# Patient Record
Sex: Male | Born: 1950
Health system: Southern US, Community
[De-identification: ages and names within clinical notes are randomized; demographics above are authoritative.]

## PROBLEM LIST (undated history)

## (undated) ENCOUNTER — Emergency Department (HOSPITAL_COMMUNITY): Admission: EM | Payer: Medicare Other | Source: Home / Self Care

## (undated) DIAGNOSIS — K219 Gastro-esophageal reflux disease without esophagitis: Secondary | ICD-10-CM

## (undated) DIAGNOSIS — G4730 Sleep apnea, unspecified: Secondary | ICD-10-CM

## (undated) DIAGNOSIS — I2510 Atherosclerotic heart disease of native coronary artery without angina pectoris: Secondary | ICD-10-CM

## (undated) DIAGNOSIS — I5032 Chronic diastolic (congestive) heart failure: Secondary | ICD-10-CM

## (undated) DIAGNOSIS — I639 Cerebral infarction, unspecified: Secondary | ICD-10-CM

## (undated) DIAGNOSIS — I252 Old myocardial infarction: Secondary | ICD-10-CM

## (undated) DIAGNOSIS — Z8739 Personal history of other diseases of the musculoskeletal system and connective tissue: Secondary | ICD-10-CM

## (undated) DIAGNOSIS — J449 Chronic obstructive pulmonary disease, unspecified: Secondary | ICD-10-CM

## (undated) DIAGNOSIS — M1990 Unspecified osteoarthritis, unspecified site: Secondary | ICD-10-CM

## (undated) DIAGNOSIS — I5042 Chronic combined systolic (congestive) and diastolic (congestive) heart failure: Secondary | ICD-10-CM

## (undated) DIAGNOSIS — E785 Hyperlipidemia, unspecified: Secondary | ICD-10-CM

## (undated) DIAGNOSIS — E119 Type 2 diabetes mellitus without complications: Secondary | ICD-10-CM

## (undated) DIAGNOSIS — J189 Pneumonia, unspecified organism: Secondary | ICD-10-CM

## (undated) DIAGNOSIS — I10 Essential (primary) hypertension: Secondary | ICD-10-CM

## (undated) DIAGNOSIS — Z87442 Personal history of urinary calculi: Secondary | ICD-10-CM

## (undated) DIAGNOSIS — I471 Supraventricular tachycardia, unspecified: Secondary | ICD-10-CM

## (undated) HISTORY — PX: JOINT REPLACEMENT: SHX530

## (undated) HISTORY — DX: Chronic combined systolic (congestive) and diastolic (congestive) heart failure: I50.42

## (undated) HISTORY — PX: TONSILLECTOMY: SUR1361

## (undated) HISTORY — PX: REVISION TOTAL KNEE ARTHROPLASTY: SHX767

## (undated) HISTORY — PX: CATARACT EXTRACTION, BILATERAL: SHX1313

## (undated) HISTORY — PX: ABDOMINAL SURGERY: SHX537

---

## 1995-05-22 DIAGNOSIS — I252 Old myocardial infarction: Secondary | ICD-10-CM

## 1995-05-22 HISTORY — DX: Old myocardial infarction: I25.2

## 1999-12-22 ENCOUNTER — Encounter: Payer: Self-pay | Admitting: Orthopedic Surgery

## 1999-12-22 ENCOUNTER — Ambulatory Visit (HOSPITAL_COMMUNITY): Admission: RE | Admit: 1999-12-22 | Discharge: 1999-12-22 | Payer: Self-pay | Admitting: Orthopedic Surgery

## 2000-03-22 ENCOUNTER — Ambulatory Visit (HOSPITAL_COMMUNITY): Admission: RE | Admit: 2000-03-22 | Discharge: 2000-03-22 | Payer: Self-pay | Admitting: Cardiology

## 2001-12-19 ENCOUNTER — Emergency Department (HOSPITAL_COMMUNITY): Admission: EM | Admit: 2001-12-19 | Discharge: 2001-12-19 | Payer: Self-pay | Admitting: Emergency Medicine

## 2001-12-19 ENCOUNTER — Encounter: Payer: Self-pay | Admitting: Emergency Medicine

## 2002-05-21 HISTORY — PX: CORONARY STENT PLACEMENT: SHX1402

## 2003-02-10 ENCOUNTER — Encounter: Payer: Self-pay | Admitting: Family Medicine

## 2003-02-10 ENCOUNTER — Encounter: Admission: RE | Admit: 2003-02-10 | Discharge: 2003-02-10 | Payer: Self-pay | Admitting: Family Medicine

## 2003-05-12 ENCOUNTER — Encounter: Admission: RE | Admit: 2003-05-12 | Discharge: 2003-05-12 | Payer: Self-pay | Admitting: Orthopedic Surgery

## 2004-09-17 ENCOUNTER — Encounter: Admission: RE | Admit: 2004-09-17 | Discharge: 2004-09-17 | Payer: Self-pay | Admitting: Cardiology

## 2004-10-03 ENCOUNTER — Ambulatory Visit (HOSPITAL_COMMUNITY): Admission: RE | Admit: 2004-10-03 | Discharge: 2004-10-04 | Payer: Self-pay | Admitting: Cardiology

## 2005-04-17 ENCOUNTER — Encounter (HOSPITAL_COMMUNITY): Admission: RE | Admit: 2005-04-17 | Discharge: 2005-07-16 | Payer: Self-pay | Admitting: Cardiology

## 2005-08-02 ENCOUNTER — Inpatient Hospital Stay (HOSPITAL_COMMUNITY): Admission: RE | Admit: 2005-08-02 | Discharge: 2005-08-07 | Payer: Self-pay | Admitting: Orthopedic Surgery

## 2007-09-25 ENCOUNTER — Encounter: Admission: RE | Admit: 2007-09-25 | Discharge: 2007-09-25 | Payer: Self-pay | Admitting: Orthopedic Surgery

## 2008-01-29 ENCOUNTER — Encounter
Admission: RE | Admit: 2008-01-29 | Discharge: 2008-01-30 | Payer: Self-pay | Admitting: Physical Medicine & Rehabilitation

## 2008-01-30 ENCOUNTER — Ambulatory Visit: Payer: Self-pay | Admitting: Physical Medicine & Rehabilitation

## 2008-03-29 ENCOUNTER — Encounter
Admission: RE | Admit: 2008-03-29 | Discharge: 2008-06-15 | Payer: Self-pay | Admitting: Physical Medicine & Rehabilitation

## 2008-03-29 ENCOUNTER — Ambulatory Visit: Payer: Self-pay | Admitting: Physical Medicine & Rehabilitation

## 2008-05-26 ENCOUNTER — Encounter
Admission: RE | Admit: 2008-05-26 | Discharge: 2008-07-19 | Payer: Self-pay | Admitting: Physical Medicine & Rehabilitation

## 2008-05-27 ENCOUNTER — Ambulatory Visit: Payer: Self-pay | Admitting: Physical Medicine & Rehabilitation

## 2008-06-22 ENCOUNTER — Ambulatory Visit: Payer: Self-pay | Admitting: Physical Medicine & Rehabilitation

## 2008-07-19 ENCOUNTER — Encounter
Admission: RE | Admit: 2008-07-19 | Discharge: 2008-10-17 | Payer: Self-pay | Admitting: Physical Medicine & Rehabilitation

## 2008-07-20 ENCOUNTER — Ambulatory Visit: Payer: Self-pay | Admitting: Physical Medicine & Rehabilitation

## 2008-08-19 ENCOUNTER — Ambulatory Visit: Payer: Self-pay | Admitting: Physical Medicine & Rehabilitation

## 2009-11-11 ENCOUNTER — Ambulatory Visit (HOSPITAL_COMMUNITY): Admission: RE | Admit: 2009-11-11 | Discharge: 2009-11-11 | Payer: Self-pay | Admitting: Orthopedic Surgery

## 2010-10-03 NOTE — Assessment & Plan Note (Signed)
James Maxwell returns to the clinic today for followup evaluation.  He  reports that he is getting good relief from his oxycodone, used 3-4  tablets per day.  He generally uses 4 per day.  He reports his blood  pressure medicines have been unchanged as has been all of his other  medicines.  He does need a refill on his oxycodone over the next few  days.  The patient reports that his pain is still interfering with  sleeping, standing, walking, lifting, and all employment, and traveling  along with sitting.   MEDICATIONS:  1. Enteric-coated aspirin 325 mg daily.  2. Plavix 75 mg daily.  3. Pantoprazole 40 mg daily.  4. Atenolol 100 mg daily.  5. Caduet 10/80 one tablet daily.  6. Lisinopril 10/32 one tablet daily.  7. Potassium chloride 20 mEq daily.  8. Maxzide 75/25 one tablet daily.  9. Oxycodone 5 mg 1 tablet q.i.d. p.r.n. (approximately 4 per day).   REVIEW OF SYSTEMS:  Positive for limb swelling.   PHYSICAL EXAMINATION:  GENERAL:  Well-appearing large adult male in mild-  to-no acute discomfort.  VITAL SIGNS:  Blood pressure 143/75 with a pulse of 61, respiratory rate  18, and O2 saturation 95% on room air.  EXTREMITIES:  He has 4+-5+/5 strength throughout the bilateral upper and  lower extremities.  He ambulates with a single-point cane.   IMPRESSION:  1. Chronic low back pain with evidence of degenerative disk disease of      the lumbar spine per MRI study in May 2009.  2. Cervical degenerative disk disease per MRI study in April 2006.  3. Persistent right knee pain, status post right total knee      replacement.  4. Osteoarthritis of the left knee with potential future surgery.   In the office today, we did refill the patient's oxycodone out of  June 03, 2008.  We will plan on seeing him in followup either with  myself or with the nurses in 1 month's time.           ______________________________  Ellwood Dense, M.D.     DC/MedQ  D:  05/27/2008 10:41:16  T:   05/28/2008 00:23:56  Job #:  [id number]

## 2010-10-03 NOTE — Assessment & Plan Note (Signed)
A 60 year old male with lumbar degenerative disk disease.  He has been  evaluated by Neurosurgery, not felt to be a good surgical candidate.  He  has undergone epidural injections.  He has a history of far right disk  protrusion L2-3 contacting extending right L2 nerve root, similar  situation L3-4 contacting right L3 nerve root.  No displacement,  however.  His main pain is axial spine less so really in the legs.  He  has a history of chronic knee pain bilaterally has undergone a right  total knee replacement.  He states that he has been told by Dr. More  that he may need a left side done as well.   His other past history includes gout which is currently not active.  He  has coronary artery disease, status post stenting 2005 of the right  coronary artery and he has cervical spondylosis C6-7.   He has been monitored at this clinic.  No signs of aberrant drug  behavior.  He has signed a controlled substance agreement.  Last urine  drug screen was performed in September 2009 which was interpreted as  appropriate.   PHYSICAL EXAMINATION:  GENERAL:  No acute distress.  Mood and affect  appropriate.  BACK:  Has some tenderness in the L4-5, L5-S1 paraspinal area.  His  lower extremity strength is normal.  He has reduction and knee flexion  bilaterally, right greater than left.  His hip range of motion is only  mildly reduced and the internal rotation of the hip and the ankle range  of motion is full.  There is no evidence of toe drag or knee instability  with ambulation.   IMPRESSION:  Lumbar axial pain.  His degenerative disks are mainly in  the L2-3, L3-4 areas, but his pain is further down in the L4-5, L5-S1  area.  Given his age, I do suspect he may have some facet overlay and we  will check some medial branch blocks after he comes off the Plavix for 5  days and substitute baby aspirin 81 mg per day.   In the meantime, we will continue his oxycodone 5 mg q.i.d.  Should, we  not make  any positive impact on his pain using the medial branch blocks  may need to consider other agents.  I did explain to him that narcotic  analgesics can really reduce pain by only about 30% and on a chronic  basis and they tend to lose efficacy over time, even if other agents are  trialed.   I will see him back for the injection.  Check and urine drug screen  today, repeat.      Erick Colace, M.D.  Electronically Signed     AEK/MedQ  D:  08/19/2008 12:57:17  T:  08/20/2008 01:40:57  Job #:  [id number]   cc:   Cristi Loron, M.D.  Fax: [phone]

## 2010-10-03 NOTE — Group Therapy Note (Signed)
REFERRAL:  Cristi Loron, MD  Bryan Lemma. Manus Gunning, MD  Lenard Galloway Mortenson, MD.   PURPOSE OF EVALUATION:  Evaluate and treat chronic diffuse pain.   HISTORY OF PRESENT ILLNESS:  Mr. Kreitzer is a 60 year old adult male  referred to this office by Dr. Lovell Sheehan for evaluation and treatment of  chronic low back and bilateral knee pain.   The patient reports that his low back pain started approximately in May  of 2005, when he was working for the [employer] and picked up a  large rock out of a [employer].  He was working for the [employer].  He had acute onset of low back pain at that time.  He reports that the pain is sharp in his back and radiates down the back  of his legs into his great toes bilaterally.   In terms of his knee pain, he reports that he has had a right total knee  replacement in March 2007 with Dr. Chaney Malling.  Since that time, he has  had persistent right knee pain.  Dr. Chaney Malling has explained that the  surgery seems to have gone well, but he has pain and that may be related  to his back.  He has gone on to develop left knee pain but is interested  in no surgery for that problem at the present time.  He was told that he  has significant osteoarthritis in the left knee at the present time.  He  reports that he cannot bend his knees very well and has significant  difficulty with walking and has to use a cane.  X-rays had shown  significant left knee osteoarthritis.   The patient reports that he subsequently was referred to Dr. Lovell Sheehan, a  local neurosurgeon.  An MRI scan was done on Sep 25, 2007, of his lumbar  spine which showed right disk protrusion at L2-L3 which contacts the  exiting right L2 nerve root without displacement.  There were also a  right disk protrusion at L3-L4 which contacts the exiting right L3 nerve  root without displacement.   The patient saw Dr. Lovell Sheehan on December 09, 2007.  At that time, Dr. Lovell Sheehan  reviewed the MRI  scan.  No surgery was recommended with followup on an  as-needed basis.  The patient was referred to physical therapy but  reports that he tried it without benefit and actually had increased  pain.  He was taking hydrocodone and Lyrica at that time prescribed by  Dr. Alvester Morin.  Dr. Alvester Morin subsequently had stopped the Lyrica medications  and started him on tramadol/Tylenol at 37.5/325 one tablet daily.  He  has also been previously treated with hydrocodone by Dr. Chaney Malling but  that was discontinued approximately the spring time of 2009.  He had not  gained much relief from the hydrocodone but had tolerated the  medication.   In the past, when he has had his right total knee replacement, he was  taking Percocet or OxyContin from Dr. Chaney Malling and that did give some  relief and benefitted for the 2 weeks or so that he was using that  medication.  He has really used no other pain medicines other than those  listed above.   The patient reports that he did have injections in his back x3,  specifically epidural steroid injections with Dr. Alvester Morin, but those did  not help and the last one occurred approximately July 2009.   PAST MEDICAL HISTORY:  1. History  of gout affecting his left wrist and right great toe.  2. History of myocardial infarction with angioplasty in 1997 and      stenting in 2005.  3. History of mild cervical spondylosis at C6-C7 per MRI study in      April 2006.  4. History of right total knee replacement in March 2007 by Dr.      Chaney Malling with known osteoarthritis of the left knee.  5. Dyslipidemia.  6. Hypertension.  7. Obesity.   ALLERGIES:  No known drug allergies.   FAMILY HISTORY:  His mother died from stroke and heart disease and his  father died from a motor vehicle accident in his 53s.   SOCIAL HISTORY:  The patient is married with 3 children, ages 64, 46,  and a son aged 38.  He is retired from [employer] in 2008 and reports that he  is on disability at the present  time.  He does not use alcohol or tobacco.   MEDICATIONS:  1. Enteric-coated aspirin 325 mg daily.  2. Plavix 75 mg daily.  3. Pantoprazole 40 mg daily.  4. Atenolol 100 mg daily.  5. Caduet 10/80 one tablet daily.  6. Lisinopril 10/30 two tablets daily.  7. Potassium chloride 20 mEq daily.  8. Tramadol 37.5/325 one tablet daily.  9. Nitroglycerin p.r.n.  10.Triamterene/hydrochlorothiazide 75/25 one tablet daily.   REVIEW OF SYSTEMS:  Positive for stiffness of his hands and fingers.   PHYSICAL EXAMINATION:  This is an ill-appearing larger adult male,  height 5 feet 9 inches, and weight 260 pounds.  Blood pressure is 146/71  with pulse of 52, respiratory rate 18, and O2 saturation 98% on room  air.  He ambulates with a single point cane with significant difficulty  including pain in his back and knees.  Upper extremities range of motion  was within functional limits with complaints of pain with shoulder  abduction and flexion.  Lumbar range of motion was decreased in all  planes.  Strength was 4+/5 throughout the bilateral upper extremities  and bilateral lower extremities.  Bulk and tone were normal.  Straight  leg raise was positive to 20 degrees with slightly decreased hip range  of motion bilaterally.  The patient had a well-healed scar on his right  knee.  He did have decreased sensation of the right lateral aspect of  calf.   DIAGNOSES:  1. Chronic low back pain with evidence of degenerative disk disease of      the lumbar spine per MRI study in May 2009.  2. Cervical degenerative disk disease per MRI study in April 2006.  3. Persistent right knee pain, status post right total knee      replacement.  4. Osteoarthritis of the left knee with potential surgery in the      future.   In the office today, we did start the patient on oxycodone 5 mg to be  used 1 tablet q.i.d. p.r.n. total 120 were prescribed.  He understands  that he needs to  get all pain medicines from this office and comply with  all of our regulations regarding narcotic medications.  We did obtain a  urine drug screen in the office today and he denies any street drug use.  He will be calling in for a refill on a monthly basis.  He knows he  needs to give [place] several days notice before refill is required.  He will  need to pick up  the prescription on a monthly basis, if he is not being  seeing  in the office at that time.  We will plan on seeing him in followup in  this office at approximately 2 months' time with refills in 1 month's  time as needed.           ______________________________  Ellwood Dense, M.D.     DC/MedQ  D:  01/30/2008 16:25:12  T:  01/31/2008 06:51:16  Job #:  [id number]

## 2010-10-03 NOTE — Assessment & Plan Note (Signed)
James Maxwell returns to the clinic today for followup evaluation.  I first  and last saw this patient on January 30, 2008 on referral from Dr.  Lovell Sheehan.  The patient is a 59 year old male with history of chronic low  back and bilateral knee pain.  During last clinic visit, we had decide  to try him on oxycodone 5 mg 1 tablet q.i.d. p.r.n.  He apparently was  somewhat confused about the instructions despite showing me the bottle  today.  It is clearly written 4 times a day as needed.  In any event,  the last refill was January 30, 2008, and we are now at March 29, 2008.  He did not call for a refill in October and has tried to stretch  out the 120 tablets for 8 weeks instead of only 4 weeks.  He has a few  remaining, but has been using the oxycodone only 0-4 tablets per day and  only barely 4 tablets.  He has had some increased pain, but certainly he  has not been using the pain medicine as prescribed and is actually  underusing the medicine.   I did go over explicitly the fact that he can use up to 4 tablets per  day and he seems to have a better understanding.  I also explained to  him that we will be filling it today and then the next refill will be  April 26, 2008, i.e., 1 month minus 2 days from today.  He seems to  have a better understanding in the office today.   REVIEW OF SYSTEMS:  Positive for limb swelling.   MEDICATIONS:  1. Enteric-coated aspirin 325 mg daily.  2. Plavix 75 mg daily.  3. Pantoprazole 40 mg daily.  4. Atenolol 100 mg daily.  5. Caduet 10/80 one tablet daily.  6. Lisinopril 10/32 tablets daily.  7. Potassium chloride 20 mEq daily.  8. Triamterene/hydrochlorothiazide 75/25 one tablet daily.  9. Oxycodone 5 mg 1 tablet daily p.r.n. (0-4 per day).   PHYSICAL EXAMINATION:  GENERAL:  Well-appearing, large, adult male in  moderate acute discomfort.  VITAL SIGNS:  Blood pressure 143/79 with pulse 58, respiratory rate 18,  and O2 saturation 98% on room  air.  He has 4+ to 5/5 strength throughout  the bilateral upper and lower extremities.  He ambulates with a single-  point cane.   IMPRESSION:  1. Chronic low back pain with evidence of degenerative disk disease of      the lumbar spine per MRI study, May 2009.  2. Cervical degenerative disk disease per MRI study, April 2006.  3. Persistent right knee pain, status post right total knee      replacement.  4. Osteoarthritis of the left knee with potential future surgery.   In the office today, I again reviewed the pain medication prescription  for him.  I have emphasized that he can use up to 4 tablets per day and  that we will refill this prescription April 26, 2008, approximately 1  month from today.  He seems to have a good understanding and will be  using the medicine appropriately from here on and out.  We will plan on  seeing the patient in followup in approximately 2 months' time with  refills early December 2009 as necessary.          ______________________________  Ellwood Dense, M.D.    DC/MedQ  D:  03/29/2008 15:09:57  T:  03/30/2008 04:31:30  Job #:  [id number] 

## 2010-10-06 NOTE — Discharge Summary (Signed)
NAME:  James Maxwell, James Maxwell                ACCOUNT NO.:  [id number]   MEDICAL RECORD NO.:  [id number]          PATIENT TYPE:  INP   LOCATION:  5033                         FACILITY:  [facility]   PHYSICIAN:  Lenard Galloway. Mortenson, M.D.DATE OF BIRTH:  July 25, 1950   DATE OF ADMISSION:  08/02/2005  DATE OF DISCHARGE:  08/07/2005                                 DISCHARGE SUMMARY   ADMISSION DIAGNOSIS:  Osteoarthritis of the right knee.   DISCHARGE DIAGNOSES:  1.  Osteoarthritis of the right knee.  2.  History of myocardial infarction.  3.  History of hypertension.  4.  Hypo-osmolality.  5.  Hypokalemia.   PROCEDURE:  Right total knee arthroplasty.   HISTORY OF PRESENT ILLNESS:  This is a 60 year old black male with chronic  bilateral knee pain, right greater than left. He had an arthroscopy with a  partial medial meniscectomy and was noted to have osteoarthritis of the  right knee. He has been having difficulty with stairs and activities of  daily living and has essentially failed conservative treatment.  He is now  indicated for right total knee arthroplasty.   HOSPITAL COURSE:  This 60 year old black male admitted August 02, 2005 after  appropriate laboratory studies were obtained, as well as Ancef 1 gram IV on  call to the operating room. He was taken to the operating room where he  underwent a right total knee arthroplasty. He tolerated the procedure well.  He was placed on a full dose Dilaudid PCA pump.  He was started on Arixtra  2.5 mg SQ at 8 p.m. daily. The first shot was given at 9 p.m. on the day of  surgery.  Foot pumps were placed. CPM zero to 90 degrees for 6-8 hours per  day.  Consults with PT, OT and care management were made. Ambulation with  weight bearing as tolerated. A Foley was placed intra-operatively.   He was weaned to oral pain medications on August 03, 2005 and his PCA was  discontinued. OxyContin 620 mg p.o. q.12 hours was begun.  Potassium  chloride 40 mEq p.o. b.i.d.  was given on August 03, 2005.  Daily dressing  change to the knee. He was given potassium 40 mEq on August 06, 2005 and this  did not raise his potassium to an appropriate level. Once his hypokalemia  was corrected he was discharged on August 07, 2005 to return to the office  with Dr. Chaney Malling on the Friday following discharge.   LABORATORY DATA:  Admission hemoglobin 16.1, hematocrit 47%, white count  8600, platelets 305,000. Discharge hemoglobin 10.0, hematocrit 28.9%, white  count 11,100, platelets 289,000. Postoperative sodium was 140, potassium  3.6, chloride 100, CO2 23, glucose 108, BUN 13, creatinine 1.2, calcium 9.5,  total protein 7.6, albumin 4.0. AST 34, ALT 25, ALP 77, total bilirubin 1.1.  Potassium was 2.9 on August 06, 2005 at 1905 Hr.  Discharge sodium was 138,  potassium 3.4, chloride 95, CO2 34, glucose 130, BUN 14, creatinine 1.3,  calcium 8.5. Magnesium was 2.5.  Urinalysis was benign for a voided urine.  Blood type was A positive,  antibody screen negative.   DISCHARGE INSTRUCTIONS:  No restriction of diet. No driving or lifting x6  weeks. Walk with crutches as weight bearing is tolerated. Keep incision  clean and dry. Cover daily with gel, gauze and an ACE wrap. Follow up  instruction sheet.   DISCHARGE MEDICATIONS:  1.  Prescription for Percocet 5/325 1-2 tablets q.4 hours p.r.n. for pain.  2.  OxyContin CR 20 mg 1 tablet q.12 hours p.r.n. for pain.  3.  Arixtra 2.5 mg injected at 8 p.m. daily as instructed.  4.  Once finished with Arixtra, begin Plavix the next day in the evening,      and start his aspirin also.   CPM zero to 90 degrees x6-8 hours per day. He may increase 5-10 degrees  daily. Follow back up with his family doctor to check his potassium this  week following discharge. Follow up with Dr. Chaney Malling on Friday following  discharge.   Discharge condition improved.      James Maxwell, P.A.-C.    ______________________________  Lenard Galloway  Chaney Malling, M.D.    BDP/MEDQ  D:  10/05/2005  T:  10/05/2005  Job:  [id number]

## 2010-10-06 NOTE — Cardiovascular Report (Signed)
NAME:  James Maxwell, CURRENT NO.:  [id number]   MEDICAL RECORD NO.:  [id number]          PATIENT TYPE:  OIB   LOCATION:  6527                         FACILITY:  [facility]   PHYSICIAN:  Francisca December, M.D.  DATE OF BIRTH:  1950/10/11   DATE OF PROCEDURE:  10/03/2004  DATE OF DISCHARGE:                              CARDIAC CATHETERIZATION   PROCEDURES PERFORMED:  1.  Left heart catheterization.  2.  Coronary angiography.  3.  Left ventriculogram.  4.  PCI/stent implantation proximal right coronary.  5.  Percutaneous closure right femoral artery.   INDICATIONS:  James Maxwell is a 60 year old man with known ASCVD who has  previously undergone catheter-based revascularization of the PLB branch off  the RCA in 1998. He underwent cardiac catheterization in 2001 which was felt  to demonstrate nonobstructive disease. He has represented with atypical  angina. A myocardial perfusion study has shown a reversible inferoposterior  wall defect. He is brought to catheterization laboratory at this time to  identify the extent of disease and provide further therapeutic options.   PROCEDURE NOTE:  The patient was brought to the cardiac catheterization  laboratory in the fasting state. The right groin was prepped and draped in  usual sterile fashion. Local anesthesia was obtained with infiltration of 1%  lidocaine. A 5-French catheter sheath was inserted percutaneously into the  right femoral artery utilizing an approach or a guiding J-wire. Left heart  catheterization and coronary angiography proceed in the standard fashion  using 5-French #4 left Judkins, a 5-French AL-1 for the right coronary, and  a 110 cm pigtail catheter for the left ventriculogram. The patient is known  to have an anterior superior origin of the right coronary. At completion of  coronary angiograms, the cines were reviewed and decision was made to  proceed with the percutaneous treatment of a moderate proximal  right  coronary artery stenosis. The 6-French catheter sheath was exchanged over a  long guiding J-wire for 7-French catheter sheath. The patient received 6000  units of heparin intravenously. As well, he received a double bolus of  Integrilin and constant infusion. The resultant ACT was 331 seconds. A 6-  [place] #1 AL guiding catheter was advanced to the ascending aorta where the  right coronary os was engaged. A 0.014 inches Luge intracoronary guide was  passed across the lesion in the right coronary without difficulty. The  lesion was primarily stented using a 4.0 x 16 mm Scimed Liberte  intracoronary stent. This was carefully placed across the lesion and  inflated to peak pressure of 16 atmospheres for approximately one minute.  The stent balloon was deflated, removed and a 4.0 x 8 mm Scimed Quantum  intracoronary balloon was advanced in place. This was inflated twice a bit  more distal and then slightly proximal to peak pressure of 16 atmospheres  for not greater than 40 seconds. Following confirmation of adequate patency  in orthogonal views both with without the guidewire in place, the guiding  catheter was removed. A right femoral arteriogram the 45 degrees RAO  angulation via the catheter sheath  by hand injection was performed and  documented adequate anatomy for placement of percutaneous closure device  AngioSeal. This was successfully deployed with good hemostasis and intact  distal pulse. The patient is transported to recovery area in stable  condition.   HEMODYNAMIC:  Systemic arterial pressure was 133/79 with a mean of 102 mmHg.  There was no systolic gradient across the aortic valve. Left ventricular end-  diastolic pressure was 13 mmHg pre ventriculogram and unchanged post  ventriculogram.   ANGIOGRAPHY:  The left ventriculogram demonstrated normal chamber size and  normal global systolic function without regional wall motion abnormality. A  visual estimate of the  ejection fraction is 60%. There is no mitral  regurgitation. There is no coronary calcification. The aortic valve is  trileaflet opens normally during systole.   There is a right dominant coronary system present. The main left coronary  was normal.   Left anterior descending artery and its branches showed no significant  obstruction. This was a type 1 vessel barely reaching the apex. There was a  30% stenosis in the midportion.   The left circumflex coronary artery and its branches showed the presence of  a 40% narrowing in the proximal portion of the large obtuse marginal branch.  The first and second marginal branches are quite small.   The right coronary in its branches are highly diseased; as mentioned, there  is an anterior and superior origin to the right coronary which was easily  cannulated with a #1 Amplatz left catheter. There was a tubular proximal  stenosis that in the LAO projection appeared to be 50% and in the RAO  projection appeared to be 70% stenotic. The distal vessel shows luminal  irregularities but no obstruction and bifurcates into large posterior  descending artery which perfuses the apex and large posterolateral segment  which gives rise to three small left ventricular branches and then a  moderate size fourth left ventricular branch. There are no obstructions in  the posterior descending artery and there is a 20-30% stenosis in the  posterolateral segment prior to the origin of the fourth ventricular branch  which was the site of previous angioplasty.   Following balloon dilatation and stent implantation in the proximal right  coronary, there was no residual stenosis.   FINAL IMPRESSION:  1.  Atherosclerotic coronary vascular disease, single vessel.  2.  Status post successful percutaneous coronary intervention/stent      implantation bare-metal proximal right coronary. 3.  Typical angina was reproduced with device insertion and balloon      inflation.   4.  Intact ventricular size and global systolic function, ejection fraction      60%.      JHE/MEDQ  D:  10/03/2004  T:  10/03/2004  Job:  [id number]   cc:   Bryan Lemma. Manus Gunning, M.D.  [address] [place]  [place] [address]  Fax: [phone]

## 2010-10-06 NOTE — Op Note (Signed)
NAME:  James Maxwell, James Maxwell                ACCOUNT NO.:  [id number]   MEDICAL RECORD NO.:  [id number]          PATIENT TYPE:  INP   LOCATION:  5033                         FACILITY:  [facility]   PHYSICIAN:  Lenard Galloway. Mortenson, M.D.DATE OF BIRTH:  04-Apr-1951   DATE OF PROCEDURE:  08/02/2005  DATE OF DISCHARGE:                                 OPERATIVE REPORT   PREOPERATIVE DIAGNOSIS:  Severe osteoarthritis, right knee.   POSTOPERATIVE DIAGNOSIS:  Severe osteoarthritis, right knee.   OPERATION:  Total knee replacement on the right using an MBT keel tibial  tray cemented, size 5, with a 12 mm poly insert, and a large right femoral  component cemented, and a large cemented patella.   SURGEON:  Lenard Galloway. Chaney Malling, M.D.   ASSISTANT:  Legrand Pitts. Duffy, P.A.-C.   ANESTHESIA:  General.   PROCEDURE:  The patient was placed on the operating table in supine  position.  A pneumatic tourniquet was placed about the right upper thigh.  The right lower extremity was prepped with DuraPrep and draped out in the  usual manner.  The leg was wrapped out with an Esmarch and the tourniquet  was elevated.  An incision was made starting above the patella and carried  down to the pubic tubercle.  The skin edges were retracted, bleeders were  coagulated. A long medial parapatellar incision was made and the patella was  everted laterally.  Osteophytes off both sides of the femoral condyles were  debrided and over the medial margin of medial tibial plateau.  The patella  was everted.  Both medial and lateral meniscus were excised.  Part of the  fat pad was removed.  Significant osteoarthritic change was seen about the  knee.  The computer array was placed in the femur and the tibia off two  Shands pins.  Registration was then done, first of the femoral head center,  then the tibial mechanical axis, then we defined the proximal tibial plateau  and then defined the distal, femoral condyles, epicondyles, following all  the computer promptings in the standard manner.  At this point, tibia  resection settings were then entered into the computer using the computer  promptings, soft tissue balancing was then done in flexion and extension,  using the soft tissue balancer.  The medial capsule was stripped off, there  was a great deal of varus deformity to the leg.  A ridge of osteophytes were  taken off the medial side of the tibial plateau.  The entire soft tissue  envelope was stripped off the proximal tibia as a single large unit.  This  lined up the tibia with reference to the femur to about 0 degrees.  Soft  tissue balancing was then done in flexion and extension.  At this point,  final implant planning was then done, registered, and femoral resection was  done following all the computer promptings using the appropriate cutting  jigs.  The proximal tibia had been cut previously in the standard manner and  then the tibia subluxed anteriorly.  A size 5 trial fit very nice and this  was  fixed with fixation pins.  The trial was applied and a drill hole was  placed in the proximal tibia.  The wing shaped broach was then used.  The  computer suggested a 12.5 mm implant and a trial implant was inserted.  The  final femoral trial was selected after the distal end of the femur had been  cut in the appropriate manner as noted above.  The femoral trial was put  over this and the knee reduced.  The knee was put through a full range of  motion.  There was good extension, good flexion, good balancing of all the  ligaments and it was felt the soft tissues were balanced very nicely, both  in flexion and extension.  All the trial components were then removed.  Just  prior to removal of all the components, the posterior aspect of the patella  was amputated and a trial patella was inserted.  There was excellent  tracking of the patella.  At this point, all trial components were removed.  Pulse lavage was used and all bony and  soft tissue debris was removed.  Time  was spent making sure there was no extra debris in the wound.  Glue was  mixed and glue was placed on the posterior aspect of all the final  components.  Glue was placed over the proximal end of the tibia and the  tibial component inserted, excess glue was removed.  The final poly was  placed on the tibial tray.  Glue was placed over the distal end of the femur  and over the posterior aspect of the femoral component and the femoral  component was then driven over the distal end of the femur.  Excess glue was  removed.  This fit very nicely.  The knee was then articulated.  With the  knee in extension, all excess glue was removed.  Glue was placed on the  posterior aspect of the patella and this was placed over the cut surface of  the patella and held in place with a clamp.  Excess glue was removed.  Once  the glue had cured, the knee was flexed and excess glue was removed from  around the components with a small osteotome.  Great care was taken to  remove all glue debris.  The tourniquet was dropped and all bleeders were  coagulated.  Hemostasis was achieved.  The knee was irrigated again with  pulse lavage.  A Hemovac drain was placed.  The long median parapatellar  incision was then closed with interrupted Ethibond sutures.  There was  excellent range of motion of the knee.  AP drawer was negative.  Alignment  was very nice.  Vicryl was used to close the subcutaneous tissue and  stainless steel staples were used to close the skin.  Sterile dressings were  applied and the patient returned to the recovery room in excellent  condition.  Technically, this went extremely well.  Drains one Hemovac.  Complications were none.           ______________________________  Lenard Galloway. Chaney Malling, M.D.     RAM/MEDQ  D:  08/02/2005  T:  08/03/2005  Job:  [id number]

## 2011-05-28 DIAGNOSIS — D126 Benign neoplasm of colon, unspecified: Secondary | ICD-10-CM | POA: Diagnosis not present

## 2011-05-28 DIAGNOSIS — Z86010 Personal history of colonic polyps: Secondary | ICD-10-CM | POA: Diagnosis not present

## 2011-05-28 DIAGNOSIS — Z09 Encounter for follow-up examination after completed treatment for conditions other than malignant neoplasm: Secondary | ICD-10-CM | POA: Diagnosis not present

## 2011-05-28 DIAGNOSIS — K5730 Diverticulosis of large intestine without perforation or abscess without bleeding: Secondary | ICD-10-CM | POA: Diagnosis not present

## 2011-07-04 ENCOUNTER — Other Ambulatory Visit (HOSPITAL_COMMUNITY): Payer: Self-pay | Admitting: Orthopedic Surgery

## 2011-07-04 DIAGNOSIS — M25561 Pain in right knee: Secondary | ICD-10-CM

## 2011-07-04 DIAGNOSIS — Z96659 Presence of unspecified artificial knee joint: Secondary | ICD-10-CM

## 2011-07-12 ENCOUNTER — Ambulatory Visit (HOSPITAL_COMMUNITY)
Admission: RE | Admit: 2011-07-12 | Discharge: 2011-07-12 | Disposition: A | Discharge status: Home / Self Care | Payer: Managed Care, Other (non HMO) | Source: Ambulatory Visit | Attending: Orthopedic Surgery | Admitting: Orthopedic Surgery

## 2011-07-12 ENCOUNTER — Encounter (HOSPITAL_COMMUNITY)
Admission: RE | Admit: 2011-07-12 | Discharge: 2011-07-12 | Disposition: A | Discharge status: Home / Self Care | Payer: Managed Care, Other (non HMO) | Source: Ambulatory Visit | Attending: Orthopedic Surgery | Admitting: Orthopedic Surgery

## 2011-07-12 DIAGNOSIS — M25569 Pain in unspecified knee: Secondary | ICD-10-CM | POA: Insufficient documentation

## 2011-07-12 DIAGNOSIS — M25561 Pain in right knee: Secondary | ICD-10-CM

## 2011-07-12 DIAGNOSIS — Z96659 Presence of unspecified artificial knee joint: Secondary | ICD-10-CM

## 2011-07-12 MED ORDER — TECHNETIUM TC 99M MEDRONATE IV KIT
25.0000 | PACK | Freq: Once | INTRAVENOUS | Status: AC | PRN
  Administered 2011-07-12: 25 via INTRAVENOUS

## 2011-07-27 DIAGNOSIS — M1710 Unilateral primary osteoarthritis, unspecified knee: Secondary | ICD-10-CM | POA: Diagnosis not present

## 2011-08-07 DIAGNOSIS — N529 Male erectile dysfunction, unspecified: Secondary | ICD-10-CM | POA: Diagnosis not present

## 2011-09-18 ENCOUNTER — Other Ambulatory Visit: Payer: Self-pay | Admitting: Orthopedic Surgery

## 2011-09-18 DIAGNOSIS — M545 Low back pain, unspecified: Secondary | ICD-10-CM

## 2011-09-21 ENCOUNTER — Ambulatory Visit
Admission: RE | Admit: 2011-09-21 | Discharge: 2011-09-21 | Disposition: A | Discharge status: Home / Self Care | Payer: Managed Care, Other (non HMO) | Source: Ambulatory Visit | Attending: Orthopedic Surgery | Admitting: Orthopedic Surgery

## 2011-09-21 DIAGNOSIS — M169 Osteoarthritis of hip, unspecified: Secondary | ICD-10-CM | POA: Diagnosis not present

## 2011-09-21 DIAGNOSIS — M545 Low back pain, unspecified: Secondary | ICD-10-CM

## 2011-09-21 DIAGNOSIS — M47817 Spondylosis without myelopathy or radiculopathy, lumbosacral region: Secondary | ICD-10-CM | POA: Diagnosis not present

## 2011-09-21 DIAGNOSIS — M76899 Other specified enthesopathies of unspecified lower limb, excluding foot: Secondary | ICD-10-CM | POA: Diagnosis not present

## 2011-09-21 DIAGNOSIS — M25559 Pain in unspecified hip: Secondary | ICD-10-CM | POA: Diagnosis not present

## 2011-09-21 DIAGNOSIS — M25859 Other specified joint disorders, unspecified hip: Secondary | ICD-10-CM | POA: Diagnosis not present

## 2011-09-21 DIAGNOSIS — M5126 Other intervertebral disc displacement, lumbar region: Secondary | ICD-10-CM | POA: Diagnosis not present

## 2011-10-18 DIAGNOSIS — T84099A Other mechanical complication of unspecified internal joint prosthesis, initial encounter: Secondary | ICD-10-CM | POA: Insufficient documentation

## 2011-10-19 DIAGNOSIS — M25569 Pain in unspecified knee: Secondary | ICD-10-CM | POA: Diagnosis not present

## 2011-10-30 DIAGNOSIS — M546 Pain in thoracic spine: Secondary | ICD-10-CM | POA: Diagnosis not present

## 2011-12-11 DIAGNOSIS — L259 Unspecified contact dermatitis, unspecified cause: Secondary | ICD-10-CM | POA: Diagnosis not present

## 2011-12-18 DIAGNOSIS — E669 Obesity, unspecified: Secondary | ICD-10-CM | POA: Diagnosis not present

## 2011-12-18 DIAGNOSIS — I10 Essential (primary) hypertension: Secondary | ICD-10-CM | POA: Diagnosis not present

## 2011-12-18 DIAGNOSIS — I2510 Atherosclerotic heart disease of native coronary artery without angina pectoris: Secondary | ICD-10-CM | POA: Diagnosis not present

## 2012-03-31 DIAGNOSIS — M25569 Pain in unspecified knee: Secondary | ICD-10-CM | POA: Diagnosis not present

## 2012-03-31 DIAGNOSIS — I2510 Atherosclerotic heart disease of native coronary artery without angina pectoris: Secondary | ICD-10-CM | POA: Diagnosis not present

## 2012-03-31 DIAGNOSIS — Z23 Encounter for immunization: Secondary | ICD-10-CM | POA: Diagnosis not present

## 2012-03-31 DIAGNOSIS — K219 Gastro-esophageal reflux disease without esophagitis: Secondary | ICD-10-CM | POA: Diagnosis not present

## 2012-03-31 DIAGNOSIS — N529 Male erectile dysfunction, unspecified: Secondary | ICD-10-CM | POA: Diagnosis not present

## 2012-03-31 DIAGNOSIS — I10 Essential (primary) hypertension: Secondary | ICD-10-CM | POA: Diagnosis not present

## 2012-03-31 DIAGNOSIS — E119 Type 2 diabetes mellitus without complications: Secondary | ICD-10-CM | POA: Diagnosis not present

## 2012-04-03 DIAGNOSIS — Z96659 Presence of unspecified artificial knee joint: Secondary | ICD-10-CM | POA: Diagnosis not present

## 2012-04-03 DIAGNOSIS — M25569 Pain in unspecified knee: Secondary | ICD-10-CM | POA: Diagnosis not present

## 2012-04-10 DIAGNOSIS — M25569 Pain in unspecified knee: Secondary | ICD-10-CM | POA: Diagnosis not present

## 2012-05-26 DIAGNOSIS — M1710 Unilateral primary osteoarthritis, unspecified knee: Secondary | ICD-10-CM | POA: Diagnosis not present

## 2012-05-26 DIAGNOSIS — I2510 Atherosclerotic heart disease of native coronary artery without angina pectoris: Secondary | ICD-10-CM | POA: Diagnosis not present

## 2012-05-26 DIAGNOSIS — K219 Gastro-esophageal reflux disease without esophagitis: Secondary | ICD-10-CM | POA: Diagnosis not present

## 2012-05-26 DIAGNOSIS — E119 Type 2 diabetes mellitus without complications: Secondary | ICD-10-CM | POA: Diagnosis not present

## 2012-05-26 DIAGNOSIS — Z01818 Encounter for other preprocedural examination: Secondary | ICD-10-CM | POA: Diagnosis not present

## 2012-05-26 DIAGNOSIS — I10 Essential (primary) hypertension: Secondary | ICD-10-CM | POA: Diagnosis not present

## 2012-05-26 DIAGNOSIS — E669 Obesity, unspecified: Secondary | ICD-10-CM | POA: Diagnosis not present

## 2012-06-04 DIAGNOSIS — E119 Type 2 diabetes mellitus without complications: Secondary | ICD-10-CM | POA: Diagnosis present

## 2012-06-04 DIAGNOSIS — M25569 Pain in unspecified knee: Secondary | ICD-10-CM | POA: Diagnosis present

## 2012-06-04 DIAGNOSIS — K219 Gastro-esophageal reflux disease without esophagitis: Secondary | ICD-10-CM | POA: Diagnosis present

## 2012-06-04 DIAGNOSIS — E785 Hyperlipidemia, unspecified: Secondary | ICD-10-CM | POA: Diagnosis present

## 2012-06-04 DIAGNOSIS — M1710 Unilateral primary osteoarthritis, unspecified knee: Secondary | ICD-10-CM | POA: Diagnosis not present

## 2012-06-04 DIAGNOSIS — T84039A Mechanical loosening of unspecified internal prosthetic joint, initial encounter: Secondary | ICD-10-CM | POA: Diagnosis not present

## 2012-06-04 DIAGNOSIS — Z6838 Body mass index (BMI) 38.0-38.9, adult: Secondary | ICD-10-CM | POA: Diagnosis not present

## 2012-06-04 DIAGNOSIS — Z96659 Presence of unspecified artificial knee joint: Secondary | ICD-10-CM | POA: Diagnosis not present

## 2012-06-04 DIAGNOSIS — Z9861 Coronary angioplasty status: Secondary | ICD-10-CM | POA: Diagnosis not present

## 2012-06-04 DIAGNOSIS — T8489XA Other specified complication of internal orthopedic prosthetic devices, implants and grafts, initial encounter: Secondary | ICD-10-CM | POA: Diagnosis present

## 2012-06-04 DIAGNOSIS — I10 Essential (primary) hypertension: Secondary | ICD-10-CM | POA: Diagnosis present

## 2012-06-04 DIAGNOSIS — E669 Obesity, unspecified: Secondary | ICD-10-CM | POA: Diagnosis present

## 2012-06-04 DIAGNOSIS — I2510 Atherosclerotic heart disease of native coronary artery without angina pectoris: Secondary | ICD-10-CM | POA: Diagnosis present

## 2012-06-04 DIAGNOSIS — Z472 Encounter for removal of internal fixation device: Secondary | ICD-10-CM | POA: Diagnosis not present

## 2012-06-04 DIAGNOSIS — T84099A Other mechanical complication of unspecified internal joint prosthesis, initial encounter: Secondary | ICD-10-CM | POA: Diagnosis not present

## 2012-06-23 DIAGNOSIS — T84039A Mechanical loosening of unspecified internal prosthetic joint, initial encounter: Secondary | ICD-10-CM | POA: Insufficient documentation

## 2012-07-21 DIAGNOSIS — M25569 Pain in unspecified knee: Secondary | ICD-10-CM | POA: Diagnosis not present

## 2012-07-21 DIAGNOSIS — Z5189 Encounter for other specified aftercare: Secondary | ICD-10-CM | POA: Diagnosis not present

## 2012-08-24 ENCOUNTER — Emergency Department (HOSPITAL_COMMUNITY): Payer: Managed Care, Other (non HMO)

## 2012-08-24 ENCOUNTER — Encounter (HOSPITAL_COMMUNITY): Payer: Self-pay | Admitting: Emergency Medicine

## 2012-08-24 ENCOUNTER — Emergency Department (HOSPITAL_COMMUNITY)
Admission: EM | Admit: 2012-08-24 | Discharge: 2012-08-24 | Disposition: A | Discharge status: Home / Self Care | Payer: Managed Care, Other (non HMO) | Attending: Emergency Medicine | Admitting: Emergency Medicine

## 2012-08-24 DIAGNOSIS — M48061 Spinal stenosis, lumbar region without neurogenic claudication: Secondary | ICD-10-CM | POA: Diagnosis not present

## 2012-08-24 DIAGNOSIS — M549 Dorsalgia, unspecified: Secondary | ICD-10-CM | POA: Insufficient documentation

## 2012-08-24 DIAGNOSIS — I509 Heart failure, unspecified: Secondary | ICD-10-CM | POA: Insufficient documentation

## 2012-08-24 DIAGNOSIS — N3941 Urge incontinence: Secondary | ICD-10-CM | POA: Diagnosis not present

## 2012-08-24 DIAGNOSIS — Z7902 Long term (current) use of antithrombotics/antiplatelets: Secondary | ICD-10-CM | POA: Diagnosis not present

## 2012-08-24 DIAGNOSIS — Z7982 Long term (current) use of aspirin: Secondary | ICD-10-CM | POA: Diagnosis not present

## 2012-08-24 DIAGNOSIS — I10 Essential (primary) hypertension: Secondary | ICD-10-CM | POA: Insufficient documentation

## 2012-08-24 DIAGNOSIS — M4800 Spinal stenosis, site unspecified: Secondary | ICD-10-CM | POA: Diagnosis not present

## 2012-08-24 DIAGNOSIS — M25559 Pain in unspecified hip: Secondary | ICD-10-CM | POA: Insufficient documentation

## 2012-08-24 DIAGNOSIS — M129 Arthropathy, unspecified: Secondary | ICD-10-CM | POA: Insufficient documentation

## 2012-08-24 DIAGNOSIS — Z9861 Coronary angioplasty status: Secondary | ICD-10-CM | POA: Diagnosis not present

## 2012-08-24 DIAGNOSIS — Z79899 Other long term (current) drug therapy: Secondary | ICD-10-CM | POA: Insufficient documentation

## 2012-08-24 DIAGNOSIS — E119 Type 2 diabetes mellitus without complications: Secondary | ICD-10-CM | POA: Diagnosis not present

## 2012-08-24 DIAGNOSIS — I252 Old myocardial infarction: Secondary | ICD-10-CM | POA: Insufficient documentation

## 2012-08-24 HISTORY — DX: Type 2 diabetes mellitus without complications: E11.9

## 2012-08-24 HISTORY — DX: Old myocardial infarction: I25.2

## 2012-08-24 HISTORY — DX: Unspecified osteoarthritis, unspecified site: M19.90

## 2012-08-24 HISTORY — DX: Essential (primary) hypertension: I10

## 2012-08-24 MED ORDER — HYDROMORPHONE HCL PF 1 MG/ML IJ SOLN
1.0000 mg | Freq: Once | INTRAMUSCULAR | Status: AC
  Administered 2012-08-24: 1 mg via INTRAVENOUS
  Filled 2012-08-24: qty 1

## 2012-08-24 MED ORDER — OXYCODONE-ACETAMINOPHEN 7.5-325 MG PO TABS: 1.0000 | ORAL_TABLET | ORAL | Status: DC | PRN

## 2012-08-24 MED ORDER — HYDROMORPHONE HCL PF 1 MG/ML IJ SOLN: 1.0000 mg | Freq: Once | INTRAMUSCULAR | Status: DC

## 2012-08-24 MED ORDER — HYDROMORPHONE HCL PF 2 MG/ML IJ SOLN
INTRAMUSCULAR | Status: AC
  Administered 2012-08-24: 1 mg
  Filled 2012-08-24: qty 1

## 2012-08-24 MED ORDER — ONDANSETRON HCL 4 MG/2ML IJ SOLN
4.0000 mg | Freq: Once | INTRAMUSCULAR | Status: AC
  Administered 2012-08-24: 4 mg via INTRAVENOUS
  Filled 2012-08-24: qty 2

## 2012-08-24 NOTE — ED Provider Notes (Signed)
History     CSN: [id number]  Arrival date & time 08/24/12  5621   First MD Initiated Contact with Patient 08/24/12 1900      Chief Complaint  Patient presents with  . Hip Pain    bilat hip pain radiating down both legs to knees  . Leg Pain  . Urinary Urgency    Feels like he needs to urinate frequently     HPI 3 day hx of increasing low back, hip and bilat. leg pain.  Patient denies urinary incontinence or retention.  Patient denies numbness or muscle weakness.  Review of patient's previous MRI scan less than a year ago showed spinal stenosis and broad-based herniated disc.  Patient has not seen him back physician for several years.  Past Medical History  Diagnosis Date  . Hypertension   . CHF (congestive heart failure)   . Diabetes mellitus without complication   . Arthritis   . MI, old     Past Surgical History  Procedure Laterality Date  . Coronary stent placement    . Revision total knee arthroplasty    . Tonsillectomy    . Abdominal surgery      Family History  Problem Relation Age of Onset  . Diabetes Mother   . Hypertension Mother     History  Substance Use Topics  . Smoking status: Never Smoker   . Smokeless tobacco: Not on file  . Alcohol Use: No      Review of Systems  All other systems reviewed and are negative.    Allergies  Review of patient's allergies indicates no known allergies.  Home Medications   Current Outpatient Rx  Name  Route  Sig  Dispense  Refill  . amLODipine-atorvastatin (CADUET) 10-80 MG per tablet   Oral   Take 1 tablet by mouth daily.         [name] aspirin EC 325 MG tablet   Oral   Take 325 mg by mouth daily.         [name] atenolol (TENORMIN) 100 MG tablet   Oral   Take 100 mg by mouth daily.         . clopidogrel (PLAVIX) 75 MG tablet   Oral   Take 75 mg by mouth daily.         [name] glimepiride (AMARYL) 2 MG tablet   Oral   Take 2 mg by mouth daily before breakfast.         . lisinopril  (PRINIVIL,ZESTRIL) 10 MG tablet   Oral   Take 10 mg by mouth daily.         . metFORMIN (GLUMETZA) 500 MG (MOD) 24 hr tablet   Oral   Take 1,000 mg by mouth 2 (two) times daily with a meal.         . pantoprazole (PROTONIX) 40 MG tablet   Oral   Take 40 mg by mouth daily.         . potassium chloride SA (K-DUR,KLOR-CON) 20 MEQ tablet   Oral   Take 20 mEq by mouth daily.         [name] oxyCODONE-acetaminophen (PERCOCET) 7.5-325 MG per tablet   Oral   Take 1 tablet by mouth every 4 (four) hours as needed for pain.   30 tablet   0     BP 131/62  Pulse 72  Temp(Src) 97.3 F (36.3 C) (Oral)  Resp 18  SpO2 95%  Physical Exam  Nursing note and vitals reviewed.  Constitutional: He is oriented to person, place, and time. He appears well-developed and well-nourished. No distress.  HENT:  Head: Normocephalic and atraumatic.  Eyes: Pupils are equal, round, and reactive to light.  Neck: Normal range of motion.  Cardiovascular: Normal rate and intact distal pulses.   Pulmonary/Chest: No respiratory distress.  Abdominal: Normal appearance. He exhibits no distension.  Musculoskeletal:       Lumbar back: He exhibits decreased range of motion, tenderness and pain. He exhibits no swelling and no deformity.       Back:  Neurological: He is alert and oriented to person, place, and time. No cranial nerve deficit.  Skin: Skin is warm and dry. No rash noted.  Psychiatric: He has a normal mood and affect. His behavior is normal.    ED Course  Procedures (including critical care time) Meds ordered this encounter  Medications  . HYDROmorphone (DILAUDID) injection 1 mg    Sig:   . ondansetron (ZOFRAN) injection 4 mg    Sig:   . oxyCODONE-acetaminophen (PERCOCET) 7.5-325 MG per tablet    Sig: Take 1 tablet by mouth every 4 (four) hours as needed for pain.    Dispense:  30 tablet    Refill:  0     Labs Reviewed - No data to display No results found.   1. Spinal stenosis        MDM  I expect his back problems or result of his spinal stenosis along with his inability to walk normally because of his abnormal knee replacements.       Nelia Shi, MD 08/27/12 [phone]

## 2012-08-24 NOTE — ED Notes (Signed)
3 day hx of increasing low back, hip and bilat. leg pain. Reports frequency and urgency on urination last 24 hrs

## 2012-09-09 DIAGNOSIS — M545 Low back pain: Secondary | ICD-10-CM | POA: Diagnosis not present

## 2012-09-09 DIAGNOSIS — IMO0002 Reserved for concepts with insufficient information to code with codable children: Secondary | ICD-10-CM | POA: Diagnosis not present

## 2012-09-16 DIAGNOSIS — IMO0002 Reserved for concepts with insufficient information to code with codable children: Secondary | ICD-10-CM | POA: Diagnosis not present

## 2012-09-16 DIAGNOSIS — M545 Low back pain: Secondary | ICD-10-CM | POA: Diagnosis not present

## 2012-09-18 DIAGNOSIS — M545 Low back pain: Secondary | ICD-10-CM | POA: Diagnosis not present

## 2012-09-18 DIAGNOSIS — IMO0002 Reserved for concepts with insufficient information to code with codable children: Secondary | ICD-10-CM | POA: Diagnosis not present

## 2012-09-19 DIAGNOSIS — IMO0002 Reserved for concepts with insufficient information to code with codable children: Secondary | ICD-10-CM | POA: Diagnosis not present

## 2012-09-19 DIAGNOSIS — M545 Low back pain: Secondary | ICD-10-CM | POA: Diagnosis not present

## 2012-09-23 DIAGNOSIS — IMO0002 Reserved for concepts with insufficient information to code with codable children: Secondary | ICD-10-CM | POA: Diagnosis not present

## 2012-09-23 DIAGNOSIS — M545 Low back pain: Secondary | ICD-10-CM | POA: Diagnosis not present

## 2012-09-25 DIAGNOSIS — M545 Low back pain: Secondary | ICD-10-CM | POA: Diagnosis not present

## 2012-09-25 DIAGNOSIS — IMO0002 Reserved for concepts with insufficient information to code with codable children: Secondary | ICD-10-CM | POA: Diagnosis not present

## 2012-09-29 DIAGNOSIS — IMO0002 Reserved for concepts with insufficient information to code with codable children: Secondary | ICD-10-CM | POA: Diagnosis not present

## 2012-09-29 DIAGNOSIS — M545 Low back pain: Secondary | ICD-10-CM | POA: Diagnosis not present

## 2012-09-30 DIAGNOSIS — M545 Low back pain: Secondary | ICD-10-CM | POA: Diagnosis not present

## 2012-09-30 DIAGNOSIS — IMO0002 Reserved for concepts with insufficient information to code with codable children: Secondary | ICD-10-CM | POA: Diagnosis not present

## 2012-10-02 DIAGNOSIS — R42 Dizziness and giddiness: Secondary | ICD-10-CM | POA: Diagnosis not present

## 2012-10-02 DIAGNOSIS — M533 Sacrococcygeal disorders, not elsewhere classified: Secondary | ICD-10-CM | POA: Diagnosis not present

## 2012-10-02 DIAGNOSIS — G541 Lumbosacral plexus disorders: Secondary | ICD-10-CM | POA: Diagnosis not present

## 2012-10-02 DIAGNOSIS — M542 Cervicalgia: Secondary | ICD-10-CM | POA: Diagnosis not present

## 2012-10-02 DIAGNOSIS — H81399 Other peripheral vertigo, unspecified ear: Secondary | ICD-10-CM | POA: Diagnosis not present

## 2012-10-02 DIAGNOSIS — M545 Low back pain: Secondary | ICD-10-CM | POA: Diagnosis not present

## 2012-10-02 DIAGNOSIS — R209 Unspecified disturbances of skin sensation: Secondary | ICD-10-CM | POA: Diagnosis not present

## 2012-10-02 DIAGNOSIS — H8190 Unspecified disorder of vestibular function, unspecified ear: Secondary | ICD-10-CM | POA: Diagnosis not present

## 2012-10-02 DIAGNOSIS — M629 Disorder of muscle, unspecified: Secondary | ICD-10-CM | POA: Diagnosis not present

## 2012-10-07 DIAGNOSIS — M545 Low back pain: Secondary | ICD-10-CM | POA: Diagnosis not present

## 2012-10-07 DIAGNOSIS — IMO0002 Reserved for concepts with insufficient information to code with codable children: Secondary | ICD-10-CM | POA: Diagnosis not present

## 2012-10-09 DIAGNOSIS — IMO0002 Reserved for concepts with insufficient information to code with codable children: Secondary | ICD-10-CM | POA: Diagnosis not present

## 2012-10-09 DIAGNOSIS — M545 Low back pain: Secondary | ICD-10-CM | POA: Diagnosis not present

## 2012-10-10 DIAGNOSIS — M545 Low back pain: Secondary | ICD-10-CM | POA: Diagnosis not present

## 2012-10-10 DIAGNOSIS — IMO0002 Reserved for concepts with insufficient information to code with codable children: Secondary | ICD-10-CM | POA: Diagnosis not present

## 2012-10-17 DIAGNOSIS — IMO0002 Reserved for concepts with insufficient information to code with codable children: Secondary | ICD-10-CM | POA: Diagnosis not present

## 2012-10-17 DIAGNOSIS — M545 Low back pain: Secondary | ICD-10-CM | POA: Diagnosis not present

## 2012-10-20 DIAGNOSIS — Z79899 Other long term (current) drug therapy: Secondary | ICD-10-CM | POA: Diagnosis not present

## 2012-10-20 DIAGNOSIS — H8190 Unspecified disorder of vestibular function, unspecified ear: Secondary | ICD-10-CM | POA: Diagnosis not present

## 2012-10-20 DIAGNOSIS — G541 Lumbosacral plexus disorders: Secondary | ICD-10-CM | POA: Diagnosis not present

## 2012-10-20 DIAGNOSIS — M545 Low back pain: Secondary | ICD-10-CM | POA: Diagnosis not present

## 2012-10-20 DIAGNOSIS — H81399 Other peripheral vertigo, unspecified ear: Secondary | ICD-10-CM | POA: Diagnosis not present

## 2012-10-21 DIAGNOSIS — M545 Low back pain: Secondary | ICD-10-CM | POA: Diagnosis not present

## 2012-10-21 DIAGNOSIS — IMO0002 Reserved for concepts with insufficient information to code with codable children: Secondary | ICD-10-CM | POA: Diagnosis not present

## 2012-10-22 DIAGNOSIS — M545 Low back pain: Secondary | ICD-10-CM | POA: Diagnosis not present

## 2012-10-22 DIAGNOSIS — IMO0002 Reserved for concepts with insufficient information to code with codable children: Secondary | ICD-10-CM | POA: Diagnosis not present

## 2012-10-23 DIAGNOSIS — M545 Low back pain: Secondary | ICD-10-CM | POA: Diagnosis not present

## 2012-10-23 DIAGNOSIS — IMO0002 Reserved for concepts with insufficient information to code with codable children: Secondary | ICD-10-CM | POA: Diagnosis not present

## 2013-01-29 DIAGNOSIS — Z23 Encounter for immunization: Secondary | ICD-10-CM | POA: Diagnosis not present

## 2013-02-03 ENCOUNTER — Other Ambulatory Visit: Payer: Self-pay | Admitting: Family Medicine

## 2013-02-03 ENCOUNTER — Ambulatory Visit: Payer: Managed Care, Other (non HMO) | Admitting: Family Medicine

## 2013-02-03 VITALS — BP 110/70 | HR 70 | Temp 98.5°F | Resp 18 | Ht 70.0 in | Wt 252.0 lb

## 2013-02-03 DIAGNOSIS — N476 Balanoposthitis: Secondary | ICD-10-CM

## 2013-02-03 DIAGNOSIS — E119 Type 2 diabetes mellitus without complications: Secondary | ICD-10-CM

## 2013-02-03 DIAGNOSIS — R739 Hyperglycemia, unspecified: Secondary | ICD-10-CM

## 2013-02-03 DIAGNOSIS — R7309 Other abnormal glucose: Secondary | ICD-10-CM

## 2013-02-03 DIAGNOSIS — N481 Balanitis: Secondary | ICD-10-CM

## 2013-02-03 LAB — POCT URINALYSIS DIPSTICK
Blood, UA: NEGATIVE
Leukocytes, UA: NEGATIVE
Protein, UA: NEGATIVE
Spec Grav, UA: <=1.005
Urobilinogen, UA: 0.2
pH, UA: 5

## 2013-02-03 LAB — POCT CBC
Granulocyte percent: 64 %G (ref 37–80)
HCT, POC: 47.5 % (ref 43.5–53.7)
Lymph, poc: 2.7 (ref 0.6–3.4)
MCHC: 32.6 g/dL (ref 31.8–35.4)
MCV: 89.2 fL (ref 80–97)
MPV: 10.6 fL (ref 0–99.8)
POC Granulocyte: 5.4 (ref 2–6.9)
POC LYMPH PERCENT: 31.2 %L (ref 10–50)
RDW, POC: 14.2 %
WBC: 8.5 10*3/uL (ref 4.6–10.2)

## 2013-02-03 LAB — COMPREHENSIVE METABOLIC PANEL
ALT: 12 U/L (ref 0–53)
AST: 20 U/L (ref 0–37)
BUN: 17 mg/dL (ref 6–23)
CO2: 26 mEq/L (ref 19–32)
Creat: 1.31 mg/dL (ref 0.50–1.35)
Potassium: 5.1 mEq/L (ref 3.5–5.3)
Sodium: 131 mEq/L — ABNORMAL LOW (ref 135–145)

## 2013-02-03 LAB — POCT UA - MICROSCOPIC ONLY: Crystals, Ur, HPF, POC: NEGATIVE

## 2013-02-03 LAB — GLUCOSE, POCT (MANUAL RESULT ENTRY)
POC Glucose: >=444 mg/dl (ref 70–99)
POC Glucose: 439 mg/dl — AB (ref 70–99)

## 2013-02-03 MED ORDER — MICONAZOLE NITRATE 2 % EX CREA: TOPICAL_CREAM | Freq: Two times a day (BID) | CUTANEOUS | Status: DC

## 2013-02-03 MED ORDER — METFORMIN HCL 1000 MG PO TABS: 1000.0000 mg | ORAL_TABLET | Freq: Two times a day (BID) | ORAL | Status: DC

## 2013-02-03 NOTE — Patient Instructions (Addendum)
Follow up in the urgent care on Thursday and see Dr. Neva Seat between 8 and 2pm.  Record you blood sugar frequently and bring the results with you.  If your blood sugar is below 150 before bed you should eat something tonight.    Apply the cream as directed for two weeks.Hyperglycemia Hyperglycemia occurs when the glucose (sugar) in your blood is too high. Hyperglycemia can happen for many reasons, but it most often happens to people who do not know they have diabetes or are not managing their diabetes properly.  CAUSES  Whether you have diabetes or not, there are other causes of hyperglycemia. Hyperglycemia can occur when you have diabetes, but it can also occur in other situations that you might not be as aware of, such as: Diabetes  If you have diabetes and are having problems controlling your blood glucose, hyperglycemia could occur because of some of the following reasons:  Not following your meal plan.  Not taking your diabetes medications or not taking it properly.  Exercising less or doing less activity than you normally do.  Being sick. Pre-diabetes  This cannot be ignored. Before people develop Type 2 diabetes, they almost always have "pre-diabetes." This is when your blood glucose levels are higher than normal, but not yet high enough to be diagnosed as diabetes. Research has shown that some long-term damage to the body, especially the heart and circulatory system, may already be occurring during pre-diabetes. If you take action to manage your blood glucose when you have pre-diabetes, you may delay or prevent Type 2 diabetes from developing. Stress  If you have diabetes, you may be "diet" controlled or on oral medications or insulin to control your diabetes. However, you may find that your blood glucose is higher than usual in the hospital whether you have diabetes or not. This is often referred to as "stress hyperglycemia." Stress can elevate your blood glucose. This happens because  of hormones put out by the body during times of stress. If stress has been the cause of your high blood glucose, it can be followed regularly by your caregiver. That way he/she can make sure your hyperglycemia does not continue to get worse or progress to diabetes. Steroids  Steroids are medications that act on the infection fighting system (immune system) to block inflammation or infection. One side effect can be a rise in blood glucose. Most people can produce enough extra insulin to allow for this rise, but for those who cannot, steroids make blood glucose levels go even higher. It is not unusual for steroid treatments to "uncover" diabetes that is developing. It is not always possible to determine if the hyperglycemia will go away after the steroids are stopped. A special blood test called an A1c is sometimes done to determine if your blood glucose was elevated before the steroids were started. SYMPTOMS  Thirsty.  Frequent urination.  Dry mouth.  Blurred vision.  Tired or fatigue.  Weakness.  Sleepy.  Tingling in feet or leg. DIAGNOSIS  Diagnosis is made by monitoring blood glucose in one or all of the following ways:  A1c test. This is a chemical found in your blood.  Fingerstick blood glucose monitoring.  Laboratory results. TREATMENT  First, knowing the cause of the hyperglycemia is important before the hyperglycemia can be treated. Treatment may include, but is not be limited to:  Education.  Change or adjustment in medications.  Change or adjustment in meal plan.  Treatment for an illness, infection, etc.  More frequent  blood glucose monitoring.  Change in exercise plan.  Decreasing or stopping steroids.  Lifestyle changes. HOME CARE INSTRUCTIONS   Test your blood glucose as directed.  Exercise regularly. Your caregiver will give you instructions about exercise. Pre-diabetes or diabetes which comes on with stress is helped by exercising.  Eat wholesome,  balanced meals. Eat often and at regular, fixed times. Your caregiver or nutritionist will give you a meal plan to guide your sugar intake.  Being at an ideal weight is important. If needed, losing as little as 10 to 15 pounds may help improve blood glucose levels. SEEK MEDICAL CARE IF:   You have questions about medicine, activity, or diet.  You continue to have symptoms (problems such as increased thirst, urination, or weight gain). SEEK IMMEDIATE MEDICAL CARE IF:   You are vomiting or have diarrhea.  Your breath smells fruity.  You are breathing faster or slower.  You are very sleepy or incoherent.  You have numbness, tingling, or pain in your feet or hands.  You have chest pain.  Your symptoms get worse even though you have been following your caregiver's orders.  If you have any other questions or concerns. Document Released: 10/31/2000 Document Revised: 07/30/2011 Document Reviewed: 09/03/2011 [facility] Patient Information 2014 [place], [place]. Balanitis Balanitis is an common infection of the head (glans) of the penis. CAUSES  Balanitis has multiple causes. Frequently balanitis is the result of poor personal hygiene. Especially if no circumcision has been done. Without adequate washing, many different kinds of germs (viruses, bacteria, and yeast) collect between the foreskin and the glans. This can cause an infection. Lack of air and irritation from a normal secretion called smegma contribute to the cause in uncircumcised males. Other causes include chemical irritation by certain soaps (especially soaps with perfumes). When no circumcision has been done, a frequent cause of poor hygiene is that the tip of the foreskin is tight (phimosis) and cannot be pulled back for adequate washing. Illnesses in other areas of the body can also cause balanitis. This includes illnesses that cause water retention and swelling, such as:  Heart failure.  Cirrhosis of the liver.  Kidney  problems. Other contributing causes include:  Obesity.  Certain allergies to drugs such as tetracycline and sulfa.  Diabetes. SYMPTOMS  Symptoms may include:  Discharge coming from under the foreskin.  Tenderness.  Itching and inability to get an erection (because of the pain).  Redness and a rash is frequently seen.  If the problem remains for a while, sores can be seen on the glans and on the foreskin. If the condition is not treated other complications such as a scar of the opening to the urethra (tube that carries the urine out from the bladder) can occur and block the bladder. This narrowing is called meatal stenosis. Other problems can occur such as:  Infection of the lymph nodes in the crease of the groin.  Ballooning of the foreskin when voiding (when the foreskin opening has scarred down and been made smaller).  Blockage of the bladder.  Frequent urinary infections occur in children with balanitis. HOME CARE INSTRUCTIONS   Pull back foreskin to urinate and when washing.  Pull back foreskin when putting medication on the affected area to prevent the foreskin from swelling and being trapped behind the head.  Keep foreskin and glans clean and dry.  Sitz baths may be helpful.  Take your medication as directed .  Pain medication, if needed.  Circumcision (may be recommended). SEEK IMMEDIATE  MEDICAL CARE IF:   The affected area becomes trapped behind the head.  You start a fever.  The swelling increases. Document Released: 09/23/2008 Document Revised: 07/30/2011 Document Reviewed: 09/23/2008 [facility] Patient Information 2014 [place], [place].

## 2013-02-03 NOTE — Progress Notes (Signed)
Subjective:    Patient ID: James Maxwell, male    DOB: 12-08-1950, 62 y.o.   MRN: [id number]  HPI 62 yo male with complaint of penile swelling and discomfort, onset 2 weeks ago.  Progressively worsening.  History of this once before when he was about 20.  No fever or chills.  No dysuria.  No radiation of pain.  Unsure of inciting event.  Patient is a NIDDM.  One month ago ran out of metformin and has only sporadically taken family members medication since that time.  He has appointment scheduled with new PCP on 02/13/13.    Past Medical History  Diagnosis Date  . Hypertension   . CHF (congestive heart failure)   . Diabetes mellitus without complication   . Arthritis   . MI, old    Past Surgical History  Procedure Laterality Date  . Coronary stent placement    . Revision total knee arthroplasty    . Tonsillectomy    . Abdominal surgery        Review of Systems  Constitutional: Negative for fever and chills.  Respiratory: Negative for cough and shortness of breath.   Cardiovascular: Negative for chest pain and leg swelling.  Gastrointestinal: Negative for nausea, vomiting, abdominal pain, diarrhea and constipation.  Genitourinary: Positive for penile pain. Negative for dysuria, frequency, hematuria, flank pain, discharge and difficulty urinating.  Musculoskeletal: Negative for back pain.  Neurological: Negative for syncope, weakness and headaches.       Objective:   Physical Exam Blood pressure 110/70, pulse 70, temperature 98.5 F (36.9 C), temperature source Oral, resp. rate 18, height 5\' 10"  (1.778 m), weight 252 lb (114.306 kg), SpO2 97.00%. Body mass index is 36.16 kg/(m^2). Well-developed, well nourished male who is awake, alert and oriented, in NAD. HEENT: [place]/AT, PERRL, EOMI.  Sclera and conjunctiva are clear.  EAC are patent, TMs are normal in appearance. Nasal mucosa is pink and moist. OP is clear. Neck: supple, non-tender, no lymphadenopathy, thyromegaly. Heart: RRR,  no murmur Lungs: normal effort, CTA Abdomen: normo-active bowel sounds, supple, non-tender, no mass or organomegaly, laparotomy scar. GU:  balanopishitis Extremities: no cyanosis, clubbing or edema. Skin: warm and dry without rash. Psychologic: good mood and appropriate affect, normal speech and behavior.  Results for orders placed in visit on 02/03/13  POCT URINALYSIS DIPSTICK      Result Value Range   Color, UA yellow     Clarity, UA clear     Glucose, UA >=1000     Bilirubin, UA neg     Ketones, UA trace     Spec Grav, UA <=1.005     Blood, UA neg     pH, UA 5.0     Protein, UA neg     Urobilinogen, UA 0.2     Nitrite, UA neg     Leukocytes, UA Negative    POCT UA - MICROSCOPIC ONLY      Result Value Range   WBC, Ur, HPF, POC 0-1     RBC, urine, microscopic neg     Bacteria, U Microscopic neg     Mucus, UA neg     Epithelial cells, urine per micros neg     Crystals, Ur, HPF, POC neg     Casts, Ur, LPF, POC neg     Yeast, UA neg    GLUCOSE, POCT (MANUAL RESULT ENTRY)      Result Value Range   POC Glucose >=444  70 - 99 mg/dl  POCT  CBC      Result Value Range   WBC 8.5  4.6 - 10.2 K/uL   Lymph, poc 2.7  0.6 - 3.4   POC LYMPH PERCENT 31.2  10 - 50 %L   MID (cbc) 0.4  0 - 0.9   POC MID % 4.8  0 - 12 %M   POC Granulocyte 5.4  2 - 6.9   Granulocyte percent 64.0  37 - 80 %G   RBC 5.33  4.69 - 6.13 M/uL   Hemoglobin 15.5  14.1 - 18.1 g/dL   HCT, POC 40.9  81.1 - 53.7 %   MCV 89.2  80 - 97 fL   MCH, POC 29.1  27 - 31.2 pg   MCHC 32.6  31.8 - 35.4 g/dL   RDW, POC 91.4     Platelet Count, POC 268  142 - 424 K/uL   MPV 10.6  0 - 99.8 fL   HgB A1C 11.9    Follow up blood sugar after 2LN 439 Assessment & Plan:  1. Diabetic hyperglycemia - IVF given in urgent care. Patient given 2 LNS and then given 4 units of SQ Novolog.  Instructed to follow up in 2 days with Dr. Neva Seat for a recheck and to bring record of glucometer readings.  Patient also to follow up with new PCP  on 9/26.  He was given a new metformin script today in the office as he has been taking family members.  2. Balanopithitis - miconizol cream.

## 2013-02-04 NOTE — Progress Notes (Signed)
Patient discussed with Dr. Lorri Frederick. Agree with assessment and plan of care per his note, including close follow up.  Received lab phone call on night of visit. Including normal CO2. Plan on recheck with me as discussed. Results for orders placed in visit on 02/03/13  COMPREHENSIVE METABOLIC PANEL      Result Value Range   Sodium 131 (*) 135 - 145 mEq/L   Potassium 5.1  3.5 - 5.3 mEq/L   Chloride 94 (*) 96 - 112 mEq/L   CO2 26  19 - 32 mEq/L   Glucose, Bld 515 (*) 70 - 99 mg/dL   BUN 17  6 - 23 mg/dL   Creat 4.09  8.11 - 9.14 mg/dL   Total Bilirubin 1.0  0.3 - 1.2 mg/dL   Alkaline Phosphatase 99  39 - 117 U/L   AST 20  0 - 37 U/L   ALT 12  0 - 53 U/L   Total Protein 7.6  6.0 - 8.3 g/dL   Albumin 4.3  3.5 - 5.2 g/dL   Calcium 9.9  8.4 - 78.2 mg/dL  POCT URINALYSIS DIPSTICK      Result Value Range   Color, UA yellow     Clarity, UA clear     Glucose, UA >=1000     Bilirubin, UA neg     Ketones, UA trace     Spec Grav, UA <=1.005     Blood, UA neg     pH, UA 5.0     Protein, UA neg     Urobilinogen, UA 0.2     Nitrite, UA neg     Leukocytes, UA Negative    POCT UA - MICROSCOPIC ONLY      Result Value Range   WBC, Ur, HPF, POC 0-1     RBC, urine, microscopic neg     Bacteria, U Microscopic neg     Mucus, UA neg     Epithelial cells, urine per micros neg     Crystals, Ur, HPF, POC neg     Casts, Ur, LPF, POC neg     Yeast, UA neg    GLUCOSE, POCT (MANUAL RESULT ENTRY)      Result Value Range   POC Glucose >=444  70 - 99 mg/dl  POCT CBC      Result Value Range   WBC 8.5  4.6 - 10.2 K/uL   Lymph, poc 2.7  0.6 - 3.4   POC LYMPH PERCENT 31.2  10 - 50 %L   MID (cbc) 0.4  0 - 0.9   POC MID % 4.8  0 - 12 %M   POC Granulocyte 5.4  2 - 6.9   Granulocyte percent 64.0  37 - 80 %G   RBC 5.33  4.69 - 6.13 M/uL   Hemoglobin 15.5  14.1 - 18.1 g/dL   HCT, POC 95.6  21.3 - 53.7 %   MCV 89.2  80 - 97 fL   MCH, POC 29.1  27 - 31.2 pg   MCHC 32.6  31.8 - 35.4 g/dL   RDW, POC 08.6      Platelet Count, POC 268  142 - 424 K/uL   MPV 10.6  0 - 99.8 fL  POCT GLYCOSYLATED HEMOGLOBIN (HGB A1C)      Result Value Range   Hemoglobin A1C 11.9    GLUCOSE, POCT (MANUAL RESULT ENTRY)      Result Value Range   POC Glucose 439 (*) 70 - 99 mg/dl

## 2013-04-30 ENCOUNTER — Ambulatory Visit: Payer: Managed Care, Other (non HMO) | Admitting: Interventional Cardiology

## 2013-04-30 ENCOUNTER — Encounter: Payer: Self-pay | Admitting: Interventional Cardiology

## 2013-04-30 DIAGNOSIS — M1990 Unspecified osteoarthritis, unspecified site: Secondary | ICD-10-CM | POA: Insufficient documentation

## 2013-04-30 DIAGNOSIS — I509 Heart failure, unspecified: Secondary | ICD-10-CM | POA: Insufficient documentation

## 2013-04-30 DIAGNOSIS — I252 Old myocardial infarction: Secondary | ICD-10-CM | POA: Insufficient documentation

## 2013-04-30 DIAGNOSIS — E119 Type 2 diabetes mellitus without complications: Secondary | ICD-10-CM | POA: Insufficient documentation

## 2013-04-30 DIAGNOSIS — I10 Essential (primary) hypertension: Secondary | ICD-10-CM | POA: Insufficient documentation

## 2013-05-01 ENCOUNTER — Encounter: Payer: Self-pay | Admitting: Interventional Cardiology

## 2013-05-01 ENCOUNTER — Ambulatory Visit (INDEPENDENT_AMBULATORY_CARE_PROVIDER_SITE_OTHER): Payer: Medicare Other | Admitting: Interventional Cardiology

## 2013-05-01 VITALS — BP 130/82 | HR 63 | Ht 69.0 in | Wt 256.0 lb

## 2013-05-01 DIAGNOSIS — Z01810 Encounter for preprocedural cardiovascular examination: Secondary | ICD-10-CM | POA: Diagnosis not present

## 2013-05-01 DIAGNOSIS — I2510 Atherosclerotic heart disease of native coronary artery without angina pectoris: Secondary | ICD-10-CM | POA: Diagnosis not present

## 2013-05-01 DIAGNOSIS — I252 Old myocardial infarction: Secondary | ICD-10-CM

## 2013-05-01 DIAGNOSIS — I10 Essential (primary) hypertension: Secondary | ICD-10-CM

## 2013-05-01 MED ORDER — ASPIRIN EC 81 MG PO TBEC: 81.0000 mg | DELAYED_RELEASE_TABLET | Freq: Every day | ORAL | Status: DC

## 2013-05-01 MED ORDER — NITROGLYCERIN 0.4 MG SL SUBL: 0.4000 mg | SUBLINGUAL_TABLET | SUBLINGUAL | Status: DC | PRN

## 2013-05-01 NOTE — Progress Notes (Signed)
33Patient ID: James Maxwell, male   DOB: 23-Jan-1951, 61 y.o.   MRN: [id number]    [address] [place], [place]  [address] Phone: [phone] Fax:  [phone]  Date:  05/01/2013   ID:  SHERMAINE BRIGHAM, DOB August 16, 1950, MRN [id number]  PCP:  Luster Landsberg, NP      History of Present Illness: James Maxwell is a 62 y.o. male who has CAD.  He had chest pain prior to RCA stent.  None since. He no longer walks a mile daily due to knee problems. He is looking at getting a knee replacement this month. He had lost 20 lbs. He has taken off a little more weight.  He had his right knee replaced earlier in 2014.  No cardiac complications at that time.  The knee is not as painfree as he would have liked.  CAD/ASCVD:  c/o Leg edema right knee swelling.  Denies : Chest pain.  Diaphoresis.  Dizziness.  Dyspnea on exertion.  Fatigue.  Nitroglycerin.  Orthopnea.  Palpitations.  Paroxysmal nocturnal dyspnea.     Wt Readings from Last 3 Encounters:  05/01/13 256 lb (116.121 kg)  02/03/13 252 lb (114.306 kg)     Past Medical History  Diagnosis Date  . Hypertension   . CHF (congestive heart failure)   . Diabetes mellitus without complication   . Arthritis   . MI, old     Current Outpatient Prescriptions  Medication Sig Dispense Refill  . amLODipine-atorvastatin (CADUET) 10-80 MG per tablet Take 1 tablet by mouth daily.      [name] aspirin EC 325 MG tablet Take 325 mg by mouth daily.      [name] atenolol (TENORMIN) 100 MG tablet Take 100 mg by mouth daily.      . Canagliflozin (INVOKANA) 100 MG TABS Take 100 mg by mouth daily.      . clopidogrel (PLAVIX) 75 MG tablet Take 75 mg by mouth daily.      [name] lisinopril (PRINIVIL,ZESTRIL) 10 MG tablet Take 10 mg by mouth as directed. 3 tablet daily= 30 mg daily      . metFORMIN (GLUCOPHAGE) 1000 MG tablet Take 1 tablet (1,000 mg total) by mouth 2 (two) times daily with a meal.  180 tablet  3  . miconazole (MICATIN) 2 % cream Apply topically 2  (two) times daily.  28.35 g  0  . pantoprazole (PROTONIX) 40 MG tablet Take 40 mg by mouth daily.      . potassium chloride SA (K-DUR,KLOR-CON) 20 MEQ tablet Take 20 mEq by mouth daily.      . traMADol (ULTRAM) 50 MG tablet Take 50 mg by mouth every 6 (six) hours as needed.      . triamterene-hydrochlorothiazide (MAXZIDE) 75-50 MG per tablet Take 1 tablet by mouth daily.       No current facility-administered medications for this visit.    Allergies:   No Known Allergies  Social History:  The patient  reports that he has never smoked. He does not have any smokeless tobacco history on file. He reports that he does not drink alcohol.   Family History:  The patient's family history includes Cancer in his brother and brother; Diabetes in his mother; Heart attack in his brother, mother, and son; Hypertension in his mother, sister, and sister.   ROS:  Please see the history of present illness.  No nausea, vomiting.  No fevers, chills.  No focal weakness.  No dysuria. Knee pain bilaterally.  All other systems reviewed and negative.   PHYSICAL EXAM: VS:  BP 130/82  Pulse 63  Ht 5\' 9"  (1.753 m)  Wt 256 lb (116.121 kg)  BMI 37.79 kg/m2 Well nourished, well developed, in no acute distress HEENT: normal Neck: no JVD, no carotid bruits Cardiac:  normal S1, S2; RRR;  Lungs:  clear to auscultation bilaterally, no wheezing, rhonchi or rales Abd: soft, nontender, no hepatomegaly Ext: no edema Skin: warm and dry Neuro:   no focal abnormalities noted  EKG:  Normal     ASSESSMENT AND PLAN:  Pre-operative cardiovascular examination  No ischemia by 06/2010 cardiolite. He walks up stairs without cardiac sx. No further cardiac testing needed before surgery. ECG from May 19, 2012 reviewed. Normal sinus rhythm with no significant ST segment changes.  Normal ECG today.  Okay to hold Plavix and aspirin 5 days prior to surgery.  2. CAD in native artery  Continue Amlodipine-Atorvastatin Tablet, 10-80  MG, 1 tablet, Orally, once a day for blood pressure and cholesterol Continue Atenolol Tablet, 100 MG, 1 tablet, once a day for blood pressure No angina. Had large stent placed in the RCA in 2006. No sx like he had prior to his stent. LDL 36 at last check.  Decrease ASA to 81 mg daily.  3. Obesity  He has lost 10 lbs and kept off the 20 pounds he lost before his last visit. COntinue regular exercise and diet control to continue with weight loss. Hopefully, this will improve after the knee replacement.  4. Hypertension, essential  Continue Lisinopril Tablet, 10 MG, 3 tablet, daily for blood pressure WOuld like to see readings outside of the doctor's office. If elevated at the time of surgery, could increase lisinopril.    Signed, Fredric Mare, MD, [facility] 05/01/2013 2:20 PM

## 2013-05-01 NOTE — Patient Instructions (Signed)
Your physician wants you to follow-up in: 1 year with Dr. Eldridge Dace. You will receive a reminder letter in the mail two months in advance. If you don't receive a letter, please call our office to schedule the follow-up appointment.  Your physician has recommended you make the following change in your medication:   1. Stop Aspirin 325 mg.  2. Start Asprin 81 mg daily.  3. Rx for Nitro sent in for you to use as needed.

## 2013-05-25 ENCOUNTER — Telehealth: Payer: Self-pay | Admitting: Interventional Cardiology

## 2013-05-25 NOTE — Telephone Encounter (Signed)
Received request from Nurse fax box, documents faxed for surgical clearance. To: [facility] Fax number: [phone] Attention: 05/25/13/km

## 2013-05-28 DIAGNOSIS — M5030 Other cervical disc degeneration, unspecified cervical region: Secondary | ICD-10-CM | POA: Diagnosis not present

## 2013-05-28 DIAGNOSIS — S139XXA Sprain of joints and ligaments of unspecified parts of neck, initial encounter: Secondary | ICD-10-CM | POA: Diagnosis not present

## 2013-05-28 DIAGNOSIS — M5430 Sciatica, unspecified side: Secondary | ICD-10-CM | POA: Diagnosis not present

## 2013-05-28 DIAGNOSIS — M533 Sacrococcygeal disorders, not elsewhere classified: Secondary | ICD-10-CM | POA: Diagnosis not present

## 2013-05-28 DIAGNOSIS — M545 Low back pain, unspecified: Secondary | ICD-10-CM | POA: Diagnosis not present

## 2013-05-28 DIAGNOSIS — S335XXA Sprain of ligaments of lumbar spine, initial encounter: Secondary | ICD-10-CM | POA: Diagnosis not present

## 2013-05-28 DIAGNOSIS — M5137 Other intervertebral disc degeneration, lumbosacral region: Secondary | ICD-10-CM | POA: Diagnosis not present

## 2013-06-04 DIAGNOSIS — M533 Sacrococcygeal disorders, not elsewhere classified: Secondary | ICD-10-CM | POA: Diagnosis not present

## 2013-06-04 DIAGNOSIS — M5430 Sciatica, unspecified side: Secondary | ICD-10-CM | POA: Diagnosis not present

## 2013-06-04 DIAGNOSIS — S139XXA Sprain of joints and ligaments of unspecified parts of neck, initial encounter: Secondary | ICD-10-CM | POA: Diagnosis not present

## 2013-06-04 DIAGNOSIS — M5137 Other intervertebral disc degeneration, lumbosacral region: Secondary | ICD-10-CM | POA: Diagnosis not present

## 2013-06-04 DIAGNOSIS — M545 Low back pain, unspecified: Secondary | ICD-10-CM | POA: Diagnosis not present

## 2013-06-04 DIAGNOSIS — S335XXA Sprain of ligaments of lumbar spine, initial encounter: Secondary | ICD-10-CM | POA: Diagnosis not present

## 2013-06-04 DIAGNOSIS — M5030 Other cervical disc degeneration, unspecified cervical region: Secondary | ICD-10-CM | POA: Diagnosis not present

## 2013-06-05 DIAGNOSIS — E1129 Type 2 diabetes mellitus with other diabetic kidney complication: Secondary | ICD-10-CM | POA: Diagnosis not present

## 2013-06-05 DIAGNOSIS — G8929 Other chronic pain: Secondary | ICD-10-CM | POA: Diagnosis not present

## 2013-06-05 DIAGNOSIS — E1165 Type 2 diabetes mellitus with hyperglycemia: Secondary | ICD-10-CM | POA: Diagnosis not present

## 2013-06-05 DIAGNOSIS — E119 Type 2 diabetes mellitus without complications: Secondary | ICD-10-CM | POA: Diagnosis not present

## 2013-06-05 DIAGNOSIS — M545 Low back pain, unspecified: Secondary | ICD-10-CM | POA: Diagnosis not present

## 2013-06-05 DIAGNOSIS — M2550 Pain in unspecified joint: Secondary | ICD-10-CM | POA: Diagnosis not present

## 2013-06-25 DIAGNOSIS — M545 Low back pain, unspecified: Secondary | ICD-10-CM | POA: Diagnosis not present

## 2013-06-25 DIAGNOSIS — M5137 Other intervertebral disc degeneration, lumbosacral region: Secondary | ICD-10-CM | POA: Diagnosis not present

## 2013-06-25 DIAGNOSIS — Z79899 Other long term (current) drug therapy: Secondary | ICD-10-CM | POA: Diagnosis not present

## 2013-06-25 DIAGNOSIS — M533 Sacrococcygeal disorders, not elsewhere classified: Secondary | ICD-10-CM | POA: Diagnosis not present

## 2013-07-07 ENCOUNTER — Other Ambulatory Visit: Payer: Self-pay | Admitting: Orthopedic Surgery

## 2013-07-07 NOTE — H&P (Signed)
James Maxwell  DOB: 09/16/1950 Married / Language: English / Race: Black or African American Male  Date of Admission:  07-20-2013  Chief Complaint:  Left Knee Pain  History of Present Illness The patient is a 62 year old male who comes in for a preoperative History and Physical. The patient is scheduled for a left total knee arthroplasty to be performed by Dr. Frank V. Aluisio, MD at [facility] on 07-20-2013. The patient is a 62 year old male who presents for follow up of their knee. The patient is being followed for their left knee pain and osteoarthritis.  Symptoms reported today include: pain (constant) and giving way. The following medication has been used for pain control: Tramadol (that is not providing much relief). James Maxwell states that his left knee has been bothering him for years now. It is getting progressively worse over time. It is hurting him at all times day and night. It is limiting what he can and cannot do. He was seen by James Maxwell several weeks before and total knee arthroplasty was discussed. He has had his right knee replaced then revised. It was revised at [facility] several years ago but he has chronic nerve pain. He states that despite this he wants to get his left knee fixed. He is at a point now where he would like to proceed with the left knee replacement at this time. They have been treated conservatively in the past for the above stated problem and despite conservative measures, they continue to have progressive pain and severe functional limitations and dysfunction. They have failed non-operative management including home exercise, medications, and injections. It is felt that they would benefit from undergoing total joint replacement. Risks and benefits of the procedure have been discussed with the patient and they elect to proceed with surgery. There are no active contraindications to surgery such as ongoing infection or rapidly progressive neurological  disease.   Allergies No Known Drug Allergies  Problem List/Past Medical History OA (osteoarthritis) of knee (715.96) Painful total knee replacement (996.77). Right Knee Hypercholesterolemia Myocardial infarction. 1997 Rheumatoid Arthritis High blood pressure Gout Diabetes Mellitus, Type II Coronary Artery Disease/Heart Disease   Family History Rheumatoid Arthritis. First Degree Relatives. father and sister Hypertension. mother, father, sister and brother Congestive Heart Failure. mother, sister and brother Diabetes Mellitus. sister Cancer. brother    Social History Living situation. live with spouse Marital status. married Drug/Alcohol Rehab (Currently). no Drug/Alcohol Rehab (Previously). no Current work status. disabled Children. 3 Illicit drug use. no Tobacco use. Former smoker. Post-Surgical Plans. Home   Medication History MetFORMIN HCl (500MG (OSM) Tablet ER 24HR, Oral) Active. Pantoprazole Sodium (40MG Tablet DR, Oral) Active. Plavix (75MG Tablet, Oral) Active. Atenolol (100MG Tablet, Oral) Active. Amlodipine-Atorvastatin (10-80MG Tablet, Oral) Active. Lisinopril (20MG Tablet, Oral) Active. Aspirin EC (325MG Tablet DR, Oral) Active. Invokana (100MG Tablet, Oral) Active. Potassium Chloride (20MEQ Tablet ER, Oral) Active.    Past Surgical History Total Knee Replacement. right Coronary Stenting. Date: 2005. One   Review of Systems General:Not Present- Chills, Fever, Night Sweats, Fatigue, Weight Gain, Weight Loss and Memory Loss. Skin:Not Present- Hives, Itching, Rash, Eczema and Lesions. HEENT:Not Present- Tinnitus, Headache, Double Vision, Visual Loss, Hearing Loss and Dentures. Respiratory:Not Present- Shortness of breath with exertion, Shortness of breath at rest, Allergies, Coughing up blood and Chronic Cough. Cardiovascular:Not Present- Chest Pain, Racing/skipping heartbeats, Difficulty Breathing Lying Down,  Murmur, Swelling and Palpitations. Gastrointestinal:Not Present- Bloody Stool, Heartburn, Abdominal Pain, Vomiting, Nausea, Constipation, Diarrhea, Difficulty Swallowing, Jaundice and   Loss of appetitie. Male Genitourinary:Not Present- Urinary frequency, Blood in Urine, Weak urinary stream, Discharge, Flank Pain, Incontinence, Painful Urination, Urgency, Urinary Retention and Urinating at Night. Musculoskeletal:Present- Joint Pain, Back Pain and Morning Stiffness. Not Present- Muscle Weakness, Muscle Pain, Joint Swelling and Spasms. Neurological:Not Present- Tremor, Dizziness, Blackout spells, Paralysis, Difficulty with balance and Weakness. Psychiatric:Not Present- Insomnia.    Vitals Weight: 262 lb Height: 69 in Weight was reported by patient. Height was reported by patient. Body Surface Area: 2.41 m Body Mass Index: 38.69 kg/m Pulse: 64 (Regular) Resp.: 14 (Unlabored) BP: 132/74 (Sitting, Right Arm, Standard)     Physical Exam The physical exam findings are as follows:   General Mental Status - Alert, cooperative and good historian. General Appearance- pleasant. Not in acute distress. Orientation- Oriented X3. Build & Nutrition- Well nourished and Well developed.   Head and Neck Head- normocephalic, atraumatic . Neck Global Assessment- supple. no bruit auscultated on the right and no bruit auscultated on the left. partial lower denture plate  Eye Pupil- Bilateral- Regular and Round. Motion- Bilateral- EOMI.   Chest and Lung Exam Auscultation: Breath sounds:- clear at anterior chest wall and - clear at posterior chest wall. Adventitious sounds:- No Adventitious sounds.   Cardiovascular Auscultation:Rhythm- Regular rate and rhythm. Heart Sounds- S1 WNL and S2 WNL. Murmurs & Other Heart Sounds:Auscultation of the heart reveals - No Murmurs.   Abdomen Inspection:Contour- Generalized mild  distention. Palpation/Percussion:Tenderness- Abdomen is non-tender to palpation. Rigidity (guarding)- Abdomen is soft. Auscultation:Auscultation of the abdomen reveals - Bowel sounds normal.   Male Genitourinary  Not done, not pertinent to present illness  Musculoskeletal On exam, he is alert and oriented in no apparent distress. His hips show normal motion with no discomfort. his left knee shows no effusion. His range of motion is 5-120 degrees. He has varus deformity. He is tender medial greater than lateral. There is no instability noted.  RADIOGRAPHS: Radiographs reviewed. AP both knees and lateral of the left show bone on bone arthritis in the medial and patellofemoral compartments of the left knee. He has a revision prosthesis on the right in good position.   Assessment & Plan OA (osteoarthritis) of knee (715.96) Impression: Left Knee  Note: Plan is for a Left Total Knee Replacement by Dr. Wynelle Link.  Plan is to go home.  Cardiology - Dr. Irish Lack - Patient has been seen preoperatively and felt to be stable for surgery. The patient is recommended to stay on his beta-blocker. PCP - Dr. Cooper Render  The patient will not receive TXA (tranexamic acid) due to: Coronary Disease, MI  Signed electronically by Joelene Millin, III PA-C

## 2013-07-08 ENCOUNTER — Encounter (HOSPITAL_COMMUNITY): Payer: Self-pay | Admitting: Pharmacy Technician

## 2013-07-08 NOTE — Progress Notes (Signed)
Please put orders in Epic surgery 07-20-13 pre op visit 07-15-13 Thank you!

## 2013-07-09 ENCOUNTER — Other Ambulatory Visit: Payer: Self-pay | Admitting: Orthopedic Surgery

## 2013-07-14 ENCOUNTER — Other Ambulatory Visit (HOSPITAL_COMMUNITY): Payer: Self-pay | Admitting: *Deleted

## 2013-07-15 ENCOUNTER — Encounter (HOSPITAL_COMMUNITY)
Admission: RE | Admit: 2013-07-15 | Discharge: 2013-07-15 | Disposition: A | Discharge status: Home / Self Care | Payer: Managed Care, Other (non HMO) | Source: Ambulatory Visit | Attending: Orthopedic Surgery | Admitting: Orthopedic Surgery

## 2013-07-15 ENCOUNTER — Encounter (INDEPENDENT_AMBULATORY_CARE_PROVIDER_SITE_OTHER): Payer: Self-pay

## 2013-07-15 ENCOUNTER — Ambulatory Visit (HOSPITAL_COMMUNITY)
Admission: RE | Admit: 2013-07-15 | Discharge: 2013-07-15 | Disposition: A | Discharge status: Home / Self Care | Payer: Managed Care, Other (non HMO) | Source: Ambulatory Visit | Attending: Orthopedic Surgery | Admitting: Orthopedic Surgery

## 2013-07-15 ENCOUNTER — Encounter (HOSPITAL_COMMUNITY): Payer: Self-pay

## 2013-07-15 DIAGNOSIS — Z01818 Encounter for other preprocedural examination: Secondary | ICD-10-CM | POA: Diagnosis not present

## 2013-07-15 DIAGNOSIS — Z01812 Encounter for preprocedural laboratory examination: Secondary | ICD-10-CM | POA: Diagnosis not present

## 2013-07-15 HISTORY — DX: Personal history of other diseases of the musculoskeletal system and connective tissue: Z87.39

## 2013-07-15 HISTORY — DX: Gastro-esophageal reflux disease without esophagitis: K21.9

## 2013-07-15 HISTORY — DX: Atherosclerotic heart disease of native coronary artery without angina pectoris: I25.10

## 2013-07-15 LAB — URINALYSIS, ROUTINE W REFLEX MICROSCOPIC
Bilirubin Urine: NEGATIVE
Hgb urine dipstick: NEGATIVE
Ketones, ur: NEGATIVE mg/dL
LEUKOCYTES UA: NEGATIVE
NITRITE: NEGATIVE
PROTEIN: NEGATIVE mg/dL
Specific Gravity, Urine: 1.029 (ref 1.005–1.030)
Urobilinogen, UA: 0.2 mg/dL (ref 0.0–1.0)
pH: 5 (ref 5.0–8.0)

## 2013-07-15 LAB — URINE MICROSCOPIC-ADD ON

## 2013-07-15 LAB — COMPREHENSIVE METABOLIC PANEL
ALT: 14 U/L (ref 0–53)
AST: 22 U/L (ref 0–37)
Albumin: 3.8 g/dL (ref 3.5–5.2)
Alkaline Phosphatase: 86 U/L (ref 39–117)
BUN: 12 mg/dL (ref 6–23)
CO2: 23 mEq/L (ref 19–32)
Calcium: 9.9 mg/dL (ref 8.4–10.5)
Chloride: 101 mEq/L (ref 96–112)
Creatinine, Ser: 1.08 mg/dL (ref 0.50–1.35)
GFR calc non Af Amer: 72 mL/min — ABNORMAL LOW (ref >90)
GFR, EST AFRICAN AMERICAN: 83 mL/min — AB (ref >90)
Glucose, Bld: 114 mg/dL — ABNORMAL HIGH (ref 70–99)
Potassium: 4.8 mEq/L (ref 3.7–5.3)
SODIUM: 139 meq/L (ref 137–147)
TOTAL PROTEIN: 8 g/dL (ref 6.0–8.3)
Total Bilirubin: 0.7 mg/dL (ref 0.3–1.2)

## 2013-07-15 LAB — CBC
HCT: 46.3 % (ref 39.0–52.0)
Hemoglobin: 15.5 g/dL (ref 13.0–17.0)
MCH: 28.2 pg (ref 26.0–34.0)
MCHC: 33.5 g/dL (ref 30.0–36.0)
MCV: 84.3 fL (ref 78.0–100.0)
Platelets: 288 10*3/uL (ref 150–400)
RBC: 5.49 MIL/uL (ref 4.22–5.81)
RDW: 13.4 % (ref 11.5–15.5)
WBC: 8.3 10*3/uL (ref 4.0–10.5)

## 2013-07-15 LAB — ABO/RH: ABO/RH(D): A POS

## 2013-07-15 LAB — PROTIME-INR
INR: 0.99 (ref 0.00–1.49)
Prothrombin Time: 12.9 seconds (ref 11.6–15.2)

## 2013-07-15 LAB — SURGICAL PCR SCREEN
MRSA, PCR: NEGATIVE
Staphylococcus aureus: NEGATIVE

## 2013-07-15 LAB — APTT: aPTT: 27 seconds (ref 24–37)

## 2013-07-15 NOTE — Patient Instructions (Signed)
DAUNTE OESTREICH  07/15/2013                           YOUR PROCEDURE IS SCHEDULED ON:  07/20/13               PLEASE REPORT TO SHORT STAY CENTER AT :  12:00 PM               CALL THIS NUMBER IF ANY PROBLEMS THE DAY OF SURGERY :               [phone]                                REMEMBER:   Do not eat food or drink liquids AFTER MIDNIGHT   May have clear liquids UNTIL 6 HOURS BEFORE SURGERY (9:00 AM)               Take these medicines the morning of surgery with A SIP OF WATER:  CADUET / ATENOLOL / PANTOPRAZOLE   Do not wear jewelry, make-up   Do not wear lotions, powders, or perfumes.   Do not shave legs or underarms 12 hrs. before surgery (men may shave face)  Do not bring valuables to the hospital.  Contacts, dentures or bridgework may not be worn into surgery.  Leave suitcase in the car. After surgery it may be brought to your room.  For patients admitted to the hospital more than one night, checkout time is 11:00 AM                                                       The day of discharge.   Patients discharged the day of surgery will not be allowed to drive home.                If going home same day of surgery, must have someone stay with you            FIRST 24 hrs at home and arrange for some one to drive you home from hospital.    Special Instructions             Please read over the following fact sheets that you were given:               1. [place]               2. INCENTIVE SPIROMETER               3. MRSA INFORMATION SHEET                                                 X_____________________________________________________________________        Failure to follow these instructions may result in cancellation of your surgery

## 2013-07-15 NOTE — Progress Notes (Signed)
07/15/13 1010  OBSTRUCTIVE SLEEP APNEA  Have you ever been diagnosed with sleep apnea through a sleep study? No  Do you snore loudly (loud enough to be heard through closed doors)?  1  Do you often feel tired, fatigued, or sleepy during the daytime? 0  Has anyone observed you stop breathing during your sleep? 0  Do you have, or are you being treated for high blood pressure? 1  BMI more than 35 kg/m2? 1  Age over 63 years old? 1  Neck circumference greater than 40 cm/18 inches? 1  Gender: 1  Obstructive Sleep Apnea Score 6  Score 4 or greater  Results sent to PCP

## 2013-07-20 ENCOUNTER — Inpatient Hospital Stay (HOSPITAL_COMMUNITY): Payer: Managed Care, Other (non HMO) | Admitting: Anesthesiology

## 2013-07-20 ENCOUNTER — Inpatient Hospital Stay (HOSPITAL_COMMUNITY)
Admission: RE | Admit: 2013-07-20 | Discharge: 2013-07-22 | DRG: 470 | Disposition: A | Discharge status: Home Health | Payer: Managed Care, Other (non HMO) | Source: Ambulatory Visit | Attending: Orthopedic Surgery | Admitting: Orthopedic Surgery

## 2013-07-20 ENCOUNTER — Encounter (HOSPITAL_COMMUNITY)
Admission: RE | Disposition: A | Discharge status: Home Health | Payer: Self-pay | Source: Ambulatory Visit | Attending: Orthopedic Surgery

## 2013-07-20 ENCOUNTER — Encounter (HOSPITAL_COMMUNITY): Payer: Self-pay | Admitting: *Deleted

## 2013-07-20 ENCOUNTER — Encounter (HOSPITAL_COMMUNITY): Payer: Managed Care, Other (non HMO) | Admitting: Anesthesiology

## 2013-07-20 DIAGNOSIS — Z8249 Family history of ischemic heart disease and other diseases of the circulatory system: Secondary | ICD-10-CM | POA: Diagnosis not present

## 2013-07-20 DIAGNOSIS — I252 Old myocardial infarction: Secondary | ICD-10-CM | POA: Diagnosis not present

## 2013-07-20 DIAGNOSIS — E7800 Pure hypercholesterolemia, unspecified: Secondary | ICD-10-CM | POA: Diagnosis present

## 2013-07-20 DIAGNOSIS — Z87891 Personal history of nicotine dependence: Secondary | ICD-10-CM

## 2013-07-20 DIAGNOSIS — K219 Gastro-esophageal reflux disease without esophagitis: Secondary | ICD-10-CM

## 2013-07-20 DIAGNOSIS — I10 Essential (primary) hypertension: Secondary | ICD-10-CM | POA: Diagnosis present

## 2013-07-20 DIAGNOSIS — E119 Type 2 diabetes mellitus without complications: Secondary | ICD-10-CM | POA: Diagnosis present

## 2013-07-20 DIAGNOSIS — M1710 Unilateral primary osteoarthritis, unspecified knee: Principal | ICD-10-CM | POA: Diagnosis present

## 2013-07-20 DIAGNOSIS — IMO0002 Reserved for concepts with insufficient information to code with codable children: Secondary | ICD-10-CM | POA: Diagnosis not present

## 2013-07-20 DIAGNOSIS — I509 Heart failure, unspecified: Secondary | ICD-10-CM

## 2013-07-20 DIAGNOSIS — Z833 Family history of diabetes mellitus: Secondary | ICD-10-CM

## 2013-07-20 DIAGNOSIS — M179 Osteoarthritis of knee, unspecified: Secondary | ICD-10-CM

## 2013-07-20 DIAGNOSIS — I2510 Atherosclerotic heart disease of native coronary artery without angina pectoris: Secondary | ICD-10-CM

## 2013-07-20 DIAGNOSIS — Z79899 Other long term (current) drug therapy: Secondary | ICD-10-CM | POA: Diagnosis not present

## 2013-07-20 DIAGNOSIS — M109 Gout, unspecified: Secondary | ICD-10-CM | POA: Diagnosis present

## 2013-07-20 DIAGNOSIS — Z96652 Presence of left artificial knee joint: Secondary | ICD-10-CM

## 2013-07-20 DIAGNOSIS — Z96659 Presence of unspecified artificial knee joint: Secondary | ICD-10-CM | POA: Diagnosis not present

## 2013-07-20 DIAGNOSIS — M1990 Unspecified osteoarthritis, unspecified site: Secondary | ICD-10-CM

## 2013-07-20 DIAGNOSIS — M069 Rheumatoid arthritis, unspecified: Secondary | ICD-10-CM | POA: Diagnosis present

## 2013-07-20 HISTORY — PX: TOTAL KNEE ARTHROPLASTY: SHX125

## 2013-07-20 LAB — GLUCOSE, CAPILLARY
GLUCOSE-CAPILLARY: 119 mg/dL — AB (ref 70–99)
GLUCOSE-CAPILLARY: 147 mg/dL — AB (ref 70–99)
Glucose-Capillary: 107 mg/dL — ABNORMAL HIGH (ref 70–99)

## 2013-07-20 LAB — TYPE AND SCREEN
ABO/RH(D): A POS
Antibody Screen: NEGATIVE

## 2013-07-20 SURGERY — ARTHROPLASTY, KNEE, TOTAL
Anesthesia: Spinal | Site: Knee | Laterality: Left

## 2013-07-20 MED ORDER — DOCUSATE SODIUM 100 MG PO CAPS
100.0000 mg | ORAL_CAPSULE | Freq: Two times a day (BID) | ORAL | Status: DC
  Administered 2013-07-20 – 2013-07-22 (×4): 100 mg via ORAL

## 2013-07-20 MED ORDER — ACETAMINOPHEN 500 MG PO TABS
1000.0000 mg | ORAL_TABLET | Freq: Four times a day (QID) | ORAL | Status: AC
  Administered 2013-07-20 – 2013-07-21 (×4): 1000 mg via ORAL
  Filled 2013-07-20 (×6): qty 2

## 2013-07-20 MED ORDER — SODIUM CHLORIDE 0.9 % IR SOLN
Status: DC | PRN
  Administered 2013-07-20: 1000 mL

## 2013-07-20 MED ORDER — KETOROLAC TROMETHAMINE 15 MG/ML IJ SOLN
7.5000 mg | Freq: Four times a day (QID) | INTRAMUSCULAR | Status: AC | PRN
  Administered 2013-07-20: 7.5 mg via INTRAVENOUS
  Filled 2013-07-20: qty 1

## 2013-07-20 MED ORDER — DIPHENHYDRAMINE HCL 50 MG/ML IJ SOLN
INTRAMUSCULAR | Status: AC
Start: 2013-07-20 — End: 2013-07-21
  Filled 2013-07-20: qty 1

## 2013-07-20 MED ORDER — HYDROMORPHONE HCL PF 2 MG/ML IJ SOLN
INTRAMUSCULAR | Status: AC
  Filled 2013-07-20: qty 1

## 2013-07-20 MED ORDER — BUPIVACAINE LIPOSOME 1.3 % IJ SUSP
INTRAMUSCULAR | Status: DC | PRN
  Administered 2013-07-20: 20 mL

## 2013-07-20 MED ORDER — NITROGLYCERIN 0.4 MG SL SUBL: 0.4000 mg | SUBLINGUAL_TABLET | SUBLINGUAL | Status: DC | PRN

## 2013-07-20 MED ORDER — METHOCARBAMOL 100 MG/ML IJ SOLN
500.0000 mg | Freq: Four times a day (QID) | INTRAVENOUS | Status: DC | PRN
  Administered 2013-07-20: 500 mg via INTRAVENOUS
  Filled 2013-07-20: qty 5

## 2013-07-20 MED ORDER — ACETAMINOPHEN 10 MG/ML IV SOLN
1000.0000 mg | Freq: Once | INTRAVENOUS | Status: AC
  Administered 2013-07-20: 1000 mg via INTRAVENOUS
  Filled 2013-07-20: qty 100

## 2013-07-20 MED ORDER — RIVAROXABAN 10 MG PO TABS
10.0000 mg | ORAL_TABLET | Freq: Every day | ORAL | Status: DC
  Administered 2013-07-21 – 2013-07-22 (×2): 10 mg via ORAL
  Filled 2013-07-20 (×3): qty 1

## 2013-07-20 MED ORDER — METHOCARBAMOL 500 MG PO TABS
500.0000 mg | ORAL_TABLET | Freq: Four times a day (QID) | ORAL | Status: DC | PRN
  Administered 2013-07-21 – 2013-07-22 (×5): 500 mg via ORAL
  Filled 2013-07-20 (×5): qty 1

## 2013-07-20 MED ORDER — MORPHINE SULFATE 2 MG/ML IJ SOLN
1.0000 mg | INTRAMUSCULAR | Status: DC | PRN
  Administered 2013-07-21: 1 mg via INTRAVENOUS
  Administered 2013-07-21: 2 mg via INTRAVENOUS
  Filled 2013-07-20 (×2): qty 1

## 2013-07-20 MED ORDER — POTASSIUM CHLORIDE CRYS ER 20 MEQ PO TBCR
20.0000 meq | EXTENDED_RELEASE_TABLET | Freq: Every day | ORAL | Status: DC
  Administered 2013-07-20 – 2013-07-22 (×3): 20 meq via ORAL
  Filled 2013-07-20 (×3): qty 1

## 2013-07-20 MED ORDER — FLEET ENEMA 7-19 GM/118ML RE ENEM: 1.0000 | ENEMA | Freq: Once | RECTAL | Status: AC | PRN

## 2013-07-20 MED ORDER — OXYCODONE HCL 5 MG PO TABS
5.0000 mg | ORAL_TABLET | ORAL | Status: DC | PRN
  Administered 2013-07-20 – 2013-07-21 (×5): 10 mg via ORAL
  Filled 2013-07-20 (×5): qty 2

## 2013-07-20 MED ORDER — FENTANYL CITRATE 0.05 MG/ML IJ SOLN
INTRAMUSCULAR | Status: AC
  Filled 2013-07-20: qty 2

## 2013-07-20 MED ORDER — SODIUM CHLORIDE 0.9 % IJ SOLN
INTRAMUSCULAR | Status: DC | PRN
  Administered 2013-07-20: 30 mL

## 2013-07-20 MED ORDER — LACTATED RINGERS IV SOLN
INTRAVENOUS | Status: DC
  Administered 2013-07-20: 16:00:00 via INTRAVENOUS
  Administered 2013-07-20: 1000 mL via INTRAVENOUS

## 2013-07-20 MED ORDER — PROPOFOL INFUSION 10 MG/ML OPTIME
INTRAVENOUS | Status: DC | PRN
  Administered 2013-07-20: 140 ug/kg/min via INTRAVENOUS

## 2013-07-20 MED ORDER — ONDANSETRON HCL 4 MG PO TABS: 4.0000 mg | ORAL_TABLET | Freq: Four times a day (QID) | ORAL | Status: DC | PRN

## 2013-07-20 MED ORDER — PROPOFOL 10 MG/ML IV BOLUS
INTRAVENOUS | Status: AC
  Filled 2013-07-20: qty 20

## 2013-07-20 MED ORDER — CEFAZOLIN SODIUM-DEXTROSE 2-3 GM-% IV SOLR
INTRAVENOUS | Status: AC
  Filled 2013-07-20: qty 50

## 2013-07-20 MED ORDER — MIDAZOLAM HCL 2 MG/2ML IJ SOLN
INTRAMUSCULAR | Status: AC
  Filled 2013-07-20: qty 2

## 2013-07-20 MED ORDER — BUPIVACAINE LIPOSOME 1.3 % IJ SUSP
20.0000 mL | Freq: Once | INTRAMUSCULAR | Status: DC
  Filled 2013-07-20: qty 20

## 2013-07-20 MED ORDER — PHENOL 1.4 % MT LIQD
1.0000 | OROMUCOSAL | Status: DC | PRN
  Filled 2013-07-20: qty 177

## 2013-07-20 MED ORDER — SODIUM CHLORIDE 0.9 % IJ SOLN
INTRAMUSCULAR | Status: AC
Start: 2013-07-20 — End: 2013-07-20
  Filled 2013-07-20: qty 50

## 2013-07-20 MED ORDER — POLYETHYLENE GLYCOL 3350 17 G PO PACK: 17.0000 g | PACK | Freq: Every day | ORAL | Status: DC | PRN

## 2013-07-20 MED ORDER — MIDAZOLAM HCL 5 MG/5ML IJ SOLN
INTRAMUSCULAR | Status: DC | PRN
  Administered 2013-07-20: 2 mg via INTRAVENOUS

## 2013-07-20 MED ORDER — PROMETHAZINE HCL 25 MG/ML IJ SOLN: 6.2500 mg | INTRAMUSCULAR | Status: DC | PRN

## 2013-07-20 MED ORDER — SODIUM CHLORIDE 0.9 % IV SOLN: INTRAVENOUS | Status: DC

## 2013-07-20 MED ORDER — ONDANSETRON HCL 4 MG/2ML IJ SOLN
INTRAMUSCULAR | Status: DC | PRN
  Administered 2013-07-20: 4 mg via INTRAVENOUS

## 2013-07-20 MED ORDER — ACETAMINOPHEN 325 MG PO TABS: 650.0000 mg | ORAL_TABLET | Freq: Four times a day (QID) | ORAL | Status: DC | PRN

## 2013-07-20 MED ORDER — HYDROMORPHONE HCL PF 1 MG/ML IJ SOLN
INTRAMUSCULAR | Status: DC | PRN
  Administered 2013-07-20 (×4): 0.5 mg via INTRAVENOUS

## 2013-07-20 MED ORDER — CEFAZOLIN SODIUM-DEXTROSE 2-3 GM-% IV SOLR
2.0000 g | Freq: Four times a day (QID) | INTRAVENOUS | Status: AC
  Administered 2013-07-20 – 2013-07-21 (×2): 2 g via INTRAVENOUS
  Filled 2013-07-20 (×2): qty 50

## 2013-07-20 MED ORDER — CEFAZOLIN SODIUM-DEXTROSE 2-3 GM-% IV SOLR
2.0000 g | INTRAVENOUS | Status: AC
  Administered 2013-07-20: 2 g via INTRAVENOUS

## 2013-07-20 MED ORDER — METOCLOPRAMIDE HCL 5 MG/ML IJ SOLN: 5.0000 mg | Freq: Three times a day (TID) | INTRAMUSCULAR | Status: DC | PRN

## 2013-07-20 MED ORDER — AMLODIPINE-ATORVASTATIN 10-80 MG PO TABS: 1.0000 | ORAL_TABLET | Freq: Every morning | ORAL | Status: DC

## 2013-07-20 MED ORDER — CANAGLIFLOZIN 100 MG PO TABS
100.0000 mg | ORAL_TABLET | Freq: Every morning | ORAL | Status: DC
  Administered 2013-07-21 – 2013-07-22 (×2): 100 mg via ORAL
  Filled 2013-07-20 (×2): qty 1

## 2013-07-20 MED ORDER — PHENYLEPHRINE 40 MCG/ML (10ML) SYRINGE FOR IV PUSH (FOR BLOOD PRESSURE SUPPORT)
PREFILLED_SYRINGE | INTRAVENOUS | Status: AC
  Filled 2013-07-20: qty 10

## 2013-07-20 MED ORDER — BUPIVACAINE HCL 0.25 % IJ SOLN
INTRAMUSCULAR | Status: DC | PRN
  Administered 2013-07-20: 20 mL

## 2013-07-20 MED ORDER — ATORVASTATIN CALCIUM 80 MG PO TABS
80.0000 mg | ORAL_TABLET | Freq: Every day | ORAL | Status: DC
  Administered 2013-07-21: 80 mg via ORAL
  Filled 2013-07-20 (×3): qty 1

## 2013-07-20 MED ORDER — DIPHENHYDRAMINE HCL 12.5 MG/5ML PO ELIX
12.5000 mg | ORAL_SOLUTION | ORAL | Status: DC | PRN
  Administered 2013-07-21: 25 mg via ORAL
  Filled 2013-07-20: qty 10

## 2013-07-20 MED ORDER — CHLORHEXIDINE GLUCONATE 4 % EX LIQD: 60.0000 mL | Freq: Once | CUTANEOUS | Status: DC

## 2013-07-20 MED ORDER — BISACODYL 10 MG RE SUPP: 10.0000 mg | Freq: Every day | RECTAL | Status: DC | PRN

## 2013-07-20 MED ORDER — HYDROMORPHONE HCL PF 1 MG/ML IJ SOLN
0.2500 mg | INTRAMUSCULAR | Status: DC | PRN
  Administered 2013-07-20 (×2): 0.5 mg via INTRAVENOUS

## 2013-07-20 MED ORDER — SODIUM CHLORIDE 0.9 % IV SOLN
INTRAVENOUS | Status: DC
Start: 2013-07-20 — End: 2013-07-22
  Administered 2013-07-20 – 2013-07-21 (×2): via INTRAVENOUS

## 2013-07-20 MED ORDER — HYDROMORPHONE HCL PF 1 MG/ML IJ SOLN
INTRAMUSCULAR | Status: AC
  Filled 2013-07-20: qty 1

## 2013-07-20 MED ORDER — ONDANSETRON HCL 4 MG/2ML IJ SOLN: 4.0000 mg | Freq: Four times a day (QID) | INTRAMUSCULAR | Status: DC | PRN

## 2013-07-20 MED ORDER — PANTOPRAZOLE SODIUM 40 MG PO TBEC
40.0000 mg | DELAYED_RELEASE_TABLET | Freq: Every day | ORAL | Status: DC
  Administered 2013-07-21 – 2013-07-22 (×2): 40 mg via ORAL
  Filled 2013-07-20 (×2): qty 1

## 2013-07-20 MED ORDER — MENTHOL 3 MG MT LOZG
1.0000 | LOZENGE | OROMUCOSAL | Status: DC | PRN
  Filled 2013-07-20: qty 9

## 2013-07-20 MED ORDER — ACETAMINOPHEN 650 MG RE SUPP: 650.0000 mg | Freq: Four times a day (QID) | RECTAL | Status: DC | PRN

## 2013-07-20 MED ORDER — AMLODIPINE BESYLATE 10 MG PO TABS
10.0000 mg | ORAL_TABLET | Freq: Every day | ORAL | Status: DC
  Administered 2013-07-21 – 2013-07-22 (×2): 10 mg via ORAL
  Filled 2013-07-20 (×2): qty 1

## 2013-07-20 MED ORDER — STERILE WATER FOR IRRIGATION IR SOLN
Status: DC | PRN
  Administered 2013-07-20: 3000 mL

## 2013-07-20 MED ORDER — SODIUM CHLORIDE 0.9 % IJ SOLN
INTRAMUSCULAR | Status: AC
  Filled 2013-07-20: qty 10

## 2013-07-20 MED ORDER — ATENOLOL 100 MG PO TABS
100.0000 mg | ORAL_TABLET | Freq: Every morning | ORAL | Status: DC
  Administered 2013-07-21 – 2013-07-22 (×2): 100 mg via ORAL
  Filled 2013-07-20 (×2): qty 1

## 2013-07-20 MED ORDER — METOCLOPRAMIDE HCL 10 MG PO TABS: 5.0000 mg | ORAL_TABLET | Freq: Three times a day (TID) | ORAL | Status: DC | PRN

## 2013-07-20 MED ORDER — DIPHENHYDRAMINE HCL 50 MG/ML IJ SOLN
25.0000 mg | Freq: Four times a day (QID) | INTRAMUSCULAR | Status: DC | PRN
  Administered 2013-07-20: 25 mg via INTRAVENOUS

## 2013-07-20 MED ORDER — FENTANYL CITRATE 0.05 MG/ML IJ SOLN
INTRAMUSCULAR | Status: DC | PRN
  Administered 2013-07-20 (×2): 50 ug via INTRAVENOUS

## 2013-07-20 MED ORDER — DEXAMETHASONE SODIUM PHOSPHATE 10 MG/ML IJ SOLN: 10.0000 mg | Freq: Once | INTRAMUSCULAR | Status: DC

## 2013-07-20 MED ORDER — INSULIN ASPART 100 UNIT/ML ~~LOC~~ SOLN
0.0000 [IU] | Freq: Three times a day (TID) | SUBCUTANEOUS | Status: DC
  Administered 2013-07-21: 3 [IU] via SUBCUTANEOUS
  Administered 2013-07-21 (×2): 2 [IU] via SUBCUTANEOUS
  Administered 2013-07-22 (×2): 3 [IU] via SUBCUTANEOUS

## 2013-07-20 MED ORDER — TRAMADOL HCL 50 MG PO TABS
50.0000 mg | ORAL_TABLET | Freq: Four times a day (QID) | ORAL | Status: DC | PRN
  Administered 2013-07-22: 100 mg via ORAL
  Filled 2013-07-20: qty 2

## 2013-07-20 MED ORDER — BUPIVACAINE HCL (PF) 0.25 % IJ SOLN
INTRAMUSCULAR | Status: AC
  Filled 2013-07-20: qty 30

## 2013-07-20 MED ORDER — BUPIVACAINE IN DEXTROSE 0.75-8.25 % IT SOLN
INTRATHECAL | Status: DC | PRN
  Administered 2013-07-20: 2 mL via INTRATHECAL

## 2013-07-20 MED ORDER — 0.9 % SODIUM CHLORIDE (POUR BTL) OPTIME
TOPICAL | Status: DC | PRN
  Administered 2013-07-20: 1000 mL

## 2013-07-20 SURGICAL SUPPLY — 55 items
BAG ZIPLOCK 12X15 (MISCELLANEOUS) ×3 IMPLANT
BANDAGE ELASTIC 6 VELCRO ST LF (GAUZE/BANDAGES/DRESSINGS) ×3 IMPLANT
BANDAGE ESMARK 6X9 LF (GAUZE/BANDAGES/DRESSINGS) ×1 IMPLANT
BLADE SAG 18X100X1.27 (BLADE) ×3 IMPLANT
BLADE SAW SGTL 11.0X1.19X90.0M (BLADE) ×3 IMPLANT
BNDG ESMARK 6X9 LF (GAUZE/BANDAGES/DRESSINGS) ×3
BOWL SMART MIX CTS (DISPOSABLE) ×3 IMPLANT
CAP KNEE ATTUNE RP ×3 IMPLANT
CEMENT HV SMART SET (Cement) ×6 IMPLANT
CLOSURE WOUND 1/2 X4 (GAUZE/BANDAGES/DRESSINGS) ×2
CUFF TOURN SGL QUICK 34 (TOURNIQUET CUFF) ×2
CUFF TRNQT CYL 34X4X40X1 (TOURNIQUET CUFF) ×1 IMPLANT
DECANTER SPIKE VIAL GLASS SM (MISCELLANEOUS) ×3 IMPLANT
DRAPE EXTREMITY T 121X128X90 (DRAPE) ×3 IMPLANT
DRAPE POUCH INSTRU U-SHP 10X18 (DRAPES) ×3 IMPLANT
DRAPE U-SHAPE 47X51 STRL (DRAPES) ×3 IMPLANT
DRSG ADAPTIC 3X8 NADH LF (GAUZE/BANDAGES/DRESSINGS) ×3 IMPLANT
DURAPREP 26ML APPLICATOR (WOUND CARE) ×3 IMPLANT
ELECT REM PT RETURN 9FT ADLT (ELECTROSURGICAL) ×3
ELECTRODE REM PT RTRN 9FT ADLT (ELECTROSURGICAL) ×1 IMPLANT
EVACUATOR 1/8 PVC DRAIN (DRAIN) ×3 IMPLANT
FACESHIELD LNG OPTICON STERILE (SAFETY) ×15 IMPLANT
GLOVE BIO SURGEON STRL SZ7.5 (GLOVE) IMPLANT
GLOVE BIO SURGEON STRL SZ8 (GLOVE) ×3 IMPLANT
GLOVE BIOGEL PI IND STRL 8 (GLOVE) ×2 IMPLANT
GLOVE BIOGEL PI INDICATOR 8 (GLOVE) ×4
GLOVE SURG SS PI 6.5 STRL IVOR (GLOVE) IMPLANT
GOWN STRL REUS W/TWL LRG LVL3 (GOWN DISPOSABLE) ×3 IMPLANT
GOWN STRL REUS W/TWL XL LVL3 (GOWN DISPOSABLE) IMPLANT
HANDPIECE INTERPULSE COAX TIP (DISPOSABLE) ×2
IMMOBILIZER KNEE 20 (SOFTGOODS) ×3 IMPLANT
KIT BASIN OR (CUSTOM PROCEDURE TRAY) ×3 IMPLANT
MANIFOLD NEPTUNE II (INSTRUMENTS) ×3 IMPLANT
NDL SAFETY ECLIPSE 18X1.5 (NEEDLE) ×2 IMPLANT
NEEDLE HYPO 18GX1.5 SHARP (NEEDLE) ×4
NS IRRIG 1000ML POUR BTL (IV SOLUTION) ×3 IMPLANT
PACK TOTAL JOINT (CUSTOM PROCEDURE TRAY) ×3 IMPLANT
PAD ABD 8X10 STRL (GAUZE/BANDAGES/DRESSINGS) ×3 IMPLANT
PADDING CAST COTTON 6X4 STRL (CAST SUPPLIES) ×6 IMPLANT
POSITIONER SURGICAL ARM (MISCELLANEOUS) ×3 IMPLANT
SET HNDPC FAN SPRY TIP SCT (DISPOSABLE) ×1 IMPLANT
SPONGE GAUZE 4X4 12PLY (GAUZE/BANDAGES/DRESSINGS) ×3 IMPLANT
STRIP CLOSURE SKIN 1/2X4 (GAUZE/BANDAGES/DRESSINGS) ×4 IMPLANT
SUCTION FRAZIER 12FR DISP (SUCTIONS) ×3 IMPLANT
SUT MNCRL AB 4-0 PS2 18 (SUTURE) ×3 IMPLANT
SUT VIC AB 2-0 CT1 27 (SUTURE) ×6
SUT VIC AB 2-0 CT1 TAPERPNT 27 (SUTURE) ×3 IMPLANT
SUT VLOC 180 0 24IN GS25 (SUTURE) ×3 IMPLANT
SYR 20CC LL (SYRINGE) ×3 IMPLANT
SYR 50ML LL SCALE MARK (SYRINGE) ×3 IMPLANT
TOWEL OR 17X26 10 PK STRL BLUE (TOWEL DISPOSABLE) ×6 IMPLANT
TRAY FOLEY CATH 14FRSI W/METER (CATHETERS) IMPLANT
TRAY FOLEY CATH 16FR SILVER (SET/KITS/TRAYS/PACK) ×3 IMPLANT
WATER STERILE IRR 1500ML POUR (IV SOLUTION) ×3 IMPLANT
WRAP KNEE MAXI GEL POST OP (GAUZE/BANDAGES/DRESSINGS) ×3 IMPLANT

## 2013-07-20 NOTE — Plan of Care (Signed)
Problem: Consults Goal: Diagnosis- Total Joint Replacement Primary Total Knee     

## 2013-07-20 NOTE — Anesthesia Procedure Notes (Signed)
Spinal  Patient location during procedure: OR Staffing Performed by: anesthesiologist  Preanesthetic Checklist Completed: patient identified, site marked, surgical consent, pre-op evaluation, timeout performed, IV checked, risks and benefits discussed and monitors and equipment checked Spinal Block Patient position: sitting Prep: Betadine Patient monitoring: heart rate, continuous pulse ox and blood pressure Injection technique: single-shot Needle Needle type: Sprotte  Needle gauge: 24 G Needle length: 9 cm Additional Notes Expiration date of kit checked and confirmed. Patient tolerated procedure well, without complications.

## 2013-07-20 NOTE — Interval H&P Note (Signed)
History and Physical Interval Note:  07/20/2013 12:58 PM  James Maxwell  has presented today for surgery, with the diagnosis of Osteoarthritis of the left knee  The various methods of treatment have been discussed with the patient and family. After consideration of risks, benefits and other options for treatment, the patient has consented to  Procedure(s): LEFT TOTAL KNEE ARTHROPLASTY (Left) as a surgical intervention .  The patient's history has been reviewed, patient examined, no change in status, stable for surgery.  I have reviewed the patient's chart and labs.  Questions were answered to the patient's satisfaction.     Gearlean Alf

## 2013-07-20 NOTE — Transfer of Care (Signed)
Immediate Anesthesia Transfer of Care Note  Patient: James Maxwell  Procedure(s) Performed: Procedure(s): LEFT TOTAL KNEE ARTHROPLASTY (Left)  Patient Location: PACU  Anesthesia Type:Spinal  Level of Consciousness: awake, alert  and oriented  Airway & Oxygen Therapy: Patient Spontanous Breathing  Post-op Assessment: Report given to PACU RN and Post -op Vital signs reviewed and stable  Post vital signs: Reviewed and stable  Complications: No apparent anesthesia complications

## 2013-07-20 NOTE — Op Note (Signed)
Pre-operative diagnosis- Osteoarthritis  Left knee(s)  Post-operative diagnosis- Osteoarthritis Left knee(s)  Procedure-  Left  Total Knee Arthroplasty (Attune system)  Surgeon- James Plover. James Barreras, MD  Assistant- Arlee Muslim, PA-C   Anesthesia-  Spinal  EBL-* No blood loss amount entered *   Drains Hemovac  Tourniquet time-  Total Tourniquet Time Documented: Thigh (Left) - 44 minutes Total: Thigh (Left) - 44 minutes    Complications- None  Condition-PACU - hemodynamically stable.   Brief Clinical Note   James Maxwell is a 63 y.o. year old male with end stage OA of his left knee with progressively worsening pain and dysfunction. He has constant pain, with activity and at rest and significant functional deficits with difficulties even with ADLs. He has had extensive non-op management including analgesics, injections of cortisone and viscosupplements, and home exercise program, but remains in significant pain with significant dysfunction. Radiographs show bone on bone arthritis medial and patellofemoral with varus deformity. He presents now for left Total Knee Arthroplasty.     Procedure in detail---   The patient is brought into the operating room and positioned supine on the operating table. After successful administration of  Spinal,   a tourniquet is placed high on the  Left thigh(s) and the lower extremity is prepped and draped in the usual sterile fashion. Time out is performed by the operating team and then the  Left lower extremity is wrapped in Esmarch, knee flexed and the tourniquet inflated to 300 mmHg.       A midline incision is made with a ten blade through the subcutaneous tissue to the level of the extensor mechanism. A fresh blade is used to make a medial parapatellar arthrotomy. Soft tissue over the proximal medial tibia is subperiosteally elevated to the joint line with a knife and into the semimembranosus bursa with a Cobb elevator. Soft tissue over the proximal  lateral tibia is elevated with attention being paid to avoiding the patellar tendon on the tibial tubercle. The patella is everted, knee flexed 90 degrees and the ACL and PCL are removed. Findings are bone on bone medial and patellofemoral with large medial and patellar osteophytes.        The drill is used to create a starting hole in the distal femur and the canal is thoroughly irrigated with sterile saline to remove the fatty contents. The 5 degree Left  valgus alignment guide is placed into the femoral canal and the distal femoral cutting block is pinned to remove 10 mm off the distal femur. Resection is made with an oscillating saw.      The tibia is subluxed forward and the menisci are removed. The extramedullary alignment guide is placed referencing proximally at the medial aspect of the tibial tubercle and distally along the second metatarsal axis and tibial crest. The block is pinned to remove 32mm off the more deficient medial  side. Resection is made with an oscillating saw. Size 6is the most appropriate size for the tibia and the proximal tibia is prepared with the modular drill and keel punch for that size.      The femoral sizing guide is placed and size 6 is most appropriate. Rotation is marked off the epicondylar axis and confirmed by creating a rectangular flexion gap at 90 degrees. The size 6 cutting block is pinned in this rotation and the anterior, posterior and chamfer cuts are made with the oscillating saw. The intercondylar block is then placed and that cut is made.  Trial size 6 tibial component, trial size 6 posterior stabilized femur and a 7  mm posterior stabilized rotating platform insert trial is placed. Full extension is achieved with excellent varus/valgus and anterior/posterior balance throughout full range of motion. The patella is everted and thickness measured to be 22  mm. Free hand resection is taken to 12 mm, a 35 template is placed, lug holes are drilled, trial patella  is placed, and it tracks normally. Osteophytes are removed off the posterior femur with the trial in place. All trials are removed and the cut bone surfaces prepared with pulsatile lavage. Cement is mixed and once ready for implantation, the size 6 tibial implant, size  6 posterior stabilized femoral component, and the size 35 patella are cemented in place and the patella is held with the clamp. The trial insert is placed and the knee held in full extension. The Exparel (20 ml mixed with 30 ml saline) and .25% Bupivicaine, are injected into the extensor mechanism, posterior capsule, medial and lateral gutters and subcutaneous tissues.  All extruded cement is removed and once the cement is hard the permanent 7 mm posterior stabilized rotating platform insert is placed into the tibial tray.      The wound is copiously irrigated with saline solution and the extensor mechanism closed over a hemovac drain with #1 PDS suture. The tourniquet is released for a total tourniquet time of 44  minutes. Flexion against gravity is 140 degrees and the patella tracks normally. Subcutaneous tissue is closed with 2.0 vicryl and subcuticular with running 4.0 Monocryl. The incision is cleaned and dried and steri-strips and a bulky sterile dressing are applied. The limb is placed into a knee immobilizer and the patient is awakened and transported to recovery in stable condition.      Please note that a surgical assistant was a medical necessity for this procedure in order to perform it in a safe and expeditious manner. Surgical assistant was necessary to retract the ligaments and vital neurovascular structures to prevent injury to them and also necessary for proper positioning of the limb to allow for anatomic placement of the prosthesis.   James Plover [name], MD    07/20/2013, 5:11 PM

## 2013-07-20 NOTE — Anesthesia Preprocedure Evaluation (Signed)
Anesthesia Evaluation  Patient identified by MRN, date of birth, ID band Patient awake    Reviewed: Allergy & Precautions, H&P , NPO status , Patient's Chart, lab work & pertinent test results  Airway Mallampati: II TM Distance: >3 FB Neck ROM: Full    Dental no notable dental hx.    Pulmonary neg pulmonary ROS, former smoker,  breath sounds clear to auscultation  Pulmonary exam normal       Cardiovascular hypertension, Pt. on medications and Pt. on home beta blockers + CAD, + Past MI and + Cardiac Stents Rhythm:Regular Rate:Normal     Neuro/Psych negative neurological ROS  negative psych ROS   GI/Hepatic negative GI ROS, Neg liver ROS,   Endo/Other  diabetes, Type 2  Renal/GU negative Renal ROS  negative genitourinary   Musculoskeletal negative musculoskeletal ROS (+)   Abdominal   Peds negative pediatric ROS (+)  Hematology negative hematology ROS (+)   Anesthesia Other Findings   Reproductive/Obstetrics negative OB ROS                           Anesthesia Physical Anesthesia Plan  ASA: III  Anesthesia Plan: Spinal   Post-op Pain Management:    Induction: Intravenous  Airway Management Planned: Simple Face Mask  Additional Equipment:   Intra-op Plan:   Post-operative Plan:   Informed Consent: I have reviewed the patients History and Physical, chart, labs and discussed the procedure including the risks, benefits and alternatives for the proposed anesthesia with the patient or authorized representative who has indicated his/her understanding and acceptance.   Dental advisory given  Plan Discussed with: CRNA and Surgeon  Anesthesia Plan Comments: (Check plavix dosing)        Anesthesia Quick Evaluation

## 2013-07-20 NOTE — Preoperative (Signed)
Beta Blockers   Reason not to administer Beta Blockers:Not Applicable 

## 2013-07-20 NOTE — H&P (View-Only) (Signed)
[name]  DOB: 1951/04/17 Married / Language: English / Race: Black or African American Male  Date of Admission:  07-20-2013  Chief Complaint:  Left Knee Pain  History of Present Illness The patient is a 63 year old male who comes in for a preoperative History and Physical. The patient is scheduled for a left total knee arthroplasty to be performed by Dr. Dione Plover. Aluisio, MD at [facility] on 07-20-2013. The patient is a 63 year old male who presents for follow up of their knee. The patient is being followed for their left knee pain and osteoarthritis.  Symptoms reported today include: pain (constant) and giving way. The following medication has been used for pain control: Tramadol (that is not providing much relief). James Maxwell states that his left knee has been bothering him for years now. It is getting progressively worse over time. It is hurting him at all times day and night. It is limiting what he can and cannot do. He was seen by [employer] several weeks before and total knee arthroplasty was discussed. He has had his right knee replaced then revised. It was revised at [facility] several years ago but he has chronic nerve pain. He states that despite this he wants to get his left knee fixed. He is at a point now where he would like to proceed with the left knee replacement at this time. They have been treated conservatively in the past for the above stated problem and despite conservative measures, they continue to have progressive pain and severe functional limitations and dysfunction. They have failed non-operative management including home exercise, medications, and injections. It is felt that they would benefit from undergoing total joint replacement. Risks and benefits of the procedure have been discussed with the patient and they elect to proceed with surgery. There are no active contraindications to surgery such as ongoing infection or rapidly progressive neurological  disease.   Allergies No Known Drug Allergies  Problem List/Past Medical History OA (osteoarthritis) of knee (715.96) Painful total knee replacement (996.77). Right Knee Hypercholesterolemia Myocardial infarction. 1997 Rheumatoid Arthritis High blood pressure Gout Diabetes Mellitus, Type II Coronary Artery Disease/Heart Disease   Family History Rheumatoid Arthritis. First Degree Relatives. father and sister Hypertension. mother, father, sister and brother Congestive Heart Failure. mother, sister and brother Diabetes Mellitus. sister Cancer. brother    Social History Living situation. live with spouse Marital status. married Drug/Alcohol Rehab (Currently). no Drug/Alcohol Rehab (Previously). no Current work status. disabled Children. 3 Illicit drug use. no Tobacco use. Former smoker. Post-Surgical Plans. Home   Medication History MetFORMIN HCl (500MG  (OSM) Tablet ER 24HR, Oral) Active. Pantoprazole Sodium (40MG  Tablet DR, Oral) Active. Plavix (75MG  Tablet, Oral) Active. Atenolol (100MG  Tablet, Oral) Active. Amlodipine-Atorvastatin (10-80MG  Tablet, Oral) Active. Lisinopril (20MG  Tablet, Oral) Active. Aspirin EC (325MG  Tablet DR, Oral) Active. Invokana (100MG  Tablet, Oral) Active. Potassium Chloride (20MEQ Tablet ER, Oral) Active.    Past Surgical History Total Knee Replacement. right Coronary Stenting. Date: 2005. One   Review of Systems General:Not Present- Chills, Fever, Night Sweats, Fatigue, Weight Gain, Weight Loss and Memory Loss. Skin:Not Present- Hives, Itching, Rash, Eczema and Lesions. HEENT:Not Present- Tinnitus, Headache, Double Vision, Visual Loss, Hearing Loss and Dentures. Respiratory:Not Present- Shortness of breath with exertion, Shortness of breath at rest, Allergies, Coughing up blood and Chronic Cough. Cardiovascular:Not Present- Chest Pain, Racing/skipping heartbeats, Difficulty Breathing Lying Down,  Murmur, Swelling and Palpitations. Gastrointestinal:Not Present- Bloody Stool, Heartburn, Abdominal Pain, Vomiting, Nausea, Constipation, Diarrhea, Difficulty Swallowing, Jaundice and  Loss of appetitie. Male Genitourinary:Not Present- Urinary frequency, Blood in Urine, Weak urinary stream, Discharge, Flank Pain, Incontinence, Painful Urination, Urgency, Urinary Retention and Urinating at Night. Musculoskeletal:Present- Joint Pain, Back Pain and Morning Stiffness. Not Present- Muscle Weakness, Muscle Pain, Joint Swelling and Spasms. Neurological:Not Present- Tremor, Dizziness, Blackout spells, Paralysis, Difficulty with balance and Weakness. Psychiatric:Not Present- Insomnia.    Vitals Weight: 262 lb Height: 69 in Weight was reported by patient. Height was reported by patient. Body Surface Area: 2.41 m Body Mass Index: 38.69 kg/m Pulse: 64 (Regular) Resp.: 14 (Unlabored) BP: 132/74 (Sitting, Right Arm, Standard)     Physical Exam The physical exam findings are as follows:   General Mental Status - Alert, cooperative and good historian. General Appearance- pleasant. Not in acute distress. Orientation- Oriented X3. Build & Nutrition- Well nourished and Well developed.   Head and Neck Head- normocephalic, atraumatic . Neck Global Assessment- supple. no bruit auscultated on the right and no bruit auscultated on the left. partial lower denture plate  Eye Pupil- Bilateral- Regular and Round. Motion- Bilateral- EOMI.   Chest and Lung Exam Auscultation: Breath sounds:- clear at anterior chest wall and - clear at posterior chest wall. Adventitious sounds:- No Adventitious sounds.   Cardiovascular Auscultation:Rhythm- Regular rate and rhythm. Heart Sounds- S1 WNL and S2 WNL. Murmurs & Other Heart Sounds:Auscultation of the heart reveals - No Murmurs.   Abdomen Inspection:Contour- Generalized mild  distention. Palpation/Percussion:Tenderness- Abdomen is non-tender to palpation. Rigidity (guarding)- Abdomen is soft. Auscultation:Auscultation of the abdomen reveals - Bowel sounds normal.   Male Genitourinary  Not done, not pertinent to present illness  Musculoskeletal On exam, he is alert and oriented in no apparent distress. His hips show normal motion with no discomfort. his left knee shows no effusion. His range of motion is 5-120 degrees. He has varus deformity. He is tender medial greater than lateral. There is no instability noted.  RADIOGRAPHS: Radiographs reviewed. AP both knees and lateral of the left show bone on bone arthritis in the medial and patellofemoral compartments of the left knee. He has a revision prosthesis on the right in good position.   Assessment & Plan OA (osteoarthritis) of knee (715.96) Impression: Left Knee  Note: Plan is for a Left Total Knee Replacement by Dr. Wynelle Link.  Plan is to go home.  Cardiology - Dr. Irish Lack - Patient has been seen preoperatively and felt to be stable for surgery. The patient is recommended to stay on his beta-blocker. PCP - Dr. Cooper Render  The patient will not receive TXA (tranexamic acid) due to: Coronary Disease, MI  Signed electronically by Joelene Millin, III PA-C

## 2013-07-20 NOTE — Anesthesia Postprocedure Evaluation (Signed)
  Anesthesia Post-op Note  Patient: James Maxwell  Procedure(s) Performed: Procedure(s) (LRB): LEFT TOTAL KNEE ARTHROPLASTY (Left)  Patient Location: PACU  Anesthesia Type: Spinal  Level of Consciousness: awake and alert   Airway and Oxygen Therapy: Patient Spontanous Breathing  Post-op Pain: mild  Post-op Assessment: Post-op Vital signs reviewed, Patient's Cardiovascular Status Stable, Respiratory Function Stable, Patent Airway and No signs of Nausea or vomiting  Last Vitals:  Filed Vitals:   07/20/13 1830  BP: 129/85  Pulse: 56  Temp:   Resp: 16    Post-op Vital Signs: stable   Complications: No apparent anesthesia complications

## 2013-07-21 ENCOUNTER — Encounter (HOSPITAL_COMMUNITY): Payer: Self-pay | Admitting: Orthopedic Surgery

## 2013-07-21 DIAGNOSIS — K219 Gastro-esophageal reflux disease without esophagitis: Secondary | ICD-10-CM

## 2013-07-21 LAB — BASIC METABOLIC PANEL
BUN: 9 mg/dL (ref 6–23)
CHLORIDE: 102 meq/L (ref 96–112)
CO2: 25 meq/L (ref 19–32)
Calcium: 8.5 mg/dL (ref 8.4–10.5)
Creatinine, Ser: 0.92 mg/dL (ref 0.50–1.35)
GFR calc non Af Amer: 89 mL/min — ABNORMAL LOW (ref >90)
Glucose, Bld: 222 mg/dL — ABNORMAL HIGH (ref 70–99)
Potassium: 4.3 mEq/L (ref 3.7–5.3)
Sodium: 140 mEq/L (ref 137–147)

## 2013-07-21 LAB — CBC
HEMATOCRIT: 40.1 % (ref 39.0–52.0)
Hemoglobin: 13.2 g/dL (ref 13.0–17.0)
MCH: 27.8 pg (ref 26.0–34.0)
MCHC: 32.9 g/dL (ref 30.0–36.0)
MCV: 84.4 fL (ref 78.0–100.0)
PLATELETS: 262 10*3/uL (ref 150–400)
RBC: 4.75 MIL/uL (ref 4.22–5.81)
RDW: 13.3 % (ref 11.5–15.5)
WBC: 8.4 10*3/uL (ref 4.0–10.5)

## 2013-07-21 LAB — GLUCOSE, CAPILLARY
GLUCOSE-CAPILLARY: 177 mg/dL — AB (ref 70–99)
GLUCOSE-CAPILLARY: 129 mg/dL — AB (ref 70–99)
Glucose-Capillary: 146 mg/dL — ABNORMAL HIGH (ref 70–99)
Glucose-Capillary: 134 mg/dL — ABNORMAL HIGH (ref 70–99)

## 2013-07-21 MED ORDER — TEMAZEPAM 15 MG PO CAPS
15.0000 mg | ORAL_CAPSULE | Freq: Every evening | ORAL | Status: DC | PRN
  Administered 2013-07-21: 15 mg via ORAL
  Filled 2013-07-21: qty 1

## 2013-07-21 MED ORDER — POLYSACCHARIDE IRON COMPLEX 150 MG PO CAPS
150.0000 mg | ORAL_CAPSULE | Freq: Every day | ORAL | Status: DC
  Administered 2013-07-21 – 2013-07-22 (×2): 150 mg via ORAL
  Filled 2013-07-21 (×2): qty 1

## 2013-07-21 MED ORDER — OXYCODONE HCL 5 MG PO TABS
5.0000 mg | ORAL_TABLET | ORAL | Status: DC | PRN
  Administered 2013-07-21 – 2013-07-22 (×6): 15 mg via ORAL
  Administered 2013-07-22: 10 mg via ORAL
  Filled 2013-07-21 (×7): qty 3

## 2013-07-21 NOTE — Evaluation (Signed)
Occupational Therapy Evaluation Patient Details Name: James Maxwell MRN: [id number] DOB: [birth year] Today's Date: 07/21/2013 Time: 8416-6063 OT Time Calculation (min): 24 min  OT Assessment / Plan / Recommendation History of present illness L tka   Clinical Impression   Pt is limited by pain this session but is familiar with ADL from previous surgery. Did need some cues for walker safety and safe use of 3in1. Will benefit from skilled OT services to increase safety and independence with self care tasks for d/c home with family.    OT Assessment  Patient needs continued OT Services    Follow Up Recommendations  No OT follow up;Supervision/Assistance - 24 hour    Barriers to Discharge      Equipment Recommendations  None recommended by OT    Recommendations for Other Services    Frequency  Min 2X/week    Precautions / Restrictions Precautions Precautions: Knee Required Braces or Orthoses: Knee Immobilizer - Left Knee Immobilizer - Left: Discontinue once straight leg raise with < 10 degree lag Restrictions Weight Bearing Restrictions: No   Pertinent Vitals/Pain 8/10 at rest 10/10 with activity; premedicated. Reposition, rest    ADL  Eating/Feeding: Independent Where Assessed - Eating/Feeding: Chair Grooming: Simulated;Wash/dry hands;Set up Where Assessed - Grooming: Unsupported sitting Upper Body Bathing: Simulated;Chest;Right arm;Left arm;Abdomen;Set up Where Assessed - Upper Body Bathing: Unsupported sitting Lower Body Bathing: Simulated;Moderate assistance Where Assessed - Lower Body Bathing: Supported sit to stand Upper Body Dressing: Simulated;Set up Where Assessed - Upper Body Dressing: Unsupported sitting Lower Body Dressing: Simulated;Maximal assistance (due to pain) Where Assessed - Lower Body Dressing: Supported sit to stand Toilet Transfer: Performed;Minimal Manufacturing systems engineer: Raised toilet seat with arms (or 3-in-1 over toilet) Toileting  - Clothing Manipulation and Hygiene: Simulated;Minimal assistance Where Assessed - Best boy and Hygiene: Sit to stand from 3-in-1 or toilet Equipment Used: Knee Immobilizer;Rolling walker ADL Comments: Pt states he has all AE from previous knee surgery and knows how to use it all. He has his 3in1 but did practice transfer on and off of it. Needed min cues for hand placement and backing up all the way to surface. Also needs cues for walker distance to self. He plans to sponge bathe initially.     OT Diagnosis: Generalized weakness;Acute pain  OT Problem List: Decreased strength;Decreased knowledge of use of DME or AE OT Treatment Interventions: Self-care/ADL training;Patient/family education;Therapeutic activities;DME and/or AE instruction   OT Goals(Current goals can be found in the care plan section) Acute Rehab OT Goals Patient Stated Goal: I want to walk without pain OT Goal Formulation: With patient Time For Goal Achievement: 07/28/13 Potential to Achieve Goals: Good  Visit Information  Last OT Received On: 07/21/13 Assistance Needed: +1 History of Present Illness: L tka       Prior [place] expects to be discharged to:: Private residence Living Arrangements: Spouse/significant other Available Help at Discharge: Family Type of Home: House Home Access: Stairs to enter Technical brewer of Steps: 5 Entrance Stairs-Rails: Right;Left Home Layout: Two level;Bed/bath upstairs Alternate Level Stairs-Number of Steps: 7 Alternate Level Stairs-Rails: Left Home Equipment: Crutches;Cane - single point;Bedside commode;Adaptive equipment (forearm crutches and axillary) Adaptive Equipment: Reacher;Sock aid;Long-handled shoe horn;Long-handled sponge Communication Communication: No difficulties         Vision/Perception     Cognition  Cognition Arousal/Alertness: Awake/alert Behavior During Therapy: WFL for tasks  assessed/performed Overall Cognitive Status: Within Functional Limits for tasks assessed    Extremity/Trunk  Assessment Upper Extremity Assessment Upper Extremity Assessment: Overall WFL for tasks assessed     Mobility Bed Mobility Overal bed mobility: Needs Assistance Bed Mobility: Sit to Supine Sit to supine: Min assist General bed mobility comments: assist for L LE onto bed Transfers Overall transfer level: Needs assistance Equipment used: Rolling walker (2 wheeled) Transfers: Sit to/from Stand Sit to Stand: Min assist General transfer comment: verbal cues for hand placement        Balance     End of Session OT - End of Session Equipment Utilized During Treatment: Gait belt;Left knee immobilizer Activity Tolerance: Patient limited by pain Patient left: in bed;with call bell/phone within reach;with family/visitor present  GO     Jules Schick [phone] 07/21/2013, 12:39 PM

## 2013-07-21 NOTE — Progress Notes (Signed)
   Subjective: 1 Day Post-Op Procedure(s) (LRB): LEFT TOTAL KNEE ARTHROPLASTY (Left) Patient reports pain as moderate.   Patient seen in rounds with Dr. Wynelle Link. He had a rough night but sitting up on the side of the bed this morning eating breakfast. Patient is well, but has had some minor complaints of pain in the knee, requiring pain medications We will start therapy today.  Plan is to go Home after hospital stay.  Objective: Vital signs in last 24 hours: Temp:  [97.3 F (36.3 C)-98.2 F (36.8 C)] 97.9 F (36.6 C) (03/03 0500) Pulse Rate:  [52-64] 60 (03/03 0500) Resp:  [14-21] 20 (03/03 0500) BP: (119-149)/(64-94) 138/74 mmHg (03/03 0500) SpO2:  [95 %-99 %] 99 % (03/03 0500) Weight:  [117.482 kg (259 lb)] 117.482 kg (259 lb) (03/02 2244)  Intake/Output from previous day:  Intake/Output Summary (Last 24 hours) at 07/21/13 0818 Last data filed at 07/21/13 0745  Gross per 24 hour  Intake   3485 ml  Output   2250 ml  Net   1235 ml    Intake/Output this shift: UOP 900 since MN +1235  Labs:  Recent Labs  07/21/13 0502  HGB 13.2    Recent Labs  07/21/13 0502  WBC 8.4  RBC 4.75  HCT 40.1  PLT 262    Recent Labs  07/21/13 0502  NA 140  K 4.3  CL 102  CO2 25  BUN 9  CREATININE 0.92  GLUCOSE 222*  CALCIUM 8.5   No results found for this basename: LABPT, INR,  in the last 72 hours  EXAM General - Patient is Alert, Appropriate and Oriented Extremity - Neurovascular intact Sensation intact distally Dorsiflexion/Plantar flexion intact Dressing - dressing C/D/I Motor Function - intact, moving foot and toes well on exam.  Hemovac pulled without difficulty.  Past Medical History  Diagnosis Date  . Hypertension   . Diabetes mellitus without complication   . Arthritis   . MI, old 31  . CHF (congestive heart failure) 2004  . Coronary artery disease   . Stented coronary artery   . History of gout   . GERD (gastroesophageal reflux disease)      Assessment/Plan: 1 Day Post-Op Procedure(s) (LRB): LEFT TOTAL KNEE ARTHROPLASTY (Left) Principal Problem:   OA (osteoarthritis) of knee Active Problems:   Hypertension - resumed Caduet combo medication   CHF (congestive heart failure) - very good UOP and +1200.  Continue to monitor   Diabetes mellitus without complication - Glucophage on hold initially postop. Resumed Invokana.   Coronary atherosclerosis of native coronary artery - atenolol.  Plavix on hold while on Xarelto   GERD (gastroesophageal reflux disease) - resumed Protonix  Estimated body mass index is 38.23 kg/(m^2) as calculated from the following:   Height as of this encounter: 5\' 9"  (1.753 m).   Weight as of this encounter: 117.482 kg (259 lb). Up with therapy Plan for discharge tomorrow Discharge home with home health  DVT Prophylaxis - Xarelto Weight-Bearing as tolerated to left leg D/C O2 and Pulse OX and try on Room Air  PERKINS, Augusto Garbe 07/21/2013, 8:18 AM

## 2013-07-21 NOTE — Progress Notes (Signed)
Utilization review completed.  

## 2013-07-21 NOTE — Discharge Instructions (Addendum)
° °Dr. Frank Aluisio °Total Joint Specialist °[facility] °[address] °[place], [place] [address] °[phone] ° °TOTAL KNEE REPLACEMENT POSTOPERATIVE DIRECTIONS ° ° ° °Knee Rehabilitation, Guidelines Following Surgery  °Results after knee surgery are often greatly improved when you follow the exercise, range of motion and muscle strengthening exercises prescribed by your doctor. Safety measures are also important to protect the knee from further injury. Any time any of these exercises cause you to have increased pain or swelling in your knee joint, decrease the amount until you are comfortable again and slowly increase them. If you have problems or questions, call your caregiver or physical therapist for advice.  ° °HOME CARE INSTRUCTIONS  °Remove items at home which could result in a fall. This includes throw rugs or furniture in walking pathways.  °Continue medications as instructed at time of discharge. °You may have some home medications which will be placed on hold until you complete the course of blood thinner medication.  °You may start showering once you are discharged home but do not submerge the incision under water. Just pat the incision dry and apply a dry gauze dressing on daily. °Walk with walker as instructed.  °You may resume a sexual relationship in one month or when given the OK by  your doctor.  °· Use walker as long as suggested by your caregivers. °· Avoid periods of inactivity such as sitting longer than an hour when not asleep. This helps prevent blood clots.  °You may put full weight on your legs and walk as much as is comfortable.  °You may return to work once you are cleared by your doctor.  °Do not drive a car for 6 weeks or until released by you surgeon.  °· Do not drive while taking narcotics.  °Wear the elastic stockings for three weeks following surgery during the day but you may remove then at night. °Make sure you keep all of your appointments after your  operation with all of your doctors and caregivers. You should call the office at the above phone number and make an appointment for approximately two weeks after the date of your surgery. °Change the dressing daily and reapply a dry dressing each time. °Please pick up a stool softener and laxative for home use as long as you are requiring pain medications. °· Continue to use ice on the knee for pain and swelling from surgery. You may notice swelling that will progress down to the foot and ankle.  This is normal after surgery.  Elevate the leg when you are not up walking on it.   °It is important for you to complete the blood thinner medication as prescribed by your doctor. °· Continue to use the breathing machine which will help keep your temperature down.  It is common for your temperature to cycle up and down following surgery, especially at night when you are not up moving around and exerting yourself.  The breathing machine keeps your lungs expanded and your temperature down. ° °RANGE OF MOTION AND STRENGTHENING EXERCISES  °Rehabilitation of the knee is important following a knee injury or an operation. After just a few days of immobilization, the muscles of the thigh which control the knee become weakened and shrink (atrophy). Knee exercises are designed to build up the tone and strength of the thigh muscles and to improve knee motion. Often times heat used for twenty to thirty minutes before working out will loosen up your tissues and help with improving the   range of motion but do not use heat for the first two weeks following surgery. These exercises can be done on a training (exercise) mat, on the floor, on a table or on a bed. Use what ever works the best and is most comfortable for you Knee exercises include:  Leg Lifts - While your knee is still immobilized in a splint or cast, you can do straight leg raises. Lift the leg to 60 degrees, hold for 3 sec, and slowly lower the leg. Repeat 10-20 times 2-3  times daily. Perform this exercise against resistance later as your knee gets better.  Quad and Hamstring Sets - Tighten up the muscle on the front of the thigh (Quad) and hold for 5-10 sec. Repeat this 10-20 times hourly. Hamstring sets are done by pushing the foot backward against an object and holding for 5-10 sec. Repeat as with quad sets.  A rehabilitation program following serious knee injuries can speed recovery and prevent re-injury in the future due to weakened muscles. Contact your doctor or a physical therapist for more information on knee rehabilitation.   SKILLED REHAB INSTRUCTIONS: If the patient is transferred to a skilled rehab facility following release from the hospital, a list of the current medications will be sent to the facility for the patient to continue.  When discharged from the skilled rehab facility, please have the facility set up the patient's [place] prior to being released. Also, the skilled facility will be responsible for providing the patient with their medications at time of release from the facility to include their pain medication, the muscle relaxants, and their blood thinner medication. If the patient is still at the rehab facility at time of the two week follow up appointment, the skilled rehab facility will also need to assist the patient in arranging follow up appointment in our office and any transportation needs.  MAKE SURE YOU:  Understand these instructions.  Will watch your condition.  Will get help right away if you are not doing well or get worse.    Pick up stool softner and laxative for home. Do not submerge incision under water. May shower. Continue to use ice for pain and swelling from surgery.  Take Xarelto for two and a half more weeks, then discontinue Xarelto. Once the patient has completed the Xarelto, they may resume the 325 mg Aspirin and the Plavix 75 mg   Information on my medicine - XARELTO  (Rivaroxaban)  This medication education was reviewed with me or my healthcare representative as part of my discharge preparation.  The pharmacist that spoke with me during my hospital stay was:  Absher, Julieta Bellini, RPH  Why was Xarelto prescribed for you? Xarelto was prescribed for you to reduce the risk of blood clots forming after orthopedic surgery. The medical term for these abnormal blood clots is venous thromboembolism (VTE).  What do you need to know about xarelto ? Take your Xarelto ONCE DAILY at the same time every day. You may take it either with or without food.  If you have difficulty swallowing the tablet whole, you may crush it and mix in applesauce just prior to taking your dose.  Take Xarelto exactly as prescribed by your doctor and DO NOT stop taking Xarelto without talking to the doctor who prescribed the medication.  Stopping without other VTE prevention medication to take the place of Xarelto may increase your risk of developing a clot.  After discharge, you should have regular check-up appointments with  your healthcare provider that is prescribing your Xarelto.    What do you do if you miss a dose? If you miss a dose, take it as soon as you remember on the same day then continue your regularly scheduled once daily regimen the next day. Do not take two doses of Xarelto on the same day.   Important Safety Information A possible side effect of Xarelto is bleeding. You should call your healthcare provider right away if you experience any of the following:   Bleeding from an injury or your nose that does not stop.   Unusual colored urine (red or dark brown) or unusual colored stools (red or black).   Unusual bruising for unknown reasons.   A serious fall or if you hit your head (even if there is no bleeding).  Some medicines may interact with Xarelto and might increase your risk of bleeding while on Xarelto. To help avoid this, consult your healthcare provider  or pharmacist prior to using any new prescription or non-prescription medications, including herbals, vitamins, non-steroidal anti-inflammatory drugs (NSAIDs) and supplements.  This website has more information on Xarelto: [email or web address]

## 2013-07-21 NOTE — Progress Notes (Signed)
Physical Therapy Treatment Patient Details Name: James Maxwell MRN: [id number] DOB: 26-Dec-1950 Today's Date: 07/21/2013 Time: 2130-8657 PT Time Calculation (min): 11 min  PT Assessment / Plan / Recommendation  History of Present Illness L tka   PT Comments   Pt's pain better in control. Progressing.  Follow Up Recommendations  Home health PT     Does the patient have the potential to tolerate intense rehabilitation     Barriers to Discharge        Equipment Recommendations  None recommended by PT    Recommendations for Other Services    Frequency 7X/week   Progress towards PT Goals Progress towards PT goals: Progressing toward goals  Plan Current plan remains appropriate    Precautions / Restrictions Precautions Precautions: Knee Required Braces or Orthoses: Knee Immobilizer - Left Knee Immobilizer - Left: Discontinue once straight leg raise with < 10 degree lag Restrictions Weight Bearing Restrictions: No   Pertinent Vitals/Pain 2 , slight increase in pain with weight    Mobility  Bed Mobility Overal bed mobility: Needs Assistance  Bed Mobility: Sit to Supine Supine to sit: Min assist Sit to supine: Min assist General bed mobility comments: assist for L LE to the floor Transfers Overall transfer level: Needs assistance Equipment used: Rolling walker (2 wheeled) Transfers: Sit to/from Stand Sit to Stand: Min assist General transfer comment: verbal cues for hand placement Ambulation/Gait Ambulation/Gait assistance: Min assist Ambulation Distance (Feet): 175 Feet Assistive device: Rolling walker (2 wheeled) Gait Pattern/deviations: Step-to pattern;Antalgic General Gait Details: cues for sequence, step length    Exercises T   PT Diagnosis: Difficulty walking;Acute pain  PT Problem List: Decreased strength;Decreased range of motion;Decreased activity tolerance;Decreased mobility;Decreased knowledge of precautions;Decreased safety awareness;Decreased knowledge  of use of DME;Pain PT Treatment Interventions: DME instruction;Gait training;Stair training;Functional mobility training;Therapeutic activities;Therapeutic exercise;Patient/family education   PT Goals (current goals can now be found in the care plan section) Acute Rehab PT Goals Patient Stated Goal: I want to walk without pain PT Goal Formulation: With patient Time For Goal Achievement: 07/25/13 Potential to Achieve Goals: Good  Visit Information  Last PT Received On: 07/21/13 Assistance Needed: +1 History of Present Illness: L tka    Subjective Data  Patient Stated Goal: I want to walk without pain   Cognition  Cognition Arousal/Alertness: Awake/alert Behavior During Therapy: WFL for tasks assessed/performed Overall Cognitive Status: Within Functional Limits for tasks assessed    Balance     End of Session PT - End of Session Equipment Utilized During Treatment: Left knee immobilizer Activity Tolerance: Patient tolerated treatment well Patient left: in bed;with call bell/phone within reach Nurse Communication: Mobility status   GP     [name] 07/21/2013, 3:31 PM

## 2013-07-21 NOTE — Progress Notes (Addendum)
   CARE MANAGEMENT NOTE 07/21/2013  Patient:  NOBEL, BRAR   Account Number:  [id number]  Date Initiated:  07/21/2013  Documentation initiated by:  [facility]  Subjective/Objective Assessment:   LEFT TOTAL KNEE ARTHROPLASTY     Action/Plan:   [place]   Anticipated DC Date:  07/23/2013   Anticipated DC Plan:  [place]  CM consult      [facility] Choice  HOME HEALTH   Choice offered to / List presented to:  C-1 Patient        [place] arranged  HH-2 PT      [place]   Status of service:  Completed, signed off Medicare Important Message given?   (If response is "NO", the following Medicare IM given date fields will be blank) Date Medicare IM given:   Date Additional Medicare IM given:    Discharge Disposition:  [place]  Per UR Regulation:    If discussed at Long Length of Stay Meetings, dates discussed:    Comments:  07/21/2013 11:09 AM NCM spoke to pt and offered choice for [facility]. Pt agreeable to [facility] for [facility]. States he has RW, and 3n1 at home. [facility] notified. [name] Finner RN CCM Case Mgmt phone [phone]

## 2013-07-21 NOTE — Evaluation (Signed)
Physical Therapy Evaluation Patient Details Name: James Maxwell MRN: [id number] DOB: [birth year] Today's Date: 07/21/2013 Time: 0350-0938 PT Time Calculation (min): 40 min  PT Assessment / Plan / Recommendation History of Present Illness  L tka  Clinical Impression  Pt tolerated  Well inspite of pain at 10. Pt will benefit from PT to address problems listed.    PT Assessment  Patient needs continued PT services    Follow Up Recommendations  Home health PT    Does the patient have the potential to tolerate intense rehabilitation      Barriers to Discharge        Equipment Recommendations  Rolling walker with 5" wheels    Recommendations for Other Services     Frequency 7X/week    Precautions / Restrictions Precautions Precautions: Knee Required Braces or Orthoses: Knee Immobilizer - Left Knee Immobilizer - Left: Discontinue once straight leg raise with < 10 degree lag   Pertinent Vitals/Pain 5- 10 with activity      Mobility  Bed Mobility Overal bed mobility: Needs Assistance Bed Mobility: Supine to Sit Supine to sit: Min assist General bed mobility comments: LLE support  to edge of the bed Transfers Overall transfer level: Needs assistance Equipment used: Rolling walker (2 wheeled) Transfers: Sit to/from Stand Sit to Stand: Min assist;From elevated surface General transfer comment: cues for hand and LLE position, Ambulation/Gait Ambulation/Gait assistance: Min assist Ambulation Distance (Feet): 100 Feet Assistive device: Rolling walker (2 wheeled) Gait Pattern/deviations: Step-to pattern;Antalgic General Gait Details: cues for sequence, step length    Exercises Total Joint Exercises Ankle Circles/Pumps: AROM;Both;10 reps;Supine Quad Sets: AROM;Left;10 reps;Supine Heel Slides: AAROM;Left;5 reps;Supine Straight Leg Raises: AAROM;Left;5 reps;Supine   PT Diagnosis: Difficulty walking;Acute pain  PT Problem List: Decreased strength;Decreased range of  motion;Decreased activity tolerance;Decreased mobility;Decreased knowledge of precautions;Decreased safety awareness;Decreased knowledge of use of DME;Pain PT Treatment Interventions: DME instruction;Gait training;Stair training;Functional mobility training;Therapeutic activities;Therapeutic exercise;Patient/family education     PT Goals(Current goals can be found in the care plan section) Acute Rehab PT Goals Patient Stated Goal: I want to walk without pain PT Goal Formulation: With patient Time For Goal Achievement: 07/25/13 Potential to Achieve Goals: Good  Visit Information  Last PT Received On: 07/21/13 Assistance Needed: +1 History of Present Illness: L tka       Prior Functioning  Home Living Family/patient expects to be discharged to:: Private residence Living Arrangements: Spouse/significant other Available Help at Discharge: Family Type of Home: House Home Access: Stairs to enter Technical brewer of Steps: 5 Entrance Stairs-Rails: Right;Left Home Layout: Two level;Bed/bath upstairs Alternate Level Stairs-Number of Steps: 7 Alternate Level Stairs-Rails: Left Home Equipment: Environmental consultant - 2 wheels    Cognition  Cognition Arousal/Alertness: Awake/alert Behavior During Therapy: WFL for tasks assessed/performed Overall Cognitive Status: Within Functional Limits for tasks assessed    Extremity/Trunk Assessment Upper Extremity Assessment Upper Extremity Assessment: Defer to OT evaluation Lower Extremity Assessment Lower Extremity Assessment: LLE deficits/detail LLE Deficits / Details: able to perform SLR with effort   Balance    End of Session PT - End of Session Equipment Utilized During Treatment: Gait belt;Left knee immobilizer Activity Tolerance: Patient limited by pain Patient left: in chair;with call bell/phone within reach Nurse Communication: Mobility status;Patient requests pain meds CPM Left Knee CPM Left Knee: Off  GP     [name] 07/21/2013, 11:44 AM [phone]

## 2013-07-22 LAB — CBC
HEMATOCRIT: 39.5 % (ref 39.0–52.0)
Hemoglobin: 13.4 g/dL (ref 13.0–17.0)
MCH: 28.5 pg (ref 26.0–34.0)
MCHC: 33.9 g/dL (ref 30.0–36.0)
MCV: 84 fL (ref 78.0–100.0)
PLATELETS: 269 10*3/uL (ref 150–400)
RBC: 4.7 MIL/uL (ref 4.22–5.81)
RDW: 13.3 % (ref 11.5–15.5)
WBC: 13.2 10*3/uL — ABNORMAL HIGH (ref 4.0–10.5)

## 2013-07-22 LAB — BASIC METABOLIC PANEL
BUN: 10 mg/dL (ref 6–23)
CO2: 25 mEq/L (ref 19–32)
Calcium: 8.8 mg/dL (ref 8.4–10.5)
Chloride: 93 mEq/L — ABNORMAL LOW (ref 96–112)
Creatinine, Ser: 1.03 mg/dL (ref 0.50–1.35)
GFR, EST AFRICAN AMERICAN: 88 mL/min — AB (ref >90)
GFR, EST NON AFRICAN AMERICAN: 76 mL/min — AB (ref >90)
Glucose, Bld: 147 mg/dL — ABNORMAL HIGH (ref 70–99)
POTASSIUM: 4.1 meq/L (ref 3.7–5.3)
SODIUM: 132 meq/L — AB (ref 137–147)

## 2013-07-22 LAB — GLUCOSE, CAPILLARY
Glucose-Capillary: 175 mg/dL — ABNORMAL HIGH (ref 70–99)
Glucose-Capillary: 177 mg/dL — ABNORMAL HIGH (ref 70–99)

## 2013-07-22 MED ORDER — METHOCARBAMOL 500 MG PO TABS: 500.0000 mg | ORAL_TABLET | Freq: Four times a day (QID) | ORAL | Status: DC | PRN

## 2013-07-22 MED ORDER — OXYCODONE HCL 5 MG PO TABS: 5.0000 mg | ORAL_TABLET | ORAL | Status: DC | PRN

## 2013-07-22 MED ORDER — TRAMADOL HCL 50 MG PO TABS: 50.0000 mg | ORAL_TABLET | Freq: Four times a day (QID) | ORAL | Status: DC | PRN

## 2013-07-22 MED ORDER — RIVAROXABAN 10 MG PO TABS: 10.0000 mg | ORAL_TABLET | Freq: Every day | ORAL | Status: DC

## 2013-07-22 MED ORDER — POLYSACCHARIDE IRON COMPLEX 150 MG PO CAPS: 150.0000 mg | ORAL_CAPSULE | Freq: Every day | ORAL | Status: DC

## 2013-07-22 NOTE — Progress Notes (Signed)
Physical Therapy Treatment Patient Details Name: James Maxwell MRN: [id number] DOB: 1950/12/23 Today's Date: 07/22/2013 Time: 6754-4920 PT Time Calculation (min): 19 min  PT Assessment / Plan / Recommendation  History of Present Illness L tka   PT Comments   Pt performed steps well. Plans for DC.  Follow Up Recommendations  Home health PT     Does the patient have the potential to tolerate intense rehabilitation     Barriers to Discharge        Equipment Recommendations  None recommended by PT    Recommendations for Other Services    Frequency 7X/week   Progress towards PT Goals Progress towards PT goals: Progressing toward goals  Plan Current plan remains appropriate    Precautions / Restrictions Precautions Precautions: Knee Required Braces or Orthoses: Knee Immobilizer - Left Knee Immobilizer - Left: Discontinue once straight leg raise with < 10 degree lag   Pertinent Vitals/Pain < 5 L knee    Mobility  Transfers Overall transfer level: Needs assistance Equipment used: Rolling walker (2 wheeled) Transfers: Sit to/from Stand Sit to Stand: Supervision General transfer comment: verbal cues for hand placement Ambulation/Gait Ambulation/Gait assistance: Supervision Ambulation Distance (Feet): 50 Feet Assistive device: Rolling walker (2 wheeled) Gait Pattern/deviations: Step-through pattern;Antalgic General Gait Details: cues for sequence, step length Stairs: Yes Stairs assistance: Min assist Stair Management: Forwards;With crutches;One rail Left;Step to pattern Number of Stairs: 4 General stair comments: cues for safety    Exercises Total Joint Exercises Quad Sets: AROM;Left;10 reps;Supine Heel Slides: Left;10 reps;Supine;AAROM Straight Leg Raises: AAROM;Left;10 reps;Supine   PT Diagnosis:    PT Problem List:   PT Treatment Interventions:     PT Goals (current goals can now be found in the care plan section)    Visit Information  Last PT Received On:  07/22/13 Assistance Needed: +1 History of Present Illness: L tka    Subjective Data      Cognition  Cognition Arousal/Alertness: Awake/alert    Balance     End of Session PT - End of Session Equipment Utilized During Treatment: Left knee immobilizer Activity Tolerance: Patient tolerated treatment well Patient left: in chair;with call bell/phone within reach;with family/visitor present Nurse Communication: Mobility status   GP     James Maxwell 07/22/2013, 12:59 PM

## 2013-07-22 NOTE — Progress Notes (Signed)
OT Cancellation Note  Patient Details Name: James Maxwell MRN: [id number] DOB: 04-Apr-1951   Cancelled Treatment:    Reason Eval/Treat Not Completed: Other (comment) Pt states he feels ok with toilet transfer onto 3in1 and he declines need to practice again for d/c. He states pain is much improved today. He plans to sponge initially and has AE and knows how to use. Pt states no further OT needs.  Jules Schick [phone] 07/22/2013, 11:27 AM

## 2013-07-22 NOTE — Progress Notes (Signed)
   Subjective: 2 Days Post-Op Procedure(s) (LRB): LEFT TOTAL KNEE ARTHROPLASTY (Left) Patient reports pain as mild.   Patient seen in rounds for Dr. Wynelle Link. Patient is well, and has had no acute complaints or problems Patient is ready to go home  Objective: Vital signs in last 24 hours: Temp:  [99 F (37.2 C)] 99 F (37.2 C) (03/04 0455) Pulse Rate:  [69-83] 83 (03/04 0455) Resp:  [16-18] 17 (03/04 0757) BP: (146-156)/(74-85) 156/85 mmHg (03/04 0455) SpO2:  [95 %-99 %] 99 % (03/04 0455)  Intake/Output from previous day:  Intake/Output Summary (Last 24 hours) at 07/22/13 1421 Last data filed at 07/22/13 0920  Gross per 24 hour  Intake   3040 ml  Output   2525 ml  Net    515 ml    Intake/Output this shift: Total I/O In: 240 [P.O.:240] Out: -   Labs:  Recent Labs  07/21/13 0502 07/22/13 0425  HGB 13.2 13.4    Recent Labs  07/21/13 0502 07/22/13 0425  WBC 8.4 13.2*  RBC 4.75 4.70  HCT 40.1 39.5  PLT 262 269    Recent Labs  07/21/13 0502 07/22/13 0425  NA 140 132*  K 4.3 4.1  CL 102 93*  CO2 25 25  BUN 9 10  CREATININE 0.92 1.03  GLUCOSE 222* 147*  CALCIUM 8.5 8.8   No results found for this basename: LABPT, INR,  in the last 72 hours  EXAM: General - Patient is Alert, Appropriate and Oriented Extremity - Neurovascular intact Sensation intact distally Incision - dry, no drainage Motor Function - intact, moving foot and toes well on exam.   Assessment/Plan: 2 Days Post-Op Procedure(s) (LRB): LEFT TOTAL KNEE ARTHROPLASTY (Left) Procedure(s) (LRB): LEFT TOTAL KNEE ARTHROPLASTY (Left) Past Medical History  Diagnosis Date  . Hypertension   . Diabetes mellitus without complication   . Arthritis   . MI, old 30  . CHF (congestive heart failure) 2004  . Coronary artery disease   . Stented coronary artery   . History of gout   . GERD (gastroesophageal reflux disease)    Principal Problem:   OA (osteoarthritis) of knee Active Problems:   Hypertension   CHF (congestive heart failure)   Diabetes mellitus without complication   Coronary atherosclerosis of native coronary artery   GERD (gastroesophageal reflux disease)  Estimated body mass index is 38.23 kg/(m^2) as calculated from the following:   Height as of this encounter: 5\' 9"  (1.753 m).   Weight as of this encounter: 117.482 kg (259 lb). Up with therapy Discharge home with home health Diet - Cardiac diet and Diabetic diet Follow up - in 2 weeks Activity - WBAT Disposition - Home Condition Upon Discharge - Good D/C Meds - See DC Summary DVT Prophylaxis - Xarelto  Dexter Sauser 07/22/2013, 2:21 PM

## 2013-07-22 NOTE — Progress Notes (Signed)
Rn reviewed discharge instructions with patient and family. All questions answered.   Paperwork, prescriptions, and gauze given to patient.   NT discharged patient to family car.

## 2013-07-22 NOTE — Discharge Summary (Signed)
Physician Discharge Summary   Patient ID: James Maxwell MRN: [id number] DOB/AGE: [birth year] 63 y.o.  Admit date: 07/20/2013 Discharge date: 07-22-2013  Primary Diagnosis:  Osteoarthritis Left knee(s)  Admission Diagnoses:  Past Medical History  Diagnosis Date  . Hypertension   . Diabetes mellitus without complication   . Arthritis   . MI, old 56  . CHF (congestive heart failure) 2004  . Coronary artery disease   . Stented coronary artery   . History of gout   . GERD (gastroesophageal reflux disease)    Discharge Diagnoses:   Principal Problem:   OA (osteoarthritis) of knee Active Problems:   Hypertension   CHF (congestive heart failure)   Diabetes mellitus without complication   Coronary atherosclerosis of native coronary artery   GERD (gastroesophageal reflux disease)  Estimated body mass index is 38.23 kg/(m^2) as calculated from the following:   Height as of this encounter: 5' 9"  (1.753 m).   Weight as of this encounter: 117.482 kg (259 lb).  Procedure:  Procedure(s) (LRB): LEFT TOTAL KNEE ARTHROPLASTY (Left)   Consults: None  HPI: James Maxwell is a 63 y.o. year old male with end stage OA of his left knee with progressively worsening pain and dysfunction. He has constant pain, with activity and at rest and significant functional deficits with difficulties even with ADLs. He has had extensive non-op management including analgesics, injections of cortisone and viscosupplements, and home exercise program, but remains in significant pain with significant dysfunction. Radiographs show bone on bone arthritis medial and patellofemoral with varus deformity. He presents now for left Total Knee Arthroplasty.  Laboratory Data: Admission on 07/20/2013  Component Date Value Ref Range Status  . Glucose-Capillary 07/20/2013 119* 70 - 99 mg/dL Final  . Glucose-Capillary 07/20/2013 107* 70 - 99 mg/dL Final  . Comment 1 07/20/2013 Documented in Chart   Final  . Comment 2  07/20/2013 Notify RN   Final  . WBC 07/21/2013 8.4  4.0 - 10.5 K/uL Final  . RBC 07/21/2013 4.75  4.22 - 5.81 MIL/uL Final  . Hemoglobin 07/21/2013 13.2  13.0 - 17.0 g/dL Final  . HCT 07/21/2013 40.1  39.0 - 52.0 % Final  . MCV 07/21/2013 84.4  78.0 - 100.0 fL Final  . MCH 07/21/2013 27.8  26.0 - 34.0 pg Final  . MCHC 07/21/2013 32.9  30.0 - 36.0 g/dL Final  . RDW 07/21/2013 13.3  11.5 - 15.5 % Final  . Platelets 07/21/2013 262  150 - 400 K/uL Final  . Sodium 07/21/2013 140  137 - 147 mEq/L Final  . Potassium 07/21/2013 4.3  3.7 - 5.3 mEq/L Final  . Chloride 07/21/2013 102  96 - 112 mEq/L Final  . CO2 07/21/2013 25  19 - 32 mEq/L Final  . Glucose, Bld 07/21/2013 222* 70 - 99 mg/dL Final  . BUN 07/21/2013 9  6 - 23 mg/dL Final  . Creatinine, Ser 07/21/2013 0.92  0.50 - 1.35 mg/dL Final  . Calcium 07/21/2013 8.5  8.4 - 10.5 mg/dL Final  . GFR calc non Af Amer 07/21/2013 89* >90 mL/min Final  . GFR calc Af Amer 07/21/2013 >90  >90 mL/min Final   Comment: (NOTE)                          The eGFR has been calculated using the CKD EPI equation.  This calculation has not been validated in all clinical situations.                          eGFR's persistently <90 mL/min signify possible Chronic Kidney                          Disease.  . Glucose-Capillary 07/20/2013 147* 70 - 99 mg/dL Final  . Comment 1 07/20/2013 Documented in Chart   Final  . Comment 2 07/20/2013 Notify RN   Final  . Glucose-Capillary 07/21/2013 146* 70 - 99 mg/dL Final  . Glucose-Capillary 07/21/2013 177* 70 - 99 mg/dL Final  . WBC 07/22/2013 13.2* 4.0 - 10.5 K/uL Final  . RBC 07/22/2013 4.70  4.22 - 5.81 MIL/uL Final  . Hemoglobin 07/22/2013 13.4  13.0 - 17.0 g/dL Final  . HCT 07/22/2013 39.5  39.0 - 52.0 % Final  . MCV 07/22/2013 84.0  78.0 - 100.0 fL Final  . MCH 07/22/2013 28.5  26.0 - 34.0 pg Final  . MCHC 07/22/2013 33.9  30.0 - 36.0 g/dL Final  . RDW 07/22/2013 13.3  11.5 - 15.5 % Final    . Platelets 07/22/2013 269  150 - 400 K/uL Final  . Sodium 07/22/2013 132* 137 - 147 mEq/L Final   Comment: REPEATED TO VERIFY                          DELTA CHECK NOTED  . Potassium 07/22/2013 4.1  3.7 - 5.3 mEq/L Final  . Chloride 07/22/2013 93* 96 - 112 mEq/L Final   Comment: REPEATED TO VERIFY                          DELTA CHECK NOTED  . CO2 07/22/2013 25  19 - 32 mEq/L Final  . Glucose, Bld 07/22/2013 147* 70 - 99 mg/dL Final  . BUN 07/22/2013 10  6 - 23 mg/dL Final  . Creatinine, Ser 07/22/2013 1.03  0.50 - 1.35 mg/dL Final  . Calcium 07/22/2013 8.8  8.4 - 10.5 mg/dL Final  . GFR calc non Af Amer 07/22/2013 76* >90 mL/min Final  . GFR calc Af Amer 07/22/2013 88* >90 mL/min Final   Comment: (NOTE)                          The eGFR has been calculated using the CKD EPI equation.                          This calculation has not been validated in all clinical situations.                          eGFR's persistently <90 mL/min signify possible Chronic Kidney                          Disease.  . Glucose-Capillary 07/21/2013 129* 70 - 99 mg/dL Final  . Glucose-Capillary 07/21/2013 134* 70 - 99 mg/dL Final  . Comment 1 07/21/2013 Notify RN   Final  . Glucose-Capillary 07/22/2013 175* 70 - 99 mg/dL Final  . Glucose-Capillary 07/22/2013 177* 70 - 99 mg/dL Final  Hospital Outpatient Visit on 07/15/2013  Component Date Value Ref Range Status  . MRSA, PCR 07/15/2013 NEGATIVE  NEGATIVE Final  . Staphylococcus aureus 07/15/2013 NEGATIVE  NEGATIVE Final   Comment:                                 The Xpert SA Assay (FDA                          approved for NASAL specimens                          in patients over 2 years of age),                          is one component of                          a comprehensive surveillance                          program.  Test performance has                          been validated by [employer] for patients greater                           than or equal to 78 year old.                          It is not intended                          to diagnose infection nor to                          guide or monitor treatment.  [name] aPTT 07/15/2013 27  24 - 37 seconds Final  . WBC 07/15/2013 8.3  4.0 - 10.5 K/uL Final  . RBC 07/15/2013 5.49  4.22 - 5.81 MIL/uL Final  . Hemoglobin 07/15/2013 15.5  13.0 - 17.0 g/dL Final  . HCT 07/15/2013 46.3  39.0 - 52.0 % Final  . MCV 07/15/2013 84.3  78.0 - 100.0 fL Final  . MCH 07/15/2013 28.2  26.0 - 34.0 pg Final  . MCHC 07/15/2013 33.5  30.0 - 36.0 g/dL Final  . RDW 07/15/2013 13.4  11.5 - 15.5 % Final  . Platelets 07/15/2013 288  150 - 400 K/uL Final  . Sodium 07/15/2013 139  137 - 147 mEq/L Final  . Potassium 07/15/2013 4.8  3.7 - 5.3 mEq/L Final  . Chloride 07/15/2013 101  96 - 112 mEq/L Final  . CO2 07/15/2013 23  19 - 32 mEq/L Final  . Glucose, Bld 07/15/2013 114* 70 - 99 mg/dL Final  . BUN 07/15/2013 12  6 - 23 mg/dL Final  . Creatinine, Ser 07/15/2013 1.08  0.50 - 1.35 mg/dL Final  . Calcium 07/15/2013 9.9  8.4 - 10.5 mg/dL Final  . Total Protein 07/15/2013 8.0  6.0 - 8.3 g/dL Final  . Albumin 07/15/2013 3.8  3.5 - 5.2 g/dL Final  .  AST 07/15/2013 22  0 - 37 U/L Final  . ALT 07/15/2013 14  0 - 53 U/L Final  . Alkaline Phosphatase 07/15/2013 86  39 - 117 U/L Final  . Total Bilirubin 07/15/2013 0.7  0.3 - 1.2 mg/dL Final  . GFR calc non Af Amer 07/15/2013 72* >90 mL/min Final  . GFR calc Af Amer 07/15/2013 83* >90 mL/min Final   Comment: (NOTE)                          The eGFR has been calculated using the CKD EPI equation.                          This calculation has not been validated in all clinical situations.                          eGFR's persistently <90 mL/min signify possible Chronic Kidney                          Disease.  [name] Prothrombin Time 07/15/2013 12.9  11.6 - 15.2 seconds Final  . INR 07/15/2013 0.99  0.00 - 1.49 Final  . Color, Urine  07/15/2013 YELLOW  YELLOW Final  . APPearance 07/15/2013 CLEAR  CLEAR Final  . Specific Gravity, Urine 07/15/2013 1.029  1.005 - 1.030 Final  . pH 07/15/2013 5.0  5.0 - 8.0 Final  . Glucose, UA 07/15/2013 >1000* NEGATIVE mg/dL Final  . Hgb urine dipstick 07/15/2013 NEGATIVE  NEGATIVE Final  . Bilirubin Urine 07/15/2013 NEGATIVE  NEGATIVE Final  . Ketones, ur 07/15/2013 NEGATIVE  NEGATIVE mg/dL Final  . Protein, ur 07/15/2013 NEGATIVE  NEGATIVE mg/dL Final  . Urobilinogen, UA 07/15/2013 0.2  0.0 - 1.0 mg/dL Final  . Nitrite 07/15/2013 NEGATIVE  NEGATIVE Final  . Leukocytes, UA 07/15/2013 NEGATIVE  NEGATIVE Final  . ABO/RH(D) 07/15/2013 A POS   Final  . Antibody Screen 07/15/2013 NEG   Final  . Sample Expiration 07/15/2013 07/23/2013   Final  . Squamous Epithelial / LPF 07/15/2013 RARE  RARE Final  . WBC, UA 07/15/2013 0-2  <3 WBC/hpf Final  . Urine-Other 07/15/2013 MUCOUS PRESENT   Final  . ABO/RH(D) 07/15/2013 A POS   Final     X-Rays:Dg Chest 2 View  07/15/2013   CLINICAL DATA:  Preop for left knee replacement  EXAM: CHEST  2 VIEW  COMPARISON:  07/30/2005  FINDINGS: Cardiomediastinal silhouette is stable. No acute infiltrate or pleural effusion. No pulmonary edema. Mild degenerative changes thoracic spine.  IMPRESSION: No active cardiopulmonary disease.   Electronically Signed   By: Lahoma Crocker M.D.   On: 07/15/2013 11:39    EKG: Orders placed in visit on 05/25/13  . EKG 12-LEAD     Hospital Course: James Maxwell is a 63 y.o. who was admitted to [facility]. They were brought to the operating room on 07/20/2013 and underwent Procedure(s): LEFT TOTAL KNEE ARTHROPLASTY.  Patient tolerated the procedure well and was later transferred to the recovery room and then to the orthopaedic floor for postoperative care.  They were given PO and IV analgesics for pain control following their surgery.  They were given 24 hours of postoperative antibiotics of  Anti-infectives   Start      Dose/Rate Route Frequency Ordered Stop   07/20/13 2200  ceFAZolin (ANCEF) IVPB 2 g/50 mL  premix     2 g 100 mL/hr over 30 Minutes Intravenous Every 6 hours 07/20/13 1917 07/21/13 0520   07/20/13 1156  ceFAZolin (ANCEF) IVPB 2 g/50 mL premix     2 g 100 mL/hr over 30 Minutes Intravenous On call to O.R. 07/20/13 1156 07/20/13 1558     and started on DVT prophylaxis in the form of Xarelto.   PT and OT were ordered for total joint protocol.  Discharge planning consulted to help with postop disposition and equipment needs.  Patient had a rough night on the evening of surgery but was better the next day.  They started to get up OOB with therapy on day one. Hemovac drain was pulled without difficulty.  Continued to work with therapy into day two.  Dressing was changed on day two and the incision was healing well.  Patient was seen in rounds and was ready to go home.   Discharge Medications: Prior to Admission medications   Medication Sig Start Date End Date Taking? Authorizing Provider  amLODipine-atorvastatin (CADUET) 10-80 MG per tablet Take 1 tablet by mouth every morning.    Yes Historical Provider, MD  atenolol (TENORMIN) 100 MG tablet Take 100 mg by mouth every morning.    Yes Historical Provider, MD  Canagliflozin (INVOKANA) 100 MG TABS Take 100 mg by mouth every morning.    Yes Historical Provider, MD  lisinopril (PRINIVIL,ZESTRIL) 10 MG tablet Take 10 mg by mouth every morning.    Yes Historical Provider, MD  metFORMIN (GLUCOPHAGE) 1000 MG tablet Take 1 tablet (1,000 mg total) by mouth 2 (two) times daily with a meal. 02/03/13  Yes Hennie Duos, MD  pantoprazole (PROTONIX) 40 MG tablet Take 40 mg by mouth daily.   Yes Historical Provider, MD  potassium chloride SA (K-DUR,KLOR-CON) 20 MEQ tablet Take 20 mEq by mouth daily.   Yes Historical Provider, MD  iron polysaccharides (NIFEREX) 150 MG capsule Take 1 capsule (150 mg total) by mouth daily. 07/22/13   Marchelle Rinella Dara Lords, PA-C  methocarbamol  (ROBAXIN) 500 MG tablet Take 1 tablet (500 mg total) by mouth every 6 (six) hours as needed for muscle spasms. 07/22/13   Florida Nolton, PA-C  nitroGLYCERIN (NITROSTAT) 0.4 MG SL tablet Place 1 tablet (0.4 mg total) under the tongue every 5 (five) minutes as needed for chest pain. 05/01/13   Casandra Doffing, MD  oxyCODONE (OXY IR/ROXICODONE) 5 MG immediate release tablet Take 1-3 tablets (5-15 mg total) by mouth every 3 (three) hours as needed for moderate pain, severe pain or breakthrough pain. 07/22/13   Zacheriah Stumpe Dara Lords, PA-C  rivaroxaban (XARELTO) 10 MG TABS tablet Take 1 tablet (10 mg total) by mouth daily with breakfast. Take Xarelto for two and a half more weeks, then discontinue Xarelto. Once the patient has completed the Xarelto, they may resume the 325 mg Aspirin and the Plavix 75 mg. 07/22/13   Charlisa Cham Dara Lords, PA-C  traMADol (ULTRAM) 50 MG tablet Take 1-2 tablets (50-100 mg total) by mouth every 6 (six) hours as needed (mild to moderate pain). 07/22/13   Anacaren Kohan Dara Lords, PA-C    Discharge home with home health  Diet - Cardiac diet and Diabetic diet  Follow up - in 2 weeks  Activity - WBAT  Disposition - Home  Condition Upon Discharge - Good  D/C Meds - See DC Summary  DVT Prophylaxis - Xarelto   Discharge Orders   Future Orders Complete By Expires   Call MD / Call 911  As directed  Comments:     If you experience chest pain or shortness of breath, CALL 911 and be transported to the hospital emergency room.  If you develope a fever above 101 F, pus (white drainage) or increased drainage or redness at the wound, or calf pain, call your surgeon's office.   Change dressing  As directed    Comments:     Change dressing daily with sterile 4 x 4 inch gauze dressing and apply TED hose. Do not submerge the incision under water.   Constipation Prevention  As directed    Comments:     Drink plenty of fluids.  Prune juice may be helpful.  You may use a stool softener, such as  Colace (over the counter) 100 mg twice a day.  Use MiraLax (over the counter) for constipation as needed.   Diet - low sodium heart healthy  As directed    Discharge instructions  As directed    Comments:     Pick up stool softner and laxative for home. Do not submerge incision under water. May shower. Continue to use ice for pain and swelling from surgery.  Take Xarelto for two and a half more weeks, then discontinue Xarelto. Once the patient has completed the Xarelto, they may resume the 325 mg Aspirin and the Plavix 75 mg.   Do not put a pillow under the knee. Place it under the heel.  As directed    Do not sit on low chairs, stoools or toilet seats, as it may be difficult to get up from low surfaces  As directed    Driving restrictions  As directed    Comments:     No driving until released by the physician.   Increase activity slowly as tolerated  As directed    Lifting restrictions  As directed    Comments:     No lifting until released by the physician.   Patient may shower  As directed    Comments:     You may shower without a dressing once there is no drainage.  Do not wash over the wound.  If drainage remains, do not shower until drainage stops.   TED hose  As directed    Comments:     Use stockings (TED hose) for 3 weeks on both leg(s).  You may remove them at night for sleeping.   Weight bearing as tolerated  As directed    Questions:     Laterality:     Extremity:         Medication List    STOP taking these medications       aspirin 325 MG tablet     clopidogrel 75 MG tablet  Commonly known as:  PLAVIX      TAKE these medications       amLODipine-atorvastatin 10-80 MG per tablet  Commonly known as:  CADUET  Take 1 tablet by mouth every morning.     atenolol 100 MG tablet  Commonly known as:  TENORMIN  Take 100 mg by mouth every morning.     INVOKANA 100 MG Tabs  Generic drug:  Canagliflozin  Take 100 mg by mouth every morning.     iron  polysaccharides 150 MG capsule  Commonly known as:  NIFEREX  Take 1 capsule (150 mg total) by mouth daily.     lisinopril 10 MG tablet  Commonly known as:  PRINIVIL,ZESTRIL  Take 10 mg by mouth every morning.     metFORMIN 1000  MG tablet  Commonly known as:  GLUCOPHAGE  Take 1 tablet (1,000 mg total) by mouth 2 (two) times daily with a meal.     methocarbamol 500 MG tablet  Commonly known as:  ROBAXIN  Take 1 tablet (500 mg total) by mouth every 6 (six) hours as needed for muscle spasms.     nitroGLYCERIN 0.4 MG SL tablet  Commonly known as:  NITROSTAT  Place 1 tablet (0.4 mg total) under the tongue every 5 (five) minutes as needed for chest pain.     oxyCODONE 5 MG immediate release tablet  Commonly known as:  Oxy IR/ROXICODONE  Take 1-3 tablets (5-15 mg total) by mouth every 3 (three) hours as needed for moderate pain, severe pain or breakthrough pain.     pantoprazole 40 MG tablet  Commonly known as:  PROTONIX  Take 40 mg by mouth daily.     potassium chloride SA 20 MEQ tablet  Commonly known as:  K-DUR,KLOR-CON  Take 20 mEq by mouth daily.     rivaroxaban 10 MG Tabs tablet  Commonly known as:  XARELTO  - Take 1 tablet (10 mg total) by mouth daily with breakfast. Take Xarelto for two and a half more weeks, then discontinue Xarelto.  - Once the patient has completed the Xarelto, they may resume the 325 mg Aspirin and the Plavix 75 mg.     traMADol 50 MG tablet  Commonly known as:  ULTRAM  Take 1-2 tablets (50-100 mg total) by mouth every 6 (six) hours as needed (mild to moderate pain).           Follow-up Information   Follow up with [facility])    Contact information:   [address] [place] [address] [phone]       Follow up with Gearlean Alf, MD. Schedule an appointment as soon as possible for a visit on 08/04/2013.   Specialty:  Orthopedic Surgery   Contact information:   [address] [place] [address] [phone]       Signed: Mickel Crow 07/22/2013, 2:28 PM

## 2013-07-22 NOTE — Progress Notes (Signed)
**Note James-Identified via Obfuscation** Physical Therapy Treatment Patient Details Name: James James Maxwell James Maxwell MRN: [id number] DOB: [birth year] Today's Date: 07/22/2013 Time: 1001-1030 PT Time Calculation (min): 29 min  PT Assessment / Plan / Recommendation  History of Present Illness L tka   PT Comments   Pt  Reports pain is improved. Will practice steps and plans to DC to home  Follow Up Recommendations  Home health PT     Does the patient have the potential to tolerate intense rehabilitation     Barriers to Discharge        Equipment Recommendations  None recommended by PT    Recommendations for Other Services    Frequency 7X/week   Progress towards PT Goals    Plan Current plan remains appropriate    Precautions / Restrictions Precautions Precautions: Knee Required Braces or Orthoses: Knee Immobilizer - Left Knee Immobilizer - Left: Discontinue once straight leg raise with < 10 degree lag   Pertinent Vitals/Pain Premed. sore    Mobility  Transfers Overall transfer level: Needs assistance Equipment used: Rolling walker (2 wheeled) Transfers: Sit to/from Stand Sit to Stand: Supervision General transfer comment: verbal cues for hand placement Ambulation/Gait Ambulation/Gait assistance: Supervision Ambulation Distance (Feet): 200 Feet Assistive device: Rolling walker (2 wheeled) Gait Pattern/deviations: Step-through pattern General Gait Details: cues for sequence, step length    Exercises     PT Diagnosis:    PT Problem List:   PT Treatment Interventions:     PT Goals (current goals can now be found in the care plan section)    Visit Information  Last PT Received On: 07/22/13 Assistance Needed: +1 History of Present Illness: L tka    Subjective Data      Cognition  Cognition Arousal/Alertness: Awake/alert    Balance     End of Session PT - End of Session Equipment Utilized During Treatment: Left knee immobilizer Activity Tolerance: Patient tolerated treatment well Patient left: in chair;with  call bell/phone within reach Nurse Communication: Mobility status   GP     James James Maxwell 07/22/2013, 12:55 PM

## 2014-03-15 ENCOUNTER — Other Ambulatory Visit: Payer: Self-pay | Admitting: *Deleted

## 2014-05-03 ENCOUNTER — Ambulatory Visit (INDEPENDENT_AMBULATORY_CARE_PROVIDER_SITE_OTHER): Payer: Managed Care, Other (non HMO) | Admitting: Interventional Cardiology

## 2014-05-03 ENCOUNTER — Encounter: Payer: Self-pay | Admitting: Interventional Cardiology

## 2014-05-03 VITALS — BP 140/90 | HR 62 | Ht 69.0 in | Wt 254.8 lb

## 2014-05-03 DIAGNOSIS — I2510 Atherosclerotic heart disease of native coronary artery without angina pectoris: Secondary | ICD-10-CM | POA: Diagnosis not present

## 2014-05-03 DIAGNOSIS — I10 Essential (primary) hypertension: Secondary | ICD-10-CM | POA: Diagnosis not present

## 2014-05-03 DIAGNOSIS — E663 Overweight: Secondary | ICD-10-CM

## 2014-05-03 DIAGNOSIS — R0600 Dyspnea, unspecified: Secondary | ICD-10-CM

## 2014-05-03 DIAGNOSIS — E1151 Type 2 diabetes mellitus with diabetic peripheral angiopathy without gangrene: Secondary | ICD-10-CM | POA: Diagnosis not present

## 2014-05-03 DIAGNOSIS — L602 Onychogryphosis: Secondary | ICD-10-CM | POA: Diagnosis not present

## 2014-05-03 LAB — BASIC METABOLIC PANEL
BUN: 10 mg/dL (ref 6–23)
CALCIUM: 9.2 mg/dL (ref 8.4–10.5)
CO2: 27 mEq/L (ref 19–32)
Chloride: 105 mEq/L (ref 96–112)
Creatinine, Ser: 1 mg/dL (ref 0.4–1.5)
GFR: 96.87 mL/min (ref >60.00)
Glucose, Bld: 108 mg/dL — ABNORMAL HIGH (ref 70–99)
Potassium: 4.1 mEq/L (ref 3.5–5.1)
SODIUM: 139 meq/L (ref 135–145)

## 2014-05-03 LAB — BRAIN NATRIURETIC PEPTIDE: PRO B NATRI PEPTIDE: 40 pg/mL (ref 0.0–100.0)

## 2014-05-03 NOTE — Progress Notes (Signed)
Patient ID: James Maxwell, male   DOB: [birth year], 63 y.o.   MRN: [id number]    James Maxwell, James Maxwell, James Maxwell  James Maxwell Phone: [phone] Fax:  [phone]  Date:  05/03/2014   ID:  James, Maxwell 18-Jun-1950, MRN [id number]  PCP:  James Greenland, MD      History of Present Illness: James Maxwell is a 63 y.o. male who has CAD.  He had chest pain prior to RCA stent.  None since. He no longer walks a mile daily due to knee problems. He had a TKR on the left in 3/15. He had lost 20 lbs. He has taken off a little more weight.  He had his right knee replaced earlier in 2014.  No cardiac complications at that time.  The knee is not as painfree as he would have liked. Still with  limited ability to exercise. CAD/ASCVD:  c/o Leg edema right knee swelling.  Denies : Chest pain.  Diaphoresis.  Dizziness.   Fatigue.  Nitroglycerin.  Orthopnea.  Palpitations.  Paroxysmal nocturnal dyspnea.    Reports 2 episodes of DOE, with walking up the stairs carrying a box.  Not like what he had before his stent.   Has happened 3 x in the past 2 weeks.  He is concerned.     Wt Readings from Last 3 Encounters:  05/03/14 254 lb 12.8 oz (115.577 kg)  07/20/13 259 lb (117.482 kg)  07/15/13 259 lb (117.482 kg)     Past Medical History  Diagnosis Date  . Hypertension   . Diabetes mellitus without complication   . Arthritis   . 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 CHF (congestive heart failure) 2004  . Coronary artery disease   . Stented coronary artery   . History of gout   . GERD (gastroesophageal reflux disease)     Current Outpatient Prescriptions  Medication Sig Dispense Refill  . amLODipine-atorvastatin (CADUET) 10-80 MG per tablet Take 1 tablet by mouth every morning.     [name] atenolol (TENORMIN) 100 MG tablet Take 100 mg by mouth every morning.     . Canagliflozin (INVOKANA) 100 MG TABS Take 100 mg by mouth every morning.     [name] CIALIS 20 MG tablet as needed.  0  . FLUARIX QUADRIVALENT 0.5  ML injection   0  . iron polysaccharides (NIFEREX) 150 MG capsule Take 1 capsule (150 mg total) by mouth daily. 21 capsule 0  . lisinopril (PRINIVIL,ZESTRIL) 10 MG tablet Take 10 mg by mouth every morning.     . metFORMIN (GLUCOPHAGE) 1000 MG tablet Take 1 tablet (1,000 mg total) by mouth 2 (two) times daily with a meal. 180 tablet 3  . methocarbamol (ROBAXIN) 500 MG tablet Take 1 tablet (500 mg total) by mouth every 6 (six) hours as needed for muscle spasms. 80 tablet 0  . nitroGLYCERIN (NITROSTAT) 0.4 MG SL tablet Place 1 tablet (0.4 mg total) under the tongue every 5 (five) minutes as needed for chest pain. 25 tablet 5  . ONETOUCH DELICA LANCETS 01U MISC   0  . ONETOUCH VERIO test strip   0  . oxyCODONE (OXY IR/ROXICODONE) 5 MG immediate release tablet Take 1-3 tablets (5-15 mg total) by mouth every 3 (three) hours as needed for moderate pain, severe pain or breakthrough pain. 80 tablet 0  . pantoprazole (PROTONIX) 40 MG tablet Take 40 mg by mouth daily.    . potassium chloride SA (K-DUR,KLOR-CON) 20 MEQ tablet  Take 20 mEq by mouth daily.    . rivaroxaban (XARELTO) 10 MG TABS tablet Take 1 tablet (10 mg total) by mouth daily with breakfast. Take Xarelto for two and a half more weeks, then discontinue Xarelto. Once the patient has completed the Xarelto, they may resume the 325 mg Aspirin and the Plavix 75 mg. 19 tablet 0  . Vitamin D, Ergocalciferol, (DRISDOL) 50000 UNITS CAPS capsule Take 50,000 Units by mouth daily.  0   No current facility-administered medications for this visit.    Allergies:   No Known Allergies  Social History:  The patient  reports that he quit smoking about 18 years ago. He does not have any smokeless tobacco history on file. He reports that he does not drink alcohol or use illicit drugs.   Family History:  The patient's family history includes Cancer in his brother and brother; Diabetes in his mother; Heart attack in his brother, mother, and son; Hypertension in his  mother, sister, and sister.   ROS:  Please see the history of present illness.  No nausea, vomiting.  No fevers, chills.  No focal weakness.  No dysuria. Knee pain bilaterally.  All other systems reviewed and negative.   PHYSICAL EXAM: VS:  BP 140/90 mmHg  Pulse 62  Ht 5\' 9"  (1.753 m)  Wt 254 lb 12.8 oz (115.577 kg)  BMI 37.61 kg/m2 Well nourished, well developed, in no acute distress HEENT: normal Neck: no JVD, no carotid bruits Cardiac:  normal S1, S2; RRR;  Lungs:  clear to auscultation bilaterally, no wheezing, rhonchi or rales Abd: soft, nontender, no hepatomegaly Ext: no edema Skin: warm and dry Neuro:   no focal abnormalities noted Psych: normal affect  EKG:  Normal     ASSESSMENT AND PLAN:  CAD  No ischemia by 06/2010 cardiolite. He walks up stairs without cardiac sx. No further cardiac testing needed before surgery. ECG from May 19, 2012 reviewed. Normal sinus rhythm with no significant ST segment changes.  Normal ECG today.  Okay to hold Plavix and aspirin 5 days prior to surgery.    Continue Amlodipine-Atorvastatin Tablet, 10-80 MG, 1 tablet, Orally, once a day for blood pressure and cholesterol Continue Atenolol Tablet, 100 MG, 1 tablet, once a day for blood pressure No angina. Had large stent placed in the RCA in 2006. No sx like he had prior to his stent. LDL 36 at last check.  Decreased ASA to 81 mg daily.   Obesity  He has maintained weight loss, and lost a little more. Continue regular exercise and diet control to continue with weight loss. Hopefully, this will improve after the knee replacement.   Hypertension, essential  Continue Lisinopril Tablet, 10 MG, 3 tablet, daily for blood pressure WOuld like to see readings outside of the doctor's office. If high, could increase lisinopril. Reasonable today. Should get better with weight loss.  SHOB: Check echo and BNP.  Look for fluid overload. Unfortunately, recovery from knee surgery has not been as quick as he  would like. Some of his shortness of breath may be from deconditioning since he has not been able to be as active as he would like.  Signed, Mina Marble, MD, [facility] 05/03/2014 12:39 PM

## 2014-05-03 NOTE — Patient Instructions (Signed)
Your physician recommends that you continue on your current medications as directed. Please refer to the Current Medication list given to you today.  Your physician recommends that you have lab work today BMET and BNP   Your physician has requested that you have an echocardiogram. Echocardiography is a painless test that uses sound waves to create images of your heart. It provides your doctor with information about the size and shape of your heart and how well your heart's chambers and valves are working. This procedure takes approximately one hour. There are no restrictions for this procedure.  Your physician wants you to follow-up in: 1 year with Dr. Irish Lack. You will receive a reminder letter in the mail two months in advance. If you don't receive a letter, please call our office to schedule the follow-up appointment.

## 2014-05-04 ENCOUNTER — Ambulatory Visit (HOSPITAL_COMMUNITY): Payer: Managed Care, Other (non HMO) | Attending: Interventional Cardiology | Admitting: Cardiology

## 2014-05-04 DIAGNOSIS — R0600 Dyspnea, unspecified: Secondary | ICD-10-CM | POA: Diagnosis not present

## 2014-05-04 NOTE — Progress Notes (Signed)
Echo performed. 

## 2014-05-06 ENCOUNTER — Telehealth: Payer: Self-pay | Admitting: Interventional Cardiology

## 2014-05-06 NOTE — Telephone Encounter (Signed)
I spoke with the patient. 

## 2014-05-06 NOTE — Telephone Encounter (Signed)
New message ° ° ° ° ° °Want echo results °

## 2014-06-15 DIAGNOSIS — G8929 Other chronic pain: Secondary | ICD-10-CM | POA: Diagnosis not present

## 2014-06-15 DIAGNOSIS — M5412 Radiculopathy, cervical region: Secondary | ICD-10-CM | POA: Diagnosis not present

## 2014-06-15 DIAGNOSIS — M5440 Lumbago with sciatica, unspecified side: Secondary | ICD-10-CM | POA: Diagnosis not present

## 2014-06-25 DIAGNOSIS — Z471 Aftercare following joint replacement surgery: Secondary | ICD-10-CM | POA: Diagnosis not present

## 2014-06-25 DIAGNOSIS — Z96652 Presence of left artificial knee joint: Secondary | ICD-10-CM | POA: Diagnosis not present

## 2014-07-07 DIAGNOSIS — L84 Corns and callosities: Secondary | ICD-10-CM | POA: Diagnosis not present

## 2014-07-07 DIAGNOSIS — E1151 Type 2 diabetes mellitus with diabetic peripheral angiopathy without gangrene: Secondary | ICD-10-CM | POA: Diagnosis not present

## 2014-07-07 DIAGNOSIS — L602 Onychogryphosis: Secondary | ICD-10-CM | POA: Diagnosis not present

## 2014-07-20 DIAGNOSIS — M25562 Pain in left knee: Secondary | ICD-10-CM | POA: Diagnosis not present

## 2014-07-20 DIAGNOSIS — M5440 Lumbago with sciatica, unspecified side: Secondary | ICD-10-CM | POA: Diagnosis not present

## 2014-07-20 DIAGNOSIS — G8929 Other chronic pain: Secondary | ICD-10-CM | POA: Diagnosis not present

## 2014-07-20 DIAGNOSIS — M25561 Pain in right knee: Secondary | ICD-10-CM | POA: Diagnosis not present

## 2014-07-20 DIAGNOSIS — M5412 Radiculopathy, cervical region: Secondary | ICD-10-CM | POA: Diagnosis not present

## 2014-08-10 DIAGNOSIS — E559 Vitamin D deficiency, unspecified: Secondary | ICD-10-CM | POA: Diagnosis not present

## 2014-08-10 DIAGNOSIS — E1165 Type 2 diabetes mellitus with hyperglycemia: Secondary | ICD-10-CM | POA: Diagnosis not present

## 2014-08-10 DIAGNOSIS — R351 Nocturia: Secondary | ICD-10-CM | POA: Diagnosis not present

## 2014-08-17 DIAGNOSIS — G541 Lumbosacral plexus disorders: Secondary | ICD-10-CM | POA: Diagnosis not present

## 2014-08-17 DIAGNOSIS — G894 Chronic pain syndrome: Secondary | ICD-10-CM | POA: Diagnosis not present

## 2014-08-17 DIAGNOSIS — M546 Pain in thoracic spine: Secondary | ICD-10-CM | POA: Diagnosis not present

## 2014-08-17 DIAGNOSIS — G603 Idiopathic progressive neuropathy: Secondary | ICD-10-CM | POA: Diagnosis not present

## 2014-08-17 DIAGNOSIS — M5412 Radiculopathy, cervical region: Secondary | ICD-10-CM | POA: Diagnosis not present

## 2014-08-17 DIAGNOSIS — M545 Low back pain: Secondary | ICD-10-CM | POA: Diagnosis not present

## 2014-09-08 DIAGNOSIS — E1151 Type 2 diabetes mellitus with diabetic peripheral angiopathy without gangrene: Secondary | ICD-10-CM | POA: Diagnosis not present

## 2014-09-08 DIAGNOSIS — L602 Onychogryphosis: Secondary | ICD-10-CM | POA: Diagnosis not present

## 2014-09-08 DIAGNOSIS — L84 Corns and callosities: Secondary | ICD-10-CM | POA: Diagnosis not present

## 2014-09-23 ENCOUNTER — Telehealth: Payer: Self-pay | Admitting: Interventional Cardiology

## 2014-09-23 NOTE — Telephone Encounter (Signed)
New Message  Coloscopy for  Request for surgical clearance:  1. What type of surgery is being performed? colonoscopy  2. When is this surgery scheduled? 5/10   3. Are there any medications that need to be held prior to surgery and how long? Plavix  4. Name of physician performing surgery? Hung  5. What is your office phone and fax number? [phone].

## 2014-09-23 NOTE — Telephone Encounter (Signed)
Per Dr. Irish Lack note in December 2015- "Okay to hold Plavix and aspirin 5 days prior to surgery. "   Will fax to [facility] with Dr. Ulyses Amor office.

## 2014-09-28 ENCOUNTER — Telehealth: Payer: Self-pay | Admitting: *Deleted

## 2014-09-28 DIAGNOSIS — K635 Polyp of colon: Secondary | ICD-10-CM | POA: Diagnosis not present

## 2014-09-28 DIAGNOSIS — K5730 Diverticulosis of large intestine without perforation or abscess without bleeding: Secondary | ICD-10-CM | POA: Diagnosis not present

## 2014-09-28 DIAGNOSIS — Z86010 Personal history of colonic polyps: Secondary | ICD-10-CM | POA: Diagnosis not present

## 2014-09-28 DIAGNOSIS — D123 Benign neoplasm of transverse colon: Secondary | ICD-10-CM | POA: Diagnosis not present

## 2014-09-28 DIAGNOSIS — D124 Benign neoplasm of descending colon: Secondary | ICD-10-CM | POA: Diagnosis not present

## 2014-09-28 NOTE — Telephone Encounter (Signed)
Kayla from [facility] calling stating that pt is currently in admission for colonoscopy. Lonn Georgia states that they received the clearance for pt to hold his plavix but they need cardiac clearance before proceeding with anesthesia. Informed Lonn Georgia that I would route this information to Dr. Irish Lack for review.

## 2014-09-28 NOTE — Telephone Encounter (Signed)
Spoke with Lonn Georgia at [facility] and informed her that I would fax information over to her. She verbalized understanding and was in agreement with this plan.

## 2014-09-28 NOTE — Telephone Encounter (Signed)
No further cardiac testing needed before colonoscopy.  

## 2014-10-08 IMAGING — CR DG CHEST 2V
2 series · 2 of 2 positions shown · non-contrast
Comparison: 07/30/2005

CLINICAL DATA: Preop for left knee replacement

EXAM:
CHEST  2 VIEW

[w chest pa]
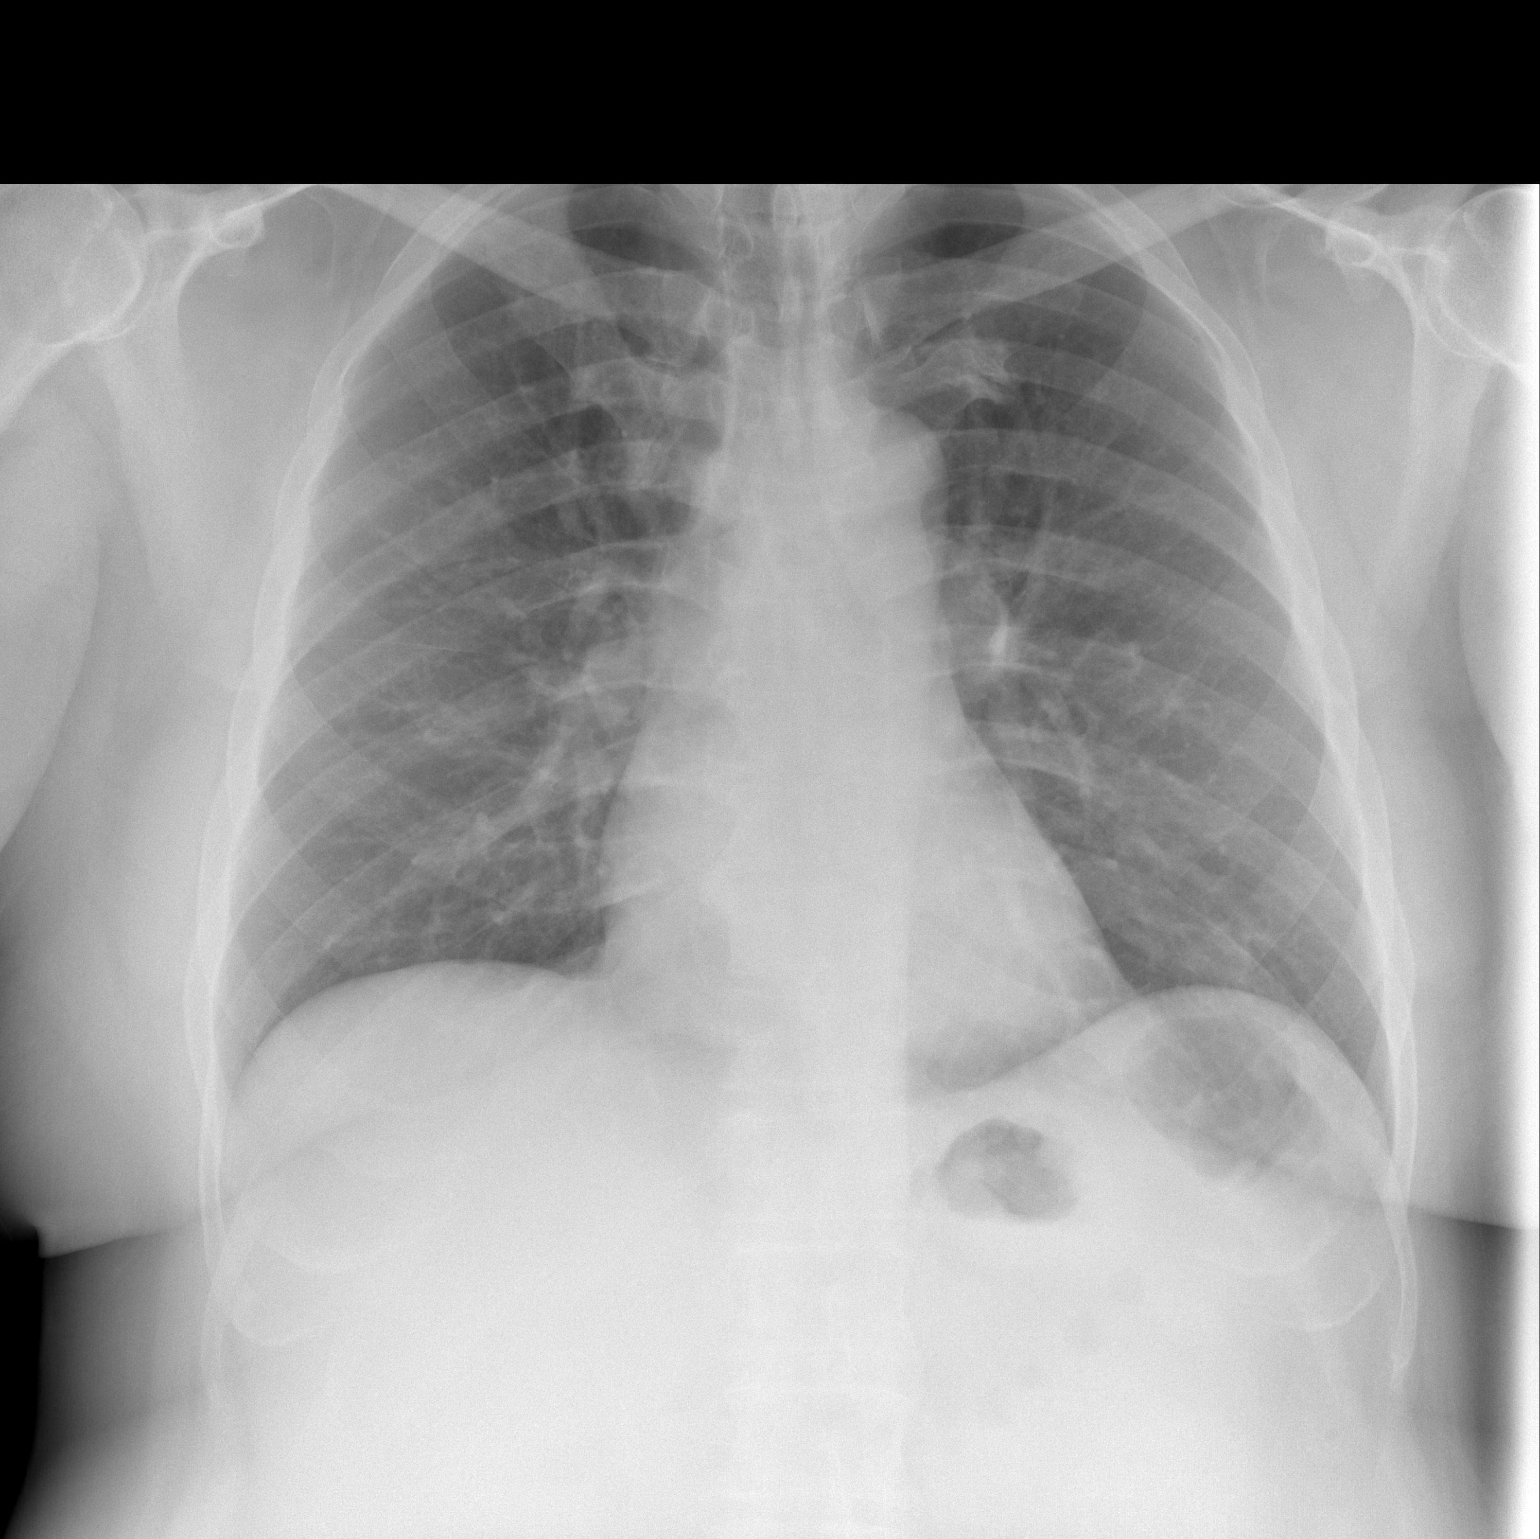

[w chest lat]
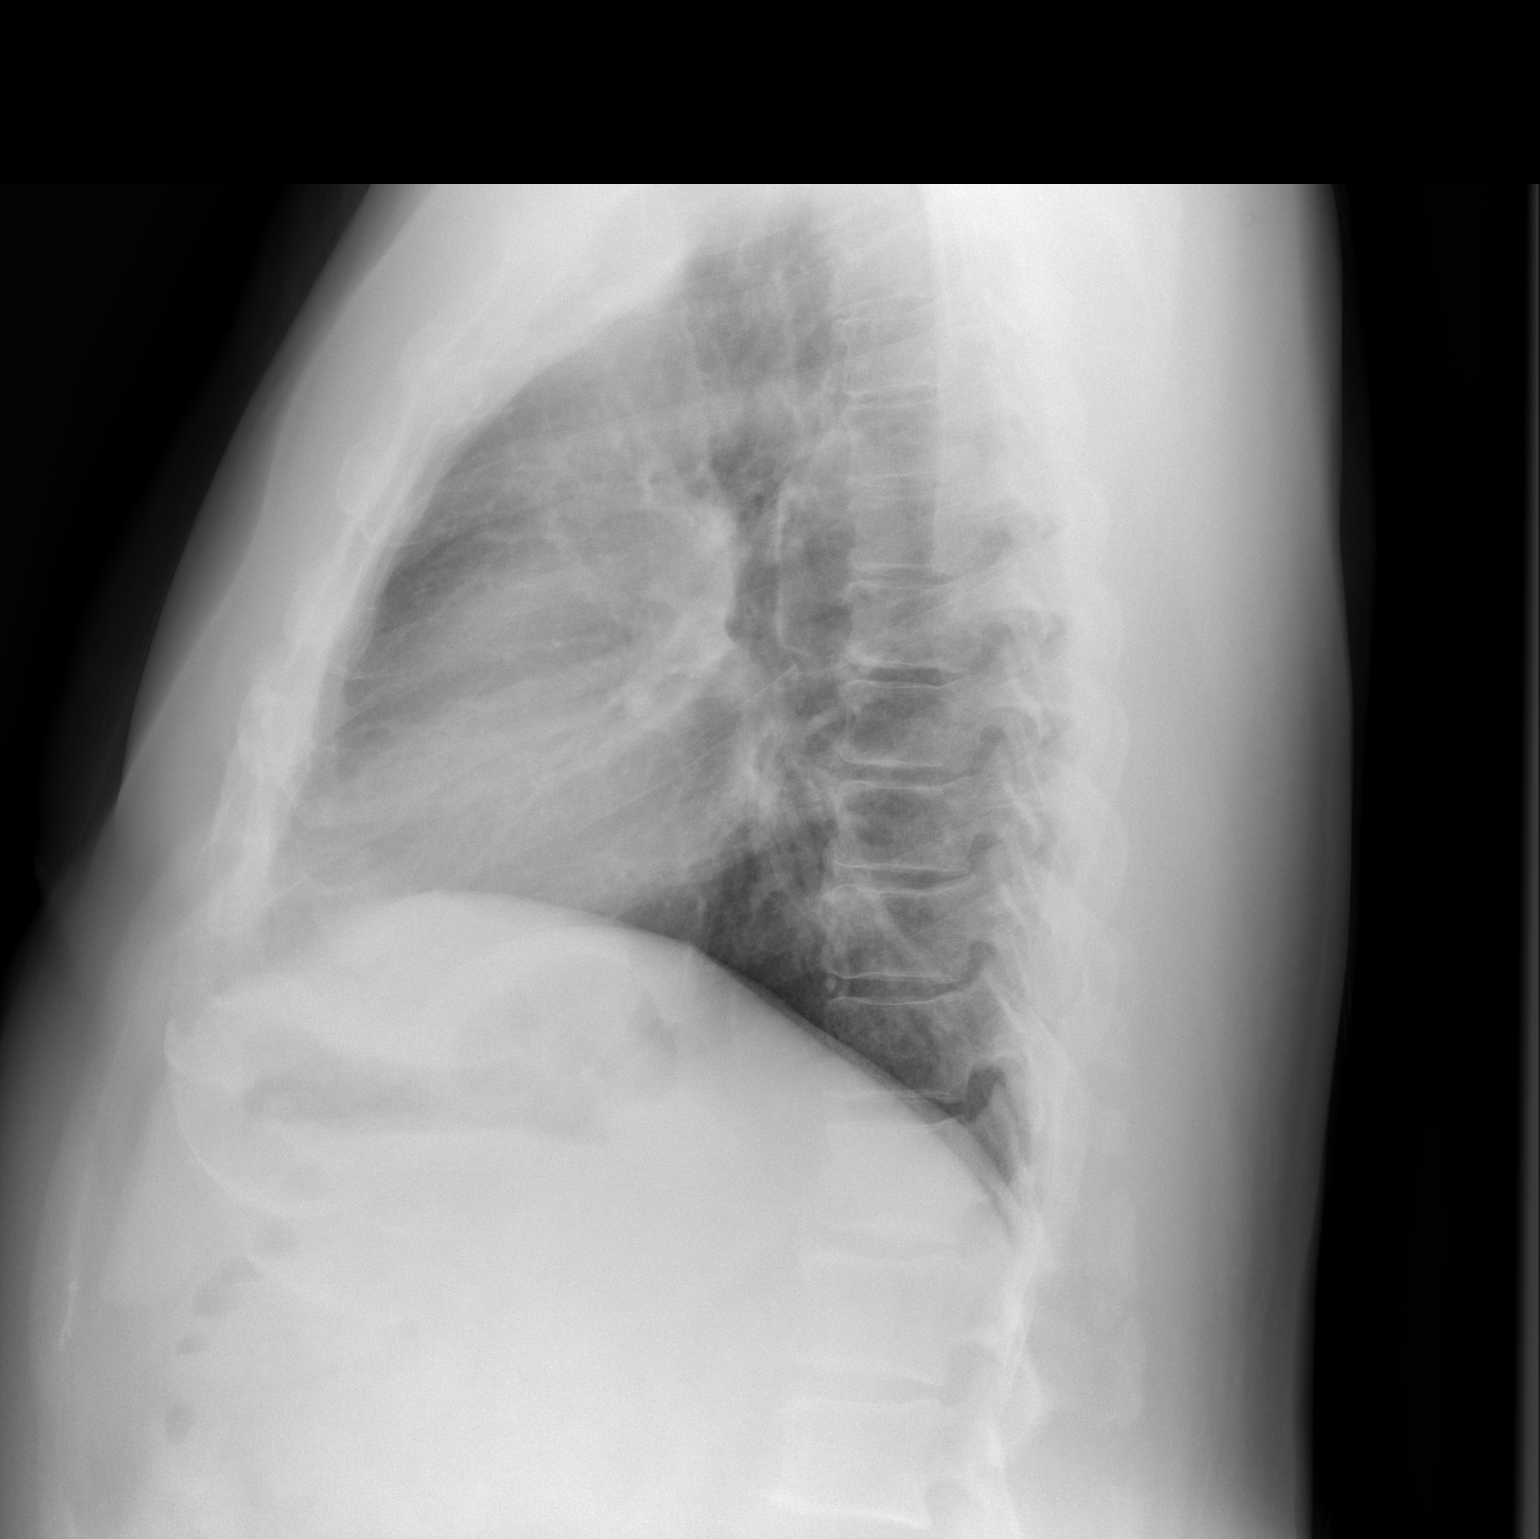

[2 of 2 positions shown; findings below may reference images not displayed]

FINDINGS: Cardiomediastinal silhouette is stable. No acute infiltrate or
pleural effusion. No pulmonary edema. Mild degenerative changes
thoracic spine.
IMPRESSION: No active cardiopulmonary disease.

## 2014-11-12 DIAGNOSIS — E1151 Type 2 diabetes mellitus with diabetic peripheral angiopathy without gangrene: Secondary | ICD-10-CM | POA: Diagnosis not present

## 2014-11-12 DIAGNOSIS — L602 Onychogryphosis: Secondary | ICD-10-CM | POA: Diagnosis not present

## 2014-12-02 DIAGNOSIS — R1032 Left lower quadrant pain: Secondary | ICD-10-CM | POA: Diagnosis not present

## 2014-12-02 DIAGNOSIS — E1165 Type 2 diabetes mellitus with hyperglycemia: Secondary | ICD-10-CM | POA: Diagnosis not present

## 2014-12-02 DIAGNOSIS — N521 Erectile dysfunction due to diseases classified elsewhere: Secondary | ICD-10-CM | POA: Diagnosis not present

## 2014-12-02 DIAGNOSIS — I119 Hypertensive heart disease without heart failure: Secondary | ICD-10-CM | POA: Diagnosis not present

## 2014-12-09 DIAGNOSIS — I2510 Atherosclerotic heart disease of native coronary artery without angina pectoris: Secondary | ICD-10-CM | POA: Diagnosis not present

## 2014-12-09 DIAGNOSIS — I119 Hypertensive heart disease without heart failure: Secondary | ICD-10-CM | POA: Diagnosis not present

## 2014-12-09 DIAGNOSIS — L255 Unspecified contact dermatitis due to plants, except food: Secondary | ICD-10-CM | POA: Diagnosis not present

## 2014-12-29 DIAGNOSIS — Z23 Encounter for immunization: Secondary | ICD-10-CM | POA: Diagnosis not present

## 2015-01-19 DIAGNOSIS — E1151 Type 2 diabetes mellitus with diabetic peripheral angiopathy without gangrene: Secondary | ICD-10-CM | POA: Diagnosis not present

## 2015-01-19 DIAGNOSIS — L602 Onychogryphosis: Secondary | ICD-10-CM | POA: Diagnosis not present

## 2015-02-11 ENCOUNTER — Encounter: Payer: Self-pay | Admitting: Interventional Cardiology

## 2015-03-06 ENCOUNTER — Emergency Department (HOSPITAL_COMMUNITY)
Admission: EM | Admit: 2015-03-06 | Discharge: 2015-03-06 | Disposition: A | Discharge status: Home / Self Care | Payer: Managed Care, Other (non HMO) | Attending: Emergency Medicine | Admitting: Emergency Medicine

## 2015-03-06 ENCOUNTER — Encounter (HOSPITAL_COMMUNITY): Payer: Self-pay | Admitting: Emergency Medicine

## 2015-03-06 DIAGNOSIS — R8299 Other abnormal findings in urine: Secondary | ICD-10-CM | POA: Diagnosis not present

## 2015-03-06 DIAGNOSIS — I2510 Atherosclerotic heart disease of native coronary artery without angina pectoris: Secondary | ICD-10-CM | POA: Diagnosis not present

## 2015-03-06 DIAGNOSIS — I10 Essential (primary) hypertension: Secondary | ICD-10-CM | POA: Diagnosis not present

## 2015-03-06 DIAGNOSIS — Z79899 Other long term (current) drug therapy: Secondary | ICD-10-CM | POA: Insufficient documentation

## 2015-03-06 DIAGNOSIS — Z8739 Personal history of other diseases of the musculoskeletal system and connective tissue: Secondary | ICD-10-CM | POA: Diagnosis not present

## 2015-03-06 DIAGNOSIS — I509 Heart failure, unspecified: Secondary | ICD-10-CM | POA: Insufficient documentation

## 2015-03-06 DIAGNOSIS — R82998 Other abnormal findings in urine: Secondary | ICD-10-CM

## 2015-03-06 DIAGNOSIS — Z87891 Personal history of nicotine dependence: Secondary | ICD-10-CM | POA: Insufficient documentation

## 2015-03-06 DIAGNOSIS — R319 Hematuria, unspecified: Secondary | ICD-10-CM | POA: Insufficient documentation

## 2015-03-06 DIAGNOSIS — R739 Hyperglycemia, unspecified: Secondary | ICD-10-CM

## 2015-03-06 DIAGNOSIS — E1165 Type 2 diabetes mellitus with hyperglycemia: Secondary | ICD-10-CM | POA: Insufficient documentation

## 2015-03-06 DIAGNOSIS — Z9861 Coronary angioplasty status: Secondary | ICD-10-CM | POA: Diagnosis not present

## 2015-03-06 DIAGNOSIS — I252 Old myocardial infarction: Secondary | ICD-10-CM | POA: Diagnosis not present

## 2015-03-06 DIAGNOSIS — Z8719 Personal history of other diseases of the digestive system: Secondary | ICD-10-CM | POA: Insufficient documentation

## 2015-03-06 LAB — BASIC METABOLIC PANEL
ANION GAP: 12 (ref 5–15)
BUN: 16 mg/dL (ref 6–20)
CALCIUM: 8.8 mg/dL — AB (ref 8.9–10.3)
CO2: 18 mmol/L — AB (ref 22–32)
Chloride: 103 mmol/L (ref 101–111)
Creatinine, Ser: 1.22 mg/dL (ref 0.61–1.24)
GFR calc Af Amer: >60 mL/min (ref >60)
GLUCOSE: 398 mg/dL — AB (ref 65–99)
Potassium: 4.1 mmol/L (ref 3.5–5.1)
Sodium: 133 mmol/L — ABNORMAL LOW (ref 135–145)

## 2015-03-06 LAB — CBG MONITORING, ED
GLUCOSE-CAPILLARY: 182 mg/dL — AB (ref 65–99)
Glucose-Capillary: 385 mg/dL — ABNORMAL HIGH (ref 65–99)

## 2015-03-06 LAB — URINE MICROSCOPIC-ADD ON

## 2015-03-06 LAB — URINALYSIS, ROUTINE W REFLEX MICROSCOPIC
BILIRUBIN URINE: NEGATIVE
Ketones, ur: 40 mg/dL — AB
Leukocytes, UA: NEGATIVE
Nitrite: NEGATIVE
PROTEIN: NEGATIVE mg/dL
Specific Gravity, Urine: 1.038 — ABNORMAL HIGH (ref 1.005–1.030)
UROBILINOGEN UA: 0.2 mg/dL (ref 0.0–1.0)
pH: 5 (ref 5.0–8.0)

## 2015-03-06 LAB — CBC
HCT: 44.4 % (ref 39.0–52.0)
Hemoglobin: 15.6 g/dL (ref 13.0–17.0)
MCH: 29.1 pg (ref 26.0–34.0)
MCHC: 35.1 g/dL (ref 30.0–36.0)
MCV: 82.7 fL (ref 78.0–100.0)
Platelets: 245 10*3/uL (ref 150–400)
RBC: 5.37 MIL/uL (ref 4.22–5.81)
RDW: 12.8 % (ref 11.5–15.5)
WBC: 7.9 10*3/uL (ref 4.0–10.5)

## 2015-03-06 MED ORDER — CEPHALEXIN 500 MG PO CAPS: 500.0000 mg | ORAL_CAPSULE | Freq: Four times a day (QID) | ORAL | Status: DC

## 2015-03-06 MED ORDER — SODIUM CHLORIDE 0.9 % IV BOLUS (SEPSIS)
1000.0000 mL | Freq: Once | INTRAVENOUS | Status: AC
  Administered 2015-03-06: 1000 mL via INTRAVENOUS

## 2015-03-06 MED ORDER — INSULIN ASPART 100 UNIT/ML ~~LOC~~ SOLN
8.0000 [IU] | Freq: Once | SUBCUTANEOUS | Status: AC
  Administered 2015-03-06: 8 [IU] via SUBCUTANEOUS
  Filled 2015-03-06: qty 1

## 2015-03-06 MED ORDER — CEPHALEXIN 500 MG PO CAPS
500.0000 mg | ORAL_CAPSULE | Freq: Once | ORAL | Status: AC
  Administered 2015-03-06: 500 mg via ORAL
  Filled 2015-03-06: qty 1

## 2015-03-06 NOTE — Discharge Instructions (Signed)
It was our pleasure to provide your ER care today - we hope that you feel better.  Your blood sugar is high - continue diabetic medication, drink plenty of water, follow diabetic diet/foods, and follow up with your doctor tomorrow as planned - discuss additional diabetic medication/treatment then.   From today's lab work, there are white and red blood cells in urine that can indicate a urine infection - take antibiotic (keflex) as prescribed, and follow up with your doctor for recheck of urine in 1-2 weeks (if urine abnormality persists, your doctor or urologist may need to do further evaluation then).  Return to ER if worse, new symptoms, fevers, vomiting, medical emergency, other concern.    Hyperglycemia Hyperglycemia occurs when the glucose (sugar) in your blood is too high. Hyperglycemia can happen for many reasons, but it most often happens to people who do not know they have diabetes or are not managing their diabetes properly.  CAUSES  Whether you have diabetes or not, there are other causes of hyperglycemia. Hyperglycemia can occur when you have diabetes, but it can also occur in other situations that you might not be as aware of, such as: Diabetes  If you have diabetes and are having problems controlling your blood glucose, hyperglycemia could occur because of some of the following reasons:  Not following your meal plan.  Not taking your diabetes medications or not taking it properly.  Exercising less or doing less activity than you normally do.  Being sick. Pre-diabetes  This cannot be ignored. Before people develop Type 2 diabetes, they almost always have "pre-diabetes." This is when your blood glucose levels are higher than normal, but not yet high enough to be diagnosed as diabetes. Research has shown that some long-term damage to the body, especially the heart and circulatory system, may already be occurring during pre-diabetes. If you take action to manage your blood  glucose when you have pre-diabetes, you may delay or prevent Type 2 diabetes from developing. Stress  If you have diabetes, you may be "diet" controlled or on oral medications or insulin to control your diabetes. However, you may find that your blood glucose is higher than usual in the hospital whether you have diabetes or not. This is often referred to as "stress hyperglycemia." Stress can elevate your blood glucose. This happens because of hormones put out by the body during times of stress. If stress has been the cause of your high blood glucose, it can be followed regularly by your caregiver. That way he/she can make sure your hyperglycemia does not continue to get worse or progress to diabetes. Steroids  Steroids are medications that act on the infection fighting system (immune system) to block inflammation or infection. One side effect can be a rise in blood glucose. Most people can produce enough extra insulin to allow for this rise, but for those who cannot, steroids make blood glucose levels go even higher. It is not unusual for steroid treatments to "uncover" diabetes that is developing. It is not always possible to determine if the hyperglycemia will go away after the steroids are stopped. A special blood test called an A1c is sometimes done to determine if your blood glucose was elevated before the steroids were started. SYMPTOMS  Thirsty.  Frequent urination.  Dry mouth.  Blurred vision.  Tired or fatigue.  Weakness.  Sleepy.  Tingling in feet or leg. DIAGNOSIS  Diagnosis is made by monitoring blood glucose in one or all of the following ways:  A1c  test. This is a chemical found in your blood.  Fingerstick blood glucose monitoring.  Laboratory results. TREATMENT  First, knowing the cause of the hyperglycemia is important before the hyperglycemia can be treated. Treatment may include, but is not be limited to:  Education.  Change or adjustment in  medications.  Change or adjustment in meal plan.  Treatment for an illness, infection, etc.  More frequent blood glucose monitoring.  Change in exercise plan.  Decreasing or stopping steroids.  Lifestyle changes. HOME CARE INSTRUCTIONS   Test your blood glucose as directed.  Exercise regularly. Your caregiver will give you instructions about exercise. Pre-diabetes or diabetes which comes on with stress is helped by exercising.  Eat wholesome, balanced meals. Eat often and at regular, fixed times. Your caregiver or nutritionist will give you a meal plan to guide your sugar intake.  Being at an ideal weight is important. If needed, losing as little as 10 to 15 pounds may help improve blood glucose levels. SEEK MEDICAL CARE IF:   You have questions about medicine, activity, or diet.  You continue to have symptoms (problems such as increased thirst, urination, or weight gain). SEEK IMMEDIATE MEDICAL CARE IF:   You are vomiting or have diarrhea.  Your breath smells fruity.  You are breathing faster or slower.  You are very sleepy or incoherent.  You have numbness, tingling, or pain in your feet or hands.  You have chest pain.  Your symptoms get worse even though you have been following your caregiver's orders.  If you have any other questions or concerns.   This information is not intended to replace advice given to you by your health care provider. Make sure you discuss any questions you have with your health care provider.   Document Released: 10/31/2000 Document Revised: 07/30/2011 Document Reviewed: 01/11/2015 Elsevier Interactive Patient Education 2016 [employer]  Type 2 Diabetes Mellitus, Adult Type 2 diabetes mellitus, often simply referred to as type 2 diabetes, is a long-lasting (chronic) disease. In type 2 diabetes, the pancreas does not make enough insulin (a hormone), the cells are less responsive to the insulin that is made (insulin resistance), or both.  Normally, insulin moves sugars from food into the tissue cells. The tissue cells use the sugars for energy. The lack of insulin or the lack of normal response to insulin causes excess sugars to build up in the blood instead of going into the tissue cells. As a result, high blood sugar (hyperglycemia) develops. The effect of high sugar (glucose) levels can cause many complications. Type 2 diabetes was also previously called adult-onset diabetes, but it can occur at any age.  RISK FACTORS  A person is predisposed to developing type 2 diabetes if someone in the family has the disease and also has one or more of the following primary risk factors:  Weight gain, or being overweight or obese.  An inactive lifestyle.  A history of consistently eating high-calorie foods. Maintaining a normal weight and regular physical activity can reduce the chance of developing type 2 diabetes. SYMPTOMS  A person with type 2 diabetes may not show symptoms initially. The symptoms of type 2 diabetes appear slowly. The symptoms include:  Increased thirst (polydipsia).  Increased urination (polyuria).  Increased urination during the night (nocturia).  Sudden or unexplained weight changes.  Frequent, recurring infections.  Tiredness (fatigue).  Weakness.  Vision changes, such as blurred vision.  Fruity smell to your breath.  Abdominal pain.  Nausea or vomiting.  Cuts or  bruises which are slow to heal.  Tingling or numbness in the hands or feet.  An open skin wound (ulcer). DIAGNOSIS Type 2 diabetes is frequently not diagnosed until complications of diabetes are present. Type 2 diabetes is diagnosed when symptoms or complications are present and when blood glucose levels are increased. Your blood glucose level may be checked by one or more of the following blood tests:  A fasting blood glucose test. You will not be allowed to eat for at least 8 hours before a blood sample is taken.  A random  blood glucose test. Your blood glucose is checked at any time of the day regardless of when you ate.  A hemoglobin A1c blood glucose test. A hemoglobin A1c test provides information about blood glucose control over the previous 3 months.  An oral glucose tolerance test (OGTT). Your blood glucose is measured after you have not eaten (fasted) for 2 hours and then after you drink a glucose-containing beverage. TREATMENT   You may need to take insulin or diabetes medicine daily to keep blood glucose levels in the desired range.  If you use insulin, you may need to adjust the dosage depending on the carbohydrates that you eat with each meal or snack.  Lifestyle changes are recommended as part of your treatment. These may include:  Following an individualized diet plan developed by a nutritionist or dietitian.  Exercising daily. Your health care providers will set individualized treatment goals for you based on your age, your medicines, how long you have had diabetes, and any other medical conditions you have. Generally, the goal of treatment is to maintain the following blood glucose levels:  Before meals (preprandial): 80-130 mg/dL.  After meals (postprandial): below 180 mg/dL.  A1c: less than 6.5-7%. HOME CARE INSTRUCTIONS   Have your hemoglobin A1c level checked twice a year.  Perform daily blood glucose monitoring as directed by your health care provider.  Monitor urine ketones when you are ill and as directed by your health care provider.  Take your diabetes medicine or insulin as directed by your health care provider to maintain your blood glucose levels in the desired range.  Never run out of diabetes medicine or insulin. It is needed every day.  If you are using insulin, you may need to adjust the amount of insulin given based on your intake of carbohydrates. Carbohydrates can raise blood glucose levels but need to be included in your diet. Carbohydrates provide vitamins,  minerals, and fiber which are an essential part of a healthy diet. Carbohydrates are found in fruits, vegetables, whole grains, dairy products, legumes, and foods containing added sugars.  Eat healthy foods. You should make an appointment to see a registered dietitian to help you create an eating plan that is right for you.  Lose weight if you are overweight.  Carry a medical alert card or wear your medical alert jewelry.  Carry a 15-gram carbohydrate snack with you at all times to treat low blood glucose (hypoglycemia). Some examples of 15-gram carbohydrate snacks include:  Glucose tablets, 3 or 4.  Glucose gel, 15-gram tube.  Raisins, 2 tablespoons (24 grams).  Jelly beans, 6.  Animal crackers, 8.  Regular pop, 4 ounces (120 mL).  Gummy treats, 9.  Recognize hypoglycemia. Hypoglycemia occurs with blood glucose levels of 70 mg/dL and below. The risk for hypoglycemia increases when fasting or skipping meals, during or after intense exercise, and during sleep. Hypoglycemia symptoms can include:  Tremors or shakes.  Decreased ability to  concentrate.  Sweating.  Increased heart rate.  Headache.  Dry mouth.  Hunger.  Irritability.  Anxiety.  Restless sleep.  Altered speech or coordination.  Confusion.  Treat hypoglycemia promptly. If you are alert and able to safely swallow, follow the 15:15 rule:  Take 15-20 grams of rapid-acting glucose or carbohydrate. Rapid-acting options include glucose gel, glucose tablets, or 4 ounces (120 mL) of fruit juice, regular soda, or low-fat milk.  Check your blood glucose level 15 minutes after taking the glucose.  Take 15-20 grams more of glucose if the repeat blood glucose level is still 70 mg/dL or below.  Eat a meal or snack within 1 hour once blood glucose levels return to normal.  Be alert to feeling very thirsty and urinating more frequently than usual, which are early signs of hyperglycemia. An early awareness of  hyperglycemia allows for prompt treatment. Treat hyperglycemia as directed by your health care provider.  Engage in at least 150 minutes of moderate-intensity physical activity a week, spread over at least 3 days of the week or as directed by your health care provider. In addition, you should engage in resistance exercise at least 2 times a week or as directed by your health care provider. Try to spend no more than 90 minutes at one time inactive.  Adjust your medicine and food intake as needed if you start a new exercise or sport.  Follow your sick-day plan anytime you are unable to eat or drink as usual.  Do not use any tobacco products including cigarettes, chewing tobacco, or electronic cigarettes. If you need help quitting, ask your health care provider.  Limit alcohol intake to no more than 1 drink per day for nonpregnant women and 2 drinks per day for men. You should drink alcohol only when you are also eating food. Talk with your health care provider whether alcohol is safe for you. Tell your health care provider if you drink alcohol several times a week.  Keep all follow-up visits as directed by your health care provider. This is important.  Schedule an eye exam soon after the diagnosis of type 2 diabetes and then annually.  Perform daily skin and foot care. Examine your skin and feet daily for cuts, bruises, redness, nail problems, bleeding, blisters, or sores. A foot exam by a health care provider should be done annually.  Brush your teeth and gums at least twice a day and floss at least once a day. Follow up with your dentist regularly.  Share your diabetes management plan with your workplace or school.  Keep your immunizations up to date. It is recommended that you receive a flu (influenza) vaccine every year. It is also recommended that you receive a pneumonia (pneumococcal) vaccine. If you are 10 years of age or older and have never received a pneumonia vaccine, this vaccine may  be given as a series of two separate shots. Ask your health care provider which additional vaccines may be recommended.  Learn to manage stress.  Obtain ongoing diabetes education and support as needed.  Participate in or seek rehabilitation as needed to maintain or improve independence and quality of life. Request a physical or occupational therapy referral if you are having foot or hand numbness, or difficulties with grooming, dressing, eating, or physical activity. SEEK MEDICAL CARE IF:   You are unable to eat food or drink fluids for more than 6 hours.  You have nausea and vomiting for more than 6 hours.  Your blood glucose level is  over 240 mg/dL.  There is a change in mental status.  You develop an additional serious illness.  You have diarrhea for more than 6 hours.  You have been sick or have had a fever for a couple of days and are not getting better.  You have pain during any physical activity.  SEEK IMMEDIATE MEDICAL CARE IF:  You have difficulty breathing.  You have moderate to large ketone levels.   This information is not intended to replace advice given to you by your health care provider. Make sure you discuss any questions you have with your health care provider.   Document Released: 05/07/2005 Document Revised: 01/26/2015 Document Reviewed: 12/04/2011 Elsevier Interactive Patient Education 2016 [employer].    Diabetes Mellitus and Food It is important for you to manage your blood sugar (glucose) level. Your blood glucose level can be greatly affected by what you eat. Eating healthier foods in the appropriate amounts throughout the day at about the same time each day will help you control your blood glucose level. It can also help slow or prevent worsening of your diabetes mellitus. Healthy eating may even help you improve the level of your blood pressure and reach or maintain a healthy weight.  General recommendations for healthful eating and cooking  habits include:  Eating meals and snacks regularly. Avoid going long periods of time without eating to lose weight.  Eating a diet that consists mainly of plant-based foods, such as fruits, vegetables, nuts, legumes, and whole grains.  Using low-heat cooking methods, such as baking, instead of high-heat cooking methods, such as deep frying. Work with your dietitian to make sure you understand how to use the Nutrition Facts information on food labels. HOW CAN FOOD AFFECT ME? Carbohydrates Carbohydrates affect your blood glucose level more than any other type of food. Your dietitian will help you determine how many carbohydrates to eat at each meal and teach you how to count carbohydrates. Counting carbohydrates is important to keep your blood glucose at a healthy level, especially if you are using insulin or taking certain medicines for diabetes mellitus. Alcohol Alcohol can cause sudden decreases in blood glucose (hypoglycemia), especially if you use insulin or take certain medicines for diabetes mellitus. Hypoglycemia can be a life-threatening condition. Symptoms of hypoglycemia (sleepiness, dizziness, and disorientation) are similar to symptoms of having too much alcohol.  If your health care provider has given you approval to drink alcohol, do so in moderation and use the following guidelines:  Women should not have more than one drink per day, and men should not have more than two drinks per day. One drink is equal to:  12 oz of beer.  5 oz of wine.  1 oz of hard liquor.  Do not drink on an empty stomach.  Keep yourself hydrated. Have water, diet soda, or unsweetened iced tea.  Regular soda, juice, and other mixers might contain a lot of carbohydrates and should be counted. WHAT FOODS ARE NOT RECOMMENDED? As you make food choices, it is important to remember that all foods are not the same. Some foods have fewer nutrients per serving than other foods, even though they might have the  same number of calories or carbohydrates. It is difficult to get your body what it needs when you eat foods with fewer nutrients. Examples of foods that you should avoid that are high in calories and carbohydrates but low in nutrients include:  Trans fats (most processed foods list trans fats on the Nutrition Facts  label).  Regular soda.  Juice.  Candy.  Sweets, such as cake, pie, doughnuts, and cookies.  Fried foods. WHAT FOODS CAN I EAT? Eat nutrient-rich foods, which will nourish your body and keep you healthy. The food you should eat also will depend on several factors, including:  The calories you need.  The medicines you take.  Your weight.  Your blood glucose level.  Your blood pressure level.  Your cholesterol level. You should eat a variety of foods, including:  Protein.  Lean cuts of meat.  Proteins low in saturated fats, such as fish, egg whites, and beans. Avoid processed meats.  Fruits and vegetables.  Fruits and vegetables that may help control blood glucose levels, such as apples, mangoes, and yams.  Dairy products.  Choose fat-free or low-fat dairy products, such as milk, yogurt, and cheese.  Grains, bread, pasta, and rice.  Choose whole grain products, such as multigrain bread, whole oats, and brown rice. These foods may help control blood pressure.  Fats.  Foods containing healthful fats, such as nuts, avocado, olive oil, canola oil, and fish. DOES EVERYONE WITH DIABETES MELLITUS HAVE THE SAME MEAL PLAN? Because every person with diabetes mellitus is different, there is not one meal plan that works for everyone. It is very important that you meet with a dietitian who will help you create a meal plan that is just right for you.   This information is not intended to replace advice given to you by your health care provider. Make sure you discuss any questions you have with your health care provider.   Document Released: 02/01/2005 Document  Revised: 05/28/2014 Document Reviewed: 04/03/2013 Elsevier Interactive Patient Education 2016 [employer] Inc.   Hematuria, Adult Hematuria is blood in your urine. It can be caused by a bladder infection, kidney infection, prostate infection, kidney stone, or cancer of your urinary tract. Infections can usually be treated with medicine, and a kidney stone usually will pass through your urine. If neither of these is the cause of your hematuria, further workup to find out the reason may be needed. It is very important that you tell your health care provider about any blood you see in your urine, even if the blood stops without treatment or happens without causing pain. Blood in your urine that happens and then stops and then happens again can be a symptom of a very serious condition. Also, pain is not a symptom in the initial stages of many urinary cancers. HOME CARE INSTRUCTIONS   Drink lots of fluid, 3-4 quarts a day. If you have been diagnosed with an infection, cranberry juice is especially recommended, in addition to large amounts of water.  Avoid caffeine, tea, and carbonated beverages because they tend to irritate the bladder.  Avoid alcohol because it may irritate the prostate.  Take all medicines as directed by your health care provider.  If you were prescribed an antibiotic medicine, finish it all even if you start to feel better.  If you have been diagnosed with a kidney stone, follow your health care provider's instructions regarding straining your urine to catch the stone.  Empty your bladder often. Avoid holding urine for long periods of time.  After a bowel movement, women should cleanse front to back. Use each tissue only once.  Empty your bladder before and after sexual intercourse if you are a male. SEEK MEDICAL CARE IF:  You develop back pain.  You have a fever.  You have a feeling of sickness in your stomach (  nausea) or vomiting.  Your symptoms are not better in 3  days. Return sooner if you are getting worse. SEEK IMMEDIATE MEDICAL CARE IF:   You develop severe vomiting and are unable to keep the medicine down.  You develop severe back or abdominal pain despite taking your medicines.  You begin passing a large amount of blood or clots in your urine.  You feel extremely weak or faint, or you pass out. MAKE SURE YOU:   Understand these instructions.  Will watch your condition.  Will get help right away if you are not doing well or get worse.   This information is not intended to replace advice given to you by your health care provider. Make sure you discuss any questions you have with your health care provider.   Document Released: 05/07/2005 Document Revised: 05/28/2014 Document Reviewed: 01/05/2013 Elsevier Interactive Patient Education [employer].

## 2015-03-06 NOTE — ED Notes (Signed)
Pt states over the last three days "I can feel my sugar being up." States his vision has been getting increasingly blurry over the last 3 days, and he's been feeling nauseous. Found his blood sugar to be 476 at home and decided to come in. No complaints of pain, vomiting, diarrhea. States he takes Metformin at home to manage his diabetes.
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
## 2015-03-07 DIAGNOSIS — N342 Other urethritis: Secondary | ICD-10-CM | POA: Diagnosis not present

## 2015-03-07 DIAGNOSIS — I119 Hypertensive heart disease without heart failure: Secondary | ICD-10-CM | POA: Diagnosis not present

## 2015-03-07 DIAGNOSIS — E1165 Type 2 diabetes mellitus with hyperglycemia: Secondary | ICD-10-CM | POA: Diagnosis not present

## 2015-03-07 DIAGNOSIS — I2510 Atherosclerotic heart disease of native coronary artery without angina pectoris: Secondary | ICD-10-CM | POA: Diagnosis not present

## 2015-03-23 DIAGNOSIS — E1351 Other specified diabetes mellitus with diabetic peripheral angiopathy without gangrene: Secondary | ICD-10-CM | POA: Diagnosis not present

## 2015-03-23 DIAGNOSIS — L602 Onychogryphosis: Secondary | ICD-10-CM | POA: Diagnosis not present

## 2015-03-29 DIAGNOSIS — Z113 Encounter for screening for infections with a predominantly sexual mode of transmission: Secondary | ICD-10-CM | POA: Diagnosis not present

## 2015-03-29 DIAGNOSIS — N485 Ulcer of penis: Secondary | ICD-10-CM | POA: Diagnosis not present

## 2015-03-29 DIAGNOSIS — N5089 Other specified disorders of the male genital organs: Secondary | ICD-10-CM | POA: Diagnosis not present

## 2015-04-12 ENCOUNTER — Ambulatory Visit (INDEPENDENT_AMBULATORY_CARE_PROVIDER_SITE_OTHER): Payer: Commercial Managed Care - HMO | Admitting: Emergency Medicine

## 2015-04-12 VITALS — BP 118/76 | HR 75 | Temp 97.7°F | Resp 18 | Ht 69.0 in | Wt 245.2 lb

## 2015-04-12 DIAGNOSIS — J209 Acute bronchitis, unspecified: Secondary | ICD-10-CM

## 2015-04-12 DIAGNOSIS — J0140 Acute pansinusitis, unspecified: Secondary | ICD-10-CM | POA: Diagnosis not present

## 2015-04-12 DIAGNOSIS — H35031 Hypertensive retinopathy, right eye: Secondary | ICD-10-CM | POA: Diagnosis not present

## 2015-04-12 DIAGNOSIS — H35032 Hypertensive retinopathy, left eye: Secondary | ICD-10-CM | POA: Diagnosis not present

## 2015-04-12 DIAGNOSIS — H02401 Unspecified ptosis of right eyelid: Secondary | ICD-10-CM | POA: Diagnosis not present

## 2015-04-12 DIAGNOSIS — H2513 Age-related nuclear cataract, bilateral: Secondary | ICD-10-CM | POA: Diagnosis not present

## 2015-04-12 DIAGNOSIS — H40013 Open angle with borderline findings, low risk, bilateral: Secondary | ICD-10-CM | POA: Diagnosis not present

## 2015-04-12 MED ORDER — PSEUDOEPHEDRINE-GUAIFENESIN ER 60-600 MG PO TB12: 1.0000 | ORAL_TABLET | Freq: Two times a day (BID) | ORAL | Status: DC

## 2015-04-12 MED ORDER — HYDROCOD POLST-CPM POLST ER 10-8 MG/5ML PO SUER: 5.0000 mL | Freq: Two times a day (BID) | ORAL | Status: DC

## 2015-04-12 MED ORDER — AMOXICILLIN-POT CLAVULANATE 875-125 MG PO TABS: 1.0000 | ORAL_TABLET | Freq: Two times a day (BID) | ORAL | Status: DC

## 2015-04-12 NOTE — Progress Notes (Signed)
Subjective:  Patient ID: James Maxwell, male    DOB: 03-Jan-1951  Age: 64 y.o. MRN: [id number]  CC: Sore Throat; Sinus Problem; Dental Problem; Eye Problem; and Cough   HPI [name] presents  with a upper respiratory infection since Sunday. Has nasal congestion and pressure in his cheeks and forehead. Has been to see his eye doctor for glued eyes. He has nasal discharge is purulent collar. With postnasal drainage and sore throat. He has a cough productive of purulent sputum. Wheezing or shortness of breath. No fever or chills. No improvement with over-the-counter medication no nausea vomiting or stool change or rash.  History James Maxwell has a past medical history of Hypertension; Diabetes mellitus without complication ([place]); Arthritis; MI, old (1997); CHF (congestive heart failure) ([place]) (2004); Coronary artery disease; Stented coronary artery; History of gout; and GERD (gastroesophageal reflux disease).   He has past surgical history that includes Coronary stent placement (2004); Revision total knee arthroplasty; Tonsillectomy; Abdominal surgery; Joint replacement; and Total knee arthroplasty (Left, 07/20/2013).   His  family history includes Cancer in his brother and brother; Diabetes in his mother; Heart attack in his brother, mother, and son; Hypertension in his mother, sister, and sister.  He   reports that he quit smoking about 19 years ago. He does not have any smokeless tobacco history on file. He reports that he does not drink alcohol or use illicit drugs.  Outpatient Prescriptions Prior to Visit  Medication Sig Dispense Refill  . amLODipine-atorvastatin (CADUET) 10-80 MG per tablet Take 1 tablet by mouth every morning.     [name] atenolol (TENORMIN) 100 MG tablet Take 100 mg by mouth every morning.     . Canagliflozin (INVOKANA) 100 MG TABS Take 100 mg by mouth every morning.     . Cholecalciferol (VITAMIN D PO) Take 1 tablet by mouth daily.    [name] lisinopril (PRINIVIL,ZESTRIL) 10 MG  tablet Take 10 mg by mouth every morning.     . metFORMIN (GLUCOPHAGE-XR) 500 MG 24 hr tablet Take 2 tablets by mouth 2 (two) times daily.  0  . nitroGLYCERIN (NITROSTAT) 0.4 MG SL tablet Place 1 tablet (0.4 mg total) under the tongue every 5 (five) minutes as needed for chest pain. 25 tablet 5  . oxyCODONE (ROXICODONE) 15 MG immediate release tablet Take 15 mg by mouth 4 (four) times daily.  0  . pantoprazole (PROTONIX) 40 MG tablet Take 40 mg by mouth daily.    . cephALEXin (KEFLEX) 500 MG capsule Take 1 capsule (500 mg total) by mouth 4 (four) times daily. (Patient not taking: Reported on 04/12/2015) 20 capsule 0  . CIALIS 20 MG tablet Take 20 mg by mouth daily as needed for erectile dysfunction.   0  . iron polysaccharides (NIFEREX) 150 MG capsule Take 1 capsule (150 mg total) by mouth daily. (Patient not taking: Reported on 04/12/2015) 21 capsule 0  . metFORMIN (GLUCOPHAGE) 1000 MG tablet Take 1 tablet (1,000 mg total) by mouth 2 (two) times daily with a meal. (Patient not taking: Reported on 03/06/2015) 180 tablet 3  . oxyCODONE (OXY IR/ROXICODONE) 5 MG immediate release tablet Take 1-3 tablets (5-15 mg total) by mouth every 3 (three) hours as needed for moderate pain, severe pain or breakthrough pain. (Patient not taking: Reported on 03/06/2015) 80 tablet 0  . potassium chloride SA (K-DUR,KLOR-CON) 20 MEQ tablet Take 20 mEq by mouth daily.    . rivaroxaban (XARELTO) 10 MG TABS tablet Take 1 tablet (10 mg total)  by mouth daily with breakfast. Take Xarelto for two and a half more weeks, then discontinue Xarelto. Once the patient has completed the Xarelto, they may resume the 325 mg Aspirin and the Plavix 75 mg. (Patient not taking: Reported on 03/06/2015) 19 tablet 0   No facility-administered medications prior to visit.    Social History   Social History  . Marital Status: Married    Spouse Name: N/A  . Number of Children: N/A  . Years of Education: N/A   Social History Main Topics  .  Smoking status: Former Smoker    Quit date: 07/16/1995  . Smokeless tobacco: None  . Alcohol Use: No  . Drug Use: No  . Sexual Activity: Not Asked   Other Topics Concern  . None   Social History Narrative     Review of Systems  Constitutional: Positive for chills and fatigue. Negative for fever and appetite change.  HENT: Positive for congestion, postnasal drip, rhinorrhea and sinus pressure. Negative for ear pain and sore throat.   Eyes: Negative for pain and redness.  Respiratory: Positive for cough. Negative for shortness of breath and wheezing.   Cardiovascular: Negative for leg swelling.  Gastrointestinal: Negative for nausea, vomiting, abdominal pain, diarrhea, constipation and blood in stool.  Endocrine: Negative for polyuria.  Genitourinary: Negative for dysuria, urgency, frequency and flank pain.  Musculoskeletal: Negative for gait problem.  Skin: Negative for rash.  Neurological: Negative for weakness and headaches.  Psychiatric/Behavioral: Negative for confusion and decreased concentration. The patient is not nervous/anxious.     Objective:  BP 118/76 mmHg  Pulse 75  Temp(Src) 97.7 F (36.5 C) (Oral)  Resp 18  Ht 5\' 9"  (1.753 m)  Wt 245 lb 3.2 oz (111.222 kg)  BMI 36.19 kg/m2  SpO2 96%  Physical Exam  Constitutional: He is oriented to person, place, and time. He appears well-developed and well-nourished. No distress.  HENT:  Head: Normocephalic and atraumatic.  Right Ear: External ear normal.  Left Ear: External ear normal.  Nose: Nose normal.  Eyes: Conjunctivae and EOM are normal. Pupils are equal, round, and reactive to light. No scleral icterus.  Neck: Normal range of motion. Neck supple. No tracheal deviation present.  Cardiovascular: Normal rate, regular rhythm and normal heart sounds.   Pulmonary/Chest: Effort normal. No respiratory distress. He has no wheezes. He has no rales.  Abdominal: He exhibits no mass. There is no tenderness. There is no  rebound and no guarding.  Musculoskeletal: He exhibits no edema.  Lymphadenopathy:    He has no cervical adenopathy.  Neurological: He is alert and oriented to person, place, and time. Coordination normal.  Skin: Skin is warm and dry. No rash noted.  Psychiatric: He has a normal mood and affect. His behavior is normal.      Assessment & Plan:   [place] was seen today for sore throat, sinus problem, dental problem, eye problem and cough.  Diagnoses and all orders for this visit:  Acute bronchitis, unspecified organism  Acute pansinusitis, recurrence not specified  Other orders -     amoxicillin-clavulanate (AUGMENTIN) 875-125 MG tablet; Take 1 tablet by mouth 2 (two) times daily. -     pseudoephedrine-guaifenesin (MUCINEX D) 60-600 MG 12 hr tablet; Take 1 tablet by mouth every 12 (twelve) hours. -     chlorpheniramine-HYDROcodone (TUSSIONEX PENNKINETIC ER) 10-8 MG/5ML SUER; Take 5 mLs by mouth 2 (two) times daily.   I am having Mr. Morden start on amoxicillin-clavulanate, pseudoephedrine-guaifenesin, and chlorpheniramine-HYDROcodone. I am  also having him maintain his potassium chloride SA, lisinopril, pantoprazole, atenolol, amLODipine-atorvastatin, metFORMIN, canagliflozin, nitroGLYCERIN, iron polysaccharides, oxyCODONE, rivaroxaban, CIALIS, Cholecalciferol (VITAMIN D PO), metFORMIN, oxyCODONE, cephALEXin, clopidogrel, and aspirin.  Meds ordered this encounter  Medications  . clopidogrel (PLAVIX) 75 MG tablet    Sig: Take 75 mg by mouth daily.  [name] aspirin 81 MG tablet    Sig: Take 81 mg by mouth daily.  [name] amoxicillin-clavulanate (AUGMENTIN) 875-125 MG tablet    Sig: Take 1 tablet by mouth 2 (two) times daily.    Dispense:  20 tablet    Refill:  0  . pseudoephedrine-guaifenesin (MUCINEX D) 60-600 MG 12 hr tablet    Sig: Take 1 tablet by mouth every 12 (twelve) hours.    Dispense:  18 tablet    Refill:  0  . chlorpheniramine-HYDROcodone (TUSSIONEX PENNKINETIC ER) 10-8 MG/5ML  SUER    Sig: Take 5 mLs by mouth 2 (two) times daily.    Dispense:  60 mL    Refill:  0    Appropriate red flag conditions were discussed with the patient as well as actions that should be taken.  Patient expressed his understanding.  Follow-up: Return if symptoms worsen or fail to improve.  Roselee Culver, MD

## 2015-04-12 NOTE — Patient Instructions (Signed)

## 2015-05-03 DIAGNOSIS — G894 Chronic pain syndrome: Secondary | ICD-10-CM | POA: Diagnosis not present

## 2015-05-12 DIAGNOSIS — H25013 Cortical age-related cataract, bilateral: Secondary | ICD-10-CM | POA: Diagnosis not present

## 2015-05-12 DIAGNOSIS — H25011 Cortical age-related cataract, right eye: Secondary | ICD-10-CM | POA: Diagnosis not present

## 2015-05-12 DIAGNOSIS — H2511 Age-related nuclear cataract, right eye: Secondary | ICD-10-CM | POA: Diagnosis not present

## 2015-05-12 DIAGNOSIS — H40013 Open angle with borderline findings, low risk, bilateral: Secondary | ICD-10-CM | POA: Diagnosis not present

## 2015-05-12 DIAGNOSIS — H2513 Age-related nuclear cataract, bilateral: Secondary | ICD-10-CM | POA: Diagnosis not present

## 2015-05-31 DIAGNOSIS — Z79891 Long term (current) use of opiate analgesic: Secondary | ICD-10-CM | POA: Diagnosis not present

## 2015-05-31 DIAGNOSIS — G890 Central pain syndrome: Secondary | ICD-10-CM | POA: Diagnosis not present

## 2015-05-31 DIAGNOSIS — M79604 Pain in right leg: Secondary | ICD-10-CM | POA: Diagnosis not present

## 2015-05-31 DIAGNOSIS — G894 Chronic pain syndrome: Secondary | ICD-10-CM | POA: Diagnosis not present

## 2015-05-31 DIAGNOSIS — M25562 Pain in left knee: Secondary | ICD-10-CM | POA: Diagnosis not present

## 2015-05-31 DIAGNOSIS — M545 Low back pain: Secondary | ICD-10-CM | POA: Diagnosis not present

## 2015-05-31 DIAGNOSIS — M25561 Pain in right knee: Secondary | ICD-10-CM | POA: Diagnosis not present

## 2015-05-31 DIAGNOSIS — M79605 Pain in left leg: Secondary | ICD-10-CM | POA: Diagnosis not present

## 2015-06-01 DIAGNOSIS — L602 Onychogryphosis: Secondary | ICD-10-CM | POA: Diagnosis not present

## 2015-06-01 DIAGNOSIS — E1351 Other specified diabetes mellitus with diabetic peripheral angiopathy without gangrene: Secondary | ICD-10-CM | POA: Diagnosis not present

## 2015-06-03 ENCOUNTER — Encounter: Payer: Self-pay | Admitting: Interventional Cardiology

## 2015-06-03 ENCOUNTER — Ambulatory Visit (INDEPENDENT_AMBULATORY_CARE_PROVIDER_SITE_OTHER): Payer: Managed Care, Other (non HMO) | Admitting: Interventional Cardiology

## 2015-06-03 VITALS — BP 158/100 | HR 86 | Ht 69.0 in | Wt 250.0 lb

## 2015-06-03 DIAGNOSIS — I2510 Atherosclerotic heart disease of native coronary artery without angina pectoris: Secondary | ICD-10-CM | POA: Diagnosis not present

## 2015-06-03 DIAGNOSIS — I10 Essential (primary) hypertension: Secondary | ICD-10-CM

## 2015-06-03 DIAGNOSIS — Z01810 Encounter for preprocedural cardiovascular examination: Secondary | ICD-10-CM

## 2015-06-03 DIAGNOSIS — E119 Type 2 diabetes mellitus without complications: Secondary | ICD-10-CM

## 2015-06-03 DIAGNOSIS — E785 Hyperlipidemia, unspecified: Secondary | ICD-10-CM
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
## 2015-06-03 NOTE — Patient Instructions (Signed)
Medication Instructions:  Stop taking Aspirin-All other medications remain the same  Labwork: None  Testing/Procedures: None  Follow-Up: Your physician wants you to follow-up in: 1 year. You will receive a reminder letter in the mail two months in advance. If you don't receive a letter, please call our office to schedule the follow-up appointment.   Any Other Special Instructions Will Be Listed Below (If Applicable). Please monitor your blood pressure daily. Call the office if your systolic (top number) blood pressure is consistently above 140.      If you need a refill on your cardiac medications before your next appointment, please call your pharmacy.

## 2015-06-06 DIAGNOSIS — E1165 Type 2 diabetes mellitus with hyperglycemia: Secondary | ICD-10-CM | POA: Diagnosis not present

## 2015-06-06 DIAGNOSIS — I2510 Atherosclerotic heart disease of native coronary artery without angina pectoris: Secondary | ICD-10-CM | POA: Diagnosis not present

## 2015-06-06 DIAGNOSIS — I119 Hypertensive heart disease without heart failure: Secondary | ICD-10-CM | POA: Diagnosis not present

## 2015-06-06 DIAGNOSIS — N521 Erectile dysfunction due to diseases classified elsewhere: Secondary | ICD-10-CM | POA: Diagnosis not present

## 2015-06-07 ENCOUNTER — Encounter: Payer: Self-pay | Admitting: Interventional Cardiology

## 2015-06-08 DIAGNOSIS — H2511 Age-related nuclear cataract, right eye: Secondary | ICD-10-CM | POA: Diagnosis not present

## 2015-06-14 DIAGNOSIS — H2512 Age-related nuclear cataract, left eye: Secondary | ICD-10-CM | POA: Diagnosis not present

## 2015-06-14 DIAGNOSIS — H25012 Cortical age-related cataract, left eye: Secondary | ICD-10-CM | POA: Diagnosis not present

## 2015-06-22 DIAGNOSIS — H2512 Age-related nuclear cataract, left eye: Secondary | ICD-10-CM | POA: Diagnosis not present

## 2015-06-29 DIAGNOSIS — G894 Chronic pain syndrome: Secondary | ICD-10-CM | POA: Diagnosis not present

## 2015-06-29 DIAGNOSIS — Z79891 Long term (current) use of opiate analgesic: Secondary | ICD-10-CM | POA: Diagnosis not present

## 2015-06-29 DIAGNOSIS — M25561 Pain in right knee: Secondary | ICD-10-CM | POA: Diagnosis not present

## 2015-06-29 DIAGNOSIS — M25562 Pain in left knee: Secondary | ICD-10-CM | POA: Diagnosis not present

## 2015-06-29 DIAGNOSIS — M545 Low back pain: Secondary | ICD-10-CM | POA: Diagnosis not present

## 2015-07-27 DIAGNOSIS — Z79891 Long term (current) use of opiate analgesic: Secondary | ICD-10-CM | POA: Diagnosis not present

## 2015-07-27 DIAGNOSIS — G894 Chronic pain syndrome: Secondary | ICD-10-CM | POA: Diagnosis not present

## 2015-07-27 DIAGNOSIS — M25562 Pain in left knee: Secondary | ICD-10-CM | POA: Diagnosis not present

## 2015-07-27 DIAGNOSIS — M25561 Pain in right knee: Secondary | ICD-10-CM | POA: Diagnosis not present

## 2015-07-27 DIAGNOSIS — M79605 Pain in left leg: Secondary | ICD-10-CM | POA: Diagnosis not present

## 2015-07-27 DIAGNOSIS — M79604 Pain in right leg: Secondary | ICD-10-CM | POA: Diagnosis not present

## 2015-07-27 DIAGNOSIS — M545 Low back pain: Secondary | ICD-10-CM | POA: Diagnosis not present

## 2015-08-03 DIAGNOSIS — E1351 Other specified diabetes mellitus with diabetic peripheral angiopathy without gangrene: Secondary | ICD-10-CM | POA: Diagnosis not present

## 2015-08-03 DIAGNOSIS — L602 Onychogryphosis: Secondary | ICD-10-CM | POA: Diagnosis not present

## 2015-08-23 DIAGNOSIS — I119 Hypertensive heart disease without heart failure: Secondary | ICD-10-CM | POA: Diagnosis not present

## 2015-08-23 DIAGNOSIS — I2510 Atherosclerotic heart disease of native coronary artery without angina pectoris: Secondary | ICD-10-CM | POA: Diagnosis not present

## 2015-08-23 DIAGNOSIS — E1165 Type 2 diabetes mellitus with hyperglycemia: Secondary | ICD-10-CM | POA: Diagnosis not present

## 2015-08-26 DIAGNOSIS — G890 Central pain syndrome: Secondary | ICD-10-CM | POA: Diagnosis not present

## 2015-08-26 DIAGNOSIS — G541 Lumbosacral plexus disorders: Secondary | ICD-10-CM | POA: Diagnosis not present

## 2015-08-26 DIAGNOSIS — M5431 Sciatica, right side: Secondary | ICD-10-CM | POA: Diagnosis not present

## 2015-08-26 DIAGNOSIS — G894 Chronic pain syndrome: Secondary | ICD-10-CM | POA: Diagnosis not present

## 2015-08-26 DIAGNOSIS — G603 Idiopathic progressive neuropathy: Secondary | ICD-10-CM | POA: Diagnosis not present

## 2015-08-26 DIAGNOSIS — M5441 Lumbago with sciatica, right side: Secondary | ICD-10-CM | POA: Diagnosis not present

## 2015-09-23 DIAGNOSIS — G541 Lumbosacral plexus disorders: Secondary | ICD-10-CM | POA: Diagnosis not present

## 2015-09-23 DIAGNOSIS — G603 Idiopathic progressive neuropathy: Secondary | ICD-10-CM | POA: Diagnosis not present

## 2015-09-23 DIAGNOSIS — M5441 Lumbago with sciatica, right side: Secondary | ICD-10-CM | POA: Diagnosis not present

## 2015-09-23 DIAGNOSIS — M5431 Sciatica, right side: Secondary | ICD-10-CM | POA: Diagnosis not present

## 2015-09-23 DIAGNOSIS — G894 Chronic pain syndrome: Secondary | ICD-10-CM | POA: Diagnosis not present

## 2015-09-23 DIAGNOSIS — G890 Central pain syndrome: Secondary | ICD-10-CM | POA: Diagnosis not present

## 2015-09-23 DIAGNOSIS — Z79891 Long term (current) use of opiate analgesic: Secondary | ICD-10-CM | POA: Diagnosis not present

## 2015-10-05 DIAGNOSIS — E1351 Other specified diabetes mellitus with diabetic peripheral angiopathy without gangrene: Secondary | ICD-10-CM | POA: Diagnosis not present

## 2015-10-05 DIAGNOSIS — L602 Onychogryphosis: Secondary | ICD-10-CM | POA: Diagnosis not present

## 2015-10-11 DIAGNOSIS — R413 Other amnesia: Secondary | ICD-10-CM | POA: Diagnosis not present

## 2015-10-11 DIAGNOSIS — R350 Frequency of micturition: Secondary | ICD-10-CM | POA: Diagnosis not present

## 2015-10-11 DIAGNOSIS — Z79899 Other long term (current) drug therapy: Secondary | ICD-10-CM | POA: Diagnosis not present

## 2015-10-11 DIAGNOSIS — M791 Myalgia: Secondary | ICD-10-CM | POA: Diagnosis not present

## 2015-10-21 DIAGNOSIS — M5137 Other intervertebral disc degeneration, lumbosacral region: Secondary | ICD-10-CM | POA: Diagnosis not present

## 2015-10-21 DIAGNOSIS — M25562 Pain in left knee: Secondary | ICD-10-CM | POA: Diagnosis not present

## 2015-10-21 DIAGNOSIS — M25561 Pain in right knee: Secondary | ICD-10-CM | POA: Diagnosis not present

## 2015-10-21 DIAGNOSIS — M5431 Sciatica, right side: Secondary | ICD-10-CM | POA: Diagnosis not present

## 2015-10-21 DIAGNOSIS — M5441 Lumbago with sciatica, right side: Secondary | ICD-10-CM | POA: Diagnosis not present

## 2015-10-21 DIAGNOSIS — Z79891 Long term (current) use of opiate analgesic: Secondary | ICD-10-CM | POA: Diagnosis not present

## 2015-10-21 DIAGNOSIS — M545 Low back pain: Secondary | ICD-10-CM | POA: Diagnosis not present

## 2015-11-14 DIAGNOSIS — H2013 Chronic iridocyclitis, bilateral: Secondary | ICD-10-CM | POA: Diagnosis not present

## 2015-11-14 DIAGNOSIS — H40013 Open angle with borderline findings, low risk, bilateral: Secondary | ICD-10-CM | POA: Diagnosis not present

## 2015-11-28 DIAGNOSIS — M545 Low back pain: Secondary | ICD-10-CM | POA: Diagnosis not present

## 2015-11-28 DIAGNOSIS — M79605 Pain in left leg: Secondary | ICD-10-CM | POA: Diagnosis not present

## 2015-11-28 DIAGNOSIS — M79604 Pain in right leg: Secondary | ICD-10-CM | POA: Diagnosis not present

## 2015-11-28 DIAGNOSIS — M25561 Pain in right knee: Secondary | ICD-10-CM | POA: Diagnosis not present

## 2015-11-28 DIAGNOSIS — G894 Chronic pain syndrome: Secondary | ICD-10-CM | POA: Diagnosis not present

## 2015-11-28 DIAGNOSIS — Z79891 Long term (current) use of opiate analgesic: Secondary | ICD-10-CM | POA: Diagnosis not present

## 2015-11-28 DIAGNOSIS — M25562 Pain in left knee: Secondary | ICD-10-CM | POA: Diagnosis not present

## 2015-12-07 ENCOUNTER — Encounter (INDEPENDENT_AMBULATORY_CARE_PROVIDER_SITE_OTHER): Payer: Managed Care, Other (non HMO) | Admitting: Ophthalmology

## 2015-12-07 DIAGNOSIS — I10 Essential (primary) hypertension: Secondary | ICD-10-CM | POA: Diagnosis not present

## 2015-12-07 DIAGNOSIS — H59033 Cystoid macular edema following cataract surgery, bilateral: Secondary | ICD-10-CM

## 2015-12-07 DIAGNOSIS — H43813 Vitreous degeneration, bilateral: Secondary | ICD-10-CM | POA: Diagnosis not present

## 2015-12-07 DIAGNOSIS — H35033 Hypertensive retinopathy, bilateral: Secondary | ICD-10-CM

## 2015-12-08 DIAGNOSIS — L602 Onychogryphosis: Secondary | ICD-10-CM | POA: Diagnosis not present

## 2015-12-08 DIAGNOSIS — E1351 Other specified diabetes mellitus with diabetic peripheral angiopathy without gangrene: Secondary | ICD-10-CM | POA: Diagnosis not present

## 2015-12-08 DIAGNOSIS — L84 Corns and callosities: Secondary | ICD-10-CM | POA: Diagnosis not present

## 2015-12-08 DIAGNOSIS — I70293 Other atherosclerosis of native arteries of extremities, bilateral legs: Secondary | ICD-10-CM | POA: Diagnosis not present

## 2015-12-26 DIAGNOSIS — M25561 Pain in right knee: Secondary | ICD-10-CM | POA: Diagnosis not present

## 2015-12-26 DIAGNOSIS — G894 Chronic pain syndrome: Secondary | ICD-10-CM | POA: Diagnosis not present

## 2015-12-26 DIAGNOSIS — M545 Low back pain: Secondary | ICD-10-CM | POA: Diagnosis not present

## 2015-12-26 DIAGNOSIS — Z79891 Long term (current) use of opiate analgesic: Secondary | ICD-10-CM | POA: Diagnosis not present

## 2015-12-26 DIAGNOSIS — M25562 Pain in left knee: Secondary | ICD-10-CM | POA: Diagnosis not present

## 2016-01-02 DIAGNOSIS — Z23 Encounter for immunization: Secondary | ICD-10-CM | POA: Diagnosis not present

## 2016-01-04 ENCOUNTER — Encounter (INDEPENDENT_AMBULATORY_CARE_PROVIDER_SITE_OTHER): Payer: Managed Care, Other (non HMO) | Admitting: Ophthalmology

## 2016-01-04 DIAGNOSIS — I10 Essential (primary) hypertension: Secondary | ICD-10-CM

## 2016-01-04 DIAGNOSIS — H43813 Vitreous degeneration, bilateral: Secondary | ICD-10-CM

## 2016-01-04 DIAGNOSIS — H35033 Hypertensive retinopathy, bilateral: Secondary | ICD-10-CM

## 2016-01-04 DIAGNOSIS — H59033 Cystoid macular edema following cataract surgery, bilateral: Secondary | ICD-10-CM | POA: Diagnosis not present

## 2016-01-06 DIAGNOSIS — H2013 Chronic iridocyclitis, bilateral: Secondary | ICD-10-CM | POA: Diagnosis not present

## 2016-01-06 DIAGNOSIS — H40013 Open angle with borderline findings, low risk, bilateral: Secondary | ICD-10-CM | POA: Diagnosis not present

## 2016-01-11 ENCOUNTER — Emergency Department (HOSPITAL_COMMUNITY)
Admission: EM | Admit: 2016-01-11 | Discharge: 2016-01-12 | Disposition: A | Discharge status: Home / Self Care | Payer: Managed Care, Other (non HMO) | Attending: Emergency Medicine | Admitting: Emergency Medicine

## 2016-01-11 ENCOUNTER — Emergency Department (HOSPITAL_COMMUNITY): Payer: Managed Care, Other (non HMO)

## 2016-01-11 ENCOUNTER — Encounter (HOSPITAL_COMMUNITY): Payer: Self-pay | Admitting: Emergency Medicine

## 2016-01-11 DIAGNOSIS — Z79899 Other long term (current) drug therapy: Secondary | ICD-10-CM | POA: Diagnosis not present

## 2016-01-11 DIAGNOSIS — J40 Bronchitis, not specified as acute or chronic: Secondary | ICD-10-CM | POA: Diagnosis not present

## 2016-01-11 DIAGNOSIS — I2510 Atherosclerotic heart disease of native coronary artery without angina pectoris: Secondary | ICD-10-CM | POA: Insufficient documentation

## 2016-01-11 DIAGNOSIS — I252 Old myocardial infarction: Secondary | ICD-10-CM | POA: Diagnosis not present

## 2016-01-11 DIAGNOSIS — I110 Hypertensive heart disease with heart failure: Secondary | ICD-10-CM | POA: Insufficient documentation

## 2016-01-11 DIAGNOSIS — Z955 Presence of coronary angioplasty implant and graft: Secondary | ICD-10-CM | POA: Diagnosis not present

## 2016-01-11 DIAGNOSIS — R05 Cough: Secondary | ICD-10-CM | POA: Diagnosis present

## 2016-01-11 DIAGNOSIS — Z87891 Personal history of nicotine dependence: Secondary | ICD-10-CM | POA: Diagnosis not present

## 2016-01-11 DIAGNOSIS — I509 Heart failure, unspecified: Secondary | ICD-10-CM | POA: Insufficient documentation

## 2016-01-11 DIAGNOSIS — Z7984 Long term (current) use of oral hypoglycemic drugs: Secondary | ICD-10-CM | POA: Diagnosis not present

## 2016-01-11 DIAGNOSIS — R079 Chest pain, unspecified: Secondary | ICD-10-CM | POA: Diagnosis not present

## 2016-01-11 DIAGNOSIS — R0602 Shortness of breath: Secondary | ICD-10-CM | POA: Diagnosis not present

## 2016-01-11 LAB — BASIC METABOLIC PANEL
ANION GAP: 9 (ref 5–15)
BUN: 11 mg/dL (ref 6–20)
CALCIUM: 9.1 mg/dL (ref 8.9–10.3)
CHLORIDE: 103 mmol/L (ref 101–111)
CO2: 23 mmol/L (ref 22–32)
Creatinine, Ser: 1.16 mg/dL (ref 0.61–1.24)
GFR calc non Af Amer: >60 mL/min (ref >60)
GLUCOSE: 282 mg/dL — AB (ref 65–99)
POTASSIUM: 4 mmol/L (ref 3.5–5.1)
Sodium: 135 mmol/L (ref 135–145)

## 2016-01-11 LAB — CBC
HEMATOCRIT: 42.2 % (ref 39.0–52.0)
HEMOGLOBIN: 14.9 g/dL (ref 13.0–17.0)
MCH: 29.5 pg (ref 26.0–34.0)
MCHC: 35.3 g/dL (ref 30.0–36.0)
MCV: 83.6 fL (ref 78.0–100.0)
Platelets: 237 10*3/uL (ref 150–400)
RBC: 5.05 MIL/uL (ref 4.22–5.81)
RDW: 13.2 % (ref 11.5–15.5)
WBC: 5.6 10*3/uL (ref 4.0–10.5)

## 2016-01-11 LAB — I-STAT TROPONIN, ED: TROPONIN I, POC: 0.01 ng/mL (ref 0.00–0.08)

## 2016-01-11 MED ORDER — NAPROXEN 500 MG PO TABS
500.0000 mg | ORAL_TABLET | Freq: Once | ORAL | Status: AC
  Administered 2016-01-12: 500 mg via ORAL
  Filled 2016-01-11: qty 1

## 2016-01-11 MED ORDER — ALBUTEROL SULFATE HFA 108 (90 BASE) MCG/ACT IN AERS
2.0000 | INHALATION_SPRAY | Freq: Once | RESPIRATORY_TRACT | Status: AC
  Administered 2016-01-12: 2 via RESPIRATORY_TRACT
  Filled 2016-01-11: qty 6.7
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
## 2016-01-11 NOTE — ED Triage Notes (Signed)
Pt states he is having tightness in his chest and has had a cough  Pt states his sides and ribs are sore from coughing so much  States he has had a cough for a week and the tightness in his chest started 2 days ago  Pt states he has shortness of breath esp at night   States he feels tired and weak

## 2016-01-12 DIAGNOSIS — E1165 Type 2 diabetes mellitus with hyperglycemia: Secondary | ICD-10-CM | POA: Diagnosis not present

## 2016-01-12 DIAGNOSIS — I119 Hypertensive heart disease without heart failure: Secondary | ICD-10-CM | POA: Diagnosis not present

## 2016-01-12 DIAGNOSIS — I2510 Atherosclerotic heart disease of native coronary artery without angina pectoris: Secondary | ICD-10-CM | POA: Diagnosis not present

## 2016-01-12 DIAGNOSIS — J40 Bronchitis, not specified as acute or chronic: Secondary | ICD-10-CM | POA: Diagnosis not present

## 2016-01-12 MED ORDER — AZITHROMYCIN 250 MG PO TABS: 250.0000 mg | ORAL_TABLET | Freq: Every day | ORAL | 0 refills | Status: DC

## 2016-01-12 MED ORDER — NAPROXEN 500 MG PO TABS: 500.0000 mg | ORAL_TABLET | Freq: Two times a day (BID) | ORAL | 0 refills | Status: DC

## 2016-01-12 MED ORDER — ALBUTEROL SULFATE HFA 108 (90 BASE) MCG/ACT IN AERS: 1.0000 | INHALATION_SPRAY | RESPIRATORY_TRACT | 0 refills | Status: DC | PRN

## 2016-01-24 DIAGNOSIS — Z79891 Long term (current) use of opiate analgesic: Secondary | ICD-10-CM | POA: Diagnosis not present

## 2016-01-24 DIAGNOSIS — M545 Low back pain: Secondary | ICD-10-CM | POA: Diagnosis not present

## 2016-01-24 DIAGNOSIS — M25561 Pain in right knee: Secondary | ICD-10-CM | POA: Diagnosis not present

## 2016-01-24 DIAGNOSIS — G894 Chronic pain syndrome: Secondary | ICD-10-CM | POA: Diagnosis not present

## 2016-01-24 DIAGNOSIS — M25562 Pain in left knee: Secondary | ICD-10-CM | POA: Diagnosis not present

## 2016-01-24 DIAGNOSIS — G890 Central pain syndrome: Secondary | ICD-10-CM | POA: Diagnosis not present

## 2016-02-18 ENCOUNTER — Encounter (HOSPITAL_COMMUNITY): Payer: Self-pay | Admitting: Nurse Practitioner

## 2016-02-18 ENCOUNTER — Emergency Department (HOSPITAL_COMMUNITY): Payer: Managed Care, Other (non HMO)

## 2016-02-18 ENCOUNTER — Emergency Department (HOSPITAL_COMMUNITY)
Admission: EM | Admit: 2016-02-18 | Discharge: 2016-02-18 | Disposition: A | Discharge status: Home / Self Care | Payer: Managed Care, Other (non HMO) | Attending: Emergency Medicine | Admitting: Emergency Medicine

## 2016-02-18 DIAGNOSIS — Z7984 Long term (current) use of oral hypoglycemic drugs: Secondary | ICD-10-CM | POA: Insufficient documentation

## 2016-02-18 DIAGNOSIS — I252 Old myocardial infarction: Secondary | ICD-10-CM | POA: Diagnosis not present

## 2016-02-18 DIAGNOSIS — Z7951 Long term (current) use of inhaled steroids: Secondary | ICD-10-CM | POA: Insufficient documentation

## 2016-02-18 DIAGNOSIS — I509 Heart failure, unspecified: Secondary | ICD-10-CM | POA: Diagnosis not present

## 2016-02-18 DIAGNOSIS — Z79899 Other long term (current) drug therapy: Secondary | ICD-10-CM | POA: Insufficient documentation

## 2016-02-18 DIAGNOSIS — I2510 Atherosclerotic heart disease of native coronary artery without angina pectoris: Secondary | ICD-10-CM | POA: Insufficient documentation

## 2016-02-18 DIAGNOSIS — I110 Hypertensive heart disease with heart failure: Secondary | ICD-10-CM | POA: Diagnosis not present

## 2016-02-18 DIAGNOSIS — Z87891 Personal history of nicotine dependence: Secondary | ICD-10-CM | POA: Diagnosis not present

## 2016-02-18 DIAGNOSIS — E1165 Type 2 diabetes mellitus with hyperglycemia: Secondary | ICD-10-CM | POA: Insufficient documentation

## 2016-02-18 DIAGNOSIS — R739 Hyperglycemia, unspecified: Secondary | ICD-10-CM

## 2016-02-18 DIAGNOSIS — R42 Dizziness and giddiness: Secondary | ICD-10-CM

## 2016-02-18 LAB — CBG MONITORING, ED
GLUCOSE-CAPILLARY: 399 mg/dL — AB (ref 65–99)
GLUCOSE-CAPILLARY: 281 mg/dL — AB (ref 65–99)

## 2016-02-18 LAB — COMPREHENSIVE METABOLIC PANEL
ALBUMIN: 3.9 g/dL (ref 3.5–5.0)
ALT: 16 U/L — AB (ref 17–63)
AST: 26 U/L (ref 15–41)
Alkaline Phosphatase: 69 U/L (ref 38–126)
Anion gap: 11 (ref 5–15)
BUN: 18 mg/dL (ref 6–20)
CALCIUM: 9.5 mg/dL (ref 8.9–10.3)
CHLORIDE: 97 mmol/L — AB (ref 101–111)
CO2: 22 mmol/L (ref 22–32)
CREATININE: 1.21 mg/dL (ref 0.61–1.24)
GFR calc non Af Amer: >60 mL/min (ref >60)
Glucose, Bld: 581 mg/dL (ref 65–99)
Potassium: 4.8 mmol/L (ref 3.5–5.1)
SODIUM: 130 mmol/L — AB (ref 135–145)
Total Bilirubin: 1.3 mg/dL — ABNORMAL HIGH (ref 0.3–1.2)
Total Protein: 7.7 g/dL (ref 6.5–8.1)

## 2016-02-18 LAB — CBC WITH DIFFERENTIAL/PLATELET
BASOS ABS: 0 10*3/uL (ref 0.0–0.1)
BASOS PCT: 0 %
EOS ABS: 0.1 10*3/uL (ref 0.0–0.7)
EOS PCT: 1 %
HCT: 44.7 % (ref 39.0–52.0)
Hemoglobin: 15.9 g/dL (ref 13.0–17.0)
Lymphocytes Relative: 40 %
Lymphs Abs: 3.2 10*3/uL (ref 0.7–4.0)
MCH: 30 pg (ref 26.0–34.0)
MCHC: 35.6 g/dL (ref 30.0–36.0)
MCV: 84.3 fL (ref 78.0–100.0)
Monocytes Absolute: 0.5 10*3/uL (ref 0.1–1.0)
Monocytes Relative: 6 %
Neutro Abs: 4.3 10*3/uL (ref 1.7–7.7)
Neutrophils Relative %: 53 %
PLATELETS: 237 10*3/uL (ref 150–400)
RBC: 5.3 MIL/uL (ref 4.22–5.81)
RDW: 13.4 % (ref 11.5–15.5)
WBC: 8.1 10*3/uL (ref 4.0–10.5)

## 2016-02-18 LAB — URINALYSIS, ROUTINE W REFLEX MICROSCOPIC
Bilirubin Urine: NEGATIVE
HGB URINE DIPSTICK: NEGATIVE
Ketones, ur: NEGATIVE mg/dL
Leukocytes, UA: NEGATIVE
Nitrite: NEGATIVE
PROTEIN: NEGATIVE mg/dL
Specific Gravity, Urine: 1.036 — ABNORMAL HIGH (ref 1.005–1.030)
pH: 5 (ref 5.0–8.0)

## 2016-02-18 LAB — BLOOD GAS, VENOUS
ACID-BASE DEFICIT: 2.6 mmol/L — AB (ref 0.0–2.0)
Bicarbonate: 21.8 mmol/L (ref 20.0–28.0)
O2 SAT: 89.1 %
PATIENT TEMPERATURE: 98.6
PO2 VEN: 61.9 mmHg — AB (ref 32.0–45.0)
pCO2, Ven: 38.6 mmHg — ABNORMAL LOW (ref 44.0–60.0)
pH, Ven: 7.371 (ref 7.250–7.430)

## 2016-02-18 LAB — URINE MICROSCOPIC-ADD ON: RBC / HPF: NONE SEEN RBC/hpf (ref 0–5)

## 2016-02-18 LAB — I-STAT CG4 LACTIC ACID, ED: LACTIC ACID, VENOUS: 2.79 mmol/L — AB (ref 0.5–1.9)

## 2016-02-18 MED ORDER — SODIUM CHLORIDE 0.9 % IV BOLUS (SEPSIS)
1000.0000 mL | Freq: Once | INTRAVENOUS | Status: AC
  Administered 2016-02-18: 1000 mL via INTRAVENOUS

## 2016-02-18 MED ORDER — INSULIN ASPART 100 UNIT/ML ~~LOC~~ SOLN
10.0000 [IU] | Freq: Once | SUBCUTANEOUS | Status: AC
  Administered 2016-02-18: 10 [IU] via INTRAVENOUS
  Filled 2016-02-18: qty 1

## 2016-02-18 NOTE — ED Triage Notes (Signed)
Pt states he checked his cbg at home and got a reading "high"(>500), states he only takes Metformin 1000mg  BID, other symptoms include feeling dizzy and mildly sick/unwell.
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
## 2016-02-29 ENCOUNTER — Encounter (INDEPENDENT_AMBULATORY_CARE_PROVIDER_SITE_OTHER): Payer: Managed Care, Other (non HMO) | Admitting: Ophthalmology

## 2016-02-29 DIAGNOSIS — H35033 Hypertensive retinopathy, bilateral: Secondary | ICD-10-CM

## 2016-02-29 DIAGNOSIS — I10 Essential (primary) hypertension: Secondary | ICD-10-CM | POA: Diagnosis not present

## 2016-02-29 DIAGNOSIS — H59033 Cystoid macular edema following cataract surgery, bilateral: Secondary | ICD-10-CM

## 2016-02-29 DIAGNOSIS — H43813 Vitreous degeneration, bilateral: Secondary | ICD-10-CM

## 2016-03-08 ENCOUNTER — Emergency Department (HOSPITAL_COMMUNITY)
Admission: EM | Admit: 2016-03-08 | Discharge: 2016-03-09 | Disposition: A | Discharge status: Home / Self Care | Payer: Managed Care, Other (non HMO) | Attending: Emergency Medicine | Admitting: Emergency Medicine

## 2016-03-08 ENCOUNTER — Encounter (HOSPITAL_COMMUNITY): Payer: Self-pay

## 2016-03-08 DIAGNOSIS — Z79899 Other long term (current) drug therapy: Secondary | ICD-10-CM | POA: Diagnosis not present

## 2016-03-08 DIAGNOSIS — I110 Hypertensive heart disease with heart failure: Secondary | ICD-10-CM | POA: Insufficient documentation

## 2016-03-08 DIAGNOSIS — I252 Old myocardial infarction: Secondary | ICD-10-CM | POA: Insufficient documentation

## 2016-03-08 DIAGNOSIS — Z7984 Long term (current) use of oral hypoglycemic drugs: Secondary | ICD-10-CM | POA: Insufficient documentation

## 2016-03-08 DIAGNOSIS — I509 Heart failure, unspecified: Secondary | ICD-10-CM | POA: Diagnosis not present

## 2016-03-08 DIAGNOSIS — I2510 Atherosclerotic heart disease of native coronary artery without angina pectoris: Secondary | ICD-10-CM | POA: Diagnosis not present

## 2016-03-08 DIAGNOSIS — R739 Hyperglycemia, unspecified: Secondary | ICD-10-CM

## 2016-03-08 DIAGNOSIS — I10 Essential (primary) hypertension: Secondary | ICD-10-CM

## 2016-03-08 DIAGNOSIS — Z96653 Presence of artificial knee joint, bilateral: Secondary | ICD-10-CM | POA: Diagnosis not present

## 2016-03-08 DIAGNOSIS — Z87891 Personal history of nicotine dependence: Secondary | ICD-10-CM | POA: Insufficient documentation

## 2016-03-08 DIAGNOSIS — Z955 Presence of coronary angioplasty implant and graft: Secondary | ICD-10-CM | POA: Insufficient documentation

## 2016-03-08 DIAGNOSIS — E1165 Type 2 diabetes mellitus with hyperglycemia: Secondary | ICD-10-CM | POA: Diagnosis not present

## 2016-03-08 LAB — URINALYSIS, ROUTINE W REFLEX MICROSCOPIC
Bilirubin Urine: NEGATIVE
Glucose, UA: >1000 mg/dL — AB
KETONES UR: NEGATIVE mg/dL
NITRITE: NEGATIVE
PH: 5.5 (ref 5.0–8.0)
Protein, ur: NEGATIVE mg/dL
SPECIFIC GRAVITY, URINE: 1.035 — AB (ref 1.005–1.030)

## 2016-03-08 LAB — CBC
HEMATOCRIT: 45.4 % (ref 39.0–52.0)
HEMOGLOBIN: 15.5 g/dL (ref 13.0–17.0)
MCH: 29.5 pg (ref 26.0–34.0)
MCHC: 34.1 g/dL (ref 30.0–36.0)
MCV: 86.5 fL (ref 78.0–100.0)
Platelets: 223 10*3/uL (ref 150–400)
RBC: 5.25 MIL/uL (ref 4.22–5.81)
RDW: 13.7 % (ref 11.5–15.5)
WBC: 7.3 10*3/uL (ref 4.0–10.5)

## 2016-03-08 LAB — URINE MICROSCOPIC-ADD ON

## 2016-03-08 LAB — CBG MONITORING, ED: Glucose-Capillary: 547 mg/dL (ref 65–99)

## 2016-03-08 LAB — BASIC METABOLIC PANEL
ANION GAP: 9 (ref 5–15)
BUN: 13 mg/dL (ref 6–20)
CO2: 23 mmol/L (ref 22–32)
Calcium: 9.3 mg/dL (ref 8.9–10.3)
Chloride: 101 mmol/L (ref 101–111)
Creatinine, Ser: 1.32 mg/dL — ABNORMAL HIGH (ref 0.61–1.24)
GFR calc Af Amer: >60 mL/min (ref >60)
GFR, EST NON AFRICAN AMERICAN: 55 mL/min — AB (ref >60)
Glucose, Bld: 464 mg/dL — ABNORMAL HIGH (ref 65–99)
POTASSIUM: 4.3 mmol/L (ref 3.5–5.1)
SODIUM: 133 mmol/L — AB (ref 135–145)

## 2016-03-08 MED ORDER — SODIUM CHLORIDE 0.9 % IV BOLUS (SEPSIS)
2000.0000 mL | Freq: Once | INTRAVENOUS | Status: AC
  Administered 2016-03-08: 2000 mL via INTRAVENOUS

## 2016-03-08 MED ORDER — INSULIN ASPART 100 UNIT/ML IV SOLN
10.0000 [IU] | Freq: Once | INTRAVENOUS | Status: AC
  Administered 2016-03-08: 10 [IU] via INTRAVENOUS
  Filled 2016-03-08: qty 0.1

## 2016-03-08 NOTE — ED Triage Notes (Signed)
PT HERE FOR HIGH BLOOD SUGAR. PT STS HIS HOME METER WAS OVER 600. PT STS HE TAKES METFORMIN TO BS CONTROL. PT STS HE FEELS "JITTERY", BUT DENIES URINARY FREQUENCY.
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
## 2016-03-08 NOTE — ED Notes (Signed)
MD at bedside. 

## 2016-03-09 LAB — CBG MONITORING, ED
GLUCOSE-CAPILLARY: 196 mg/dL — AB (ref 65–99)
GLUCOSE-CAPILLARY: 246 mg/dL — AB (ref 65–99)

## 2016-03-22 ENCOUNTER — Other Ambulatory Visit (HOSPITAL_BASED_OUTPATIENT_CLINIC_OR_DEPARTMENT_OTHER): Payer: Self-pay

## 2016-03-22 DIAGNOSIS — G4710 Hypersomnia, unspecified: Secondary | ICD-10-CM

## 2016-03-22 DIAGNOSIS — R5383 Other fatigue: Secondary | ICD-10-CM

## 2016-04-01 ENCOUNTER — Encounter (HOSPITAL_BASED_OUTPATIENT_CLINIC_OR_DEPARTMENT_OTHER): Payer: Managed Care, Other (non HMO)

## 2016-04-01 ENCOUNTER — Ambulatory Visit (HOSPITAL_BASED_OUTPATIENT_CLINIC_OR_DEPARTMENT_OTHER): Payer: Managed Care, Other (non HMO) | Attending: Anesthesiology | Admitting: Internal Medicine

## 2016-04-01 VITALS — Ht 69.0 in | Wt 240.0 lb

## 2016-04-01 DIAGNOSIS — G4710 Hypersomnia, unspecified: Secondary | ICD-10-CM

## 2016-04-01 DIAGNOSIS — R5383 Other fatigue: Secondary | ICD-10-CM

## 2016-04-01 DIAGNOSIS — G4709 Other insomnia: Secondary | ICD-10-CM | POA: Insufficient documentation

## 2016-04-07 NOTE — Procedures (Signed)
  Patient Name: James Maxwell, James Maxwell Date: 04/01/2016 Gender: Male D.O.B: [birth year] Age (years): 56 Referring Provider: Ivy Lynn Dakwa Height (inches): 69 Interpreting Physician: Baird Lyons MD, ABSM Weight (lbs): 245 RPSGT: Madelon Lips BMI: 36 MRN: [id number] Neck Size: 18.00 CLINICAL INFORMATION Sleep Study Type: NPSG Indication for sleep study: Excessive Daytime Sleepiness, Fatigue Epworth Sleepiness Score: 3  SLEEP STUDY TECHNIQUE As per the AASM Manual for the Scoring of Sleep and Associated Events v2.3 (April 2016) with a hypopnea requiring 4% desaturations. The channels recorded and monitored were frontal, central and occipital EEG, electrooculogram (EOG), submentalis EMG (chin), nasal and oral airflow, thoracic and abdominal wall motion, anterior tibialis EMG, snore microphone, electrocardiogram, and pulse oximetry.  MEDICATIONS Medications self-administered by patient taken the night of the study : none reported  SLEEP ARCHITECTURE The study was initiated at 10:25:30 PM and ended at 4:27:32 AM. Sleep onset time was 2.3 minutes and the sleep efficiency was 90.6%. The total sleep time was 328.0 minutes. Stage REM latency was 76.0 minutes. The patient spent 14.63% of the night in stage N1 sleep, 64.79% in stage N2 sleep, 0.00% in stage N3 and 20.58% in REM. Alpha intrusion was absent. Supine sleep was 71.00%.  RESPIRATORY PARAMETERS The overall apnea/hypopnea index (AHI) was 15.7 per hour. There were 8 total apneas, including 8 obstructive, 0 central and 0 mixed apneas. There were 78 hypopneas and 31 RERAs. The AHI during Stage REM sleep was 56.0 per hour. AHI while supine was 22.2 per hour. The mean oxygen saturation was 94.09%. The minimum SpO2 during sleep was 82.00%. Loud snoring was noted during this study.  CARDIAC DATA The 2 lead EKG demonstrated sinus rhythm. The mean heart rate was 62.45 beats per minute. Other EKG findings include: PVCs.  LEG MOVEMENT  DATA The total PLMS were 104 with a resulting PLMS index of 19.02. Associated arousal with leg movement index was 2.0 .  IMPRESSIONS - Moderate obstructive sleep apnea occurred during this study (AHI = 15.7/h). - Insufficient early events to meet protocol requirements for split CPAP titration. - No significant central sleep apnea occurred during this study (CAI = 0.0/h). - Mild oxygen desaturation was noted during this study (Min O2 = 82.00%). - The patient snored with Loud snoring volume. - EKG findings include PVCs. - Mild periodic limb movements of sleep occurred during the study. No significant associated arousals.  DIAGNOSIS - Obstructive Sleep Apnea (327.23 [G47.33 ICD-10])  RECOMMENDATIONS - Therapeutic CPAP titration to determine optimal pressure required to alleviate sleep disordered breathing. - Positional therapy avoiding supine position during sleep. - Avoid alcohol, sedatives and other CNS depressants that may worsen sleep apnea and disrupt normal sleep architecture. - Sleep hygiene should be reviewed to assess factors that may improve sleep quality. - Weight management and regular exercise should be initiated or continued if appropriate.  [Electronically signed] 04/07/2016 11:29 AM  Baird Lyons MD, ABSM Diplomate, American Board of Sleep Medicine   NPI: [id number]  [place], American Board of Sleep Medicine  ELECTRONICALLY SIGNED ON:  04/07/2016, 11:25 AM [place] PH: [phone]   FX: [phone] [place]

## 2016-04-12 ENCOUNTER — Observation Stay
Admission: EM | Admit: 2016-04-12 | Discharge: 2016-04-13 | Disposition: A | Discharge status: Home / Self Care | Payer: Managed Care, Other (non HMO) | Attending: Internal Medicine | Admitting: Internal Medicine

## 2016-04-12 ENCOUNTER — Emergency Department: Payer: Managed Care, Other (non HMO)

## 2016-04-12 ENCOUNTER — Encounter: Payer: Self-pay | Admitting: Emergency Medicine

## 2016-04-12 DIAGNOSIS — R079 Chest pain, unspecified: Secondary | ICD-10-CM | POA: Diagnosis not present

## 2016-04-12 DIAGNOSIS — I110 Hypertensive heart disease with heart failure: Secondary | ICD-10-CM | POA: Insufficient documentation

## 2016-04-12 DIAGNOSIS — R7989 Other specified abnormal findings of blood chemistry: Secondary | ICD-10-CM

## 2016-04-12 DIAGNOSIS — Z7902 Long term (current) use of antithrombotics/antiplatelets: Secondary | ICD-10-CM | POA: Insufficient documentation

## 2016-04-12 DIAGNOSIS — E785 Hyperlipidemia, unspecified: Secondary | ICD-10-CM | POA: Diagnosis present

## 2016-04-12 DIAGNOSIS — Z8249 Family history of ischemic heart disease and other diseases of the circulatory system: Secondary | ICD-10-CM | POA: Diagnosis not present

## 2016-04-12 DIAGNOSIS — Z809 Family history of malignant neoplasm, unspecified: Secondary | ICD-10-CM | POA: Diagnosis not present

## 2016-04-12 DIAGNOSIS — E119 Type 2 diabetes mellitus without complications: Secondary | ICD-10-CM

## 2016-04-12 DIAGNOSIS — Z833 Family history of diabetes mellitus: Secondary | ICD-10-CM | POA: Insufficient documentation

## 2016-04-12 DIAGNOSIS — Z955 Presence of coronary angioplasty implant and graft: Secondary | ICD-10-CM | POA: Diagnosis not present

## 2016-04-12 DIAGNOSIS — R778 Other specified abnormalities of plasma proteins: Secondary | ICD-10-CM

## 2016-04-12 DIAGNOSIS — Z87891 Personal history of nicotine dependence: Secondary | ICD-10-CM | POA: Insufficient documentation

## 2016-04-12 DIAGNOSIS — I252 Old myocardial infarction: Secondary | ICD-10-CM | POA: Diagnosis not present

## 2016-04-12 DIAGNOSIS — I2510 Atherosclerotic heart disease of native coronary artery without angina pectoris: Secondary | ICD-10-CM | POA: Diagnosis present

## 2016-04-12 DIAGNOSIS — M109 Gout, unspecified: Secondary | ICD-10-CM | POA: Diagnosis not present

## 2016-04-12 DIAGNOSIS — E1165 Type 2 diabetes mellitus with hyperglycemia: Secondary | ICD-10-CM | POA: Insufficient documentation

## 2016-04-12 DIAGNOSIS — I471 Supraventricular tachycardia, unspecified: Secondary | ICD-10-CM

## 2016-04-12 DIAGNOSIS — Z7982 Long term (current) use of aspirin: Secondary | ICD-10-CM | POA: Insufficient documentation

## 2016-04-12 DIAGNOSIS — Z888 Allergy status to other drugs, medicaments and biological substances status: Secondary | ICD-10-CM | POA: Insufficient documentation

## 2016-04-12 DIAGNOSIS — Z96653 Presence of artificial knee joint, bilateral: Secondary | ICD-10-CM | POA: Insufficient documentation

## 2016-04-12 DIAGNOSIS — M1990 Unspecified osteoarthritis, unspecified site: Secondary | ICD-10-CM | POA: Diagnosis not present

## 2016-04-12 DIAGNOSIS — G8929 Other chronic pain: Secondary | ICD-10-CM | POA: Insufficient documentation

## 2016-04-12 DIAGNOSIS — Z794 Long term (current) use of insulin: Secondary | ICD-10-CM | POA: Diagnosis not present

## 2016-04-12 DIAGNOSIS — K219 Gastro-esophageal reflux disease without esophagitis: Secondary | ICD-10-CM | POA: Diagnosis present

## 2016-04-12 DIAGNOSIS — I5032 Chronic diastolic (congestive) heart failure: Secondary | ICD-10-CM | POA: Insufficient documentation

## 2016-04-12 DIAGNOSIS — I10 Essential (primary) hypertension: Secondary | ICD-10-CM | POA: Diagnosis present

## 2016-04-12 HISTORY — DX: Chronic diastolic (congestive) heart failure: I50.32

## 2016-04-12 HISTORY — DX: Hyperlipidemia, unspecified: E78.5

## 2016-04-12 LAB — BASIC METABOLIC PANEL
Anion gap: 10 (ref 5–15)
BUN: 18 mg/dL (ref 6–20)
CHLORIDE: 105 mmol/L (ref 101–111)
CO2: 21 mmol/L — AB (ref 22–32)
CREATININE: 1.29 mg/dL — AB (ref 0.61–1.24)
Calcium: 9 mg/dL (ref 8.9–10.3)
GFR calc Af Amer: >60 mL/min (ref >60)
GFR calc non Af Amer: 57 mL/min — ABNORMAL LOW (ref >60)
Glucose, Bld: 370 mg/dL — ABNORMAL HIGH (ref 65–99)
Potassium: 3.9 mmol/L (ref 3.5–5.1)
Sodium: 136 mmol/L (ref 135–145)

## 2016-04-12 LAB — CBC
HCT: 47.7 % (ref 40.0–52.0)
Hemoglobin: 16.1 g/dL (ref 13.0–18.0)
MCH: 30 pg (ref 26.0–34.0)
MCHC: 33.8 g/dL (ref 32.0–36.0)
MCV: 88.8 fL (ref 80.0–100.0)
PLATELETS: 215 10*3/uL (ref 150–440)
RBC: 5.38 MIL/uL (ref 4.40–5.90)
RDW: 13.6 % (ref 11.5–14.5)
WBC: 8.3 10*3/uL (ref 3.8–10.6)

## 2016-04-12 LAB — MAGNESIUM: Magnesium: 1.6 mg/dL — ABNORMAL LOW (ref 1.7–2.4)

## 2016-04-12 LAB — TSH: TSH: 2.262 u[IU]/mL (ref 0.350–4.500)

## 2016-04-12 LAB — TROPONIN I: Troponin I: 0.03 ng/mL (ref <0.03)

## 2016-04-12 MED ORDER — ADENOSINE 12 MG/4ML IV SOLN
INTRAVENOUS | Status: AC
  Filled 2016-04-12: qty 8

## 2016-04-12 MED ORDER — ADENOSINE 6 MG/2ML IV SOLN
6.0000 mg | Freq: Once | INTRAVENOUS | Status: AC
  Administered 2016-04-12: 6 mg via INTRAVENOUS

## 2016-04-12 MED ORDER — ASPIRIN 81 MG PO CHEW
324.0000 mg | CHEWABLE_TABLET | Freq: Once | ORAL | Status: AC
  Administered 2016-04-12: 324 mg via ORAL
  Filled 2016-04-12: qty 4

## 2016-04-12 MED ORDER — METOPROLOL TARTRATE 5 MG/5ML IV SOLN
INTRAVENOUS | Status: AC
  Filled 2016-04-12: qty 5

## 2016-04-12 MED ORDER — ADENOSINE 6 MG/2ML IV SOLN
INTRAVENOUS | Status: AC
  Filled 2016-04-12: qty 2

## 2016-04-12 MED ORDER — METOPROLOL TARTRATE 5 MG/5ML IV SOLN: 5.0000 mg | Freq: Once | INTRAVENOUS | Status: DC

## 2016-04-12 MED ORDER — METOPROLOL TARTRATE 5 MG/5ML IV SOLN
5.0000 mg | Freq: Once | INTRAVENOUS | Status: AC
  Administered 2016-04-12: 5 mg via INTRAVENOUS
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
## 2016-04-12 NOTE — ED Triage Notes (Signed)
Pt presents to ED with c/o shortness of breath and chest pain x 3 hours ago. Pt speaking in complete sentences, no increased work in breathing noted, skin warm and dry.

## 2016-04-12 NOTE — ED Notes (Signed)
Dr. Burlene Arnt in triage room to see patient.
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
## 2016-04-13 ENCOUNTER — Other Ambulatory Visit: Payer: Self-pay | Admitting: Nurse Practitioner

## 2016-04-13 ENCOUNTER — Observation Stay (HOSPITAL_BASED_OUTPATIENT_CLINIC_OR_DEPARTMENT_OTHER): Payer: Managed Care, Other (non HMO)

## 2016-04-13 ENCOUNTER — Encounter: Payer: Self-pay | Admitting: Nurse Practitioner

## 2016-04-13 DIAGNOSIS — R079 Chest pain, unspecified: Secondary | ICD-10-CM

## 2016-04-13 DIAGNOSIS — R7989 Other specified abnormal findings of blood chemistry: Secondary | ICD-10-CM

## 2016-04-13 DIAGNOSIS — R0789 Other chest pain: Secondary | ICD-10-CM

## 2016-04-13 DIAGNOSIS — R778 Other specified abnormalities of plasma proteins: Secondary | ICD-10-CM

## 2016-04-13 DIAGNOSIS — R748 Abnormal levels of other serum enzymes: Secondary | ICD-10-CM

## 2016-04-13 DIAGNOSIS — I471 Supraventricular tachycardia, unspecified: Secondary | ICD-10-CM

## 2016-04-13 LAB — GLUCOSE, CAPILLARY
GLUCOSE-CAPILLARY: 164 mg/dL — AB (ref 65–99)
Glucose-Capillary: 243 mg/dL — ABNORMAL HIGH (ref 65–99)
Glucose-Capillary: 119 mg/dL — ABNORMAL HIGH (ref 65–99)
Glucose-Capillary: 127 mg/dL — ABNORMAL HIGH (ref 65–99)

## 2016-04-13 LAB — NM MYOCAR MULTI W/SPECT W/WALL MOTION / EF
CHL CUP MPHR: 155 {beats}/min
CHL CUP NUCLEAR SDS: 0
CHL CUP NUCLEAR SRS: 9
CHL CUP NUCLEAR SSS: 1
CSEPPHR: 88 {beats}/min
Estimated workload: 1 METS
Exercise duration (min): 0 min
Exercise duration (sec): 0 s
LVDIAVOL: 147 mL (ref 62–150)
LVSYSVOL: 88 mL
Percent HR: 56 %
Rest HR: 69 {beats}/min
TID: 1.17

## 2016-04-13 LAB — TROPONIN I
TROPONIN I: 0.08 ng/mL — AB (ref <0.03)
Troponin I: 0.06 ng/mL (ref <0.03)
Troponin I: 0.08 ng/mL (ref <0.03)

## 2016-04-13 LAB — LIPID PANEL
Cholesterol: 193 mg/dL (ref 0–200)
HDL: 48 mg/dL (ref >40)
LDL CALC: 124 mg/dL — AB (ref 0–99)
Total CHOL/HDL Ratio: 4 RATIO
Triglycerides: 106 mg/dL (ref <150)
VLDL: 21 mg/dL (ref 0–40)

## 2016-04-13 MED ORDER — NITROGLYCERIN 0.4 MG SL SUBL: 0.4000 mg | SUBLINGUAL_TABLET | SUBLINGUAL | Status: DC | PRN

## 2016-04-13 MED ORDER — ALPRAZOLAM 0.25 MG PO TABS: 0.2500 mg | ORAL_TABLET | Freq: Two times a day (BID) | ORAL | Status: DC | PRN

## 2016-04-13 MED ORDER — TECHNETIUM TC 99M TETROFOSMIN IV KIT
33.0000 | PACK | Freq: Once | INTRAVENOUS | Status: AC | PRN
  Administered 2016-04-13: 32.14 via INTRAVENOUS

## 2016-04-13 MED ORDER — INSULIN ASPART 100 UNIT/ML ~~LOC~~ SOLN: 0.0000 [IU] | Freq: Every day | SUBCUTANEOUS | Status: DC

## 2016-04-13 MED ORDER — ENOXAPARIN SODIUM 40 MG/0.4ML ~~LOC~~ SOLN
40.0000 mg | Freq: Every day | SUBCUTANEOUS | Status: DC
  Administered 2016-04-13: 40 mg via SUBCUTANEOUS
  Filled 2016-04-13: qty 0.4

## 2016-04-13 MED ORDER — ASPIRIN EC 81 MG PO TBEC
81.0000 mg | DELAYED_RELEASE_TABLET | Freq: Every day | ORAL | Status: DC
Start: 2016-04-13 — End: 2016-04-13
  Administered 2016-04-13: 81 mg via ORAL
  Filled 2016-04-13: qty 1

## 2016-04-13 MED ORDER — TECHNETIUM TC 99M TETROFOSMIN IV KIT
13.0000 | PACK | Freq: Once | INTRAVENOUS | Status: AC | PRN
  Administered 2016-04-13: 13.04 via INTRAVENOUS

## 2016-04-13 MED ORDER — METOPROLOL TARTRATE 25 MG PO TABS: 25.0000 mg | ORAL_TABLET | Freq: Two times a day (BID) | ORAL | 0 refills | Status: DC

## 2016-04-13 MED ORDER — PANTOPRAZOLE SODIUM 40 MG PO TBEC
40.0000 mg | DELAYED_RELEASE_TABLET | Freq: Every day | ORAL | Status: DC
  Administered 2016-04-13: 40 mg via ORAL
  Filled 2016-04-13: qty 1

## 2016-04-13 MED ORDER — METOPROLOL TARTRATE 25 MG PO TABS
25.0000 mg | ORAL_TABLET | Freq: Two times a day (BID) | ORAL | Status: DC
  Administered 2016-04-13: 25 mg via ORAL
  Filled 2016-04-13: qty 1

## 2016-04-13 MED ORDER — AMLODIPINE-ATORVASTATIN 10-80 MG PO TABS: 1.0000 | ORAL_TABLET | Freq: Every morning | ORAL | Status: DC

## 2016-04-13 MED ORDER — SODIUM CHLORIDE 0.9 % IV SOLN
INTRAVENOUS | Status: DC
  Administered 2016-04-13 (×2): via INTRAVENOUS

## 2016-04-13 MED ORDER — ATORVASTATIN CALCIUM 20 MG PO TABS
80.0000 mg | ORAL_TABLET | Freq: Every day | ORAL | Status: DC
  Administered 2016-04-13: 80 mg via ORAL
  Filled 2016-04-13: qty 4

## 2016-04-13 MED ORDER — ONDANSETRON HCL 4 MG/2ML IJ SOLN: 4.0000 mg | Freq: Four times a day (QID) | INTRAMUSCULAR | Status: DC | PRN

## 2016-04-13 MED ORDER — EZETIMIBE 10 MG PO TABS
10.0000 mg | ORAL_TABLET | Freq: Every day | ORAL | Status: DC
  Administered 2016-04-13: 10 mg via ORAL
  Filled 2016-04-13: qty 1

## 2016-04-13 MED ORDER — OXYCODONE HCL 5 MG PO TABS
15.0000 mg | ORAL_TABLET | Freq: Four times a day (QID) | ORAL | Status: DC
  Administered 2016-04-13 (×2): 15 mg via ORAL
  Filled 2016-04-13 (×2): qty 3

## 2016-04-13 MED ORDER — ALBUTEROL SULFATE (2.5 MG/3ML) 0.083% IN NEBU: 3.0000 mL | INHALATION_SOLUTION | RESPIRATORY_TRACT | Status: DC | PRN

## 2016-04-13 MED ORDER — ZOLPIDEM TARTRATE 5 MG PO TABS: 5.0000 mg | ORAL_TABLET | Freq: Every evening | ORAL | Status: DC | PRN

## 2016-04-13 MED ORDER — INSULIN ASPART 100 UNIT/ML ~~LOC~~ SOLN: 0.0000 [IU] | Freq: Three times a day (TID) | SUBCUTANEOUS | Status: DC

## 2016-04-13 MED ORDER — REGADENOSON 0.4 MG/5ML IV SOLN
0.4000 mg | Freq: Once | INTRAVENOUS | Status: AC
  Administered 2016-04-13: 0.4 mg via INTRAVENOUS
  Filled 2016-04-13: qty 5

## 2016-04-13 MED ORDER — INSULIN GLARGINE 100 UNIT/ML ~~LOC~~ SOLN
21.0000 [IU] | Freq: Every day | SUBCUTANEOUS | Status: DC
  Filled 2016-04-13 (×2): qty 0.21

## 2016-04-13 MED ORDER — ASPIRIN EC 325 MG PO TBEC: 325.0000 mg | DELAYED_RELEASE_TABLET | Freq: Every day | ORAL | Status: DC

## 2016-04-13 MED ORDER — ACETAMINOPHEN 325 MG PO TABS: 650.0000 mg | ORAL_TABLET | ORAL | Status: DC | PRN

## 2016-04-13 MED ORDER — AMLODIPINE BESYLATE 10 MG PO TABS: 10.0000 mg | ORAL_TABLET | Freq: Every day | ORAL | 0 refills | Status: DC

## 2016-04-13 MED ORDER — ADENOSINE 6 MG/2ML IV SOLN
6.0000 mg | Freq: Once | INTRAVENOUS | Status: DC | PRN
Start: 2016-04-13 — End: 2016-04-13
  Filled 2016-04-13: qty 2

## 2016-04-13 MED ORDER — INSULIN ASPART 100 UNIT/ML ~~LOC~~ SOLN
0.0000 [IU] | SUBCUTANEOUS | Status: DC
  Administered 2016-04-13: 3 [IU] via SUBCUTANEOUS
  Administered 2016-04-13: 7 [IU] via SUBCUTANEOUS
  Filled 2016-04-13: qty 3
  Filled 2016-04-13: qty 7

## 2016-04-13 MED ORDER — CLOPIDOGREL BISULFATE 75 MG PO TABS
75.0000 mg | ORAL_TABLET | Freq: Every day | ORAL | Status: DC
  Administered 2016-04-13: 75 mg via ORAL
  Filled 2016-04-13: qty 1

## 2016-04-13 MED ORDER — MORPHINE SULFATE (PF) 4 MG/ML IV SOLN: 2.0000 mg | INTRAVENOUS | Status: DC | PRN

## 2016-04-13 MED ORDER — AMLODIPINE BESYLATE 10 MG PO TABS
10.0000 mg | ORAL_TABLET | Freq: Every day | ORAL | Status: DC
  Administered 2016-04-13: 10 mg via ORAL
  Filled 2016-04-13: qty 1

## 2016-04-13 MED ORDER — EZETIMIBE 10 MG PO TABS: 10.0000 mg | ORAL_TABLET | Freq: Every day | ORAL | 0 refills | Status: DC

## 2016-04-13 MED ORDER — VITAMIN D 1000 UNITS PO TABS
1000.0000 [IU] | ORAL_TABLET | Freq: Every day | ORAL | Status: DC
  Administered 2016-04-13: 1000 [IU] via ORAL
  Filled 2016-04-13: qty 1

## 2016-04-13 MED ORDER — MAGNESIUM CHLORIDE 64 MG PO TBEC
1.0000 | DELAYED_RELEASE_TABLET | Freq: Every day | ORAL | Status: DC
  Administered 2016-04-13: 64 mg via ORAL
  Filled 2016-04-13: qty 1

## 2016-04-13 MED ORDER — GI COCKTAIL ~~LOC~~: 30.0000 mL | Freq: Four times a day (QID) | ORAL | Status: DC | PRN

## 2016-04-13 MED ORDER — LISINOPRIL 20 MG PO TABS
20.0000 mg | ORAL_TABLET | Freq: Every day | ORAL | Status: DC
  Administered 2016-04-13: 20 mg via ORAL
  Filled 2016-04-13: qty 1

## 2016-04-13 NOTE — Progress Notes (Signed)
Pt arrived from ER via stretcher. A&O, skin intact. No complaints of pain at this time. Telemetry monitor box applied and called to CCMD 4055. 20 Right hand infusing NS @ 75, 16 left AC saline locked

## 2016-04-13 NOTE — Discharge Instructions (Signed)
***  PLEASE REMEMBER TO BRING ALL OF YOUR MEDICATIONS TO EACH OF YOUR FOLLOW-UP OFFICE VISITS.  

## 2016-04-13 NOTE — Discharge Summary (Signed)
[place] [facility] [place] NAME: James Maxwell    MR#:  [id number]  DATE OF BIRTH:  1950/10/22  DATE OF ADMISSION:  04/12/2016 ADMITTING PHYSICIAN: Harvie Bridge, DO  DATE OF DISCHARGE: 04/13/16  PRIMARY CARE PHYSICIAN: Maximino Greenland, MD    ADMISSION DIAGNOSIS:  SVT (supraventricular tachycardia) (HCC) [I47.1] Chest pain [R07.9] Chest pain, unspecified type [R07.9]  DISCHARGE DIAGNOSIS:  Principal Problem:   SVT (supraventricular tachycardia) (HCC) Active Problems:   Hypertension   Diabetes mellitus without complication (HCC)   Coronary atherosclerosis of native coronary artery   GERD (gastroesophageal reflux disease)   Hyperlipidemia   Chest pain, rule out acute myocardial infarction   Elevated troponin   SECONDARY DIAGNOSIS:   Past Medical History:  Diagnosis Date  . Arthritis   . Chronic diastolic CHF (congestive heart failure) ([place])    a. 04/2014 Echo: EF 55%, Gr1 DD.  [name] Coronary artery disease    a. 2006 s/p PCI RCA;  b. 2012 MV: no ischemia.  . Diabetes mellitus without complication ([place])   . GERD (gastroesophageal reflux disease)   . History of gout   . Hyperlipidemia   . Hypertension   . MI, old [place]:  James Maxwell  is a 65 y.o. male admitted 04/12/2016 with chief complaint Chest Pain and Shortness of Breath . Please see H&P performed by Harvie Bridge, DO for further information. Patient presented with the above symptoms, found to be in SVT. Converted after adenosine. Noted elevated cardiac enzymes - cardiology consulted and assisted with management - underwent stress testing - normal  DISCHARGE CONDITIONS:   stable  CONSULTS OBTAINED:  Treatment Team:  Wellington Hampshire, MD  DRUG ALLERGIES:   Allergies  Allergen Reactions  . Lipitor [Atorvastatin]     myalgias    DISCHARGE MEDICATIONS:   Current Discharge Medication List    START taking these medications   Details    amLODipine (NORVASC) 10 MG tablet Take 1 tablet (10 mg total) by mouth daily. Qty: 30 tablet, Refills: 0    ezetimibe (ZETIA) 10 MG tablet Take 1 tablet (10 mg total) by mouth daily. Qty: 30 tablet, Refills: 0    metoprolol tartrate (LOPRESSOR) 25 MG tablet Take 1 tablet (25 mg total) by mouth 2 (two) times daily. Qty: 60 tablet, Refills: 0      CONTINUE these medications which have NOT CHANGED   Details  albuterol (PROVENTIL HFA;VENTOLIN HFA) 108 (90 Base) MCG/ACT inhaler Inhale 1-2 puffs into the lungs every 4 (four) hours as needed for wheezing or shortness of breath. Qty: 1 Inhaler, Refills: 0    aspirin EC 81 MG tablet Take 81 mg by mouth daily.    Cholecalciferol (VITAMIN D PO) Take 1 tablet by mouth daily.    CIALIS 20 MG tablet Take 20 mg by mouth daily as needed for erectile dysfunction.  Refills: 0    clopidogrel (PLAVIX) 75 MG tablet Take 75 mg by mouth daily.    insulin glargine (LANTUS) 100 UNIT/ML injection Inject 21 Units into the skin at bedtime.    lisinopril (PRINIVIL,ZESTRIL) 20 MG tablet Take 20 mg by mouth daily.    naproxen (NAPROSYN) 500 MG tablet Take 1 tablet (500 mg total) by mouth 2 (two) times daily. Qty: 10 tablet, Refills: 0    nitroGLYCERIN (NITROSTAT) 0.4 MG SL tablet Place 1 tablet (0.4 mg total) under the tongue every 5 (five) minutes as needed for chest pain. Qty: 25  tablet, Refills: 5    oxyCODONE (ROXICODONE) 15 MG immediate release tablet Take 15 mg by mouth 4 (four) times daily. Refills: 0    pantoprazole (PROTONIX) 40 MG tablet Take 40 mg by mouth daily.      STOP taking these medications     amLODipine-atorvastatin (CADUET) 10-80 MG per tablet          DISCHARGE INSTRUCTIONS:    DIET:  Cardiac diet  DISCHARGE CONDITION:  Stable  ACTIVITY:  Activity as tolerated  OXYGEN:  Home Oxygen: No.   Oxygen Delivery: room air  DISCHARGE LOCATION:  home   If you experience worsening of your admission symptoms, develop  shortness of breath, life threatening emergency, suicidal or homicidal thoughts you must seek medical attention immediately by calling 911 or calling your MD immediately  if symptoms less severe.  You Must read complete instructions/literature along with all the possible adverse reactions/side effects for all the Medicines you take and that have been prescribed to you. Take any new Medicines after you have completely understood and accpet all the possible adverse reactions/side effects.   Please note  You were cared for by a hospitalist during your hospital stay. If you have any questions about your discharge medications or the care you received while you were in the hospital after you are discharged, you can call the unit and asked to speak with the hospitalist on call if the hospitalist that took care of you is not available. Once you are discharged, your primary care physician will handle any further medical issues. Please note that NO REFILLS for any discharge medications will be authorized once you are discharged, as it is imperative that you return to your primary care physician (or establish a relationship with a primary care physician if you do not have one) for your aftercare needs so that they can reassess your need for medications and monitor your lab values.    On the day of Discharge:   VITAL SIGNS:  Blood pressure (!) 165/93, pulse 66, temperature 97.5 F (36.4 C), temperature source Oral, resp. rate 18, height 5\' 9"  (1.753 m), weight 111.1 kg (245 lb), SpO2 98 %.  I/O:   Intake/Output Summary (Last 24 hours) at 04/13/16 1249 Last data filed at 04/13/16 0958  Gross per 24 hour  Intake              575 ml  Output             1150 ml  Net             -575 ml    PHYSICAL EXAMINATION:  GENERAL:  65 y.o.-year-old patient lying in the bed with no acute distress.  EYES: Pupils equal, round, reactive to light and accommodation. No scleral icterus. Extraocular muscles intact.    HEENT: Head atraumatic, normocephalic. Oropharynx and nasopharynx clear.  NECK:  Supple, no jugular venous distention. No thyroid enlargement, no tenderness.  LUNGS: Normal breath sounds bilaterally, no wheezing, rales,rhonchi or crepitation. No use of accessory muscles of respiration.  CARDIOVASCULAR: S1, S2 normal. No murmurs, rubs, or gallops.  ABDOMEN: Soft, non-tender, non-distended. Bowel sounds present. No organomegaly or mass.  EXTREMITIES: No pedal edema, cyanosis, or clubbing.  NEUROLOGIC: Cranial nerves II through XII are intact. Muscle strength 5/5 in all extremities. Sensation intact. Gait not checked.  PSYCHIATRIC: The patient is alert and oriented x 3.  SKIN: No obvious rash, lesion, or ulcer.   DATA REVIEW:   CBC  Recent Labs Lab 04/12/16  2210  WBC 8.3  HGB 16.1  HCT 47.7  PLT 215    Chemistries   Recent Labs Lab 04/12/16 2210  NA 136  K 3.9  CL 105  CO2 21*  GLUCOSE 370*  BUN 18  CREATININE 1.29*  CALCIUM 9.0  MG 1.6*    Cardiac Enzymes  Recent Labs Lab 04/13/16 0807  TROPONINI 0.08*    Microbiology Results  Results for orders placed or performed during the hospital encounter of 07/15/13  Surgical pcr screen     Status: None   Collection Time: 07/15/13  9:47 AM  Result Value Ref Range Status   MRSA, PCR NEGATIVE NEGATIVE Final   Staphylococcus aureus NEGATIVE NEGATIVE Final    Comment:        The Xpert SA Assay (FDA approved for NASAL specimens in patients over 60 years of age), is one component of a comprehensive surveillance program.  Test performance has been validated by [employer] for patients greater than or equal to 4 year old. It is not intended to diagnose infection nor to guide or monitor treatment.    RADIOLOGY:  Dg Chest Port 1 View  Result Date: 04/12/2016 CLINICAL DATA:  Shortness of breath with chest pain EXAM: PORTABLE CHEST 1 VIEW COMPARISON:  02/18/2016 FINDINGS: The heart size and mediastinal contours are  within normal limits. Both lungs are clear. The visualized skeletal structures are unremarkable. IMPRESSION: No active disease. Electronically Signed   By: Donavan Foil M.D.   On: 04/12/2016 23:22     Management plans discussed with the patient, family and they are in agreement.  CODE STATUS:     Code Status Orders        Start     Ordered   04/13/16 0046  Full code  Continuous     04/13/16 0046    Code Status History    Date Active Date Inactive Code Status Order ID Comments User Context   07/20/2013  7:17 PM 07/22/2013  7:14 PM Full Code [id number]  Gearlean Alf, MD Inpatient      TOTAL TIME TAKING CARE OF THIS PATIENT: 33 minutes.    Kensley Lares,  Karenann Cai.D on 04/13/2016 at 12:49 PM  Between 7am to 6pm - Pager - [phone]  After 6pm go to [email or web address] - Proofreader  [facility]  Office  [phone]  CC: Primary care physician; Maximino Greenland, MD
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
## 2016-04-13 NOTE — Progress Notes (Signed)
IV and tele removed from patient. Discharge instructions given to patient along with hard copy prescriptions. Patient verbalized understanding. No acute distress at this time. Family member at bedside and will be transporting patient home.

## 2016-04-13 NOTE — Progress Notes (Signed)
If stress normal - can be discharged with cardiology follow up

## 2016-04-26 DIAGNOSIS — M79662 Pain in left lower leg: Secondary | ICD-10-CM | POA: Diagnosis not present

## 2016-04-26 DIAGNOSIS — M5416 Radiculopathy, lumbar region: Secondary | ICD-10-CM | POA: Diagnosis not present

## 2016-04-26 DIAGNOSIS — G890 Central pain syndrome: Secondary | ICD-10-CM | POA: Diagnosis not present

## 2016-04-26 DIAGNOSIS — G894 Chronic pain syndrome: Secondary | ICD-10-CM | POA: Diagnosis not present

## 2016-04-26 DIAGNOSIS — M25551 Pain in right hip: Secondary | ICD-10-CM | POA: Diagnosis not present

## 2016-04-26 DIAGNOSIS — M25552 Pain in left hip: Secondary | ICD-10-CM | POA: Diagnosis not present

## 2016-05-16 ENCOUNTER — Encounter: Payer: Self-pay | Admitting: Emergency Medicine

## 2016-05-16 ENCOUNTER — Emergency Department: Payer: Managed Care, Other (non HMO)

## 2016-05-16 ENCOUNTER — Inpatient Hospital Stay
Admission: EM | Admit: 2016-05-16 | Discharge: 2016-05-18 | DRG: 308 | Disposition: A | Discharge status: Home / Self Care | Payer: Managed Care, Other (non HMO) | Attending: Internal Medicine | Admitting: Internal Medicine

## 2016-05-16 DIAGNOSIS — Z833 Family history of diabetes mellitus: Secondary | ICD-10-CM

## 2016-05-16 DIAGNOSIS — I110 Hypertensive heart disease with heart failure: Secondary | ICD-10-CM | POA: Diagnosis present

## 2016-05-16 DIAGNOSIS — Z87891 Personal history of nicotine dependence: Secondary | ICD-10-CM

## 2016-05-16 DIAGNOSIS — M109 Gout, unspecified: Secondary | ICD-10-CM | POA: Diagnosis present

## 2016-05-16 DIAGNOSIS — I252 Old myocardial infarction: Secondary | ICD-10-CM

## 2016-05-16 DIAGNOSIS — R0789 Other chest pain: Secondary | ICD-10-CM | POA: Diagnosis not present

## 2016-05-16 DIAGNOSIS — I5033 Acute on chronic diastolic (congestive) heart failure: Secondary | ICD-10-CM | POA: Diagnosis present

## 2016-05-16 DIAGNOSIS — K219 Gastro-esophageal reflux disease without esophagitis: Secondary | ICD-10-CM | POA: Diagnosis present

## 2016-05-16 DIAGNOSIS — Z888 Allergy status to other drugs, medicaments and biological substances status: Secondary | ICD-10-CM

## 2016-05-16 DIAGNOSIS — Z7982 Long term (current) use of aspirin: Secondary | ICD-10-CM

## 2016-05-16 DIAGNOSIS — Z794 Long term (current) use of insulin: Secondary | ICD-10-CM

## 2016-05-16 DIAGNOSIS — Z8249 Family history of ischemic heart disease and other diseases of the circulatory system: Secondary | ICD-10-CM

## 2016-05-16 DIAGNOSIS — M1990 Unspecified osteoarthritis, unspecified site: Secondary | ICD-10-CM | POA: Diagnosis present

## 2016-05-16 DIAGNOSIS — E119 Type 2 diabetes mellitus without complications: Secondary | ICD-10-CM | POA: Diagnosis present

## 2016-05-16 DIAGNOSIS — Z7902 Long term (current) use of antithrombotics/antiplatelets: Secondary | ICD-10-CM

## 2016-05-16 DIAGNOSIS — I2510 Atherosclerotic heart disease of native coronary artery without angina pectoris: Secondary | ICD-10-CM | POA: Diagnosis present

## 2016-05-16 DIAGNOSIS — Z79899 Other long term (current) drug therapy: Secondary | ICD-10-CM

## 2016-05-16 DIAGNOSIS — E785 Hyperlipidemia, unspecified: Secondary | ICD-10-CM | POA: Diagnosis present

## 2016-05-16 DIAGNOSIS — I472 Ventricular tachycardia, unspecified: Secondary | ICD-10-CM

## 2016-05-16 DIAGNOSIS — Z96653 Presence of artificial knee joint, bilateral: Secondary | ICD-10-CM | POA: Diagnosis present

## 2016-05-16 DIAGNOSIS — R079 Chest pain, unspecified: Secondary | ICD-10-CM | POA: Diagnosis present

## 2016-05-16 DIAGNOSIS — I5020 Unspecified systolic (congestive) heart failure: Secondary | ICD-10-CM

## 2016-05-16 DIAGNOSIS — Z955 Presence of coronary angioplasty implant and graft: Secondary | ICD-10-CM

## 2016-05-16 LAB — CBC
HCT: 44.3 % (ref 40.0–52.0)
Hemoglobin: 15 g/dL (ref 13.0–18.0)
MCH: 29.2 pg (ref 26.0–34.0)
MCHC: 33.9 g/dL (ref 32.0–36.0)
MCV: 86.3 fL (ref 80.0–100.0)
PLATELETS: 229 10*3/uL (ref 150–440)
RBC: 5.13 MIL/uL (ref 4.40–5.90)
RDW: 13.3 % (ref 11.5–14.5)
WBC: 8.2 10*3/uL (ref 3.8–10.6)

## 2016-05-16 LAB — BASIC METABOLIC PANEL
Anion gap: 9 (ref 5–15)
BUN: 15 mg/dL (ref 6–20)
CALCIUM: 8.4 mg/dL — AB (ref 8.9–10.3)
CHLORIDE: 104 mmol/L (ref 101–111)
CO2: 22 mmol/L (ref 22–32)
CREATININE: 0.98 mg/dL (ref 0.61–1.24)
GFR calc non Af Amer: >60 mL/min (ref >60)
Glucose, Bld: 277 mg/dL — ABNORMAL HIGH (ref 65–99)
Potassium: 3.7 mmol/L (ref 3.5–5.1)
SODIUM: 135 mmol/L (ref 135–145)

## 2016-05-16 LAB — TROPONIN I
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
## 2016-05-16 NOTE — ED Triage Notes (Addendum)
Patient with intermittent central chest pain radiating down left arm  with shortness of breath times three days. Patient with a history of a MI.

## 2016-05-17 DIAGNOSIS — E785 Hyperlipidemia, unspecified: Secondary | ICD-10-CM | POA: Diagnosis present

## 2016-05-17 DIAGNOSIS — M109 Gout, unspecified: Secondary | ICD-10-CM | POA: Diagnosis present

## 2016-05-17 DIAGNOSIS — Z8249 Family history of ischemic heart disease and other diseases of the circulatory system: Secondary | ICD-10-CM | POA: Diagnosis not present

## 2016-05-17 DIAGNOSIS — E119 Type 2 diabetes mellitus without complications: Secondary | ICD-10-CM | POA: Diagnosis present

## 2016-05-17 DIAGNOSIS — I252 Old myocardial infarction: Secondary | ICD-10-CM | POA: Diagnosis not present

## 2016-05-17 DIAGNOSIS — M1990 Unspecified osteoarthritis, unspecified site: Secondary | ICD-10-CM | POA: Diagnosis present

## 2016-05-17 DIAGNOSIS — R0789 Other chest pain: Secondary | ICD-10-CM | POA: Diagnosis present

## 2016-05-17 DIAGNOSIS — R079 Chest pain, unspecified: Secondary | ICD-10-CM | POA: Diagnosis present

## 2016-05-17 DIAGNOSIS — I110 Hypertensive heart disease with heart failure: Secondary | ICD-10-CM | POA: Diagnosis present

## 2016-05-17 DIAGNOSIS — Z794 Long term (current) use of insulin: Secondary | ICD-10-CM | POA: Diagnosis not present

## 2016-05-17 DIAGNOSIS — Z833 Family history of diabetes mellitus: Secondary | ICD-10-CM | POA: Diagnosis not present

## 2016-05-17 DIAGNOSIS — Z7902 Long term (current) use of antithrombotics/antiplatelets: Secondary | ICD-10-CM | POA: Diagnosis not present

## 2016-05-17 DIAGNOSIS — I472 Ventricular tachycardia, unspecified: Secondary | ICD-10-CM

## 2016-05-17 DIAGNOSIS — Z7982 Long term (current) use of aspirin: Secondary | ICD-10-CM | POA: Diagnosis not present

## 2016-05-17 DIAGNOSIS — K219 Gastro-esophageal reflux disease without esophagitis: Secondary | ICD-10-CM | POA: Diagnosis present

## 2016-05-17 DIAGNOSIS — I5033 Acute on chronic diastolic (congestive) heart failure: Secondary | ICD-10-CM | POA: Diagnosis present

## 2016-05-17 DIAGNOSIS — Z79899 Other long term (current) drug therapy: Secondary | ICD-10-CM | POA: Diagnosis not present

## 2016-05-17 DIAGNOSIS — I2510 Atherosclerotic heart disease of native coronary artery without angina pectoris: Secondary | ICD-10-CM | POA: Diagnosis present

## 2016-05-17 DIAGNOSIS — Z96653 Presence of artificial knee joint, bilateral: Secondary | ICD-10-CM | POA: Diagnosis present

## 2016-05-17 DIAGNOSIS — Z87891 Personal history of nicotine dependence: Secondary | ICD-10-CM | POA: Diagnosis not present

## 2016-05-17 DIAGNOSIS — Z955 Presence of coronary angioplasty implant and graft: Secondary | ICD-10-CM | POA: Diagnosis not present

## 2016-05-17 DIAGNOSIS — Z888 Allergy status to other drugs, medicaments and biological substances status: Secondary | ICD-10-CM | POA: Diagnosis not present

## 2016-05-17 LAB — GLUCOSE, CAPILLARY
Glucose-Capillary: 188 mg/dL — ABNORMAL HIGH (ref 65–99)
Glucose-Capillary: 187 mg/dL — ABNORMAL HIGH (ref 65–99)
Glucose-Capillary: 214 mg/dL — ABNORMAL HIGH (ref 65–99)
Glucose-Capillary: 162 mg/dL — ABNORMAL HIGH (ref 65–99)

## 2016-05-17 LAB — TROPONIN I
Troponin I: <0.03 ng/mL (ref <0.03)
Troponin I: <0.03 ng/mL (ref <0.03)
Troponin I: <0.03 ng/mL (ref <0.03)
Troponin I: <0.03 ng/mL (ref <0.03)

## 2016-05-17 LAB — POTASSIUM: POTASSIUM: 4.2 mmol/L (ref 3.5–5.1)

## 2016-05-17 LAB — MAGNESIUM: Magnesium: 1.8 mg/dL (ref 1.7–2.4)

## 2016-05-17 LAB — TSH: TSH: 1.475 u[IU]/mL (ref 0.350–4.500)

## 2016-05-17 MED ORDER — ACETAMINOPHEN 650 MG RE SUPP: 650.0000 mg | Freq: Four times a day (QID) | RECTAL | Status: DC | PRN

## 2016-05-17 MED ORDER — ONDANSETRON HCL 4 MG PO TABS: 4.0000 mg | ORAL_TABLET | Freq: Four times a day (QID) | ORAL | Status: DC | PRN

## 2016-05-17 MED ORDER — CLOPIDOGREL BISULFATE 75 MG PO TABS
75.0000 mg | ORAL_TABLET | Freq: Every day | ORAL | Status: DC
  Administered 2016-05-17 – 2016-05-18 (×2): 75 mg via ORAL
  Filled 2016-05-17 (×2): qty 1

## 2016-05-17 MED ORDER — ACETAMINOPHEN 325 MG PO TABS
650.0000 mg | ORAL_TABLET | Freq: Four times a day (QID) | ORAL | Status: DC | PRN
  Administered 2016-05-17: 650 mg via ORAL
  Filled 2016-05-17: qty 2

## 2016-05-17 MED ORDER — INSULIN ASPART 100 UNIT/ML ~~LOC~~ SOLN
0.0000 [IU] | Freq: Three times a day (TID) | SUBCUTANEOUS | Status: DC
  Administered 2016-05-17: 3 [IU] via SUBCUTANEOUS
  Administered 2016-05-17: 5 [IU] via SUBCUTANEOUS
  Administered 2016-05-17 – 2016-05-18 (×2): 3 [IU] via SUBCUTANEOUS
  Administered 2016-05-18: 5 [IU] via SUBCUTANEOUS
  Filled 2016-05-17: qty 5
  Filled 2016-05-17 (×3): qty 3
  Filled 2016-05-17: qty 5

## 2016-05-17 MED ORDER — LISINOPRIL 20 MG PO TABS
20.0000 mg | ORAL_TABLET | Freq: Every day | ORAL | Status: DC
  Administered 2016-05-17 – 2016-05-18 (×2): 20 mg via ORAL
  Filled 2016-05-17 (×2): qty 1

## 2016-05-17 MED ORDER — ENOXAPARIN SODIUM 40 MG/0.4ML ~~LOC~~ SOLN
40.0000 mg | SUBCUTANEOUS | Status: DC
  Administered 2016-05-17: 40 mg via SUBCUTANEOUS
  Filled 2016-05-17: qty 0.4

## 2016-05-17 MED ORDER — POTASSIUM CHLORIDE CRYS ER 10 MEQ PO TBCR
10.0000 meq | EXTENDED_RELEASE_TABLET | Freq: Two times a day (BID) | ORAL | Status: DC
  Administered 2016-05-17 – 2016-05-18 (×2): 10 meq via ORAL
  Filled 2016-05-17 (×2): qty 1

## 2016-05-17 MED ORDER — AMLODIPINE BESYLATE 10 MG PO TABS
10.0000 mg | ORAL_TABLET | Freq: Every day | ORAL | Status: DC
  Administered 2016-05-17 – 2016-05-18 (×2): 10 mg via ORAL
  Filled 2016-05-17 (×2): qty 1

## 2016-05-17 MED ORDER — METOPROLOL TARTRATE 25 MG PO TABS
25.0000 mg | ORAL_TABLET | Freq: Two times a day (BID) | ORAL | Status: DC
  Administered 2016-05-17 – 2016-05-18 (×3): 25 mg via ORAL
  Filled 2016-05-17 (×3): qty 1

## 2016-05-17 MED ORDER — INSULIN GLARGINE 100 UNIT/ML ~~LOC~~ SOLN
14.0000 [IU] | Freq: Every day | SUBCUTANEOUS | Status: DC
  Filled 2016-05-17 (×2): qty 0.14

## 2016-05-17 MED ORDER — ONDANSETRON HCL 4 MG/2ML IJ SOLN: 4.0000 mg | Freq: Four times a day (QID) | INTRAMUSCULAR | Status: DC | PRN

## 2016-05-17 MED ORDER — PANTOPRAZOLE SODIUM 40 MG PO TBEC
40.0000 mg | DELAYED_RELEASE_TABLET | Freq: Every day | ORAL | Status: DC
  Administered 2016-05-17 – 2016-05-18 (×2): 40 mg via ORAL
  Filled 2016-05-17 (×2): qty 1

## 2016-05-17 MED ORDER — FUROSEMIDE 10 MG/ML IJ SOLN
40.0000 mg | Freq: Two times a day (BID) | INTRAMUSCULAR | Status: DC
  Administered 2016-05-17 – 2016-05-18 (×3): 40 mg via INTRAVENOUS
  Filled 2016-05-17 (×3): qty 4

## 2016-05-17 MED ORDER — VITAMIN D 1000 UNITS PO TABS
1000.0000 [IU] | ORAL_TABLET | Freq: Every day | ORAL | Status: DC
  Administered 2016-05-17 – 2016-05-18 (×2): 1000 [IU] via ORAL
  Filled 2016-05-17 (×2): qty 1

## 2016-05-17 MED ORDER — MAGNESIUM SULFATE IN D5W 1-5 GM/100ML-% IV SOLN
1.0000 g | Freq: Once | INTRAVENOUS | Status: AC
  Administered 2016-05-17: 1 g via INTRAVENOUS
  Filled 2016-05-17: qty 100

## 2016-05-17 MED ORDER — ALBUTEROL SULFATE (2.5 MG/3ML) 0.083% IN NEBU: 3.0000 mL | INHALATION_SOLUTION | RESPIRATORY_TRACT | Status: DC | PRN

## 2016-05-17 MED ORDER — OXYCODONE HCL 5 MG PO TABS: 10.0000 mg | ORAL_TABLET | Freq: Four times a day (QID) | ORAL | Status: DC | PRN

## 2016-05-17 MED ORDER — DOCUSATE SODIUM 100 MG PO CAPS
100.0000 mg | ORAL_CAPSULE | Freq: Two times a day (BID) | ORAL | Status: DC
  Administered 2016-05-17 (×2): 100 mg via ORAL
  Filled 2016-05-17 (×3): qty 1

## 2016-05-17 MED ORDER — EZETIMIBE 10 MG PO TABS
10.0000 mg | ORAL_TABLET | Freq: Every day | ORAL | Status: DC
  Administered 2016-05-17 – 2016-05-18 (×2): 10 mg via ORAL
  Filled 2016-05-17 (×2): qty 1

## 2016-05-17 MED ORDER — ASPIRIN EC 81 MG PO TBEC
81.0000 mg | DELAYED_RELEASE_TABLET | Freq: Every day | ORAL | Status: DC
  Administered 2016-05-17 – 2016-05-18 (×2): 81 mg via ORAL
  Filled 2016-05-17 (×2): qty 1

## 2016-05-17 MED ORDER — SODIUM CHLORIDE 0.9% FLUSH
3.0000 mL | Freq: Two times a day (BID) | INTRAVENOUS | Status: DC
  Administered 2016-05-17 – 2016-05-18 (×3): 3 mL via INTRAVENOUS

## 2016-05-17 MED ORDER — NITROGLYCERIN 0.4 MG SL SUBL: 0.4000 mg | SUBLINGUAL_TABLET | SUBLINGUAL | Status: DC | PRN

## 2016-05-17 NOTE — Progress Notes (Signed)
[facility] [place] NAME: Armanie Dossett    MR#:  [id number]  DATE OF BIRTH:  1951-03-01  SUBJECTIVE:  CHIEF COMPLAINT:   Chief Complaint  Patient presents with  . Chest Pain  . Shortness of Breath  feeling short of breath, had 10 beats run of V. tach, asymptomatic REVIEW OF SYSTEMS:  Review of Systems  Constitutional: Negative for chills, fever and weight loss.  HENT: Negative for nosebleeds and sore throat.   Eyes: Negative for blurred vision.  Respiratory: Positive for shortness of breath. Negative for cough and wheezing.   Cardiovascular: Negative for chest pain, orthopnea, leg swelling and PND.  Gastrointestinal: Negative for abdominal pain, constipation, diarrhea, heartburn, nausea and vomiting.  Genitourinary: Negative for dysuria and urgency.  Musculoskeletal: Negative for back pain.  Skin: Negative for rash.  Neurological: Negative for dizziness, speech change, focal weakness and headaches.  Endo/Heme/Allergies: Does not bruise/bleed easily.  Psychiatric/Behavioral: Negative for depression.    DRUG ALLERGIES:   Allergies  Allergen Reactions  . Lipitor [Atorvastatin]     myalgias   VITALS:  Blood pressure 133/69, pulse 63, temperature 98.2 F (36.8 C), temperature source Oral, resp. rate 18, height 5\' 9"  (1.753 m), weight 113.6 kg (250 lb 8 oz), SpO2 96 %. PHYSICAL EXAMINATION:  Physical Exam  Constitutional: He is oriented to person, place, and time and well-developed, well-nourished, and in no distress.  HENT:  Head: Normocephalic and atraumatic.  Eyes: Conjunctivae and EOM are normal. Pupils are equal, round, and reactive to light.  Neck: Normal range of motion. Neck supple. No tracheal deviation present. No thyromegaly present.  Cardiovascular: Normal rate, regular rhythm and normal heart sounds.   Pulmonary/Chest: Effort normal and breath sounds normal. No respiratory distress. He has no wheezes. He exhibits no  tenderness.  Abdominal: Soft. Bowel sounds are normal. He exhibits no distension. There is no tenderness.  Musculoskeletal: Normal range of motion. He exhibits edema.  Neurological: He is alert and oriented to person, place, and time. No cranial nerve deficit.  Skin: Skin is warm and dry. No rash noted.  Psychiatric: Mood and affect normal.   LABORATORY PANEL:   CBC  Recent Labs Lab 05/16/16 2009  WBC 8.2  HGB 15.0  HCT 44.3  PLT 229   ------------------------------------------------------------------------------------------------------------------ Chemistries   Recent Labs Lab 05/16/16 2009 05/17/16 1619  NA 135  --   K 3.7 4.2  CL 104  --   CO2 22  --   GLUCOSE 277*  --   BUN 15  --   CREATININE 0.98  --   CALCIUM 8.4*  --   MG  --  1.8   RADIOLOGY:  Dg Chest 2 View  Result Date: 05/16/2016 CLINICAL DATA:  Acute onset of intermittent central chest pain, radiating down the left arm. Shortness of breath. Initial encounter. EXAM: CHEST  2 VIEW COMPARISON:  Chest radiograph performed 04/12/2016 FINDINGS: The lungs are well-aerated and clear. There is no evidence of focal opacification, pleural effusion or pneumothorax. The heart is normal in size; the mediastinal contour is within normal limits. No acute osseous abnormalities are seen. IMPRESSION: No acute cardiopulmonary process seen. Electronically Signed   By: Garald Balding M.D.   On: 05/16/2016 20:53   ASSESSMENT AND PLAN:  65 year old African-American male with a piece history of coronary artery disease CHF with diastolic dysfunction status post PCI of the right coronary and follow-up stress tests in the past which were negative presented to  the hospital with chest pain. 2 sets of cardiac enzymes were negative.  * Acute on chronic diastolic heart failure - Last echo showed EF of 55% - Continue diuresis with Lasix 40 mg IV twice a day - strict  I's and O's and daily weights - Neg 2.4 L thus far  * Nonsustained  ventilator tachycardia - He had 10 beats of asymptomatic V. tach earlier this morning - Recommend keeping potassium close to 4 and M[place]ium close to 2.0  * Chest pain: Ruled out with negative serial troponins  * Coronary artery disease: Continue aspirin and Plavix * Essential hypertension: Continue Norvasc, lisinopril and metoprolol. * Diabetes mellitus type 2: Continue basal insulin as well as sliding scale * DVT prophylaxis: Lovenox * GI prophylaxis: Pantoprazole    All the records are reviewed and case discussed with Care Management/Social Worker. Management plans discussed with the patient, family and they are in agreement.  CODE STATUS: Full code  TOTAL TIME TAKING CARE OF THIS PATIENT: 35 minutes.   More than 50% of the time was spent in counseling/coordination of care: YES  POSSIBLE D/C IN 1-2 DAYS, DEPENDING ON CLINICAL CONDITION.   Max Sane M.D on 05/17/2016 at 5:52 PM  Between 7am to 6pm - Pager - [phone]  After 6pm go to [email or web address] - Proofreader  [facility]  Office  [phone]  CC: Primary care physician; Maximino Greenland, MD  Note: This dictation was prepared with Dragon dictation along with smaller phrase technology. Any transcriptional errors that result from this process are unintentional.

## 2016-05-17 NOTE — Progress Notes (Signed)
A&O. Independent. Admitted for chest pain. No complaints now. ON RA. No S/S distress. For possible stress test  Today.

## 2016-05-17 NOTE — Progress Notes (Signed)
@   1006 this AM CCMD called stating that patient had had a 10 beat run of V-tach, Dr. Manuella Ghazi made aware at this time. Asked to call Dr. Humphrey Rolls as soon as possible.   Hayden with Dr. Humphrey Rolls- Dr. Humphrey Rolls asked for updated K and Mag levels on patient and gave verbal order for Kdur 10mg  PO and 1G of Mag IV.  Will continue to monitor and follow plan of care.

## 2016-05-17 NOTE — Progress Notes (Signed)
Inpatient Diabetes Program Recommendations  AACE/ADA: New Consensus Statement on Inpatient Glycemic Control (2015)  Target Ranges:  Prepandial:   less than 140 mg/dL      Peak postprandial:   less than 180 mg/dL (1-2 hours)      Critically ill patients:  140 - 180 mg/dL   Review of Glycemic Control  Diabetes history: DM 2 Outpatient Diabetes medications: Lantus 21 units QHS Current orders for Inpatient glycemic control: Lantus 14 units QHS, Novolog Moderate Correction (0-15 units) TID  Inpatient Diabetes Program Recommendations:   Will watch on current insulin regimen, however please consider modifying diet for heart healthy carb modified diet.    Thanks,  Tama Headings RN, MSN, [facility] Inpatient Diabetes Coordinator Team Pager [phone] (8a-5p)
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
## 2016-05-17 NOTE — Care Management Obs Status (Signed)
[place] NOTIFICATION   Patient Details  Name: James Maxwell MRN: [id number] Date of Birth: 1951/01/21   Medicare Observation Status Notification Given:  Yes    Katrina Stack, RN 05/17/2016, 11:52 AM

## 2016-05-17 NOTE — Progress Notes (Signed)
James Maxwell is a 65 y.o. male  [id number]  Primary Cardiologist: Neoma Laming  Reason for Consultation: Chest pain HPI: This is a 65 year old African-American male with a piece history of coronary artery disease CHF with diastolic dysfunction status post PCI of the right coronary and follow-up stress tests in the past which were negative presented to the hospital with chest pain. 2 sets of cardiac enzymes were negative.   Review of Systems: Patient states the chest pain was mostly in the left side with left arm pain being the main complaint. But patient was more short of breath than having chest pain   Past Medical History:  Diagnosis Date  . Arthritis   . Chronic diastolic CHF (congestive heart failure) ([place])    a. 04/2014 Echo: EF 55%, Gr1 DD.  [name] Coronary artery disease    a. 2006 s/p PCI RCA;  b. 2012 MV: no ischemia.  . Diabetes mellitus without complication ([place])   . GERD (gastroesophageal reflux disease)   . History of gout   . Hyperlipidemia   . Hypertension   . MI, old 1997    Medications Prior to Admission  Medication Sig Dispense Refill  . albuterol (PROVENTIL HFA;VENTOLIN HFA) 108 (90 Base) MCG/ACT inhaler Inhale 1-2 puffs into the lungs every 4 (four) hours as needed for wheezing or shortness of breath. 1 Inhaler 0  . amLODipine (NORVASC) 10 MG tablet Take 1 tablet (10 mg total) by mouth daily. 30 tablet 0  . aspirin EC 81 MG tablet Take 81 mg by mouth daily.    . Cholecalciferol (VITAMIN D PO) Take 1 tablet by mouth daily.    [name] CIALIS 20 MG tablet Take 20 mg by mouth daily as needed for erectile dysfunction.   0  . clopidogrel (PLAVIX) 75 MG tablet Take 75 mg by mouth daily.    [name] ezetimibe (ZETIA) 10 MG tablet Take 1 tablet (10 mg total) by mouth daily. 30 tablet 0  . insulin glargine (LANTUS) 100 UNIT/ML injection Inject 21 Units into the skin at bedtime.    [name] lisinopril (PRINIVIL,ZESTRIL) 20 MG tablet Take 20 mg by mouth daily.    . metoprolol tartrate  (LOPRESSOR) 25 MG tablet Take 1 tablet (25 mg total) by mouth 2 (two) times daily. 60 tablet 0  . nitroGLYCERIN (NITROSTAT) 0.4 MG SL tablet Place 1 tablet (0.4 mg total) under the tongue every 5 (five) minutes as needed for chest pain. 25 tablet 5  . oxyCODONE (ROXICODONE) 15 MG immediate release tablet Take 15 mg by mouth 4 (four) times daily.  0  . pantoprazole (PROTONIX) 40 MG tablet Take 40 mg by mouth daily.       [name] amLODipine  10 mg Oral Daily  . aspirin EC  81 mg Oral Daily  . cholecalciferol  1,000 Units Oral Daily  . clopidogrel  75 mg Oral Daily  . docusate sodium  100 mg Oral BID  . enoxaparin (LOVENOX) injection  40 mg Subcutaneous Q24H  . ezetimibe  10 mg Oral Daily  . insulin aspart  0-15 Units Subcutaneous TID WC  . insulin glargine  14 Units Subcutaneous QHS  . lisinopril  20 mg Oral Daily  . metoprolol tartrate  25 mg Oral BID  . pantoprazole  40 mg Oral Daily  . sodium chloride flush  3 mL Intravenous Q12H    Infusions:   Allergies  Allergen Reactions  . Lipitor [Atorvastatin]     myalgias    Social History  Social History  . Marital status: Married    Spouse name: N/A  . Number of children: N/A  . Years of education: N/A   Occupational History  . Not on file.   Social History Main Topics  . Smoking status: Former Smoker    Quit date: 07/16/1995  . Smokeless tobacco: Never Used  . Alcohol use No  . Drug use: No  . Sexual activity: Not on file   Other Topics Concern  . Not on file   Social History Narrative   Lives in [place] with [place].  Does not routinely exercise.    Family History  Problem Relation Age of Onset  . Diabetes Mother   . Hypertension Mother   . Heart attack Mother   . Hypertension Sister   . Cancer Brother   . Heart attack Son   . Cancer Brother   . Heart attack Brother   . Hypertension Sister     PHYSICAL EXAM: Vitals:   05/17/16 0422 05/17/16 0423  BP: (!) 173/88 (!) 159/87  Pulse: 66 74  Resp: 18    Temp: 97.6 F (36.4 C)      Intake/Output Summary (Last 24 hours) at 05/17/16 0858 Last data filed at 05/17/16 0800  Gross per 24 hour  Intake                0 ml  Output              900 ml  Net             -900 ml    General:  Well appearing. No respiratory difficulty HEENT: normal Neck: supple. no JVD. Carotids 2+ bilat; no bruits. No lymphadenopathy or thryomegaly appreciated. Cor: PMI nondisplaced. Regular rate & rhythm. No rubs, gallops or murmurs. Lungs: clear Abdomen: soft, nontender, nondistended. No hepatosplenomegaly. No bruits or masses. Good bowel sounds. Extremities: no cyanosis, clubbing, rash, edema Neuro: alert & oriented x 3, cranial nerves grossly intact. moves all 4 extremities w/o difficulty. Affect pleasant.  ECG: Normal sinus rhythm no acute changes  Results for orders placed or performed during the hospital encounter of 05/16/16 (from the past 24 hour(s))  Basic metabolic panel     Status: Abnormal   Collection Time: 05/16/16  8:09 PM  Result Value Ref Range   Sodium 135 135 - 145 mmol/L   Potassium 3.7 3.5 - 5.1 mmol/L   Chloride 104 101 - 111 mmol/L   CO2 22 22 - 32 mmol/L   Glucose, Bld 277 (H) 65 - 99 mg/dL   BUN 15 6 - 20 mg/dL   Creatinine, Ser 0.98 0.61 - 1.24 mg/dL   Calcium 8.4 (L) 8.9 - 10.3 mg/dL   GFR calc non Af Amer >60 >60 mL/min   GFR calc Af Amer >60 >60 mL/min   Anion gap 9 5 - 15  CBC     Status: None   Collection Time: 05/16/16  8:09 PM  Result Value Ref Range   WBC 8.2 3.8 - 10.6 K/uL   RBC 5.13 4.40 - 5.90 MIL/uL   Hemoglobin 15.0 13.0 - 18.0 g/dL   HCT 44.3 40.0 - 52.0 %   MCV 86.3 80.0 - 100.0 fL   MCH 29.2 26.0 - 34.0 pg   MCHC 33.9 32.0 - 36.0 g/dL   RDW 13.3 11.5 - 14.5 %   Platelets 229 150 - 440 K/uL  Troponin I     Status: None   Collection Time: 05/16/16  8:09  PM  Result Value Ref Range   Troponin I <0.03 <0.03 ng/mL  Troponin I     Status: None   Collection Time: 05/17/16 12:22 AM  Result Value Ref  Range   Troponin I <0.03 <0.03 ng/mL  TSH     Status: None   Collection Time: 05/17/16  4:40 AM  Result Value Ref Range   TSH 1.475 0.350 - 4.500 uIU/mL  Troponin I     Status: None   Collection Time: 05/17/16  4:40 AM  Result Value Ref Range   Troponin I <0.03 <0.03 ng/mL  Glucose, capillary     Status: Abnormal   Collection Time: 05/17/16  7:51 AM  Result Value Ref Range   Glucose-Capillary 188 (H) 65 - 99 mg/dL   Dg Chest 2 View  Result Date: 05/16/2016 CLINICAL DATA:  Acute onset of intermittent central chest pain, radiating down the left arm. Shortness of breath. Initial encounter. EXAM: CHEST  2 VIEW COMPARISON:  Chest radiograph performed 04/12/2016 FINDINGS: The lungs are well-aerated and clear. There is no evidence of focal opacification, pleural effusion or pneumothorax. The heart is normal in size; the mediastinal contour is within normal limits. No acute osseous abnormalities are seen. IMPRESSION: No acute cardiopulmonary process seen. Electronically Signed   By: Garald Balding M.D.   On: 05/16/2016 20:53     ASSESSMENT AND PLAN: Congestive heart failure with atypical chest pain and cardiac enzymes and EKG unremarkable. We will start the patient on Lasix 40 mg IV twice a day and add hydralazine 25 twice a day if needed to control blood pressure. Right now patient is not having chest pain but is short of breath and wheezing given Lasix to cause diuresis.  Dashanna Kinnamon A. Medications

## 2016-05-18 LAB — GLUCOSE, CAPILLARY
Glucose-Capillary: 179 mg/dL — ABNORMAL HIGH (ref 65–99)
Glucose-Capillary: 223 mg/dL — ABNORMAL HIGH (ref 65–99)

## 2016-05-18 LAB — HEMOGLOBIN A1C
Hgb A1c MFr Bld: 10 % — ABNORMAL HIGH (ref 4.8–5.6)
Mean Plasma Glucose: 240 mg/dL

## 2016-05-18 MED ORDER — POTASSIUM CHLORIDE CRYS ER 20 MEQ PO TBCR: 20.0000 meq | EXTENDED_RELEASE_TABLET | Freq: Every day | ORAL | 0 refills | Status: DC

## 2016-05-18 MED ORDER — FUROSEMIDE 40 MG PO TABS: 40.0000 mg | ORAL_TABLET | Freq: Every day | ORAL | 0 refills | Status: DC

## 2016-05-18 MED ORDER — METOPROLOL TARTRATE 25 MG PO TABS: 25.0000 mg | ORAL_TABLET | Freq: Two times a day (BID) | ORAL | 0 refills | Status: DC

## 2016-05-18 NOTE — Discharge Instructions (Signed)
Heart Failure  Heart failure means your heart has trouble pumping blood. This makes it hard for your body to work well. Heart failure is usually a long-term (chronic) condition. You must take good care of yourself and follow your doctor's treatment plan.  HOME CARE   Take your heart medicine as told by your doctor.    Do not stop taking medicine unless your doctor tells you to.    Do not skip any dose of medicine.    Refill your medicines before they run out.    Take other medicines only as told by your doctor or pharmacist.   Stay active if told by your doctor. The elderly and people with severe heart failure should talk with a doctor about physical activity.   Eat heart-healthy foods. Choose foods that are without trans fat and are low in saturated fat, cholesterol, and salt (sodium). This includes fresh or frozen fruits and vegetables, fish, lean meats, fat-free or low-fat dairy foods, whole grains, and high-fiber foods. Lentils and dried peas and beans (legumes) are also good choices.   Limit salt if told by your doctor.   Cook in a healthy way. Roast, grill, broil, bake, poach, steam, or stir-fry foods.   Limit fluids as told by your doctor.   Weigh yourself every morning. Do this after you pee (urinate) and before you eat breakfast. Write down your weight to give to your doctor.   Take your blood pressure and write it down if your doctor tells you to.   Ask your doctor how to check your pulse. Check your pulse as told.   Lose weight if told by your doctor.   Stop smoking or chewing tobacco. Do not use gum or patches that help you quit without your doctor's approval.   Schedule and go to doctor visits as told.   Nonpregnant women should have no more than 1 drink a day. Men should have no more than 2 drinks a day. Talk to your doctor about drinking alcohol.   Stop illegal drug use.   Stay current with shots (immunizations).   Manage your health conditions as told by your doctor.   Learn to  manage your stress.   Rest when you are tired.   If it is really hot outside:    Avoid intense activities.    Use air conditioning or fans, or get in a cooler place.    Avoid caffeine and alcohol.    Wear loose-fitting, lightweight, and light-colored clothing.   If it is really cold outside:    Avoid intense activities.    Layer your clothing.    Wear mittens or gloves, a hat, and a scarf when going outside.    Avoid alcohol.   Learn about heart failure and get support as needed.   Get help to maintain or improve your quality of life and your ability to care for yourself as needed.  GET HELP IF:    You gain weight quickly.   You are more short of breath than usual.   You cannot do your normal activities.   You tire easily.   You cough more than normal, especially with activity.   You have any or more puffiness (swelling) in areas such as your hands, feet, ankles, or belly (abdomen).   You cannot sleep because it is hard to breathe.   You feel like your heart is beating fast (palpitations).   You get dizzy or light-headed when you stand up.  GET HELP   RIGHT AWAY IF:    You have trouble breathing.   There is a change in mental status, such as becoming less alert or not being able to focus.   You have chest pain or discomfort.   You faint.  MAKE SURE YOU:    Understand these instructions.   Will watch your condition.   Will get help right away if you are not doing well or get worse.     This information is not intended to replace advice given to you by your health care provider. Make sure you discuss any questions you have with your health care provider.     Document Released: 02/14/2008 Document Revised: 05/28/2014 Document Reviewed: 06/23/2012  Elsevier Interactive Patient Education 2017 [employer]

## 2016-05-18 NOTE — Progress Notes (Signed)
IV and tele removed from patient. Discharge instructions given to patient. Patient verbalized understanding. No distress at this time. Family member is at bedside and will be transporting patient home.  

## 2016-05-18 NOTE — Progress Notes (Signed)
SUBJECTIVE: Patient is feeling much better no longer short of breath and sitting up eating breakfast   Vitals:   05/17/16 1116 05/17/16 2009 05/18/16 0420 05/18/16 0815  BP: 133/69 111/60 125/72 136/78  Pulse: 63 72 65 66  Resp: 18 18 18 16   Temp: 98.2 F (36.8 C) 98.3 F (36.8 C) 98 F (36.7 C) 98.1 F (36.7 C)  TempSrc: Oral Oral Oral Oral  SpO2: 96% 97% 98% 100%  Weight:   239 lb 4.8 oz (108.5 kg)   Height:        Intake/Output Summary (Last 24 hours) at 05/18/16 0853 Last data filed at 05/18/16 0826  Gross per 24 hour  Intake              480 ml  Output             3500 ml  Net            -3020 ml    LABS: Basic Metabolic Panel:  Recent Labs  05/16/16 2009 05/17/16 1619  NA 135  --   K 3.7 4.2  CL 104  --   CO2 22  --   GLUCOSE 277*  --   BUN 15  --   CREATININE 0.98  --   CALCIUM 8.4*  --   MG  --  1.8   Liver Function Tests: No results for input(s): AST, ALT, ALKPHOS, BILITOT, PROT, ALBUMIN in the last 72 hours. No results for input(s): LIPASE, AMYLASE in the last 72 hours. CBC:  Recent Labs  05/16/16 2009  WBC 8.2  HGB 15.0  HCT 44.3  MCV 86.3  PLT 229   Cardiac Enzymes:  Recent Labs  05/17/16 0440 05/17/16 1016 05/17/16 1619  TROPONINI <0.03 <0.03 <0.03   BNP: Invalid input(s): POCBNP D-Dimer: No results for input(s): DDIMER in the last 72 hours. Hemoglobin A1C:  Recent Labs  05/17/16 0440  HGBA1C 10.0*   Fasting Lipid Panel: No results for input(s): CHOL, HDL, LDLCALC, TRIG, CHOLHDL, LDLDIRECT in the last 72 hours. Thyroid Function Tests:  Recent Labs  05/17/16 0440  TSH 1.475   Anemia Panel: No results for input(s): VITAMINB12, FOLATE, FERRITIN, TIBC, IRON, RETICCTPCT in the last 72 hours.   PHYSICAL EXAM General: Well developed, well nourished, in no acute distress HEENT:  Normocephalic and atramatic Neck:  No JVD.  Lungs: Clear bilaterally to auscultation and percussion. Heart: HRRR . Normal S1 and S2 without  gallops or murmurs.  Abdomen: Bowel sounds are positive, abdomen soft and non-tender  Msk:  Back normal, normal gait. Normal strength and tone for age. Extremities: No clubbing, cyanosis or edema.   Neuro: Alert and oriented X 3. Psych:  Good affect, responds appropriately  TELEMETRY: Sinus rhythm  ASSESSMENT AND PLAN: Atypical chest pain with CHF and 1 run of nonsustained V. tach. Potassium and magnesium are fine within normal limits. Patient was given Lasix 40 mg twice a day and then had nonsustained V. tach. Advise discharging the patient on 40 of Lasix and 10 mEq of KCl and will follow up the patient in the office on Tuesday at 2 PM.  Active Problems:   Chest pain   V tach ([place])    James David, MD, [facility] 05/18/2016 8:53 AM

## 2016-05-19 NOTE — Discharge Summary (Signed)
[facility] NAME: James Maxwell    MR#:  [id number]  DATE OF BIRTH:  25-Mar-1951  DATE OF ADMISSION:  05/16/2016   ADMITTING PHYSICIAN: Harrie Foreman, MD  DATE OF DISCHARGE: 05/18/2016 12:21 PM  PRIMARY CARE PHYSICIAN: Maximino Greenland, MD   ADMISSION DIAGNOSIS:  chest pain DISCHARGE DIAGNOSIS:  Active Problems:   Chest pain   V tach ([place])  SECONDARY DIAGNOSIS:   Past Medical History:  Diagnosis Date  . Arthritis   . Chronic diastolic CHF (congestive heart failure) ([place])    a. 04/2014 Echo: EF 55%, Gr1 DD.  [name] Coronary artery disease    a. 2006 s/p PCI RCA;  b. 2012 MV: no ischemia.  . Diabetes mellitus without complication ([place])   . GERD (gastroesophageal reflux disease)   . History of gout   . Hyperlipidemia   . Hypertension   . MI, old [place]:  65 year old African-American male with a piece history of coronary artery disease CHF with diastolic dysfunction status post PCI of the right coronary and follow-up stress tests in the past which were negative presented to the hospital with chest pain. Serial cardiac enzymes were negative.  * Acute on chronic diastolic heart failure - Last echo showed EF of 55% - diuresed well.  * Nonsustained ventilator tachycardia - resolved  * Chest pain: Ruled out with negative serial troponins  * Coronary artery disease: Continue aspirin and Plavix * Essential hypertension: Continue Norvasc, lisinopril and metoprolol. * Diabetes mellitus type 2: Continue home regiemen  DISCHARGE CONDITIONS:  STABLE CONSULTS OBTAINED:  Treatment Team:  Dionisio David, MD DRUG ALLERGIES:   Allergies  Allergen Reactions  . Lipitor [Atorvastatin]     myalgias   DISCHARGE MEDICATIONS:   Allergies as of 05/18/2016      Reactions   Lipitor [atorvastatin]    myalgias      Medication List    TAKE these medications   albuterol 108 (90 Base) MCG/ACT  inhaler Commonly known as:  PROVENTIL HFA;VENTOLIN HFA Inhale 1-2 puffs into the lungs every 4 (four) hours as needed for wheezing or shortness of breath.   amLODipine 10 MG tablet Commonly known as:  NORVASC Take 1 tablet (10 mg total) by mouth daily.   aspirin EC 81 MG tablet Take 81 mg by mouth daily.   CIALIS 20 MG tablet Generic drug:  tadalafil Take 20 mg by mouth daily as needed for erectile dysfunction.   clopidogrel 75 MG tablet Commonly known as:  PLAVIX Take 75 mg by mouth daily.   ezetimibe 10 MG tablet Commonly known as:  ZETIA Take 1 tablet (10 mg total) by mouth daily.   furosemide 40 MG tablet Commonly known as:  LASIX Take 1 tablet (40 mg total) by mouth daily.   insulin glargine 100 UNIT/ML injection Commonly known as:  LANTUS Inject 21 Units into the skin at bedtime.   lisinopril 20 MG tablet Commonly known as:  PRINIVIL,ZESTRIL Take 20 mg by mouth daily.   metoprolol tartrate 25 MG tablet Commonly known as:  LOPRESSOR Take 1 tablet (25 mg total) by mouth 2 (two) times daily.   nitroGLYCERIN 0.4 MG SL tablet Commonly known as:  NITROSTAT Place 1 tablet (0.4 mg total) under the tongue every 5 (five) minutes as needed for chest pain.   oxyCODONE 15 MG immediate release tablet Commonly known as:  ROXICODONE Take 15 mg by mouth 4 (four) times daily.  pantoprazole 40 MG tablet Commonly known as:  PROTONIX Take 40 mg by mouth daily.   potassium chloride SA 20 MEQ tablet Commonly known as:  K-DUR,KLOR-CON Take 1 tablet (20 mEq total) by mouth daily.   VITAMIN D PO Take 1 tablet by mouth daily.        DISCHARGE INSTRUCTIONS:   DIET:  Regular diet DISCHARGE CONDITION:  Good ACTIVITY:  Activity as tolerated OXYGEN:  Home Oxygen: No.  Oxygen Delivery: room air DISCHARGE LOCATION:  home   If you experience worsening of your admission symptoms, develop shortness of breath, life threatening emergency, suicidal or homicidal thoughts you  must seek medical attention immediately by calling 911 or calling your MD immediately  if symptoms less severe.  You Must read complete instructions/literature along with all the possible adverse reactions/side effects for all the Medicines you take and that have been prescribed to you. Take any new Medicines after you have completely understood and accpet all the possible adverse reactions/side effects.   Please note  You were cared for by a hospitalist during your hospital stay. If you have any questions about your discharge medications or the care you received while you were in the hospital after you are discharged, you can call the unit and asked to speak with the hospitalist on call if the hospitalist that took care of you is not available. Once you are discharged, your primary care physician will handle any further medical issues. Please note that NO REFILLS for any discharge medications will be authorized once you are discharged, as it is imperative that you return to your primary care physician (or establish a relationship with a primary care physician if you do not have one) for your aftercare needs so that they can reassess your need for medications and monitor your lab values.    On the day of Discharge:  VITAL SIGNS:  Blood pressure 136/78, pulse 66, temperature 98.1 F (36.7 C), temperature source Oral, resp. rate 16, height 5\' 9"  (1.753 m), weight 108.5 kg (239 lb 4.8 oz), SpO2 100 %. PHYSICAL EXAMINATION:  GENERAL:  65 y.o.-year-old patient lying in the bed with no acute distress.  EYES: Pupils equal, round, reactive to light and accommodation. No scleral icterus. Extraocular muscles intact.  HEENT: Head atraumatic, normocephalic. Oropharynx and nasopharynx clear.  NECK:  Supple, no jugular venous distention. No thyroid enlargement, no tenderness.  LUNGS: Normal breath sounds bilaterally, no wheezing, rales,rhonchi or crepitation. No use of accessory muscles of respiration.   CARDIOVASCULAR: S1, S2 normal. No murmurs, rubs, or gallops.  ABDOMEN: Soft, non-tender, non-distended. Bowel sounds present. No organomegaly or mass.  EXTREMITIES: No pedal edema, cyanosis, or clubbing.  NEUROLOGIC: Cranial nerves II through XII are intact. Muscle strength 5/5 in all extremities. Sensation intact. Gait not checked.  PSYCHIATRIC: The patient is alert and oriented x 3.  SKIN: No obvious rash, lesion, or ulcer.  DATA REVIEW:   CBC  Recent Labs Lab 05/16/16 2009  WBC 8.2  HGB 15.0  HCT 44.3  PLT 229    Chemistries   Recent Labs Lab 05/16/16 2009 05/17/16 1619  NA 135  --   K 3.7 4.2  CL 104  --   CO2 22  --   GLUCOSE 277*  --   BUN 15  --   CREATININE 0.98  --   CALCIUM 8.4*  --   MG  --  1.8    Follow-up Information    Maximino Greenland, MD. Go on 06/05/2016.  Specialty:  Internal Medicine Why:  at 3:30pm Contact information: [address] [place] [address] [phone]        Dionisio David, MD On 05/22/2016.   Specialty:  Cardiology Why:  @ 2 pm Contact information: [place] [address] [phone]           Management plans discussed with the patient, family and they are in agreement.  CODE STATUS: FULL CODE  TOTAL TIME TAKING CARE OF THIS PATIENT: 45 minutes.    Max Sane M.D on 05/19/2016 at 11:06 AM  Between 7am to 6pm - Pager - [phone]  After 6pm go to [email or web address] - Proofreader  [facility]  Office  [phone]  CC: Primary care physician; Maximino Greenland, MD   Note: This dictation was prepared with Dragon dictation along with smaller phrase technology. Any transcriptional errors that result from this process are unintentional.

## 2016-05-22 ENCOUNTER — Ambulatory Visit (HOSPITAL_COMMUNITY): Payer: Managed Care, Other (non HMO) | Attending: Cardiovascular Disease

## 2016-05-22 ENCOUNTER — Other Ambulatory Visit: Payer: Self-pay

## 2016-05-22 DIAGNOSIS — R079 Chest pain, unspecified: Secondary | ICD-10-CM | POA: Insufficient documentation

## 2016-05-25 ENCOUNTER — Telehealth: Payer: Self-pay | Admitting: Cardiovascular Disease

## 2016-05-25 NOTE — Telephone Encounter (Signed)
Returning your call from yesterday. °

## 2016-05-25 NOTE — Telephone Encounter (Signed)
LMTCB

## 2016-05-25 NOTE — Telephone Encounter (Signed)
Patient returned call. Notified of echo results.

## 2016-05-31 DIAGNOSIS — G890 Central pain syndrome: Secondary | ICD-10-CM | POA: Diagnosis not present

## 2016-05-31 DIAGNOSIS — M25551 Pain in right hip: Secondary | ICD-10-CM | POA: Diagnosis not present

## 2016-05-31 DIAGNOSIS — G894 Chronic pain syndrome: Secondary | ICD-10-CM | POA: Diagnosis not present

## 2016-05-31 DIAGNOSIS — M5416 Radiculopathy, lumbar region: Secondary | ICD-10-CM | POA: Diagnosis not present

## 2016-05-31 DIAGNOSIS — M79662 Pain in left lower leg: Secondary | ICD-10-CM | POA: Diagnosis not present

## 2016-05-31 DIAGNOSIS — M25552 Pain in left hip: Secondary | ICD-10-CM | POA: Diagnosis not present

## 2016-05-31 DIAGNOSIS — Z79891 Long term (current) use of opiate analgesic: Secondary | ICD-10-CM | POA: Diagnosis not present

## 2016-06-01 DIAGNOSIS — I10 Essential (primary) hypertension: Secondary | ICD-10-CM | POA: Diagnosis not present

## 2016-06-01 DIAGNOSIS — I2510 Atherosclerotic heart disease of native coronary artery without angina pectoris: Secondary | ICD-10-CM | POA: Diagnosis not present

## 2016-06-01 DIAGNOSIS — E785 Hyperlipidemia, unspecified: Secondary | ICD-10-CM | POA: Diagnosis not present

## 2016-06-01 DIAGNOSIS — I509 Heart failure, unspecified: Secondary | ICD-10-CM | POA: Diagnosis not present

## 2016-06-01 DIAGNOSIS — E669 Obesity, unspecified: Secondary | ICD-10-CM | POA: Diagnosis not present

## 2016-06-01 DIAGNOSIS — E1165 Type 2 diabetes mellitus with hyperglycemia: Secondary | ICD-10-CM | POA: Diagnosis not present

## 2016-06-01 DIAGNOSIS — R0782 Intercostal pain: Secondary | ICD-10-CM | POA: Diagnosis not present

## 2016-06-05 ENCOUNTER — Ambulatory Visit: Payer: Managed Care, Other (non HMO) | Admitting: Family

## 2016-06-08 ENCOUNTER — Encounter: Payer: Self-pay | Admitting: Emergency Medicine

## 2016-06-08 ENCOUNTER — Emergency Department: Payer: Managed Care, Other (non HMO)

## 2016-06-08 ENCOUNTER — Emergency Department
Admission: EM | Admit: 2016-06-08 | Discharge: 2016-06-09 | Disposition: A | Discharge status: Home / Self Care | Payer: Managed Care, Other (non HMO) | Attending: Emergency Medicine | Admitting: Emergency Medicine

## 2016-06-08 DIAGNOSIS — M542 Cervicalgia: Secondary | ICD-10-CM | POA: Diagnosis not present

## 2016-06-08 DIAGNOSIS — S7011XA Contusion of right thigh, initial encounter: Secondary | ICD-10-CM | POA: Diagnosis not present

## 2016-06-08 DIAGNOSIS — I5032 Chronic diastolic (congestive) heart failure: Secondary | ICD-10-CM | POA: Diagnosis not present

## 2016-06-08 DIAGNOSIS — G4489 Other headache syndrome: Secondary | ICD-10-CM | POA: Diagnosis not present

## 2016-06-08 DIAGNOSIS — S8011XA Contusion of right lower leg, initial encounter: Secondary | ICD-10-CM | POA: Insufficient documentation

## 2016-06-08 DIAGNOSIS — S7001XA Contusion of right hip, initial encounter: Secondary | ICD-10-CM

## 2016-06-08 DIAGNOSIS — Y999 Unspecified external cause status: Secondary | ICD-10-CM | POA: Insufficient documentation

## 2016-06-08 DIAGNOSIS — S79911A Unspecified injury of right hip, initial encounter: Secondary | ICD-10-CM | POA: Diagnosis not present

## 2016-06-08 DIAGNOSIS — E119 Type 2 diabetes mellitus without complications: Secondary | ICD-10-CM | POA: Insufficient documentation

## 2016-06-08 DIAGNOSIS — W19XXXA Unspecified fall, initial encounter: Secondary | ICD-10-CM

## 2016-06-08 DIAGNOSIS — M25551 Pain in right hip: Secondary | ICD-10-CM | POA: Diagnosis not present

## 2016-06-08 DIAGNOSIS — S8991XA Unspecified injury of right lower leg, initial encounter: Secondary | ICD-10-CM | POA: Diagnosis present

## 2016-06-08 DIAGNOSIS — W010XXA Fall on same level from slipping, tripping and stumbling without subsequent striking against object, initial encounter: Secondary | ICD-10-CM | POA: Insufficient documentation

## 2016-06-08 DIAGNOSIS — I110 Hypertensive heart disease with heart failure: Secondary | ICD-10-CM | POA: Diagnosis not present

## 2016-06-08 DIAGNOSIS — Y939 Activity, unspecified: Secondary | ICD-10-CM | POA: Insufficient documentation

## 2016-06-08 DIAGNOSIS — E669 Obesity, unspecified: Secondary | ICD-10-CM | POA: Diagnosis not present

## 2016-06-08 DIAGNOSIS — I10 Essential (primary) hypertension: Secondary | ICD-10-CM | POA: Diagnosis not present

## 2016-06-08 DIAGNOSIS — E1165 Type 2 diabetes mellitus with hyperglycemia: Secondary | ICD-10-CM | POA: Diagnosis not present

## 2016-06-08 DIAGNOSIS — Z87891 Personal history of nicotine dependence: Secondary | ICD-10-CM | POA: Insufficient documentation

## 2016-06-08 DIAGNOSIS — Y929 Unspecified place or not applicable: Secondary | ICD-10-CM | POA: Diagnosis not present

## 2016-06-08 DIAGNOSIS — M545 Low back pain: Secondary | ICD-10-CM | POA: Diagnosis not present

## 2016-06-08 DIAGNOSIS — M25561 Pain in right knee: Secondary | ICD-10-CM | POA: Diagnosis not present

## 2016-06-08 DIAGNOSIS — S8001XA Contusion of right knee, initial encounter: Secondary | ICD-10-CM | POA: Insufficient documentation

## 2016-06-08 MED ORDER — OXYCODONE-ACETAMINOPHEN 5-325 MG PO TABS
1.0000 | ORAL_TABLET | Freq: Once | ORAL | Status: DC
  Filled 2016-06-08: qty 1

## 2016-06-08 NOTE — ED Triage Notes (Signed)
Pt states slipped on ice at 1900 today. Pt states has right knee pain, right hip pain, low back pain, right elbow and forearm pain. Pt denies head injury of loc. Cms intact to all extremities.
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
## 2016-06-09 MED ORDER — BACLOFEN 10 MG PO TABS: 10.0000 mg | ORAL_TABLET | Freq: Three times a day (TID) | ORAL | 0 refills | Status: DC

## 2016-06-11 ENCOUNTER — Ambulatory Visit: Payer: Managed Care, Other (non HMO) | Admitting: Interventional Cardiology

## 2016-06-12 DIAGNOSIS — L84 Corns and callosities: Secondary | ICD-10-CM | POA: Diagnosis not present

## 2016-06-12 DIAGNOSIS — E1351 Other specified diabetes mellitus with diabetic peripheral angiopathy without gangrene: Secondary | ICD-10-CM | POA: Diagnosis not present

## 2016-06-12 DIAGNOSIS — L602 Onychogryphosis: Secondary | ICD-10-CM | POA: Diagnosis not present

## 2016-06-14 ENCOUNTER — Ambulatory Visit: Payer: Managed Care, Other (non HMO) | Admitting: Family

## 2016-06-15 DIAGNOSIS — I509 Heart failure, unspecified: Secondary | ICD-10-CM | POA: Diagnosis not present

## 2016-06-15 DIAGNOSIS — R072 Precordial pain: Secondary | ICD-10-CM | POA: Diagnosis not present

## 2016-06-22 DIAGNOSIS — I2510 Atherosclerotic heart disease of native coronary artery without angina pectoris: Secondary | ICD-10-CM | POA: Diagnosis not present

## 2016-06-22 DIAGNOSIS — E785 Hyperlipidemia, unspecified: Secondary | ICD-10-CM | POA: Diagnosis not present

## 2016-06-22 DIAGNOSIS — E669 Obesity, unspecified: Secondary | ICD-10-CM | POA: Diagnosis not present

## 2016-06-22 DIAGNOSIS — I10 Essential (primary) hypertension: Secondary | ICD-10-CM | POA: Diagnosis not present

## 2016-06-22 DIAGNOSIS — E1165 Type 2 diabetes mellitus with hyperglycemia: Secondary | ICD-10-CM | POA: Diagnosis not present

## 2016-06-29 DIAGNOSIS — M25551 Pain in right hip: Secondary | ICD-10-CM | POA: Diagnosis not present

## 2016-06-29 DIAGNOSIS — G894 Chronic pain syndrome: Secondary | ICD-10-CM | POA: Diagnosis not present

## 2016-06-29 DIAGNOSIS — G890 Central pain syndrome: Secondary | ICD-10-CM | POA: Diagnosis not present

## 2016-06-29 DIAGNOSIS — M5416 Radiculopathy, lumbar region: Secondary | ICD-10-CM | POA: Diagnosis not present

## 2016-06-29 DIAGNOSIS — M79662 Pain in left lower leg: Secondary | ICD-10-CM | POA: Diagnosis not present

## 2016-06-29 DIAGNOSIS — Z79891 Long term (current) use of opiate analgesic: Secondary | ICD-10-CM | POA: Diagnosis not present

## 2016-06-29 DIAGNOSIS — M25552 Pain in left hip: Secondary | ICD-10-CM | POA: Diagnosis not present

## 2016-07-05 DIAGNOSIS — H26491 Other secondary cataract, right eye: Secondary | ICD-10-CM | POA: Diagnosis not present

## 2016-07-05 DIAGNOSIS — Z961 Presence of intraocular lens: Secondary | ICD-10-CM | POA: Diagnosis not present

## 2016-07-05 DIAGNOSIS — H40013 Open angle with borderline findings, low risk, bilateral: Secondary | ICD-10-CM | POA: Diagnosis not present

## 2016-07-05 DIAGNOSIS — E119 Type 2 diabetes mellitus without complications: Secondary | ICD-10-CM | POA: Diagnosis not present

## 2016-07-06 DIAGNOSIS — E1165 Type 2 diabetes mellitus with hyperglycemia: Secondary | ICD-10-CM | POA: Diagnosis not present

## 2016-07-06 DIAGNOSIS — E669 Obesity, unspecified: Secondary | ICD-10-CM | POA: Diagnosis not present

## 2016-07-06 DIAGNOSIS — I10 Essential (primary) hypertension: Secondary | ICD-10-CM | POA: Diagnosis not present

## 2016-07-10 ENCOUNTER — Emergency Department: Payer: Managed Care, Other (non HMO)

## 2016-07-10 ENCOUNTER — Emergency Department
Admission: EM | Admit: 2016-07-10 | Discharge: 2016-07-10 | Disposition: A | Discharge status: Home / Self Care | Payer: Managed Care, Other (non HMO) | Attending: Emergency Medicine | Admitting: Emergency Medicine

## 2016-07-10 ENCOUNTER — Encounter: Payer: Self-pay | Admitting: Emergency Medicine

## 2016-07-10 DIAGNOSIS — I5032 Chronic diastolic (congestive) heart failure: Secondary | ICD-10-CM | POA: Diagnosis not present

## 2016-07-10 DIAGNOSIS — R002 Palpitations: Secondary | ICD-10-CM

## 2016-07-10 DIAGNOSIS — Z794 Long term (current) use of insulin: Secondary | ICD-10-CM | POA: Insufficient documentation

## 2016-07-10 DIAGNOSIS — R079 Chest pain, unspecified: Secondary | ICD-10-CM | POA: Diagnosis not present

## 2016-07-10 DIAGNOSIS — I2510 Atherosclerotic heart disease of native coronary artery without angina pectoris: Secondary | ICD-10-CM | POA: Insufficient documentation

## 2016-07-10 DIAGNOSIS — R0789 Other chest pain: Secondary | ICD-10-CM | POA: Diagnosis present

## 2016-07-10 DIAGNOSIS — Z7982 Long term (current) use of aspirin: Secondary | ICD-10-CM | POA: Insufficient documentation

## 2016-07-10 DIAGNOSIS — I110 Hypertensive heart disease with heart failure: Secondary | ICD-10-CM | POA: Insufficient documentation

## 2016-07-10 DIAGNOSIS — Z79899 Other long term (current) drug therapy: Secondary | ICD-10-CM | POA: Insufficient documentation

## 2016-07-10 DIAGNOSIS — I471 Supraventricular tachycardia: Secondary | ICD-10-CM | POA: Insufficient documentation

## 2016-07-10 LAB — CBC
HCT: 43.8 % (ref 40.0–52.0)
HEMOGLOBIN: 15.1 g/dL (ref 13.0–18.0)
MCH: 28.8 pg (ref 26.0–34.0)
MCHC: 34.4 g/dL (ref 32.0–36.0)
MCV: 83.8 fL (ref 80.0–100.0)
Platelets: 234 10*3/uL (ref 150–440)
RBC: 5.23 MIL/uL (ref 4.40–5.90)
RDW: 14.3 % (ref 11.5–14.5)
WBC: 9.1 10*3/uL (ref 3.8–10.6)

## 2016-07-10 LAB — BASIC METABOLIC PANEL
ANION GAP: 8 (ref 5–15)
BUN: 16 mg/dL (ref 6–20)
CALCIUM: 8.6 mg/dL — AB (ref 8.9–10.3)
CO2: 27 mmol/L (ref 22–32)
Chloride: 102 mmol/L (ref 101–111)
Creatinine, Ser: 1.44 mg/dL — ABNORMAL HIGH (ref 0.61–1.24)
GFR calc Af Amer: 57 mL/min — ABNORMAL LOW (ref >60)
GFR calc non Af Amer: 50 mL/min — ABNORMAL LOW (ref >60)
GLUCOSE: 307 mg/dL — AB (ref 65–99)
Potassium: 3.8 mmol/L (ref 3.5–5.1)
Sodium: 137 mmol/L (ref 135–145)

## 2016-07-10 LAB — TSH: TSH: 0.744 u[IU]/mL (ref 0.350–4.500)

## 2016-07-10 LAB — TROPONIN I
Troponin I: <0.03 ng/mL (ref <0.03)
Troponin I: <0.03 ng/mL (ref <0.03)

## 2016-07-10 LAB — MAGNESIUM: Magnesium: 1.6 mg/dL — ABNORMAL LOW (ref 1.7–2.4)

## 2016-07-10 MED ORDER — MAGNESIUM SULFATE 2 GM/50ML IV SOLN
2.0000 g | Freq: Once | INTRAVENOUS | Status: AC
  Administered 2016-07-10: 2 g via INTRAVENOUS
  Filled 2016-07-10: qty 50

## 2016-07-10 MED ORDER — SODIUM CHLORIDE 0.9 % IV BOLUS (SEPSIS)
1000.0000 mL | Freq: Once | INTRAVENOUS | Status: AC
  Administered 2016-07-10: 1000 mL via INTRAVENOUS

## 2016-07-10 NOTE — ED Notes (Signed)
Patient transported to X-ray 

## 2016-07-10 NOTE — ED Triage Notes (Signed)
Pt arrived via ems from home after waking up with complaints of pressure and a sensation of having something stuck in his throat. Pt was in SVT upon ems arrival to home and pt was given 6 of adenosine and was converted from a 200 heart rate to 100. Pt in alert and oriented and in no apparent distress upon arrival.

## 2016-07-10 NOTE — Discharge Instructions (Signed)
Please follow-up with your cardiologist.
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
## 2016-07-10 NOTE — ED Notes (Signed)
Escorted pt in the [facility] to his awaiting vehicle   NAD observed

## 2016-07-13 DIAGNOSIS — E782 Mixed hyperlipidemia: Secondary | ICD-10-CM | POA: Diagnosis not present

## 2016-07-13 DIAGNOSIS — R609 Edema, unspecified: Secondary | ICD-10-CM | POA: Diagnosis not present

## 2016-07-13 DIAGNOSIS — R002 Palpitations: Secondary | ICD-10-CM | POA: Diagnosis not present

## 2016-07-13 DIAGNOSIS — I509 Heart failure, unspecified: Secondary | ICD-10-CM | POA: Diagnosis not present

## 2016-07-13 DIAGNOSIS — I471 Supraventricular tachycardia: Secondary | ICD-10-CM | POA: Diagnosis not present

## 2016-07-13 DIAGNOSIS — I2510 Atherosclerotic heart disease of native coronary artery without angina pectoris: Secondary | ICD-10-CM | POA: Diagnosis not present

## 2016-07-13 DIAGNOSIS — I10 Essential (primary) hypertension: Secondary | ICD-10-CM | POA: Diagnosis not present

## 2016-07-13 DIAGNOSIS — R0602 Shortness of breath: Secondary | ICD-10-CM | POA: Diagnosis not present

## 2016-07-20 DIAGNOSIS — E669 Obesity, unspecified: Secondary | ICD-10-CM | POA: Diagnosis not present

## 2016-07-20 DIAGNOSIS — E559 Vitamin D deficiency, unspecified: Secondary | ICD-10-CM | POA: Diagnosis not present

## 2016-07-20 DIAGNOSIS — E1165 Type 2 diabetes mellitus with hyperglycemia: Secondary | ICD-10-CM | POA: Diagnosis not present

## 2016-07-20 DIAGNOSIS — I10 Essential (primary) hypertension: Secondary | ICD-10-CM | POA: Diagnosis not present

## 2016-07-20 DIAGNOSIS — A084 Viral intestinal infection, unspecified: Secondary | ICD-10-CM | POA: Diagnosis not present

## 2016-07-25 DIAGNOSIS — R079 Chest pain, unspecified: Secondary | ICD-10-CM | POA: Diagnosis not present

## 2016-07-27 DIAGNOSIS — Z79891 Long term (current) use of opiate analgesic: Secondary | ICD-10-CM | POA: Diagnosis not present

## 2016-07-27 DIAGNOSIS — M79662 Pain in left lower leg: Secondary | ICD-10-CM | POA: Diagnosis not present

## 2016-07-27 DIAGNOSIS — G894 Chronic pain syndrome: Secondary | ICD-10-CM | POA: Diagnosis not present

## 2016-07-27 DIAGNOSIS — M5416 Radiculopathy, lumbar region: Secondary | ICD-10-CM | POA: Diagnosis not present

## 2016-07-27 DIAGNOSIS — M25551 Pain in right hip: Secondary | ICD-10-CM | POA: Diagnosis not present

## 2016-07-27 DIAGNOSIS — G890 Central pain syndrome: Secondary | ICD-10-CM | POA: Diagnosis not present

## 2016-07-27 DIAGNOSIS — M25552 Pain in left hip: Secondary | ICD-10-CM | POA: Diagnosis not present

## 2016-08-01 DIAGNOSIS — J069 Acute upper respiratory infection, unspecified: Secondary | ICD-10-CM | POA: Diagnosis not present

## 2016-08-01 DIAGNOSIS — I10 Essential (primary) hypertension: Secondary | ICD-10-CM | POA: Diagnosis not present

## 2016-08-01 DIAGNOSIS — E1165 Type 2 diabetes mellitus with hyperglycemia: Secondary | ICD-10-CM | POA: Diagnosis not present

## 2016-08-01 DIAGNOSIS — E559 Vitamin D deficiency, unspecified: Secondary | ICD-10-CM | POA: Diagnosis not present

## 2016-08-01 DIAGNOSIS — E669 Obesity, unspecified: Secondary | ICD-10-CM | POA: Diagnosis not present

## 2016-08-01 DIAGNOSIS — R05 Cough: Secondary | ICD-10-CM | POA: Diagnosis not present

## 2016-08-08 DIAGNOSIS — I10 Essential (primary) hypertension: Secondary | ICD-10-CM | POA: Diagnosis not present

## 2016-08-08 DIAGNOSIS — R0602 Shortness of breath: Secondary | ICD-10-CM | POA: Diagnosis not present

## 2016-08-08 DIAGNOSIS — I471 Supraventricular tachycardia: Secondary | ICD-10-CM | POA: Diagnosis not present

## 2016-08-08 DIAGNOSIS — E785 Hyperlipidemia, unspecified: Secondary | ICD-10-CM | POA: Diagnosis not present

## 2016-08-08 DIAGNOSIS — I509 Heart failure, unspecified: Secondary | ICD-10-CM | POA: Diagnosis not present

## 2016-08-08 DIAGNOSIS — I2510 Atherosclerotic heart disease of native coronary artery without angina pectoris: Secondary | ICD-10-CM | POA: Diagnosis not present

## 2016-08-20 DIAGNOSIS — I2510 Atherosclerotic heart disease of native coronary artery without angina pectoris: Secondary | ICD-10-CM | POA: Diagnosis not present

## 2016-08-20 DIAGNOSIS — R609 Edema, unspecified: Secondary | ICD-10-CM | POA: Diagnosis not present

## 2016-08-20 DIAGNOSIS — I509 Heart failure, unspecified: Secondary | ICD-10-CM | POA: Diagnosis not present

## 2016-08-20 DIAGNOSIS — E782 Mixed hyperlipidemia: Secondary | ICD-10-CM | POA: Diagnosis not present

## 2016-08-20 DIAGNOSIS — R0602 Shortness of breath: Secondary | ICD-10-CM | POA: Diagnosis not present

## 2016-08-20 DIAGNOSIS — I10 Essential (primary) hypertension: Secondary | ICD-10-CM | POA: Diagnosis not present

## 2016-08-29 DIAGNOSIS — E119 Type 2 diabetes mellitus without complications: Secondary | ICD-10-CM | POA: Diagnosis not present

## 2016-08-29 DIAGNOSIS — Z125 Encounter for screening for malignant neoplasm of prostate: Secondary | ICD-10-CM | POA: Diagnosis not present

## 2016-08-29 DIAGNOSIS — E559 Vitamin D deficiency, unspecified: Secondary | ICD-10-CM | POA: Diagnosis not present

## 2016-08-29 DIAGNOSIS — E1165 Type 2 diabetes mellitus with hyperglycemia: Secondary | ICD-10-CM | POA: Diagnosis not present

## 2016-08-29 DIAGNOSIS — E669 Obesity, unspecified: Secondary | ICD-10-CM | POA: Diagnosis not present

## 2016-08-29 DIAGNOSIS — Z1329 Encounter for screening for other suspected endocrine disorder: Secondary | ICD-10-CM | POA: Diagnosis not present

## 2016-08-29 DIAGNOSIS — I10 Essential (primary) hypertension: Secondary | ICD-10-CM | POA: Diagnosis not present

## 2016-09-04 DIAGNOSIS — B351 Tinea unguium: Secondary | ICD-10-CM | POA: Diagnosis not present

## 2016-09-04 DIAGNOSIS — E139 Other specified diabetes mellitus without complications: Secondary | ICD-10-CM | POA: Diagnosis not present

## 2016-09-04 DIAGNOSIS — M79674 Pain in right toe(s): Secondary | ICD-10-CM | POA: Diagnosis not present

## 2016-09-04 DIAGNOSIS — L84 Corns and callosities: Secondary | ICD-10-CM | POA: Diagnosis not present

## 2016-09-04 DIAGNOSIS — M79675 Pain in left toe(s): Secondary | ICD-10-CM | POA: Diagnosis not present

## 2016-09-04 DIAGNOSIS — L602 Onychogryphosis: Secondary | ICD-10-CM | POA: Diagnosis not present

## 2016-09-19 DIAGNOSIS — E669 Obesity, unspecified: Secondary | ICD-10-CM | POA: Diagnosis not present

## 2016-09-19 DIAGNOSIS — E119 Type 2 diabetes mellitus without complications: Secondary | ICD-10-CM | POA: Diagnosis not present

## 2016-09-19 DIAGNOSIS — I10 Essential (primary) hypertension: Secondary | ICD-10-CM | POA: Diagnosis not present

## 2016-09-24 DIAGNOSIS — M79604 Pain in right leg: Secondary | ICD-10-CM | POA: Diagnosis not present

## 2016-09-24 DIAGNOSIS — M79662 Pain in left lower leg: Secondary | ICD-10-CM | POA: Diagnosis not present

## 2016-09-24 DIAGNOSIS — M25561 Pain in right knee: Secondary | ICD-10-CM | POA: Diagnosis not present

## 2016-09-24 DIAGNOSIS — M79605 Pain in left leg: Secondary | ICD-10-CM | POA: Diagnosis not present

## 2016-09-24 DIAGNOSIS — M25562 Pain in left knee: Secondary | ICD-10-CM | POA: Diagnosis not present

## 2016-09-24 DIAGNOSIS — M79661 Pain in right lower leg: Secondary | ICD-10-CM | POA: Diagnosis not present

## 2016-09-24 DIAGNOSIS — M545 Low back pain: Secondary | ICD-10-CM | POA: Diagnosis not present

## 2016-09-24 DIAGNOSIS — G894 Chronic pain syndrome: Secondary | ICD-10-CM | POA: Diagnosis not present

## 2016-10-23 DIAGNOSIS — M79605 Pain in left leg: Secondary | ICD-10-CM | POA: Diagnosis not present

## 2016-10-23 DIAGNOSIS — M545 Low back pain: Secondary | ICD-10-CM | POA: Diagnosis not present

## 2016-10-23 DIAGNOSIS — M25562 Pain in left knee: Secondary | ICD-10-CM | POA: Diagnosis not present

## 2016-10-23 DIAGNOSIS — M25561 Pain in right knee: Secondary | ICD-10-CM | POA: Diagnosis not present

## 2016-10-23 DIAGNOSIS — M79661 Pain in right lower leg: Secondary | ICD-10-CM | POA: Diagnosis not present

## 2016-10-23 DIAGNOSIS — G894 Chronic pain syndrome: Secondary | ICD-10-CM | POA: Diagnosis not present

## 2016-10-23 DIAGNOSIS — M79662 Pain in left lower leg: Secondary | ICD-10-CM | POA: Diagnosis not present

## 2016-10-23 DIAGNOSIS — M79604 Pain in right leg: Secondary | ICD-10-CM | POA: Diagnosis not present

## 2016-12-12 ENCOUNTER — Encounter: Payer: Self-pay | Admitting: Emergency Medicine

## 2016-12-12 DIAGNOSIS — Z5321 Procedure and treatment not carried out due to patient leaving prior to being seen by health care provider: Secondary | ICD-10-CM | POA: Insufficient documentation

## 2016-12-12 DIAGNOSIS — R1907 Generalized intra-abdominal and pelvic swelling, mass and lump: Secondary | ICD-10-CM | POA: Diagnosis not present

## 2016-12-12 DIAGNOSIS — M545 Low back pain: Secondary | ICD-10-CM | POA: Insufficient documentation

## 2016-12-12 LAB — URINALYSIS, COMPLETE (UACMP) WITH MICROSCOPIC
Bacteria, UA: NONE SEEN
Bilirubin Urine: NEGATIVE
HGB URINE DIPSTICK: NEGATIVE
Ketones, ur: NEGATIVE mg/dL
Leukocytes, UA: NEGATIVE
NITRITE: NEGATIVE
PH: 5 (ref 5.0–8.0)
Protein, ur: NEGATIVE mg/dL
RBC / HPF: NONE SEEN RBC/hpf (ref 0–5)
Specific Gravity, Urine: 1.022 (ref 1.005–1.030)

## 2016-12-12 NOTE — ED Triage Notes (Signed)
Pt. States traverse lower lumber pain that started in the a.m. Today.   Pt. Also reports lt. Abdominal swelling.  Pt. Reports normal bowel movement today.

## 2016-12-13 ENCOUNTER — Telehealth: Payer: Self-pay | Admitting: Emergency Medicine

## 2016-12-13 ENCOUNTER — Emergency Department
Admission: EM | Admit: 2016-12-13 | Discharge: 2016-12-13 | Disposition: A | Discharge status: Home / Self Care | Payer: Managed Care, Other (non HMO) | Attending: Emergency Medicine | Admitting: Emergency Medicine

## 2016-12-13 NOTE — ED Notes (Signed)
No answer when called from lobby several times 

## 2016-12-13 NOTE — Telephone Encounter (Signed)
Called patient due to lwot to inquire about condition and follow up plans. Says he went to an urgent care.

## 2017-01-04 ENCOUNTER — Emergency Department: Payer: Managed Care, Other (non HMO)

## 2017-01-04 ENCOUNTER — Encounter: Payer: Self-pay | Admitting: Emergency Medicine

## 2017-01-04 ENCOUNTER — Emergency Department
Admission: EM | Admit: 2017-01-04 | Discharge: 2017-01-04 | Disposition: A | Discharge status: Home / Self Care | Payer: Managed Care, Other (non HMO) | Attending: Emergency Medicine | Admitting: Emergency Medicine

## 2017-01-04 ENCOUNTER — Emergency Department
Admission: EM | Admit: 2017-01-04 | Discharge: 2017-01-04 | Disposition: A | Discharge status: Home / Self Care | Payer: Managed Care, Other (non HMO) | Source: Home / Self Care | Attending: Emergency Medicine | Admitting: Emergency Medicine

## 2017-01-04 DIAGNOSIS — Z7902 Long term (current) use of antithrombotics/antiplatelets: Secondary | ICD-10-CM | POA: Diagnosis not present

## 2017-01-04 DIAGNOSIS — I2510 Atherosclerotic heart disease of native coronary artery without angina pectoris: Secondary | ICD-10-CM | POA: Insufficient documentation

## 2017-01-04 DIAGNOSIS — Z96651 Presence of right artificial knee joint: Secondary | ICD-10-CM

## 2017-01-04 DIAGNOSIS — Z7982 Long term (current) use of aspirin: Secondary | ICD-10-CM | POA: Insufficient documentation

## 2017-01-04 DIAGNOSIS — Z79899 Other long term (current) drug therapy: Secondary | ICD-10-CM | POA: Insufficient documentation

## 2017-01-04 DIAGNOSIS — E119 Type 2 diabetes mellitus without complications: Secondary | ICD-10-CM | POA: Insufficient documentation

## 2017-01-04 DIAGNOSIS — Z96652 Presence of left artificial knee joint: Secondary | ICD-10-CM

## 2017-01-04 DIAGNOSIS — I5032 Chronic diastolic (congestive) heart failure: Secondary | ICD-10-CM | POA: Insufficient documentation

## 2017-01-04 DIAGNOSIS — Z87891 Personal history of nicotine dependence: Secondary | ICD-10-CM | POA: Insufficient documentation

## 2017-01-04 DIAGNOSIS — I110 Hypertensive heart disease with heart failure: Secondary | ICD-10-CM | POA: Insufficient documentation

## 2017-01-04 DIAGNOSIS — I252 Old myocardial infarction: Secondary | ICD-10-CM | POA: Insufficient documentation

## 2017-01-04 DIAGNOSIS — Z794 Long term (current) use of insulin: Secondary | ICD-10-CM | POA: Insufficient documentation

## 2017-01-04 DIAGNOSIS — R0789 Other chest pain: Secondary | ICD-10-CM | POA: Diagnosis present

## 2017-01-04 DIAGNOSIS — Z955 Presence of coronary angioplasty implant and graft: Secondary | ICD-10-CM | POA: Insufficient documentation

## 2017-01-04 DIAGNOSIS — R002 Palpitations: Secondary | ICD-10-CM | POA: Insufficient documentation

## 2017-01-04 DIAGNOSIS — I471 Supraventricular tachycardia: Secondary | ICD-10-CM

## 2017-01-04 HISTORY — DX: Supraventricular tachycardia: I47.1

## 2017-01-04 HISTORY — DX: Supraventricular tachycardia, unspecified: I47.10

## 2017-01-04 LAB — COMPREHENSIVE METABOLIC PANEL
ALBUMIN: 3.6 g/dL (ref 3.5–5.0)
ALK PHOS: 65 U/L (ref 38–126)
ALT: 22 U/L (ref 17–63)
ANION GAP: 11 (ref 5–15)
AST: 38 U/L (ref 15–41)
BILIRUBIN TOTAL: 1.3 mg/dL — AB (ref 0.3–1.2)
BUN: 23 mg/dL — ABNORMAL HIGH (ref 6–20)
CALCIUM: 8.5 mg/dL — AB (ref 8.9–10.3)
CO2: 24 mmol/L (ref 22–32)
Chloride: 97 mmol/L — ABNORMAL LOW (ref 101–111)
Creatinine, Ser: 1.36 mg/dL — ABNORMAL HIGH (ref 0.61–1.24)
GFR calc Af Amer: >60 mL/min (ref >60)
GFR, EST NON AFRICAN AMERICAN: 53 mL/min — AB (ref >60)
GLUCOSE: 376 mg/dL — AB (ref 65–99)
Potassium: 3.4 mmol/L — ABNORMAL LOW (ref 3.5–5.1)
Sodium: 132 mmol/L — ABNORMAL LOW (ref 135–145)
TOTAL PROTEIN: 7.1 g/dL (ref 6.5–8.1)

## 2017-01-04 LAB — CBC
HEMATOCRIT: 46.7 % (ref 40.0–52.0)
HEMOGLOBIN: 16.1 g/dL (ref 13.0–18.0)
MCH: 28.6 pg (ref 26.0–34.0)
MCHC: 34.5 g/dL (ref 32.0–36.0)
MCV: 82.8 fL (ref 80.0–100.0)
Platelets: 200 10*3/uL (ref 150–440)
RBC: 5.65 MIL/uL (ref 4.40–5.90)
RDW: 13.5 % (ref 11.5–14.5)
WBC: 8.7 10*3/uL (ref 3.8–10.6)

## 2017-01-04 LAB — BASIC METABOLIC PANEL
ANION GAP: 14 (ref 5–15)
BUN: 24 mg/dL — AB (ref 6–20)
CHLORIDE: 93 mmol/L — AB (ref 101–111)
CO2: 25 mmol/L (ref 22–32)
Calcium: 8.8 mg/dL — ABNORMAL LOW (ref 8.9–10.3)
Creatinine, Ser: 1.51 mg/dL — ABNORMAL HIGH (ref 0.61–1.24)
GFR calc Af Amer: 54 mL/min — ABNORMAL LOW (ref >60)
GFR calc non Af Amer: 46 mL/min — ABNORMAL LOW (ref >60)
GLUCOSE: 417 mg/dL — AB (ref 65–99)
POTASSIUM: 2.9 mmol/L — AB (ref 3.5–5.1)
Sodium: 132 mmol/L — ABNORMAL LOW (ref 135–145)

## 2017-01-04 LAB — CBC WITH DIFFERENTIAL/PLATELET
BASOS ABS: 0.1 10*3/uL (ref 0–0.1)
BASOS PCT: 1 %
Eosinophils Absolute: 0 10*3/uL (ref 0–0.7)
Eosinophils Relative: 0 %
HEMATOCRIT: 45.5 % (ref 40.0–52.0)
HEMOGLOBIN: 15.5 g/dL (ref 13.0–18.0)
LYMPHS PCT: 27 %
Lymphs Abs: 2.7 10*3/uL (ref 1.0–3.6)
MCH: 28.6 pg (ref 26.0–34.0)
MCHC: 34.1 g/dL (ref 32.0–36.0)
MCV: 83.6 fL (ref 80.0–100.0)
MONO ABS: 0.8 10*3/uL (ref 0.2–1.0)
Monocytes Relative: 8 %
NEUTROS ABS: 6.2 10*3/uL (ref 1.4–6.5)
NEUTROS PCT: 64 %
Platelets: 215 10*3/uL (ref 150–440)
RBC: 5.44 MIL/uL (ref 4.40–5.90)
RDW: 13.5 % (ref 11.5–14.5)
WBC: 9.8 10*3/uL (ref 3.8–10.6)

## 2017-01-04 LAB — GLUCOSE, CAPILLARY
GLUCOSE-CAPILLARY: 577 mg/dL — AB (ref 65–99)
GLUCOSE-CAPILLARY: 505 mg/dL — AB (ref 65–99)
GLUCOSE-CAPILLARY: 392 mg/dL — AB (ref 65–99)

## 2017-01-04 LAB — TROPONIN I
Troponin I: <0.03 ng/mL (ref <0.03)
Troponin I: <0.03 ng/mL (ref <0.03)

## 2017-01-04 LAB — URINALYSIS, COMPLETE (UACMP) WITH MICROSCOPIC
BACTERIA UA: NONE SEEN
BILIRUBIN URINE: NEGATIVE
HGB URINE DIPSTICK: NEGATIVE
Ketones, ur: NEGATIVE mg/dL
LEUKOCYTES UA: NEGATIVE
NITRITE: NEGATIVE
PH: 7 (ref 5.0–8.0)
Protein, ur: NEGATIVE mg/dL
Specific Gravity, Urine: 1.025 (ref 1.005–1.030)
Squamous Epithelial / LPF: NONE SEEN

## 2017-01-04 MED ORDER — INSULIN ASPART 100 UNIT/ML ~~LOC~~ SOLN
5.0000 [IU] | Freq: Once | SUBCUTANEOUS | Status: AC
  Administered 2017-01-04: 5 [IU] via INTRAVENOUS
  Filled 2017-01-04: qty 1

## 2017-01-04 MED ORDER — DILTIAZEM HCL ER COATED BEADS 120 MG PO CP24
120.0000 mg | ORAL_CAPSULE | Freq: Every day | ORAL | Status: DC
  Administered 2017-01-04: 120 mg via ORAL
  Filled 2017-01-04: qty 1

## 2017-01-04 MED ORDER — ADENOSINE 6 MG/2ML IV SOLN
6.0000 mg | Freq: Once | INTRAVENOUS | Status: AC
  Administered 2017-01-04: 6 mg via INTRAVENOUS

## 2017-01-04 MED ORDER — DILTIAZEM HCL ER COATED BEADS 120 MG PO CP24: 120.0000 mg | ORAL_CAPSULE | Freq: Every day | ORAL | 11 refills | Status: DC

## 2017-01-04 MED ORDER — SODIUM CHLORIDE 0.9 % IV BOLUS (SEPSIS)
1000.0000 mL | Freq: Once | INTRAVENOUS | Status: AC
  Administered 2017-01-04: 1000 mL via INTRAVENOUS

## 2017-01-04 MED ORDER — ADENOSINE 12 MG/4ML IV SOLN: 12.0000 mg | Freq: Once | INTRAVENOUS | Status: DC

## 2017-01-04 MED ORDER — ADENOSINE 6 MG/2ML IV SOLN
INTRAVENOUS | Status: AC
Start: 2017-01-04 — End: 2017-01-04
  Administered 2017-01-04: 6 mg via INTRAVENOUS
  Filled 2017-01-04: qty 2

## 2017-01-04 MED ORDER — INSULIN LISPRO 100 UNIT/ML ~~LOC~~ SOLN: SUBCUTANEOUS | 3 refills | Status: DC

## 2017-01-04 MED ORDER — ADENOSINE 12 MG/4ML IV SOLN
INTRAVENOUS | Status: AC
  Filled 2017-01-04: qty 4

## 2017-01-04 MED ORDER — SODIUM CHLORIDE 0.9 % IV BOLUS (SEPSIS)
500.0000 mL | Freq: Once | INTRAVENOUS | Status: AC
  Administered 2017-01-04: 500 mL via INTRAVENOUS

## 2017-01-04 NOTE — ED Notes (Signed)
CBG- 505 

## 2017-01-04 NOTE — ED Notes (Signed)
Pt alert and oriented, NAD. No pain. No needs at this time. Respirations unlabored.

## 2017-01-04 NOTE — ED Notes (Signed)
Given water

## 2017-01-04 NOTE — Discharge Instructions (Signed)
Please take 2 of your metoprolol pills in the morning as we discussed. Follow up with Dr. Humphrey Rolls early next week

## 2017-01-04 NOTE — ED Notes (Signed)
Pt denies chest pain at this time.

## 2017-01-04 NOTE — ED Triage Notes (Signed)
Arrives with C/O chest pain.  States returns to ER with c/o chest pain.

## 2017-01-04 NOTE — ED Triage Notes (Signed)
Pt presents to ED 10 via EMS from home with c/o SVT; per EMS pt's HR was in the 180s, pt was given 6 of adenosine and HR came down to 95-97; per EMS cardiac strip shown NSR with periods of Bigeminy and Trigeminy; at this time pt does not c/o any chest pain, no vision changes, any LOC, nausea, vomiting, or diarrhea, no light headedness, dizziness; EMS states pt tested his CBG this morning and it was reported to be 468; at this time pt is awake, alert and oriented x4.
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
## 2017-01-04 NOTE — ED Notes (Signed)
ED Provider at bedside. 
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
## 2017-01-04 NOTE — Progress Notes (Signed)
Inpatient Diabetes Program Recommendations  AACE/ADA: New Consensus Statement on Inpatient Glycemic Control (2015)  Target Ranges:  Prepandial:   less than 140 mg/dL      Peak postprandial:   less than 180 mg/dL (1-2 hours)      Critically ill patients:  140 - 180 mg/dL   Lab Results  Component Value Date   GLUCAP 223 (H) 05/18/2016   HGBA1C 10.0 (H) 05/17/2016    Review of Glycemic Control- lab glucose  Results for AZION, CENTRELLA (MRN [id number]) as of 01/04/2017 09:06  Ref. Range 01/04/2017 06:48  Glucose Latest Ref Range: 65 - 99 mg/dL 417 (H)    Diabetes history: Type 2 Outpatient Diabetes medications: Lantus 21 units qhs Current orders for Inpatient glycemic control: none  Inpatient Diabetes Program Recommendations:     Please consider adding CBG tid and hs and Novolog correction insulin 0-20 units tid , Novolog 0-5 units qhs  Consider adding Lantus 15 units qhs   Per ADA recommendations "consider performing an A1C on all patients with diabetes or hyperglycemia admitted to the hospital if not performed in the prior 3 months".  Gentry Fitz, RN, BA, MHA, CDE Diabetes Coordinator Inpatient Diabetes Program  [phone] (Team Pager) [phone] ([place]) 01/04/2017 9:09 AM

## 2017-01-04 NOTE — Discharge Instructions (Signed)
Please follow-up with your cardiologist as soon as possible. Stop the Norvasc and used still times MC D 120 mg once a day. I also refilled your Humalog pounds. Use them according to your doctor sliding scale. He is return for any further problems. If you get short of breath or bad chest pain call [phone].

## 2017-01-04 NOTE — ED Notes (Signed)
zoll hooked up to patient at this time

## 2017-01-17 ENCOUNTER — Other Ambulatory Visit: Payer: Self-pay | Admitting: Cardiovascular Disease

## 2017-01-17 ENCOUNTER — Other Ambulatory Visit: Payer: Self-pay | Admitting: Internal Medicine

## 2017-01-17 DIAGNOSIS — R1084 Generalized abdominal pain: Secondary | ICD-10-CM

## 2017-01-22 ENCOUNTER — Ambulatory Visit
Admission: RE | Admit: 2017-01-22 | Discharge: 2017-01-22 | Disposition: A | Discharge status: Home / Self Care | Payer: Managed Care, Other (non HMO) | Source: Ambulatory Visit | Attending: Internal Medicine | Admitting: Internal Medicine

## 2017-01-22 DIAGNOSIS — K8020 Calculus of gallbladder without cholecystitis without obstruction: Secondary | ICD-10-CM | POA: Insufficient documentation

## 2017-01-22 DIAGNOSIS — R109 Unspecified abdominal pain: Secondary | ICD-10-CM | POA: Diagnosis not present

## 2017-01-22 DIAGNOSIS — R1084 Generalized abdominal pain: Secondary | ICD-10-CM

## 2017-01-22 DIAGNOSIS — E1165 Type 2 diabetes mellitus with hyperglycemia: Secondary | ICD-10-CM | POA: Insufficient documentation

## 2017-01-24 ENCOUNTER — Ambulatory Visit: Admit: 2017-01-24 | Payer: Managed Care, Other (non HMO) | Admitting: Cardiovascular Disease

## 2017-01-24 SURGERY — LEFT HEART CATH
Anesthesia: Moderate Sedation | Laterality: Right

## 2017-01-25 ENCOUNTER — Observation Stay
Admission: RE | Admit: 2017-01-25 | Discharge: 2017-01-26 | Disposition: A | Discharge status: Home / Self Care | Payer: Managed Care, Other (non HMO) | Source: Ambulatory Visit | Attending: Internal Medicine | Admitting: Internal Medicine

## 2017-01-25 ENCOUNTER — Observation Stay: Payer: Managed Care, Other (non HMO)

## 2017-01-25 ENCOUNTER — Other Ambulatory Visit: Payer: Self-pay | Admitting: Cardiovascular Disease

## 2017-01-25 ENCOUNTER — Encounter: Payer: Self-pay | Admitting: *Deleted

## 2017-01-25 ENCOUNTER — Encounter
Admission: RE | Disposition: A | Discharge status: Home / Self Care | Payer: Self-pay | Source: Ambulatory Visit | Attending: Internal Medicine

## 2017-01-25 DIAGNOSIS — M1990 Unspecified osteoarthritis, unspecified site: Secondary | ICD-10-CM | POA: Diagnosis not present

## 2017-01-25 DIAGNOSIS — Z7982 Long term (current) use of aspirin: Secondary | ICD-10-CM | POA: Insufficient documentation

## 2017-01-25 DIAGNOSIS — I200 Unstable angina: Secondary | ICD-10-CM

## 2017-01-25 DIAGNOSIS — I5032 Chronic diastolic (congestive) heart failure: Secondary | ICD-10-CM | POA: Insufficient documentation

## 2017-01-25 DIAGNOSIS — I25110 Atherosclerotic heart disease of native coronary artery with unstable angina pectoris: Secondary | ICD-10-CM | POA: Insufficient documentation

## 2017-01-25 DIAGNOSIS — T82855A Stenosis of coronary artery stent, initial encounter: Secondary | ICD-10-CM | POA: Diagnosis not present

## 2017-01-25 DIAGNOSIS — Y838 Other surgical procedures as the cause of abnormal reaction of the patient, or of later complication, without mention of misadventure at the time of the procedure: Secondary | ICD-10-CM | POA: Diagnosis not present

## 2017-01-25 DIAGNOSIS — I471 Supraventricular tachycardia: Secondary | ICD-10-CM | POA: Diagnosis not present

## 2017-01-25 DIAGNOSIS — Z794 Long term (current) use of insulin: Secondary | ICD-10-CM | POA: Diagnosis not present

## 2017-01-25 DIAGNOSIS — I252 Old myocardial infarction: Secondary | ICD-10-CM | POA: Insufficient documentation

## 2017-01-25 DIAGNOSIS — Z7902 Long term (current) use of antithrombotics/antiplatelets: Secondary | ICD-10-CM | POA: Diagnosis not present

## 2017-01-25 DIAGNOSIS — E119 Type 2 diabetes mellitus without complications: Secondary | ICD-10-CM | POA: Diagnosis not present

## 2017-01-25 DIAGNOSIS — R51 Headache: Secondary | ICD-10-CM | POA: Diagnosis not present

## 2017-01-25 DIAGNOSIS — E785 Hyperlipidemia, unspecified: Secondary | ICD-10-CM | POA: Diagnosis not present

## 2017-01-25 DIAGNOSIS — H532 Diplopia: Secondary | ICD-10-CM

## 2017-01-25 DIAGNOSIS — I110 Hypertensive heart disease with heart failure: Secondary | ICD-10-CM | POA: Diagnosis not present

## 2017-01-25 HISTORY — PX: CORONARY STENT INTERVENTION: CATH118234

## 2017-01-25 HISTORY — PX: LEFT HEART CATH: CATH118248

## 2017-01-25 HISTORY — PX: CORONARY ANGIOGRAPHY: CATH118303

## 2017-01-25 LAB — GLUCOSE, CAPILLARY
GLUCOSE-CAPILLARY: 138 mg/dL — AB (ref 65–99)
Glucose-Capillary: 100 mg/dL — ABNORMAL HIGH (ref 65–99)
Glucose-Capillary: 243 mg/dL — ABNORMAL HIGH (ref 65–99)
Glucose-Capillary: 301 mg/dL — ABNORMAL HIGH (ref 65–99)

## 2017-01-25 LAB — POCT ACTIVATED CLOTTING TIME: Activated Clotting Time: 307 seconds

## 2017-01-25 SURGERY — LEFT HEART CATH
Anesthesia: Moderate Sedation | Laterality: Right

## 2017-01-25 SURGERY — LEFT HEART CATH
Anesthesia: Moderate Sedation

## 2017-01-25 MED ORDER — ALBUTEROL SULFATE HFA 108 (90 BASE) MCG/ACT IN AERS: 1.0000 | INHALATION_SPRAY | RESPIRATORY_TRACT | Status: DC | PRN

## 2017-01-25 MED ORDER — ONDANSETRON HCL 4 MG/2ML IJ SOLN: 4.0000 mg | Freq: Four times a day (QID) | INTRAMUSCULAR | Status: DC | PRN

## 2017-01-25 MED ORDER — SODIUM CHLORIDE 0.9 % IV SOLN: 250.0000 mL | INTRAVENOUS | Status: DC | PRN

## 2017-01-25 MED ORDER — LOSARTAN POTASSIUM 50 MG PO TABS
100.0000 mg | ORAL_TABLET | Freq: Every day | ORAL | Status: DC
  Administered 2017-01-26: 100 mg via ORAL
  Filled 2017-01-25: qty 2

## 2017-01-25 MED ORDER — TICAGRELOR 90 MG PO TABS
ORAL_TABLET | ORAL | Status: AC
  Filled 2017-01-25: qty 2

## 2017-01-25 MED ORDER — FENTANYL CITRATE (PF) 100 MCG/2ML IJ SOLN
INTRAMUSCULAR | Status: DC | PRN
  Administered 2017-01-25: 50 ug via INTRAVENOUS

## 2017-01-25 MED ORDER — SODIUM CHLORIDE 0.9% FLUSH: 3.0000 mL | INTRAVENOUS | Status: DC | PRN

## 2017-01-25 MED ORDER — METOPROLOL SUCCINATE ER 50 MG PO TB24
50.0000 mg | ORAL_TABLET | Freq: Every day | ORAL | Status: DC
  Administered 2017-01-25 – 2017-01-26 (×2): 50 mg via ORAL
  Filled 2017-01-25 (×2): qty 1

## 2017-01-25 MED ORDER — SODIUM CHLORIDE 0.9% FLUSH
3.0000 mL | Freq: Two times a day (BID) | INTRAVENOUS | Status: DC
Start: 2017-01-26 — End: 2017-01-25

## 2017-01-25 MED ORDER — LIDOCAINE HCL (PF) 1 % IJ SOLN
INTRAMUSCULAR | Status: AC
  Filled 2017-01-25: qty 30

## 2017-01-25 MED ORDER — SODIUM CHLORIDE 0.9 % WEIGHT BASED INFUSION: 1.0000 mL/kg/h | INTRAVENOUS | Status: DC

## 2017-01-25 MED ORDER — MIDAZOLAM HCL 2 MG/2ML IJ SOLN
INTRAMUSCULAR | Status: DC | PRN
  Administered 2017-01-25: 1 mg via INTRAVENOUS

## 2017-01-25 MED ORDER — NITROGLYCERIN 1 MG/10 ML FOR IR/CATH LAB
INTRA_ARTERIAL | Status: DC | PRN
  Administered 2017-01-25: 200 ug via INTRACORONARY

## 2017-01-25 MED ORDER — INSULIN ASPART 100 UNIT/ML ~~LOC~~ SOLN
0.0000 [IU] | Freq: Every day | SUBCUTANEOUS | Status: DC
  Administered 2017-01-25: 4 [IU] via SUBCUTANEOUS
  Filled 2017-01-25: qty 1

## 2017-01-25 MED ORDER — INSULIN GLARGINE 100 UNIT/ML SOLOSTAR PEN: 38.0000 [IU] | PEN_INJECTOR | Freq: Every day | SUBCUTANEOUS | Status: DC

## 2017-01-25 MED ORDER — ASPIRIN 81 MG PO CHEW
CHEWABLE_TABLET | ORAL | Status: AC
  Filled 2017-01-25: qty 3

## 2017-01-25 MED ORDER — INSULIN GLARGINE 100 UNIT/ML ~~LOC~~ SOLN
38.0000 [IU] | Freq: Every day | SUBCUTANEOUS | Status: DC
  Administered 2017-01-25: 38 [IU] via SUBCUTANEOUS
  Filled 2017-01-25 (×2): qty 0.38

## 2017-01-25 MED ORDER — HYDROCODONE-ACETAMINOPHEN 5-325 MG PO TABS
1.0000 | ORAL_TABLET | ORAL | Status: DC | PRN
  Administered 2017-01-25 – 2017-01-26 (×4): 1 via ORAL
  Filled 2017-01-25 (×4): qty 1

## 2017-01-25 MED ORDER — SODIUM CHLORIDE 0.9 % IV SOLN
250.0000 mL | INTRAVENOUS | Status: DC | PRN
Start: 2017-01-25 — End: 2017-01-25

## 2017-01-25 MED ORDER — CLOPIDOGREL BISULFATE 75 MG PO TABS
75.0000 mg | ORAL_TABLET | Freq: Every day | ORAL | Status: DC
  Administered 2017-01-26: 75 mg via ORAL
  Filled 2017-01-25: qty 1

## 2017-01-25 MED ORDER — IOPAMIDOL (ISOVUE-300) INJECTION 61%
INTRAVENOUS | Status: DC | PRN
  Administered 2017-01-25: 155 mL via INTRA_ARTERIAL

## 2017-01-25 MED ORDER — NITROGLYCERIN 0.4 MG SL SUBL: 0.4000 mg | SUBLINGUAL_TABLET | SUBLINGUAL | Status: DC | PRN

## 2017-01-25 MED ORDER — NITROGLYCERIN 5 MG/ML IV SOLN
INTRAVENOUS | Status: AC
  Filled 2017-01-25: qty 10

## 2017-01-25 MED ORDER — FENTANYL CITRATE (PF) 100 MCG/2ML IJ SOLN
INTRAMUSCULAR | Status: AC
  Filled 2017-01-25: qty 2

## 2017-01-25 MED ORDER — CLOPIDOGREL BISULFATE 75 MG PO TABS
ORAL_TABLET | ORAL | Status: DC | PRN
  Administered 2017-01-25: 300 mg via ORAL

## 2017-01-25 MED ORDER — SODIUM CHLORIDE 0.9% FLUSH
3.0000 mL | Freq: Two times a day (BID) | INTRAVENOUS | Status: DC
  Administered 2017-01-26 (×2): 3 mL via INTRAVENOUS

## 2017-01-25 MED ORDER — SODIUM CHLORIDE 0.9% FLUSH: 3.0000 mL | Freq: Two times a day (BID) | INTRAVENOUS | Status: DC

## 2017-01-25 MED ORDER — LABETALOL HCL 5 MG/ML IV SOLN: 10.0000 mg | INTRAVENOUS | Status: DC | PRN

## 2017-01-25 MED ORDER — ASPIRIN 81 MG PO CHEW
81.0000 mg | CHEWABLE_TABLET | Freq: Every day | ORAL | Status: DC
  Administered 2017-01-26: 81 mg via ORAL
  Filled 2017-01-25: qty 1

## 2017-01-25 MED ORDER — SODIUM CHLORIDE 0.9 % IV SOLN: INTRAVENOUS | Status: DC

## 2017-01-25 MED ORDER — SODIUM CHLORIDE 0.9 % IV SOLN
INTRAVENOUS | Status: AC | PRN
  Administered 2017-01-25: 1.75 mg/kg/h via INTRAVENOUS

## 2017-01-25 MED ORDER — HYDRALAZINE HCL 20 MG/ML IJ SOLN: 5.0000 mg | INTRAMUSCULAR | Status: DC | PRN

## 2017-01-25 MED ORDER — IOPAMIDOL (ISOVUE-300) INJECTION 61%
INTRAVENOUS | Status: DC | PRN
  Administered 2017-01-25: 140 mL via INTRA_ARTERIAL

## 2017-01-25 MED ORDER — LABETALOL HCL 5 MG/ML IV SOLN
10.0000 mg | INTRAVENOUS | Status: AC | PRN
  Administered 2017-01-25: 10 mg via INTRAVENOUS

## 2017-01-25 MED ORDER — BIVALIRUDIN TRIFLUOROACETATE 250 MG IV SOLR
INTRAVENOUS | Status: AC
  Filled 2017-01-25: qty 250

## 2017-01-25 MED ORDER — ACETAMINOPHEN 325 MG PO TABS
650.0000 mg | ORAL_TABLET | ORAL | Status: DC | PRN
  Administered 2017-01-25 – 2017-01-26 (×2): 650 mg via ORAL
  Filled 2017-01-25 (×2): qty 2

## 2017-01-25 MED ORDER — HYDRALAZINE HCL 20 MG/ML IJ SOLN: 5.0000 mg | INTRAMUSCULAR | Status: AC | PRN

## 2017-01-25 MED ORDER — LABETALOL HCL 5 MG/ML IV SOLN
INTRAVENOUS | Status: AC
  Filled 2017-01-25: qty 4

## 2017-01-25 MED ORDER — FENTANYL CITRATE (PF) 100 MCG/2ML IJ SOLN
INTRAMUSCULAR | Status: DC | PRN
Start: 2017-01-25 — End: 2017-01-25
  Administered 2017-01-25: 25 ug via INTRAVENOUS

## 2017-01-25 MED ORDER — LIDOCAINE HCL (PF) 1 % IJ SOLN
INTRAMUSCULAR | Status: DC | PRN
  Administered 2017-01-25: 15 mL

## 2017-01-25 MED ORDER — CLOPIDOGREL BISULFATE 75 MG PO TABS: 75.0000 mg | ORAL_TABLET | Freq: Every day | ORAL | Status: DC

## 2017-01-25 MED ORDER — ASPIRIN 81 MG PO CHEW: 81.0000 mg | CHEWABLE_TABLET | ORAL | Status: DC

## 2017-01-25 MED ORDER — CLOPIDOGREL BISULFATE 300 MG PO TABS
ORAL_TABLET | ORAL | Status: AC
  Filled 2017-01-25: qty 1

## 2017-01-25 MED ORDER — MIDAZOLAM HCL 2 MG/2ML IJ SOLN
INTRAMUSCULAR | Status: AC
  Filled 2017-01-25: qty 2

## 2017-01-25 MED ORDER — INSULIN ASPART 100 UNIT/ML ~~LOC~~ SOLN
0.0000 [IU] | Freq: Three times a day (TID) | SUBCUTANEOUS | Status: DC
  Administered 2017-01-25: 5 [IU] via SUBCUTANEOUS
  Administered 2017-01-26: 11 [IU] via SUBCUTANEOUS
  Administered 2017-01-26 (×2): 3 [IU] via SUBCUTANEOUS
  Filled 2017-01-25 (×4): qty 1

## 2017-01-25 MED ORDER — SODIUM CHLORIDE 0.9 % WEIGHT BASED INFUSION
3.0000 mL/kg/h | INTRAVENOUS | Status: DC
  Administered 2017-01-25: 3 mL/kg/h via INTRAVENOUS

## 2017-01-25 MED ORDER — PANTOPRAZOLE SODIUM 40 MG PO TBEC
40.0000 mg | DELAYED_RELEASE_TABLET | Freq: Every day | ORAL | Status: DC
  Administered 2017-01-25 – 2017-01-26 (×2): 40 mg via ORAL
  Filled 2017-01-25 (×2): qty 1

## 2017-01-25 MED ORDER — ALBUTEROL SULFATE (2.5 MG/3ML) 0.083% IN NEBU: 2.5000 mg | INHALATION_SOLUTION | RESPIRATORY_TRACT | Status: DC | PRN

## 2017-01-25 MED ORDER — BIVALIRUDIN BOLUS VIA INFUSION - CUPID
INTRAVENOUS | Status: DC | PRN
  Administered 2017-01-25: 84 mg via INTRAVENOUS

## 2017-01-25 MED ORDER — ASPIRIN 81 MG PO CHEW
CHEWABLE_TABLET | ORAL | Status: DC | PRN
  Administered 2017-01-25: 243 mg via ORAL

## 2017-01-25 MED ORDER — ASPIRIN 81 MG PO CHEW: 81.0000 mg | CHEWABLE_TABLET | Freq: Every day | ORAL | Status: DC

## 2017-01-25 SURGICAL SUPPLY — 25 items
BALLN TREK RX 3.0X20 (BALLOONS) ×4
BALLN ~~LOC~~ TREK RX 4.5X20 (BALLOONS) ×4
BALLOON TREK RX 3.0X20 (BALLOONS) ×2 IMPLANT
BALLOON ~~LOC~~ TREK RX 4.5X20 (BALLOONS) ×2 IMPLANT
CATH AMP RT 5F (CATHETERS) ×4 IMPLANT
CATH INFINITI 5 FR 3DRC (CATHETERS) ×4 IMPLANT
CATH INFINITI 5 FR MPA2 (CATHETERS) ×4 IMPLANT
CATH INFINITI 5FR ANG PIGTAIL (CATHETERS) ×4 IMPLANT
CATH INFINITI 5FR JL4 (CATHETERS) ×4 IMPLANT
CATH INFINITI JR4 5F (CATHETERS) ×4 IMPLANT
CATH LAUNCHER 6FR AR2 (CATHETERS) ×4 IMPLANT
CATH VISTA GUIDE 6FR AR1 (CATHETERS) ×4 IMPLANT
CATH VISTA GUIDE 6FR AR2 (CATHETERS) IMPLANT
CATH VISTA GUIDE 6FR MPA1 (CATHETERS) ×4 IMPLANT
DEVICE CLOSURE MYNXGRIP 6/7F (Vascular Products) ×4 IMPLANT
DEVICE INFLAT 30 PLUS (MISCELLANEOUS) ×4 IMPLANT
DEVICE SAFEGUARD 24CM (GAUZE/BANDAGES/DRESSINGS) ×4 IMPLANT
GUIDEWIRE 3MM J TIP .035 145 (WIRE) ×8 IMPLANT
KIT MANI 3VAL PERCEP (MISCELLANEOUS) ×4 IMPLANT
NEEDLE PERC 18GX7CM (NEEDLE) ×4 IMPLANT
PACK CARDIAC CATH (CUSTOM PROCEDURE TRAY) ×4 IMPLANT
SHEATH AVANTI 6FR X 11CM (SHEATH) ×4 IMPLANT
SHEATH PINNACLE 5F 10CM (SHEATH) ×4 IMPLANT
STENT SIERRA 3.50 X 28 MM (Permanent Stent) ×4 IMPLANT
WIRE G HI TQ BMW 190 (WIRE) ×4 IMPLANT
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
## 2017-01-25 NOTE — Progress Notes (Signed)
Pt complaining of headache, PRN tylenol given. Lights cut off, made comfortable, will continue to monitor.

## 2017-01-25 NOTE — Progress Notes (Signed)
Pt headache now at a 6 on 1-10 pain scale. Notified Dr. Jannifer Franklin via Shea Evans of pt. Status. Pt. Made comfortable and given cool compress.

## 2017-01-25 NOTE — Progress Notes (Signed)
Pt complaining of double vision, made pt comfortable and paged MD. Notified MD, verbal orders given.Orders implemented, will continue to monitor pt.

## 2017-01-25 NOTE — Progress Notes (Signed)
Pt accepted in transfer from [facility]. He is a/o. C/o slight headache. Rt groind pad may be deflated at 1600 at which time he may have hob elevated. Rt groin clean. Strong pedal pulses. vss

## 2017-01-25 NOTE — Progress Notes (Signed)
Pt back from CT

## 2017-01-25 NOTE — Progress Notes (Signed)
Pt c/o severe head ache 8/10 on pain scale 1-10. Pain not relieved by previous dose of tylenol given @ 17:59.  notified dr. Jannifer Franklin. He will place new order for norco PRN q 4 hrs for moderate pain.

## 2017-01-26 ENCOUNTER — Observation Stay: Payer: Managed Care, Other (non HMO)

## 2017-01-26 DIAGNOSIS — H532 Diplopia: Secondary | ICD-10-CM

## 2017-01-26 DIAGNOSIS — I471 Supraventricular tachycardia: Secondary | ICD-10-CM | POA: Diagnosis not present

## 2017-01-26 LAB — HEMOGLOBIN A1C
Hgb A1c MFr Bld: 12.4 % — ABNORMAL HIGH (ref 4.8–5.6)
MEAN PLASMA GLUCOSE: 309.18 mg/dL

## 2017-01-26 LAB — GLUCOSE, CAPILLARY
GLUCOSE-CAPILLARY: 315 mg/dL — AB (ref 65–99)
GLUCOSE-CAPILLARY: 196 mg/dL — AB (ref 65–99)
Glucose-Capillary: 199 mg/dL — ABNORMAL HIGH (ref 65–99)

## 2017-01-26 MED ORDER — INSULIN GLARGINE 100 UNIT/ML ~~LOC~~ SOLN
50.0000 [IU] | Freq: Every day | SUBCUTANEOUS | Status: DC
  Filled 2017-01-26: qty 0.5

## 2017-01-26 NOTE — Progress Notes (Signed)
Spoke with dr. Darvin Neighbours to make aware that carotid US results are back. Patient continues to have a headache and double vision. Per md he spoke with family on the phone and updated them on the results of the mri and [place], per md he will discharge patient home tonight

## 2017-01-26 NOTE — Discharge Instructions (Signed)
Diabetic diet

## 2017-01-26 NOTE — Progress Notes (Signed)
Patient c/o blurry/double vision no other neurological issues or deficits noted. CT performed on 01/26/2016 with no remarkable results. Notified Dr. Marcille Blanco of pt complaints he will continue to monitor.

## 2017-01-26 NOTE — Progress Notes (Addendum)
Spoke with dr. Darvin Neighbours, md already aware of mri results per md will wait for carotid US result and then will  talk with family about the results

## 2017-01-26 NOTE — Progress Notes (Signed)
Patient discharged home accompanied with his wife and Merry Proud (NT) to his car. No acute distress noted or any complain of pain.  Discharge instructions went over with the patient and he verbalized understanding. 2 IV  removed and telemetry monitoring removed as well.

## 2017-01-26 NOTE — Consult Note (Addendum)
Referring Physician: Sudini    Chief Complaint: Diplopia  HPI: James Maxwell is an 66 y.o. male with a history of CAD s/p PCI and stent of in-stent restenosis to the mid RCA on 9/7.  Patient admitted after procedure.  In the evening had acute onset of diplopia.  Reports vertical diplopia.  Not reproducibly present in any position or with near or far testing.  Consult called for further recommendations.  Initial NIHSS of 0.   Patient also complains of headache.  Reports that he has headaches on an outpatient basis as well.  They are left occipital and have continued with the same character while hospitalized.  Diplopia is not dependent on headache.    Date last known well: Date: 01/25/2017 Time last known well: Time: 18:30 tPA Given: No: Low NIHSS   Past Medical History:  Diagnosis Date  . Arthritis   . Chronic diastolic CHF (congestive heart failure) ([place])    a. 04/2014 Echo: EF 55%, Gr1 DD.  [name] Coronary artery disease    a. 2006 s/p PCI RCA;  b. 2012 MV: no ischemia.  . Diabetes mellitus without complication ([place])   . GERD (gastroesophageal reflux disease)   . History of gout   . Hyperlipidemia   . Hypertension   . MI, old 68  . SVT (supraventricular tachycardia) ([facility])     Past Surgical History:  Procedure Laterality Date  . ABDOMINAL SURGERY     [facility] 1970'S  . CORONARY STENT PLACEMENT  2004  . JOINT REPLACEMENT     RT TOTAL KNEE  . REVISION TOTAL KNEE ARTHROPLASTY     RT KNEE  . TONSILLECTOMY    . TOTAL KNEE ARTHROPLASTY Left 07/20/2013   Procedure: LEFT TOTAL KNEE ARTHROPLASTY;  Surgeon: Gearlean Alf, MD;  Location: [facility];  Service: Orthopedics;  Laterality: Left;    Family History  Problem Relation Age of Onset  . Diabetes Mother   . Hypertension Mother   . Heart attack Mother   . Hypertension Sister   . Cancer Brother   . Heart attack Son   . Cancer Brother   . Heart attack Brother   . Hypertension Sister    Social History:  reports that he quit smoking  about 21 years ago. He has never used smokeless tobacco. He reports that he does not drink alcohol or use drugs.  Allergies:  Allergies  Allergen Reactions  . Lipitor [Atorvastatin]     myalgias    Medications:  I have reviewed the patient's current medications. Prior to Admission:  Prescriptions Prior to Admission  Medication Sig Dispense Refill Last Dose  . albuterol (PROVENTIL HFA;VENTOLIN HFA) 108 (90 Base) MCG/ACT inhaler Inhale 1-2 puffs into the lungs every 4 (four) hours as needed for wheezing or shortness of breath. 1 Inhaler 0 prn at prn  . aspirin EC 81 MG tablet Take 81 mg by mouth daily.   01/25/2017 at Unknown time  . Cholecalciferol (VITAMIN D) 2000 units tablet Take 2,000 Units by mouth daily.    01/24/2017 at Unknown time  . clopidogrel (PLAVIX) 75 MG tablet Take 75 mg by mouth daily.   01/25/2017 at Unknown time  . insulin aspart (FIASP) 100 UNIT/ML injection Inject 4-20 Units into the skin 3 (three) times daily before meals. 131-180=4 units, 181-240=8 units, 241-300=10 units, 301-350=12 units, 351-400=16 units, and if blood sugar is greater than 400=20 units and call MD   01/24/2017 at Unknown time  . Insulin Glargine (LANTUS SOLOSTAR) 100 UNIT/ML Solostar  Pen Inject 48 Units into the skin at bedtime.   01/24/2017 at Unknown time  . losartan (COZAAR) 100 MG tablet Take 100 mg by mouth daily.   01/25/2017 at Unknown time  . metFORMIN (GLUCOPHAGE) 500 MG tablet Take 500 mg by mouth 2 (two) times daily with a meal.   01/24/2017 at Unknown time  . metoprolol succinate (TOPROL-XL) 50 MG 24 hr tablet Take 50 mg by mouth daily. Take with or immediately following a meal.   01/24/2017 at Unknown time  . nitroGLYCERIN (NITROSTAT) 0.4 MG SL tablet Place 1 tablet (0.4 mg total) under the tongue every 5 (five) minutes as needed for chest pain. 25 tablet 5 01/24/2017 at Unknown time  . pantoprazole (PROTONIX) 40 MG tablet Take 40 mg by mouth daily.   01/24/2017 at Unknown time  . amLODipine (NORVASC) 10 MG  tablet Take 1 tablet (10 mg total) by mouth daily. (Patient not taking: Reported on 01/23/2017) 30 tablet 0 Not Taking at Unknown time  . diltiazem (CARDIZEM CD) 120 MG 24 hr capsule Take 1 capsule (120 mg total) by mouth daily. (Patient not taking: Reported on 01/23/2017) 30 capsule 11 Not Taking at Unknown time  . ezetimibe (ZETIA) 10 MG tablet Take 1 tablet (10 mg total) by mouth daily. (Patient not taking: Reported on 01/23/2017) 30 tablet 0 Not Taking at Unknown time  . furosemide (LASIX) 40 MG tablet Take 1 tablet (40 mg total) by mouth daily. (Patient not taking: Reported on 01/23/2017) 30 tablet 0 Not Taking at Unknown time  . insulin lispro (HUMALOG) 100 UNIT/ML injection Insulin pen, use as directed by your doctor's sliding scale  Dispense 2 pens refil x 3  Disregard below dispensing instructions generated by  He computer (Patient not taking: Reported on 01/23/2017) 10 mL 3 Not Taking at Unknown time  . metoprolol tartrate (LOPRESSOR) 25 MG tablet Take 1 tablet (25 mg total) by mouth 2 (two) times daily. (Patient not taking: Reported on 01/04/2017) 60 tablet 0 Not Taking at Unknown time  . potassium chloride SA (K-DUR,KLOR-CON) 20 MEQ tablet Take 1 tablet (20 mEq total) by mouth daily. (Patient not taking: Reported on 01/23/2017) 30 tablet 0 Not Taking at Unknown time   Scheduled: . aspirin  81 mg Oral Daily  . clopidogrel  75 mg Oral Q breakfast  . insulin aspart  0-15 Units Subcutaneous TID WC  . insulin aspart  0-5 Units Subcutaneous QHS  . insulin glargine  50 Units Subcutaneous QHS  . losartan  100 mg Oral Daily  . metoprolol succinate  50 mg Oral Daily  . pantoprazole  40 mg Oral Daily  . sodium chloride flush  3 mL Intravenous Q12H    ROS: History obtained from the patient  General ROS: negative for - chills, fatigue, fever, night sweats, weight gain or weight loss Psychological ROS: negative for - behavioral disorder, hallucinations, memory difficulties, mood swings or suicidal  ideation Ophthalmic ROS: negative for - blurry vision, double vision, eye pain or loss of vision ENT ROS: negative for - epistaxis, nasal discharge, oral lesions, sore throat, tinnitus or vertigo Allergy and Immunology ROS: negative for - hives or itchy/watery eyes Hematological and Lymphatic ROS: negative for - bleeding problems, bruising or swollen lymph nodes Endocrine ROS: negative for - galactorrhea, hair pattern changes, polydipsia/polyuria or temperature intolerance Respiratory ROS: negative for - cough, hemoptysis, shortness of breath or wheezing Cardiovascular ROS: LE edema Gastrointestinal ROS: negative for - abdominal pain, diarrhea, hematemesis, nausea/vomiting or stool incontinence Genito-Urinary  ROS: negative for - dysuria, hematuria, incontinence or urinary frequency/urgency Musculoskeletal ROS: negative for - joint swelling or muscular weakness Neurological ROS: as noted in HPI Dermatological ROS: negative for rash and skin lesion changes  Physical Examination: Blood pressure (!) 148/73, pulse 72, temperature 97.9 F (36.6 C), temperature source Oral, resp. rate 19, height 5\' 9"  (1.753 m), weight 112 kg (247 lb), SpO2 97 %.  HEENT-  Normocephalic, no lesions, without obvious abnormality.  Normal external eye and conjunctiva.  Normal TM's bilaterally.  Normal auditory canals and external ears. Normal external nose, mucus membranes and septum.  Normal pharynx. Cardiovascular- S1, S2 normal, pulses palpable throughout   Lungs- chest clear, no wheezing, rales, normal symmetric air entry Abdomen- soft, non-tender; bowel sounds normal; no masses,  no organomegaly Extremities- BLE edema Lymph-no adenopathy palpable Musculoskeletal-no joint tenderness, deformity or swelling Skin-warm and dry, no hyperpigmentation, vitiligo, or suspicious lesions  Neurological Examination   Mental Status: Alert, oriented, thought content appropriate.  Speech fluent without evidence of aphasia.   Able to follow 3 step commands without difficulty. Cranial Nerves: II: Discs flat bilaterally; Visual fields grossly normal, pupils equal, round, reactive to light and accommodation III,IV, VI: ptosis not present, extra-ocular motions intact bilaterally.  Patient with no diplopia with near testing.  Reports that when looking to the left sees diplopia when looking at a chart only.  When looking outside the window no diplopia.  When looking at his wife across the room has some blurry vision.   V,VII: smile symmetric, facial light touch sensation normal bilaterally VIII: hearing normal bilaterally IX,X: gag reflex present XI: bilateral shoulder shrug XII: midline tongue extension Motor: Right : Upper extremity   5/5    Left:     Upper extremity   5/5  Lower extremity   5/5     Lower extremity   5/5 Tone and bulk:normal tone throughout; no atrophy noted Sensory: Pinprick and light touch intact throughout, bilaterally Deep Tendon Reflexes: 2+ in the upper extremities and absent in the lower extremities Plantars: Right: mute   Left: mute Cerebellar: Normal finger-to-nose and normal heel-to-shin testing bilaterally Gait: not tested due to safety concerns   Laboratory Studies:  Basic Metabolic Panel: No results for input(s): NA, K, CL, CO2, GLUCOSE, BUN, CREATININE, CALCIUM, MG, PHOS in the last 168 hours.  Liver Function Tests: No results for input(s): AST, ALT, ALKPHOS, BILITOT, PROT, ALBUMIN in the last 168 hours. No results for input(s): LIPASE, AMYLASE in the last 168 hours. No results for input(s): AMMONIA in the last 168 hours.  CBC: No results for input(s): WBC, NEUTROABS, HGB, HCT, MCV, PLT in the last 168 hours.  Cardiac Enzymes: No results for input(s): CKTOTAL, CKMB, CKMBINDEX, TROPONINI in the last 168 hours.  BNP: Invalid input(s): POCBNP  CBG:  Recent Labs Lab 01/25/17 1048 01/25/17 1501 01/25/17 1652 01/25/17 2137 01/26/17 0745  GLUCAP 138* 100* 243* 301* 315*     Microbiology: Results for orders placed or performed during the hospital encounter of 07/15/13  Surgical pcr screen     Status: None   Collection Time: 07/15/13  9:47 AM  Result Value Ref Range Status   MRSA, PCR NEGATIVE NEGATIVE Final   Staphylococcus aureus NEGATIVE NEGATIVE Final    Comment:        The Xpert SA Assay (FDA approved for NASAL specimens in patients over 65 years of age), is one component of a comprehensive surveillance program.  Test performance has been validated by [employer] for patients  greater than or equal to 40 year old. It is not intended to diagnose infection nor to guide or monitor treatment.    Coagulation Studies: No results for input(s): LABPROT, INR in the last 72 hours.  Urinalysis: No results for input(s): COLORURINE, LABSPEC, PHURINE, GLUCOSEU, HGBUR, BILIRUBINUR, KETONESUR, PROTEINUR, UROBILINOGEN, NITRITE, LEUKOCYTESUR in the last 168 hours.  Invalid input(s): APPERANCEUR  Lipid Panel:    Component Value Date/Time   CHOL 193 04/13/2016 0424   TRIG 106 04/13/2016 0424   HDL 48 04/13/2016 0424   CHOLHDL 4.0 04/13/2016 0424   VLDL 21 04/13/2016 0424   LDLCALC 124 (H) 04/13/2016 0424    HgbA1C:  Lab Results  Component Value Date   HGBA1C 12.4 (H) 01/25/2017    Urine Drug Screen:  No results found for: LABOPIA, COCAINSCRNUR, LABBENZ, AMPHETMU, THCU, LABBARB  Alcohol Level: No results for input(s): ETH in the last 168 hours.  Other results: EKG: normal sinus rhythm at 73 bpm.  Imaging: Ct Head Wo Contrast  Result Date: 01/25/2017 CLINICAL DATA:  Initial evaluation for acute headache, double vision. EXAM: CT HEAD WITHOUT CONTRAST TECHNIQUE: Contiguous axial images were obtained from the base of the skull through the vertex without intravenous contrast. COMPARISON:  None available. FINDINGS: Brain: Generalized age-related cerebral atrophy with viral chronic small vessel ischemic disease. No acute intracranial hemorrhage. No  acute large vessel territory infarct. No mass lesion, midline shift or mass effect. No hydrocephalus. No extra-axial fluid collection. Vascular: No asymmetric hyperdense vessel. Skull: Scalp soft tissues and calvarium within normal limits. Sinuses/Orbits: Globes and orbital soft tissues within normal limits. Patient status post lens extraction bilaterally. Paranasal sinuses and mastoid air cells are clear. Other: None. IMPRESSION: 1. No acute intracranial process. 2. Mild chronic microvascular disease for age. Electronically Signed   By: Jeannine Boga M.D.   On: 01/25/2017 19:19    Assessment: 66 y.o. male reporting diplopia after PCI with stent placement.  Patient without other neurological complaints.  Head CT reviewed and shows no acute changes.  Patient unable to have an MRI.  On ASA and Plavix.  No tele evidence of atrial fibrillation.  Neurological examination somewhat unusual and symptoms may not represent ischemic disease.  A1c 12.4.  Unable to perform MRI and timing concerning therefore would work up further.  EF normal per cath report.      Stroke Risk Factors - diabetes mellitus, hyperlipidemia and hypertension  Plan: 1. Fasting lipid panel 2. Carotid dopplers 3. Prophylactic therapy-Agree with continued ASA and Plavix 4. Telemetry monitoring 5. Frequent neuro checks 6. Patient to follow up with ophthalmology on an outpatient basis.  To follow up with neurology on an outpatient basis where a determination can be made for the need of an MRI at a later date.      Alexis Goodell, MD Neurology [phone] 01/26/2017, 10:28 AM

## 2017-01-28 ENCOUNTER — Encounter: Payer: Self-pay | Admitting: Cardiovascular Disease

## 2017-01-29 NOTE — Discharge Summary (Signed)
[facility] NAME: James Maxwell    MR#:  [id number]  DATE OF BIRTH:  03/19/1951  DATE OF ADMISSION:  01/25/2017 ADMITTING PHYSICIAN: Yolonda Kida, MD  DATE OF DISCHARGE: 01/26/2017  8:48 PM  PRIMARY CARE PHYSICIAN: Glendale Chard, MD   ADMISSION DIAGNOSIS:  Unstable angina ([place]) [I20.0]  DISCHARGE DIAGNOSIS:  Active Problems:   Unstable angina ([place])   SECONDARY DIAGNOSIS:   Past Medical History:  Diagnosis Date  . Arthritis   . Chronic diastolic CHF (congestive heart failure) ([place])    a. 04/2014 Echo: EF 55%, Gr1 DD.  [name] Coronary artery disease    a. 2006 s/p PCI RCA;  b. 2012 MV: no ischemia.  . Diabetes mellitus without complication ([place])   . GERD (gastroesophageal reflux disease)   . History of gout   . Hyperlipidemia   . Hypertension   . MI, old 29  . SVT (supraventricular tachycardia) ([place])      ADMITTING HISTORY  HISTORY OF PRESENT ILLNESS:  James Maxwell  is a 66 y.o. male with a known history of diabetes, hypertension, hyperlipidemia, CAD presented to the hospital for cardiac catheterization. Patient initially had abnormal stress test and later followed by a CTA coronaries. This showed in-stent stenosis of the RCA. Today patient had intervention with PCI to the mid RCA and will be admitted to the hospital for post PCI monitoring.  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 Post Complication. Patient is on aspirin, Plavix and statin. Seen by neurology Dr. Doy Mince. She suggested getting a carotid ultrasound. This was done and showed no acute stenosis. Patient's symptoms are slowly improving and he is on optimal treatment and will be discharged home to follow-up with neurology as outpatient. He did not have any focal motor or speech deficits needing PT or speech therapy.  * CAD with in-stent stenosis. Patient had PCI with intervention by Dr. Clayborn Bigness.  Discussed with Dr. Humphrey Rolls his cardiologist. On aspirin, Plavix, statin, beta blocker, ACE inhibitor.  Other comorbidities remained stable. An discharge home in stable condition.  CONSULTS OBTAINED:    DRUG ALLERGIES:   Allergies  Allergen Reactions  . Lipitor [Atorvastatin]     myalgias    DISCHARGE MEDICATIONS:   Discharge Medication List as of 01/26/2017  7:51 PM    CONTINUE these medications which have NOT CHANGED   Details  albuterol (PROVENTIL HFA;VENTOLIN HFA) 108 (90 Base) MCG/ACT inhaler Inhale 1-2 puffs into the lungs every 4 (four) hours as needed for wheezing or shortness of breath., Starting Thu 01/12/2016, Print    aspirin EC 81 MG tablet Take 81 mg by mouth daily., Historical Med    Cholecalciferol (VITAMIN D) 2000 units tablet Take 2,000 Units by mouth daily. , Historical Med    clopidogrel (PLAVIX) 75 MG tablet Take 75 mg by mouth daily., Historical Med    insulin aspart (FIASP) 100 UNIT/ML injection Inject 4-20 Units into the skin 3 (three) times daily before meals. 131-180=4 units, 181-240=8 units, 241-300=10 units, 301-350=12 units, 351-400=16 units, and if blood sugar is greater than 400=20 units and call MD, Historical Med    Insulin Glargine (LANTUS SOLOSTAR) 100 UNIT/ML Solostar Pen Inject 48 Units into the skin at bedtime., Historical Med    losartan (COZAAR) 100 MG tablet Take 100 mg by mouth daily., Historical Med    metFORMIN (GLUCOPHAGE) 500 MG tablet Take 500 mg by mouth 2 (two) times daily with a  meal., Historical Med    metoprolol succinate (TOPROL-XL) 50 MG 24 hr tablet Take 50 mg by mouth daily. Take with or immediately following a meal., Historical Med    nitroGLYCERIN (NITROSTAT) 0.4 MG SL tablet Place 1 tablet (0.4 mg total) under the tongue every 5 (five) minutes as needed for chest pain., Starting 05/01/2013, Until Discontinued, Normal    pantoprazole (PROTONIX) 40 MG tablet Take 40 mg by mouth daily., Until Discontinued, Historical Med     amLODipine (NORVASC) 10 MG tablet Take 1 tablet (10 mg total) by mouth daily., Starting Sat 04/14/2016, Print    diltiazem (CARDIZEM CD) 120 MG 24 hr capsule Take 1 capsule (120 mg total) by mouth daily., Starting Fri 01/04/2017, Until Sat 01/04/2018, Print    ezetimibe (ZETIA) 10 MG tablet Take 1 tablet (10 mg total) by mouth daily., Starting Fri 04/13/2016, Print    furosemide (LASIX) 40 MG tablet Take 1 tablet (40 mg total) by mouth daily., Starting Fri 05/18/2016, Normal    insulin lispro (HUMALOG) 100 UNIT/ML injection Insulin pen, use as directed by your doctor's sliding scale  Dispense 2 pens refil x 3  Disregard below dispensing instructions generated by  He computer, Print    metoprolol tartrate (LOPRESSOR) 25 MG tablet Take 1 tablet (25 mg total) by mouth 2 (two) times daily., Starting Fri 05/18/2016, Normal    potassium chloride SA (K-DUR,KLOR-CON) 20 MEQ tablet Take 1 tablet (20 mEq total) by mouth daily., Starting Fri 05/18/2016, Normal        Today   VITAL SIGNS:  Blood pressure (!) 154/81, pulse 72, temperature 98.4 F (36.9 C), temperature source Oral, resp. rate 19, height 5\' 9"  (1.753 m), weight 112 kg (247 lb), SpO2 98 %.  I/O:  No intake or output data in the 24 hours ending 01/29/17 1647  PHYSICAL EXAMINATION:  Physical Exam  GENERAL:  66 y.o.-year-old patient lying in the bed with no acute distress.  LUNGS: Normal breath sounds bilaterally, no wheezing, rales,rhonchi or crepitation. No use of accessory muscles of respiration.  CARDIOVASCULAR: S1, S2 normal. No murmurs, rubs, or gallops.  ABDOMEN: Soft, non-tender, non-distended. Bowel sounds present. No organomegaly or mass.  NEUROLOGIC: Moves all 4 extremities. PSYCHIATRIC: The patient is alert and oriented x 3.  SKIN: No obvious rash, lesion, or ulcer.   DATA REVIEW:   CBC No results for input(s): WBC, HGB, HCT, PLT in the last 168 hours.  Chemistries  No results for input(s): NA, K, CL, CO2,  GLUCOSE, BUN, CREATININE, CALCIUM, MG, AST, ALT, ALKPHOS, BILITOT in the last 168 hours.  Invalid input(s): GFRCGP  Cardiac Enzymes No results for input(s): TROPONINI in the last 168 hours.  Microbiology Results  Results for orders placed or performed during the hospital encounter of 07/15/13  Surgical pcr screen     Status: None   Collection Time: 07/15/13  9:47 AM  Result Value Ref Range Status   MRSA, PCR NEGATIVE NEGATIVE Final   Staphylococcus aureus NEGATIVE NEGATIVE Final    Comment:        The Xpert SA Assay (FDA approved for NASAL specimens in patients over 56 years of age), is one component of a comprehensive surveillance program.  Test performance has been validated by [employer] for patients greater than or equal to 34 year old. It is not intended to diagnose infection nor to guide or monitor treatment.    RADIOLOGY:  No results found.  Follow up with PCP in 1 week.  Management plans discussed  with the patient, family and they are in agreement.  CODE STATUS:  Code Status History    Date Active Date Inactive Code Status Order ID Comments User Context   01/25/2017  2:23 PM 01/27/2017 12:24 AM Full Code [id number]  Yolonda Kida, MD Inpatient   01/25/2017  2:22 PM 01/25/2017  2:23 PM Full Code [id number]  Yolonda Kida, MD Inpatient   05/17/2016  4:22 AM 05/18/2016  3:44 PM Full Code [id number]  Harrie Foreman, MD Inpatient   04/13/2016 12:46 AM 04/13/2016  7:35 PM Full Code [id number]  Harvie Bridge, DO Inpatient   07/20/2013  7:17 PM 07/22/2013  7:14 PM Full Code [id number]  Gearlean Alf, MD Inpatient      TOTAL TIME TAKING CARE OF THIS PATIENT ON DAY OF DISCHARGE: more than 30 minutes.   Hillary Bow R M.D on 01/29/2017 at 4:47 PM  Between 7am to 6pm - Pager - [phone]  After 6pm go to [email or web address] - password [email or web address] [facility]  Office  [phone]  CC: Primary care physician; Glendale Chard, MD  Note: This  dictation was prepared with Dragon dictation along with smaller phrase technology. Any transcriptional errors that result from this process are unintentional.

## 2017-01-31 ENCOUNTER — Emergency Department: Payer: Managed Care, Other (non HMO)

## 2017-01-31 ENCOUNTER — Emergency Department
Admission: EM | Admit: 2017-01-31 | Discharge: 2017-01-31 | Disposition: A | Discharge status: Home / Self Care | Payer: Managed Care, Other (non HMO) | Attending: Emergency Medicine | Admitting: Emergency Medicine

## 2017-01-31 DIAGNOSIS — I252 Old myocardial infarction: Secondary | ICD-10-CM | POA: Diagnosis not present

## 2017-01-31 DIAGNOSIS — R51 Headache: Secondary | ICD-10-CM | POA: Diagnosis not present

## 2017-01-31 DIAGNOSIS — I2510 Atherosclerotic heart disease of native coronary artery without angina pectoris: Secondary | ICD-10-CM | POA: Diagnosis not present

## 2017-01-31 DIAGNOSIS — Z96652 Presence of left artificial knee joint: Secondary | ICD-10-CM | POA: Insufficient documentation

## 2017-01-31 DIAGNOSIS — Z87891 Personal history of nicotine dependence: Secondary | ICD-10-CM | POA: Insufficient documentation

## 2017-01-31 DIAGNOSIS — I5032 Chronic diastolic (congestive) heart failure: Secondary | ICD-10-CM | POA: Diagnosis not present

## 2017-01-31 DIAGNOSIS — Z7902 Long term (current) use of antithrombotics/antiplatelets: Secondary | ICD-10-CM | POA: Diagnosis not present

## 2017-01-31 DIAGNOSIS — G43909 Migraine, unspecified, not intractable, without status migrainosus: Secondary | ICD-10-CM | POA: Diagnosis present

## 2017-01-31 DIAGNOSIS — Z794 Long term (current) use of insulin: Secondary | ICD-10-CM | POA: Insufficient documentation

## 2017-01-31 DIAGNOSIS — Z7982 Long term (current) use of aspirin: Secondary | ICD-10-CM | POA: Diagnosis not present

## 2017-01-31 DIAGNOSIS — I110 Hypertensive heart disease with heart failure: Secondary | ICD-10-CM | POA: Insufficient documentation

## 2017-01-31 DIAGNOSIS — Z96651 Presence of right artificial knee joint: Secondary | ICD-10-CM | POA: Insufficient documentation

## 2017-01-31 DIAGNOSIS — H53459 Other localized visual field defect, unspecified eye: Secondary | ICD-10-CM | POA: Insufficient documentation

## 2017-01-31 DIAGNOSIS — Z79899 Other long term (current) drug therapy: Secondary | ICD-10-CM | POA: Diagnosis not present

## 2017-01-31 DIAGNOSIS — R519 Headache, unspecified: Secondary | ICD-10-CM

## 2017-01-31 DIAGNOSIS — E119 Type 2 diabetes mellitus without complications: Secondary | ICD-10-CM | POA: Insufficient documentation

## 2017-01-31 HISTORY — DX: Cerebral infarction, unspecified: I63.9

## 2017-01-31 MED ORDER — BUTALBITAL-APAP-CAFFEINE 50-325-40 MG PO TABS: 1.0000 | ORAL_TABLET | Freq: Four times a day (QID) | ORAL | 0 refills | Status: DC | PRN

## 2017-01-31 MED ORDER — KETOROLAC TROMETHAMINE 30 MG/ML IJ SOLN
30.0000 mg | Freq: Once | INTRAMUSCULAR | Status: AC
  Administered 2017-01-31: 30 mg via INTRAVENOUS
  Filled 2017-01-31: qty 1

## 2017-01-31 MED ORDER — SODIUM CHLORIDE 0.9 % IV BOLUS (SEPSIS)
1000.0000 mL | Freq: Once | INTRAVENOUS | Status: AC
  Administered 2017-01-31: 1000 mL via INTRAVENOUS

## 2017-01-31 MED ORDER — DIPHENHYDRAMINE HCL 50 MG/ML IJ SOLN
50.0000 mg | Freq: Once | INTRAMUSCULAR | Status: AC
  Administered 2017-01-31: 50 mg via INTRAVENOUS
  Filled 2017-01-31: qty 1

## 2017-01-31 MED ORDER — METOCLOPRAMIDE HCL 5 MG/ML IJ SOLN
10.0000 mg | Freq: Once | INTRAMUSCULAR | Status: AC
  Administered 2017-01-31: 10 mg via INTRAVENOUS
  Filled 2017-01-31: qty 2

## 2017-01-31 NOTE — Discharge Instructions (Signed)
Please follow-up with neurology at 2 PM today as scheduled. Please take your Fioricet as needed, as written. Return to the emergency department for any significant headache, any weakness, numbness, slurred speech, confusion, or any other symptom personally concerning to yourself.

## 2017-01-31 NOTE — ED Triage Notes (Signed)
Pt arrives to ER via POV c/o headache towards left side of head and double vision since Saturday. Pt hx of recent stroke. Pt alert and oriented X4, active, cooperative, pt in NAD. RR even and unlabored, color WNL.
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
## 2017-02-07 ENCOUNTER — Other Ambulatory Visit: Payer: Self-pay | Admitting: Internal Medicine

## 2017-02-07 DIAGNOSIS — K563 Gallstone ileus: Secondary | ICD-10-CM

## 2017-02-08 ENCOUNTER — Ambulatory Visit
Admission: RE | Admit: 2017-02-08 | Discharge: 2017-02-08 | Disposition: A | Discharge status: Home / Self Care | Payer: Managed Care, Other (non HMO) | Source: Ambulatory Visit | Attending: Internal Medicine | Admitting: Internal Medicine

## 2017-02-08 DIAGNOSIS — K563 Gallstone ileus: Secondary | ICD-10-CM | POA: Diagnosis present

## 2017-02-08 DIAGNOSIS — K8020 Calculus of gallbladder without cholecystitis without obstruction: Secondary | ICD-10-CM | POA: Diagnosis not present

## 2017-02-27 ENCOUNTER — Other Ambulatory Visit: Payer: Self-pay | Admitting: Surgery

## 2017-02-27 ENCOUNTER — Encounter: Payer: Self-pay | Admitting: Gastroenterology

## 2017-02-27 DIAGNOSIS — K8020 Calculus of gallbladder without cholecystitis without obstruction: Secondary | ICD-10-CM

## 2017-03-12 ENCOUNTER — Ambulatory Visit
Admission: RE | Admit: 2017-03-12 | Discharge: 2017-03-12 | Disposition: A | Discharge status: Home / Self Care | Payer: Medicare Other | Source: Ambulatory Visit | Attending: Surgery | Admitting: Surgery

## 2017-03-12 DIAGNOSIS — K8020 Calculus of gallbladder without cholecystitis without obstruction: Secondary | ICD-10-CM

## 2017-03-12 MED ORDER — GADOBENATE DIMEGLUMINE 529 MG/ML IV SOLN
20.0000 mL | Freq: Once | INTRAVENOUS | Status: AC | PRN
  Administered 2017-03-12: 20 mL via INTRAVENOUS

## 2017-03-19 ENCOUNTER — Encounter: Payer: Self-pay | Admitting: Emergency Medicine

## 2017-03-19 ENCOUNTER — Emergency Department
Admission: EM | Admit: 2017-03-19 | Discharge: 2017-03-19 | Disposition: A | Discharge status: Home / Self Care | Payer: Managed Care, Other (non HMO) | Attending: Emergency Medicine | Admitting: Emergency Medicine

## 2017-03-19 ENCOUNTER — Emergency Department: Payer: Managed Care, Other (non HMO)

## 2017-03-19 DIAGNOSIS — I110 Hypertensive heart disease with heart failure: Secondary | ICD-10-CM | POA: Insufficient documentation

## 2017-03-19 DIAGNOSIS — Z794 Long term (current) use of insulin: Secondary | ICD-10-CM | POA: Insufficient documentation

## 2017-03-19 DIAGNOSIS — I2510 Atherosclerotic heart disease of native coronary artery without angina pectoris: Secondary | ICD-10-CM | POA: Insufficient documentation

## 2017-03-19 DIAGNOSIS — Z8673 Personal history of transient ischemic attack (TIA), and cerebral infarction without residual deficits: Secondary | ICD-10-CM | POA: Insufficient documentation

## 2017-03-19 DIAGNOSIS — E119 Type 2 diabetes mellitus without complications: Secondary | ICD-10-CM | POA: Insufficient documentation

## 2017-03-19 DIAGNOSIS — R0602 Shortness of breath: Secondary | ICD-10-CM | POA: Diagnosis present

## 2017-03-19 DIAGNOSIS — Z7982 Long term (current) use of aspirin: Secondary | ICD-10-CM | POA: Diagnosis not present

## 2017-03-19 DIAGNOSIS — I471 Supraventricular tachycardia: Secondary | ICD-10-CM

## 2017-03-19 DIAGNOSIS — Z87891 Personal history of nicotine dependence: Secondary | ICD-10-CM | POA: Diagnosis not present

## 2017-03-19 DIAGNOSIS — R079 Chest pain, unspecified: Secondary | ICD-10-CM

## 2017-03-19 DIAGNOSIS — I509 Heart failure, unspecified: Secondary | ICD-10-CM | POA: Diagnosis not present

## 2017-03-19 DIAGNOSIS — Z955 Presence of coronary angioplasty implant and graft: Secondary | ICD-10-CM | POA: Insufficient documentation

## 2017-03-19 DIAGNOSIS — Z79899 Other long term (current) drug therapy: Secondary | ICD-10-CM | POA: Insufficient documentation

## 2017-03-19 LAB — CBC
HEMATOCRIT: 45.2 % (ref 40.0–52.0)
HEMOGLOBIN: 15.2 g/dL (ref 13.0–18.0)
MCH: 28.4 pg (ref 26.0–34.0)
MCHC: 33.6 g/dL (ref 32.0–36.0)
MCV: 84.6 fL (ref 80.0–100.0)
Platelets: 273 10*3/uL (ref 150–440)
RBC: 5.34 MIL/uL (ref 4.40–5.90)
RDW: 14.3 % (ref 11.5–14.5)
WBC: 10.2 10*3/uL (ref 3.8–10.6)

## 2017-03-19 LAB — BASIC METABOLIC PANEL
ANION GAP: 14 (ref 5–15)
BUN: 19 mg/dL (ref 6–20)
CHLORIDE: 94 mmol/L — AB (ref 101–111)
CO2: 27 mmol/L (ref 22–32)
Calcium: 9.5 mg/dL (ref 8.9–10.3)
Creatinine, Ser: 1.3 mg/dL — ABNORMAL HIGH (ref 0.61–1.24)
GFR calc non Af Amer: 56 mL/min — ABNORMAL LOW (ref >60)
Glucose, Bld: 209 mg/dL — ABNORMAL HIGH (ref 65–99)
POTASSIUM: 3.3 mmol/L — AB (ref 3.5–5.1)
SODIUM: 135 mmol/L (ref 135–145)

## 2017-03-19 LAB — TROPONIN I: Troponin I: <0.03 ng/mL (ref <0.03)

## 2017-03-19 MED ORDER — METOPROLOL SUCCINATE ER 50 MG PO TB24
50.0000 mg | ORAL_TABLET | Freq: Every day | ORAL | Status: DC
  Administered 2017-03-19: 50 mg via ORAL
  Filled 2017-03-19: qty 1

## 2017-03-19 MED ORDER — ADENOSINE 12 MG/4ML IV SOLN
INTRAVENOUS | Status: AC
  Filled 2017-03-19: qty 4

## 2017-03-19 MED ORDER — METOPROLOL SUCCINATE ER 50 MG PO TB24: 50.0000 mg | ORAL_TABLET | Freq: Every day | ORAL | 0 refills | Status: DC

## 2017-03-19 MED ORDER — ADENOSINE 6 MG/2ML IV SOLN
INTRAVENOUS | Status: AC
  Administered 2017-03-19: 6 mg via INTRAVENOUS
  Filled 2017-03-19: qty 2

## 2017-03-19 NOTE — ED Notes (Signed)
Sinus at 102 on monitor.  Iv fluids infusing.  Family with pt.  Pt alert.

## 2017-03-19 NOTE — ED Notes (Signed)
ED Provider at bedside. 
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
## 2017-03-19 NOTE — ED Notes (Signed)
Sinus tach on monitor.  Chest pain improved.  Iv fluid infusing.  Family with pt.  Skin warm and dry.

## 2017-03-19 NOTE — ED Notes (Signed)
Repeat ekg done  nsr on monitor.  

## 2017-03-19 NOTE — ED Notes (Signed)
Pt has been out of metoprolol for 2 days.

## 2017-03-19 NOTE — ED Triage Notes (Signed)
Pt via pov from home with chest pain radiating to left shoulder, back, neck. Pt reports sob as well. Pt alert & oriented.

## 2017-03-19 NOTE — ED Notes (Addendum)
Pt brought to room 12 from triage.  Pt in svt at 190.  Pt reports chest pain and sob.  Pt states it started while cooking today.  Pt states pain in center of chest.  No n/v/d.  No diaphoresis.  Pt reports feeling dizzy in the kitchen .   No dizziness now.  Iv started and labs sent  md at bedside.  meds given for rapid heart rate with good results.  Sinus tach on monitor at 122.  Pt alert.  Pt placed on 2 liters oxygen.

## 2017-04-02 ENCOUNTER — Ambulatory Visit (INDEPENDENT_AMBULATORY_CARE_PROVIDER_SITE_OTHER): Payer: Managed Care, Other (non HMO) | Admitting: Gastroenterology

## 2017-04-02 ENCOUNTER — Other Ambulatory Visit (INDEPENDENT_AMBULATORY_CARE_PROVIDER_SITE_OTHER): Payer: Managed Care, Other (non HMO)

## 2017-04-02 ENCOUNTER — Encounter: Payer: Self-pay | Admitting: Gastroenterology

## 2017-04-02 VITALS — BP 120/60 | HR 69 | Ht 69.0 in | Wt 258.0 lb

## 2017-04-02 DIAGNOSIS — K862 Cyst of pancreas: Secondary | ICD-10-CM | POA: Diagnosis not present

## 2017-04-02 DIAGNOSIS — K8020 Calculus of gallbladder without cholecystitis without obstruction: Secondary | ICD-10-CM

## 2017-04-02 DIAGNOSIS — R1013 Epigastric pain: Secondary | ICD-10-CM | POA: Diagnosis not present

## 2017-04-02 DIAGNOSIS — K219 Gastro-esophageal reflux disease without esophagitis: Secondary | ICD-10-CM

## 2017-04-02 DIAGNOSIS — I200 Unstable angina: Secondary | ICD-10-CM

## 2017-04-02 LAB — COMPREHENSIVE METABOLIC PANEL
ALT: 20 U/L (ref 0–53)
AST: 30 U/L (ref 0–37)
Albumin: 4.1 g/dL (ref 3.5–5.2)
Alkaline Phosphatase: 73 U/L (ref 39–117)
BILIRUBIN TOTAL: 0.7 mg/dL (ref 0.2–1.2)
BUN: 17 mg/dL (ref 6–23)
CALCIUM: 9.6 mg/dL (ref 8.4–10.5)
CO2: 31 meq/L (ref 19–32)
CREATININE: 1.3 mg/dL (ref 0.40–1.50)
Chloride: 97 mEq/L (ref 96–112)
GFR: 70.92 mL/min (ref >60.00)
GLUCOSE: 207 mg/dL — AB (ref 70–99)
Potassium: 3.2 mEq/L — ABNORMAL LOW (ref 3.5–5.1)
SODIUM: 137 meq/L (ref 135–145)
Total Protein: 7.9 g/dL (ref 6.0–8.3)

## 2017-04-02 LAB — LIPASE: LIPASE: 27 U/L (ref 11.0–59.0)
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
## 2017-04-02 NOTE — Patient Instructions (Addendum)
Go to the basement for labs today  We will contact you when to schedule your MRCP in 5 months   Follow up in 4 months

## 2017-04-03 ENCOUNTER — Other Ambulatory Visit: Payer: Self-pay

## 2017-04-03 ENCOUNTER — Emergency Department
Admission: EM | Admit: 2017-04-03 | Discharge: 2017-04-03 | Disposition: A | Discharge status: Home / Self Care | Payer: Managed Care, Other (non HMO) | Attending: Emergency Medicine | Admitting: Emergency Medicine

## 2017-04-03 ENCOUNTER — Encounter: Payer: Self-pay | Admitting: *Deleted

## 2017-04-03 DIAGNOSIS — Z87891 Personal history of nicotine dependence: Secondary | ICD-10-CM | POA: Diagnosis not present

## 2017-04-03 DIAGNOSIS — I2510 Atherosclerotic heart disease of native coronary artery without angina pectoris: Secondary | ICD-10-CM | POA: Diagnosis not present

## 2017-04-03 DIAGNOSIS — Z794 Long term (current) use of insulin: Secondary | ICD-10-CM | POA: Insufficient documentation

## 2017-04-03 DIAGNOSIS — Z7902 Long term (current) use of antithrombotics/antiplatelets: Secondary | ICD-10-CM | POA: Insufficient documentation

## 2017-04-03 DIAGNOSIS — I5032 Chronic diastolic (congestive) heart failure: Secondary | ICD-10-CM | POA: Diagnosis not present

## 2017-04-03 DIAGNOSIS — R000 Tachycardia, unspecified: Secondary | ICD-10-CM | POA: Diagnosis present

## 2017-04-03 DIAGNOSIS — Z79899 Other long term (current) drug therapy: Secondary | ICD-10-CM | POA: Insufficient documentation

## 2017-04-03 DIAGNOSIS — I110 Hypertensive heart disease with heart failure: Secondary | ICD-10-CM | POA: Diagnosis not present

## 2017-04-03 DIAGNOSIS — I252 Old myocardial infarction: Secondary | ICD-10-CM | POA: Insufficient documentation

## 2017-04-03 DIAGNOSIS — Z7982 Long term (current) use of aspirin: Secondary | ICD-10-CM | POA: Insufficient documentation

## 2017-04-03 DIAGNOSIS — E119 Type 2 diabetes mellitus without complications: Secondary | ICD-10-CM | POA: Diagnosis not present

## 2017-04-03 DIAGNOSIS — I471 Supraventricular tachycardia: Secondary | ICD-10-CM | POA: Diagnosis not present

## 2017-04-03 LAB — COMPREHENSIVE METABOLIC PANEL
ALK PHOS: 82 U/L (ref 38–126)
ALT: 24 U/L (ref 17–63)
AST: 47 U/L — AB (ref 15–41)
Albumin: 3.9 g/dL (ref 3.5–5.0)
Anion gap: 15 (ref 5–15)
BUN: 19 mg/dL (ref 6–20)
CALCIUM: 9.3 mg/dL (ref 8.9–10.3)
CHLORIDE: 97 mmol/L — AB (ref 101–111)
CO2: 25 mmol/L (ref 22–32)
CREATININE: 1.38 mg/dL — AB (ref 0.61–1.24)
GFR, EST AFRICAN AMERICAN: 60 mL/min — AB (ref >60)
GFR, EST NON AFRICAN AMERICAN: 52 mL/min — AB (ref >60)
Glucose, Bld: 184 mg/dL — ABNORMAL HIGH (ref 65–99)
Potassium: 3 mmol/L — ABNORMAL LOW (ref 3.5–5.1)
SODIUM: 137 mmol/L (ref 135–145)
Total Bilirubin: 1 mg/dL (ref 0.3–1.2)
Total Protein: 8.3 g/dL — ABNORMAL HIGH (ref 6.5–8.1)

## 2017-04-03 LAB — CBC WITH DIFFERENTIAL/PLATELET
BASOS PCT: 1 %
Basophils Absolute: 0.1 10*3/uL (ref 0–0.1)
EOS ABS: 0.1 10*3/uL (ref 0–0.7)
EOS PCT: 1 %
HCT: 48.1 % (ref 40.0–52.0)
HEMOGLOBIN: 16.1 g/dL (ref 13.0–18.0)
LYMPHS ABS: 3.6 10*3/uL (ref 1.0–3.6)
Lymphocytes Relative: 35 %
MCH: 28.3 pg (ref 26.0–34.0)
MCHC: 33.5 g/dL (ref 32.0–36.0)
MCV: 84.7 fL (ref 80.0–100.0)
MONO ABS: 0.7 10*3/uL (ref 0.2–1.0)
MONOS PCT: 7 %
NEUTROS PCT: 56 %
Neutro Abs: 5.7 10*3/uL (ref 1.4–6.5)
PLATELETS: 315 10*3/uL (ref 150–440)
RBC: 5.67 MIL/uL (ref 4.40–5.90)
RDW: 14.2 % (ref 11.5–14.5)
WBC: 10.1 10*3/uL (ref 3.8–10.6)

## 2017-04-03 MED ORDER — ADENOSINE 12 MG/4ML IV SOLN
INTRAVENOUS | Status: AC
  Filled 2017-04-03: qty 4

## 2017-04-03 MED ORDER — ADENOSINE 6 MG/2ML IV SOLN
INTRAVENOUS | Status: AC
  Administered 2017-04-03: 6 mg
  Filled 2017-04-03: qty 2

## 2017-04-03 MED ORDER — SODIUM CHLORIDE 0.9 % IV BOLUS (SEPSIS)
1000.0000 mL | Freq: Once | INTRAVENOUS | Status: AC
  Administered 2017-04-03: 1000 mL via INTRAVENOUS

## 2017-04-03 NOTE — ED Triage Notes (Signed)
Pt has  A history of SVT, pt woke up with his heart racing, pt denies pain or any other symptoms

## 2017-04-03 NOTE — ED Notes (Signed)
Pt discharged home after verbalizing understanding of discharge instructions; nad noted. 
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
## 2017-04-03 NOTE — Discharge Instructions (Signed)
Please call your cardiologist to schedule an appointment within the next week for reevaluation.  Return to the emergency department sooner for any concerns whatsoever.  It was a pleasure to take care of you today, and thank you for coming to our emergency department.  If you have any questions or concerns before leaving please ask the nurse to grab me and I'm more than happy to go through your aftercare instructions again.  If you were prescribed any opioid pain medication today such as Norco, Vicodin, Percocet, morphine, hydrocodone, or oxycodone please make sure you do not drive when you are taking this medication as it can alter your ability to drive safely.  If you have any concerns once you are home that you are not improving or are in fact getting worse before you can make it to your follow-up appointment, please do not hesitate to call 911 and come back for further evaluation.  Darel Hong, MD  Results for orders placed or performed during the hospital encounter of 04/03/17  Comprehensive metabolic panel  Result Value Ref Range   Sodium 137 135 - 145 mmol/L   Potassium 3.0 (L) 3.5 - 5.1 mmol/L   Chloride 97 (L) 101 - 111 mmol/L   CO2 25 22 - 32 mmol/L   Glucose, Bld 184 (H) 65 - 99 mg/dL   BUN 19 6 - 20 mg/dL   Creatinine, Ser 1.38 (H) 0.61 - 1.24 mg/dL   Calcium 9.3 8.9 - 10.3 mg/dL   Total Protein 8.3 (H) 6.5 - 8.1 g/dL   Albumin 3.9 3.5 - 5.0 g/dL   AST 47 (H) 15 - 41 U/L   ALT 24 17 - 63 U/L   Alkaline Phosphatase 82 38 - 126 U/L   Total Bilirubin 1.0 0.3 - 1.2 mg/dL   GFR calc non Af Amer 52 (L) >60 mL/min   GFR calc Af Amer 60 (L) >60 mL/min   Anion gap 15 5 - 15  CBC with Differential  Result Value Ref Range   WBC 10.1 3.8 - 10.6 K/uL   RBC 5.67 4.40 - 5.90 MIL/uL   Hemoglobin 16.1 13.0 - 18.0 g/dL   HCT 48.1 40.0 - 52.0 %   MCV 84.7 80.0 - 100.0 fL   MCH 28.3 26.0 - 34.0 pg   MCHC 33.5 32.0 - 36.0 g/dL   RDW 14.2 11.5 - 14.5 %   Platelets 315 150 - 440 K/uL     Neutrophils Relative % 56 %   Neutro Abs 5.7 1.4 - 6.5 K/uL   Lymphocytes Relative 35 %   Lymphs Abs 3.6 1.0 - 3.6 K/uL   Monocytes Relative 7 %   Monocytes Absolute 0.7 0.2 - 1.0 K/uL   Eosinophils Relative 1 %   Eosinophils Absolute 0.1 0 - 0.7 K/uL   Basophils Relative 1 %   Basophils Absolute 0.1 0 - 0.1 K/uL   Dg Chest Port 1 View  Result Date: 03/19/2017 CLINICAL DATA:  PET-CT.  Chest pain shortness of breath. EXAM: PORTABLE CHEST 1 VIEW COMPARISON:  01/04/2017 . FINDINGS: Mediastinum is stable. Heart size stable. Mild basilar atelectasis. No pleural effusion or pneumothorax. IMPRESSION: Mild basilar atelectasis, otherwise negative chest. Electronically Signed   By: Marcello Moores  Register   On: 03/19/2017 16:32   Mr Abdomen Mrcp W Wo Contast  Result Date: 03/13/2017 CLINICAL DATA:  Symptomatic cholelithiasis. Elevated liver function tests. EXAM: MRI ABDOMEN WITHOUT AND WITH CONTRAST (INCLUDING MRCP) TECHNIQUE: Multiplanar multisequence MR imaging of the abdomen was performed  both before and after the administration of intravenous contrast. Heavily T2-weighted images of the biliary and pancreatic ducts were obtained, and three-dimensional MRCP images were rendered by post processing. Creatinine was obtained on site at [facility] at [address] Results: Creatinine 1.2 mg/dL. CONTRAST:  24mL MULTIHANCE GADOBENATE DIMEGLUMINE 529 MG/ML IV SOLN COMPARISON:  02/08/2017 abdominal sonogram. FINDINGS: Lower chest: No acute abnormality at the lung bases. Hepatobiliary: Normal liver size and configuration. Diffuse hepatic steatosis. No liver mass. There a few layering subcentimeter gallstones in the nondistended gallbladder, with no gallbladder wall thickening or pericholecystic fluid. No biliary ductal dilatation. Common bile duct diameter 4 mm. No evidence of choledocholithiasis. No biliary or ampullary mass. Pancreas: There is a 1.9 x 1.3 x 1.7 cm cystic pancreatic mass in the anterior  distal pancreatic body (series 12/image 16), which demonstrates a lobular contour, a few thin internal septations and no solid enhancement or wall thickening. No additional pancreatic lesions. No pancreatic duct dilation. No pancreas divisum. Spleen: Normal size. No mass. Adrenals/Urinary Tract: Normal adrenals. No hydronephrosis. There is a subcentimeter simple renal cyst in the lower left kidney. No suspicious renal masses. Stomach/Bowel: Grossly normal stomach. Visualized small and large bowel is normal caliber, with no bowel wall thickening. Vascular/Lymphatic: Normal caliber abdominal aorta. Patent portal, splenic, hepatic and renal veins. No pathologically enlarged lymph nodes in the abdomen. Other: No abdominal ascites or focal fluid collection. Musculoskeletal: No aggressive appearing focal osseous lesions. IMPRESSION: 1. Cholelithiasis.  No MRI findings of acute cholecystitis. 2. No biliary ductal dilatation. CBD diameter 4 mm. No evidence of choledocholithiasis. 3. Cystic 1.9 cm pancreatic mass in the anterior distal pancreatic body, without high risk MRI features. Follow-up MRI abdomen without and with IV contrast recommended in 6 months. This recommendation follows ACR consensus guidelines: Management of Incidental Pancreatic Cysts: A White Paper of the ACR Incidental Findings Committee. J Am Coll Radiol 3491;79:150-569. 4. Diffuse hepatic steatosis. Electronically Signed   By: Ilona Sorrel M.D.   On: 03/13/2017 08:37

## 2017-04-03 NOTE — ED Notes (Signed)
Pt presents in svt at 180 bpm; this has happened 5-6 x before. PT has cardiology appointment next week. Pt is on metoprolol for HR issues. Pt alert & oriented, denies pain or sob. States it feels like "I can feel my heart beating through my clothes."

## 2017-04-25 DIAGNOSIS — E1169 Type 2 diabetes mellitus with other specified complication: Secondary | ICD-10-CM | POA: Insufficient documentation

## 2017-04-25 DIAGNOSIS — E785 Hyperlipidemia, unspecified: Secondary | ICD-10-CM

## 2017-07-09 ENCOUNTER — Telehealth: Payer: Self-pay | Admitting: Internal Medicine

## 2017-07-09 NOTE — Telephone Encounter (Signed)
Wrong office

## 2017-07-09 NOTE — Telephone Encounter (Signed)
Copied from [place]. Topic: General - Other >> Jul 09, 2017  8:30 AM Darl Householder, RMA wrote: Reason for CRM: Medication refill request for Insulin Glargine (LANTUS SOLOSTAR) 100 UNIT/ML Solostar Pen to be sent to [facility]

## 2017-07-16 ENCOUNTER — Emergency Department
Admission: EM | Admit: 2017-07-16 | Discharge: 2017-07-16 | Disposition: A | Discharge status: Home / Self Care | Payer: Medicare HMO | Attending: Emergency Medicine | Admitting: Emergency Medicine

## 2017-07-16 ENCOUNTER — Emergency Department: Payer: Medicare HMO

## 2017-07-16 ENCOUNTER — Encounter: Payer: Self-pay | Admitting: Intensive Care

## 2017-07-16 ENCOUNTER — Other Ambulatory Visit: Payer: Self-pay

## 2017-07-16 DIAGNOSIS — Z955 Presence of coronary angioplasty implant and graft: Secondary | ICD-10-CM | POA: Diagnosis not present

## 2017-07-16 DIAGNOSIS — S93602A Unspecified sprain of left foot, initial encounter: Secondary | ICD-10-CM | POA: Insufficient documentation

## 2017-07-16 DIAGNOSIS — E119 Type 2 diabetes mellitus without complications: Secondary | ICD-10-CM | POA: Insufficient documentation

## 2017-07-16 DIAGNOSIS — Z794 Long term (current) use of insulin: Secondary | ICD-10-CM | POA: Diagnosis not present

## 2017-07-16 DIAGNOSIS — Z8673 Personal history of transient ischemic attack (TIA), and cerebral infarction without residual deficits: Secondary | ICD-10-CM | POA: Insufficient documentation

## 2017-07-16 DIAGNOSIS — X509XXA Other and unspecified overexertion or strenuous movements or postures, initial encounter: Secondary | ICD-10-CM | POA: Insufficient documentation

## 2017-07-16 DIAGNOSIS — Y9389 Activity, other specified: Secondary | ICD-10-CM | POA: Insufficient documentation

## 2017-07-16 DIAGNOSIS — I252 Old myocardial infarction: Secondary | ICD-10-CM | POA: Insufficient documentation

## 2017-07-16 DIAGNOSIS — Z96652 Presence of left artificial knee joint: Secondary | ICD-10-CM | POA: Insufficient documentation

## 2017-07-16 DIAGNOSIS — Z7982 Long term (current) use of aspirin: Secondary | ICD-10-CM | POA: Insufficient documentation

## 2017-07-16 DIAGNOSIS — I110 Hypertensive heart disease with heart failure: Secondary | ICD-10-CM | POA: Insufficient documentation

## 2017-07-16 DIAGNOSIS — Z7902 Long term (current) use of antithrombotics/antiplatelets: Secondary | ICD-10-CM | POA: Diagnosis not present

## 2017-07-16 DIAGNOSIS — Y929 Unspecified place or not applicable: Secondary | ICD-10-CM | POA: Diagnosis not present

## 2017-07-16 DIAGNOSIS — Z79899 Other long term (current) drug therapy: Secondary | ICD-10-CM | POA: Diagnosis not present

## 2017-07-16 DIAGNOSIS — Y998 Other external cause status: Secondary | ICD-10-CM | POA: Insufficient documentation

## 2017-07-16 DIAGNOSIS — I5032 Chronic diastolic (congestive) heart failure: Secondary | ICD-10-CM | POA: Insufficient documentation

## 2017-07-16 DIAGNOSIS — Z87891 Personal history of nicotine dependence: Secondary | ICD-10-CM | POA: Diagnosis not present

## 2017-07-16 DIAGNOSIS — S99922A Unspecified injury of left foot, initial encounter: Secondary | ICD-10-CM | POA: Diagnosis present

## 2017-07-16 DIAGNOSIS — I2510 Atherosclerotic heart disease of native coronary artery without angina pectoris: Secondary | ICD-10-CM | POA: Diagnosis not present

## 2017-07-16 DIAGNOSIS — Z96651 Presence of right artificial knee joint: Secondary | ICD-10-CM | POA: Diagnosis not present

## 2017-07-16 MED ORDER — OXYCODONE-ACETAMINOPHEN 5-325 MG PO TABS
1.0000 | ORAL_TABLET | Freq: Once | ORAL | Status: AC
  Administered 2017-07-16: 1 via ORAL
  Filled 2017-07-16: qty 1

## 2017-07-16 NOTE — ED Notes (Signed)
See triage note   presents with left ankle pain  States he twisted it this am  Having increased pain  Good pulses

## 2017-07-16 NOTE — ED Notes (Signed)
First Nurse Note:  Patient states he twisted his ankle this AM.  Complaining of left ankle pain.

## 2017-07-16 NOTE — ED Notes (Signed)
First Nurse Note:  Radiology staff here to take patient to radiology.

## 2017-07-16 NOTE — ED Triage Notes (Signed)
Patient twisted L foot/ankle this morning and now experiencing pain. Unable to put full body weight on ankle
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
## 2017-08-02 ENCOUNTER — Ambulatory Visit: Payer: Medicare Other | Admitting: Podiatry

## 2017-08-03 ENCOUNTER — Emergency Department: Payer: Medicare HMO

## 2017-08-03 ENCOUNTER — Encounter: Payer: Self-pay | Admitting: Emergency Medicine

## 2017-08-03 ENCOUNTER — Emergency Department
Admission: EM | Admit: 2017-08-03 | Discharge: 2017-08-03 | Disposition: A | Discharge status: Home / Self Care | Payer: Medicare HMO | Attending: Emergency Medicine | Admitting: Emergency Medicine

## 2017-08-03 DIAGNOSIS — Z7982 Long term (current) use of aspirin: Secondary | ICD-10-CM | POA: Diagnosis not present

## 2017-08-03 DIAGNOSIS — I471 Supraventricular tachycardia: Secondary | ICD-10-CM | POA: Diagnosis not present

## 2017-08-03 DIAGNOSIS — E785 Hyperlipidemia, unspecified: Secondary | ICD-10-CM | POA: Diagnosis not present

## 2017-08-03 DIAGNOSIS — M79645 Pain in left finger(s): Secondary | ICD-10-CM | POA: Insufficient documentation

## 2017-08-03 DIAGNOSIS — Z79899 Other long term (current) drug therapy: Secondary | ICD-10-CM | POA: Insufficient documentation

## 2017-08-03 DIAGNOSIS — Z87891 Personal history of nicotine dependence: Secondary | ICD-10-CM | POA: Diagnosis not present

## 2017-08-03 DIAGNOSIS — I5032 Chronic diastolic (congestive) heart failure: Secondary | ICD-10-CM | POA: Insufficient documentation

## 2017-08-03 DIAGNOSIS — Z794 Long term (current) use of insulin: Secondary | ICD-10-CM | POA: Insufficient documentation

## 2017-08-03 DIAGNOSIS — Z8673 Personal history of transient ischemic attack (TIA), and cerebral infarction without residual deficits: Secondary | ICD-10-CM | POA: Diagnosis not present

## 2017-08-03 DIAGNOSIS — I252 Old myocardial infarction: Secondary | ICD-10-CM | POA: Insufficient documentation

## 2017-08-03 DIAGNOSIS — E119 Type 2 diabetes mellitus without complications: Secondary | ICD-10-CM | POA: Insufficient documentation

## 2017-08-03 DIAGNOSIS — I110 Hypertensive heart disease with heart failure: Secondary | ICD-10-CM | POA: Insufficient documentation

## 2017-08-03 DIAGNOSIS — I2510 Atherosclerotic heart disease of native coronary artery without angina pectoris: Secondary | ICD-10-CM | POA: Insufficient documentation

## 2017-08-03 DIAGNOSIS — Z7902 Long term (current) use of antithrombotics/antiplatelets: Secondary | ICD-10-CM | POA: Insufficient documentation

## 2017-08-03 LAB — CBC WITH DIFFERENTIAL/PLATELET
BASOS ABS: 0.1 10*3/uL (ref 0–0.1)
BASOS PCT: 1 %
EOS ABS: 0.2 10*3/uL (ref 0–0.7)
Eosinophils Relative: 2 %
HCT: 43.1 % (ref 40.0–52.0)
HEMOGLOBIN: 15 g/dL (ref 13.0–18.0)
Lymphocytes Relative: 31 %
Lymphs Abs: 3.2 10*3/uL (ref 1.0–3.6)
MCH: 29 pg (ref 26.0–34.0)
MCHC: 34.7 g/dL (ref 32.0–36.0)
MCV: 83.5 fL (ref 80.0–100.0)
Monocytes Absolute: 0.8 10*3/uL (ref 0.2–1.0)
Monocytes Relative: 8 %
NEUTROS PCT: 58 %
Neutro Abs: 6.1 10*3/uL (ref 1.4–6.5)
Platelets: 249 10*3/uL (ref 150–440)
RBC: 5.16 MIL/uL (ref 4.40–5.90)
RDW: 14.7 % — ABNORMAL HIGH (ref 11.5–14.5)
WBC: 10.3 10*3/uL (ref 3.8–10.6)

## 2017-08-03 LAB — COMPREHENSIVE METABOLIC PANEL
ALT: 25 U/L (ref 17–63)
AST: 45 U/L — ABNORMAL HIGH (ref 15–41)
Albumin: 4 g/dL (ref 3.5–5.0)
Alkaline Phosphatase: 54 U/L (ref 38–126)
Anion gap: 13 (ref 5–15)
BUN: 12 mg/dL (ref 6–20)
CALCIUM: 8.7 mg/dL — AB (ref 8.9–10.3)
CO2: 21 mmol/L — ABNORMAL LOW (ref 22–32)
CREATININE: 1.03 mg/dL (ref 0.61–1.24)
Chloride: 102 mmol/L (ref 101–111)
GFR calc non Af Amer: >60 mL/min (ref >60)
Glucose, Bld: 223 mg/dL — ABNORMAL HIGH (ref 65–99)
Potassium: 3.7 mmol/L (ref 3.5–5.1)
Sodium: 136 mmol/L (ref 135–145)
TOTAL PROTEIN: 7.7 g/dL (ref 6.5–8.1)
Total Bilirubin: 0.6 mg/dL (ref 0.3–1.2)

## 2017-08-03 MED ORDER — TRAMADOL HCL 50 MG PO TABS: 50.0000 mg | ORAL_TABLET | Freq: Four times a day (QID) | ORAL | 0 refills | Status: AC | PRN

## 2017-08-03 MED ORDER — CLINDAMYCIN HCL 150 MG PO CAPS
300.0000 mg | ORAL_CAPSULE | Freq: Once | ORAL | Status: AC
  Administered 2017-08-03: 300 mg via ORAL
  Filled 2017-08-03: qty 2

## 2017-08-03 MED ORDER — OXYCODONE-ACETAMINOPHEN 5-325 MG PO TABS
1.0000 | ORAL_TABLET | Freq: Once | ORAL | Status: AC
  Administered 2017-08-03: 1 via ORAL
  Filled 2017-08-03: qty 1

## 2017-08-03 MED ORDER — CLINDAMYCIN HCL 300 MG PO CAPS: 300.0000 mg | ORAL_CAPSULE | Freq: Three times a day (TID) | ORAL | 0 refills | Status: AC

## 2017-08-03 NOTE — Discharge Instructions (Signed)
You may use warm compresses or a warm (but not hot) water bath a few times a day.  Return to the emergency department for new or worsening pain, swelling, redness, streaking up the arm or hand, fevers or chills, or any other new or worsening symptoms that concern you.  Take the antibiotic as prescribed and finish the full course.
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
## 2017-08-03 NOTE — ED Triage Notes (Signed)
Pt comes into the ED via POV c/o finger pain to the left thumb.  Patient is diabetic and states he pulled at some skin the other day and he now believes the thumb is infected.  Patient has soaked the thumb in alcohol and had no relief.  Patient states his sugars have been running well the past couple of days.  Patient has some swelling and hardness noted to the thumb, but no excess heat or redness.

## 2017-08-04 ENCOUNTER — Emergency Department
Admission: EM | Admit: 2017-08-04 | Discharge: 2017-08-04 | Disposition: A | Discharge status: Other Acute Inpatient Hospital | Payer: Medicare HMO | Attending: Emergency Medicine | Admitting: Emergency Medicine

## 2017-08-04 ENCOUNTER — Encounter: Payer: Self-pay | Admitting: Emergency Medicine

## 2017-08-04 DIAGNOSIS — L03012 Cellulitis of left finger: Secondary | ICD-10-CM

## 2017-08-04 DIAGNOSIS — L03114 Cellulitis of left upper limb: Secondary | ICD-10-CM | POA: Insufficient documentation

## 2017-08-04 DIAGNOSIS — Z794 Long term (current) use of insulin: Secondary | ICD-10-CM | POA: Insufficient documentation

## 2017-08-04 DIAGNOSIS — Z79899 Other long term (current) drug therapy: Secondary | ICD-10-CM | POA: Insufficient documentation

## 2017-08-04 DIAGNOSIS — I5032 Chronic diastolic (congestive) heart failure: Secondary | ICD-10-CM | POA: Diagnosis not present

## 2017-08-04 DIAGNOSIS — Z87891 Personal history of nicotine dependence: Secondary | ICD-10-CM | POA: Insufficient documentation

## 2017-08-04 DIAGNOSIS — Z7982 Long term (current) use of aspirin: Secondary | ICD-10-CM | POA: Insufficient documentation

## 2017-08-04 DIAGNOSIS — I110 Hypertensive heart disease with heart failure: Secondary | ICD-10-CM | POA: Insufficient documentation

## 2017-08-04 DIAGNOSIS — Z7902 Long term (current) use of antithrombotics/antiplatelets: Secondary | ICD-10-CM | POA: Insufficient documentation

## 2017-08-04 DIAGNOSIS — E119 Type 2 diabetes mellitus without complications: Secondary | ICD-10-CM | POA: Diagnosis not present

## 2017-08-04 LAB — CBC WITH DIFFERENTIAL/PLATELET
BASOS ABS: 0.1 10*3/uL (ref 0–0.1)
BASOS PCT: 1 %
Eosinophils Absolute: 0.1 10*3/uL (ref 0–0.7)
Eosinophils Relative: 1 %
HCT: 44.4 % (ref 40.0–52.0)
Hemoglobin: 15 g/dL (ref 13.0–18.0)
Lymphocytes Relative: 18 %
Lymphs Abs: 2.4 10*3/uL (ref 1.0–3.6)
MCH: 28.7 pg (ref 26.0–34.0)
MCHC: 33.7 g/dL (ref 32.0–36.0)
MCV: 85.1 fL (ref 80.0–100.0)
MONO ABS: 1.1 10*3/uL — AB (ref 0.2–1.0)
Monocytes Relative: 8 %
Neutro Abs: 9.6 10*3/uL — ABNORMAL HIGH (ref 1.4–6.5)
Neutrophils Relative %: 72 %
Platelets: 255 10*3/uL (ref 150–440)
RBC: 5.22 MIL/uL (ref 4.40–5.90)
RDW: 14.6 % — AB (ref 11.5–14.5)
WBC: 13.3 10*3/uL — ABNORMAL HIGH (ref 3.8–10.6)

## 2017-08-04 LAB — COMPREHENSIVE METABOLIC PANEL
ALBUMIN: 3.8 g/dL (ref 3.5–5.0)
ALK PHOS: 51 U/L (ref 38–126)
ALT: 21 U/L (ref 17–63)
ANION GAP: 12 (ref 5–15)
AST: 34 U/L (ref 15–41)
BILIRUBIN TOTAL: 1.7 mg/dL — AB (ref 0.3–1.2)
BUN: 12 mg/dL (ref 6–20)
CALCIUM: 9 mg/dL (ref 8.9–10.3)
CO2: 23 mmol/L (ref 22–32)
Chloride: 102 mmol/L (ref 101–111)
Creatinine, Ser: 1.15 mg/dL (ref 0.61–1.24)
GFR calc Af Amer: >60 mL/min (ref >60)
GFR calc non Af Amer: >60 mL/min (ref >60)
GLUCOSE: 177 mg/dL — AB (ref 65–99)
Potassium: 3.5 mmol/L (ref 3.5–5.1)
Sodium: 137 mmol/L (ref 135–145)
TOTAL PROTEIN: 8.1 g/dL (ref 6.5–8.1)

## 2017-08-04 LAB — LACTIC ACID, PLASMA
Lactic Acid, Venous: 2.6 mmol/L (ref 0.5–1.9)
Lactic Acid, Venous: 1.4 mmol/L (ref 0.5–1.9)

## 2017-08-04 MED ORDER — MORPHINE SULFATE (PF) 4 MG/ML IV SOLN
4.0000 mg | Freq: Once | INTRAVENOUS | Status: AC
  Administered 2017-08-04: 4 mg via INTRAVENOUS
  Filled 2017-08-04: qty 1

## 2017-08-04 MED ORDER — ONDANSETRON HCL 4 MG/2ML IJ SOLN
4.0000 mg | Freq: Once | INTRAMUSCULAR | Status: AC
  Administered 2017-08-04: 4 mg via INTRAVENOUS
  Filled 2017-08-04: qty 2

## 2017-08-04 MED ORDER — SODIUM CHLORIDE 0.9 % IV BOLUS (SEPSIS)
1000.0000 mL | Freq: Once | INTRAVENOUS | Status: AC
  Administered 2017-08-04: 1000 mL via INTRAVENOUS

## 2017-08-04 MED ORDER — PIPERACILLIN-TAZOBACTAM 3.375 G IVPB 30 MIN
3.3750 g | Freq: Once | INTRAVENOUS | Status: AC
Start: 2017-08-04 — End: 2017-08-04
  Administered 2017-08-04: 3.375 g via INTRAVENOUS
  Filled 2017-08-04: qty 50

## 2017-08-04 MED ORDER — VANCOMYCIN HCL IN DEXTROSE 1-5 GM/200ML-% IV SOLN
1000.0000 mg | Freq: Once | INTRAVENOUS | Status: AC
  Administered 2017-08-04: 1000 mg via INTRAVENOUS
  Filled 2017-08-04: qty 200

## 2017-08-04 MED ORDER — HYDROMORPHONE HCL 1 MG/ML IJ SOLN
1.0000 mg | Freq: Once | INTRAMUSCULAR | Status: AC
  Administered 2017-08-04: 1 mg via INTRAVENOUS
  Filled 2017-08-04: qty 1
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
## 2017-08-04 NOTE — ED Triage Notes (Signed)
Pt comes into the ED via POV c/o progression of infection to the left thumb.  Patient seen here Friday night and had basic blood work completed that was unremarkable.  MC thought it may be early stages of cellulitis.  Patient now has visible wound to the thumb, increased redness, increased swelling to the thumb and hand, and extreme heat from the hand.  Patient has even and unlabored respirations at this time.

## 2017-08-04 NOTE — ED Notes (Signed)
Attempted to call report to [facility] x2. Unsuccessful.

## 2017-08-04 NOTE — ED Notes (Signed)
emtala reviewed by this RN 

## 2017-08-09 DIAGNOSIS — M659 Synovitis and tenosynovitis, unspecified: Secondary | ICD-10-CM | POA: Insufficient documentation

## 2017-08-09 LAB — CULTURE, BLOOD (ROUTINE X 2)
Culture: NO GROWTH
Culture: NO GROWTH
SPECIAL REQUESTS: ADEQUATE
Special Requests: ADEQUATE

## 2017-08-23 ENCOUNTER — Ambulatory Visit (INDEPENDENT_AMBULATORY_CARE_PROVIDER_SITE_OTHER): Payer: Medicare HMO | Admitting: Podiatry

## 2017-08-23 ENCOUNTER — Encounter: Payer: Self-pay | Admitting: Podiatry

## 2017-08-23 DIAGNOSIS — M79676 Pain in unspecified toe(s): Secondary | ICD-10-CM

## 2017-08-23 DIAGNOSIS — E0843 Diabetes mellitus due to underlying condition with diabetic autonomic (poly)neuropathy: Secondary | ICD-10-CM | POA: Diagnosis not present

## 2017-08-23 DIAGNOSIS — B351 Tinea unguium: Secondary | ICD-10-CM

## 2017-08-26 NOTE — Progress Notes (Signed)
   SUBJECTIVE Patient with a history of diabetes mellitus presents to office today complaining of elongated, thickened nails that cause pain while ambulating in shoes. He is unable to trim his own nails. Patient is here for further evaluation and treatment.   Past Medical History:  Diagnosis Date  . Arthritis   . Chronic diastolic CHF (congestive heart failure) ([place])    a. 04/2014 Echo: EF 55%, Gr1 DD.  [name] Coronary artery disease    a. 2006 s/p PCI RCA;  b. 2012 MV: no ischemia.  . Diabetes mellitus without complication ([place])   . GERD (gastroesophageal reflux disease)   . History of gout   . Hyperlipidemia   . Hypertension   . MI, old 76  . Stroke ([place])   . SVT (supraventricular tachycardia) ([place])     OBJECTIVE General Patient is awake, alert, and oriented x 3 and in no acute distress. Derm Skin is dry and supple bilateral. Negative open lesions or macerations. Remaining integument unremarkable. Nails are tender, long, thickened and dystrophic with subungual debris, consistent with onychomycosis, 1-5 bilateral. No signs of infection noted. Vasc  DP and PT pedal pulses palpable bilaterally. Temperature gradient within normal limits.  Neuro Epicritic and protective threshold sensation diminished bilaterally.  Musculoskeletal Exam No symptomatic pedal deformities noted bilateral. Muscular strength within normal limits.  ASSESSMENT 1. Diabetes Mellitus w/ peripheral neuropathy 2. Onychomycosis of nail due to dermatophyte bilateral 3. Pain in foot bilateral  PLAN OF CARE 1. Patient evaluated today. 2. Instructed to maintain good pedal hygiene and foot care. Stressed importance of controlling blood sugar.  3. Mechanical debridement of nails 1-5 bilaterally performed using a nail nipper. Filed with dremel without incident.  4. Return to clinic in 3 mos.     Edrick Kins, DPM [facility]  Dr. Edrick Kins, [address]                                         [place], [place] [address]                Office [phone]  Fax [phone]

## 2017-09-07 ENCOUNTER — Emergency Department: Payer: Medicare HMO

## 2017-09-07 ENCOUNTER — Emergency Department
Admission: EM | Admit: 2017-09-07 | Discharge: 2017-09-07 | Disposition: A | Discharge status: Home / Self Care | Payer: Medicare HMO | Attending: Emergency Medicine | Admitting: Emergency Medicine

## 2017-09-07 ENCOUNTER — Encounter: Payer: Self-pay | Admitting: Internal Medicine

## 2017-09-07 ENCOUNTER — Other Ambulatory Visit: Payer: Self-pay

## 2017-09-07 DIAGNOSIS — Z794 Long term (current) use of insulin: Secondary | ICD-10-CM | POA: Insufficient documentation

## 2017-09-07 DIAGNOSIS — Z7902 Long term (current) use of antithrombotics/antiplatelets: Secondary | ICD-10-CM | POA: Insufficient documentation

## 2017-09-07 DIAGNOSIS — E119 Type 2 diabetes mellitus without complications: Secondary | ICD-10-CM | POA: Insufficient documentation

## 2017-09-07 DIAGNOSIS — Z87891 Personal history of nicotine dependence: Secondary | ICD-10-CM | POA: Diagnosis not present

## 2017-09-07 DIAGNOSIS — I110 Hypertensive heart disease with heart failure: Secondary | ICD-10-CM | POA: Insufficient documentation

## 2017-09-07 DIAGNOSIS — I252 Old myocardial infarction: Secondary | ICD-10-CM | POA: Diagnosis not present

## 2017-09-07 DIAGNOSIS — Z79899 Other long term (current) drug therapy: Secondary | ICD-10-CM | POA: Insufficient documentation

## 2017-09-07 DIAGNOSIS — Z955 Presence of coronary angioplasty implant and graft: Secondary | ICD-10-CM | POA: Insufficient documentation

## 2017-09-07 DIAGNOSIS — Z96653 Presence of artificial knee joint, bilateral: Secondary | ICD-10-CM | POA: Diagnosis not present

## 2017-09-07 DIAGNOSIS — Z7982 Long term (current) use of aspirin: Secondary | ICD-10-CM | POA: Insufficient documentation

## 2017-09-07 DIAGNOSIS — I5032 Chronic diastolic (congestive) heart failure: Secondary | ICD-10-CM | POA: Diagnosis not present

## 2017-09-07 DIAGNOSIS — I2510 Atherosclerotic heart disease of native coronary artery without angina pectoris: Secondary | ICD-10-CM | POA: Diagnosis not present

## 2017-09-07 DIAGNOSIS — J441 Chronic obstructive pulmonary disease with (acute) exacerbation: Secondary | ICD-10-CM | POA: Diagnosis not present

## 2017-09-07 DIAGNOSIS — R609 Edema, unspecified: Secondary | ICD-10-CM

## 2017-09-07 DIAGNOSIS — R0602 Shortness of breath: Secondary | ICD-10-CM | POA: Diagnosis present

## 2017-09-07 LAB — COMPREHENSIVE METABOLIC PANEL
ALT: 19 U/L (ref 17–63)
AST: 43 U/L — ABNORMAL HIGH (ref 15–41)
Albumin: 3.6 g/dL (ref 3.5–5.0)
Alkaline Phosphatase: 51 U/L (ref 38–126)
Anion gap: 9 (ref 5–15)
BUN: 12 mg/dL (ref 6–20)
CO2: 24 mmol/L (ref 22–32)
Calcium: 8.8 mg/dL — ABNORMAL LOW (ref 8.9–10.3)
Chloride: 103 mmol/L (ref 101–111)
Creatinine, Ser: 1.13 mg/dL (ref 0.61–1.24)
GFR calc Af Amer: >60 mL/min (ref >60)
GFR calc non Af Amer: >60 mL/min (ref >60)
Glucose, Bld: 271 mg/dL — ABNORMAL HIGH (ref 65–99)
Potassium: 3.7 mmol/L (ref 3.5–5.1)
Sodium: 136 mmol/L (ref 135–145)
Total Bilirubin: 0.5 mg/dL (ref 0.3–1.2)
Total Protein: 7.1 g/dL (ref 6.5–8.1)

## 2017-09-07 LAB — CBC WITH DIFFERENTIAL/PLATELET
BASOS ABS: 0.1 10*3/uL (ref 0–0.1)
Basophils Relative: 1 %
Eosinophils Absolute: 0.1 10*3/uL (ref 0–0.7)
Eosinophils Relative: 2 %
HEMATOCRIT: 41.5 % (ref 40.0–52.0)
HEMOGLOBIN: 14 g/dL (ref 13.0–18.0)
LYMPHS PCT: 48 %
Lymphs Abs: 3.2 10*3/uL (ref 1.0–3.6)
MCH: 28.8 pg (ref 26.0–34.0)
MCHC: 33.6 g/dL (ref 32.0–36.0)
MCV: 85.5 fL (ref 80.0–100.0)
Monocytes Absolute: 0.5 10*3/uL (ref 0.2–1.0)
Monocytes Relative: 7 %
NEUTROS ABS: 2.8 10*3/uL (ref 1.4–6.5)
NEUTROS PCT: 42 %
PLATELETS: 212 10*3/uL (ref 150–440)
RBC: 4.85 MIL/uL (ref 4.40–5.90)
RDW: 15 % — ABNORMAL HIGH (ref 11.5–14.5)
WBC: 6.7 10*3/uL (ref 3.8–10.6)

## 2017-09-07 LAB — URINALYSIS, COMPLETE (UACMP) WITH MICROSCOPIC
Bilirubin Urine: NEGATIVE
Glucose, UA: >=500 mg/dL — AB
Hgb urine dipstick: NEGATIVE
Ketones, ur: NEGATIVE mg/dL
Leukocytes, UA: NEGATIVE
Nitrite: NEGATIVE
Protein, ur: NEGATIVE mg/dL
Specific Gravity, Urine: 1.02 (ref 1.005–1.030)
pH: 5 (ref 5.0–8.0)

## 2017-09-07 LAB — BRAIN NATRIURETIC PEPTIDE: B Natriuretic Peptide: 30 pg/mL (ref 0.0–100.0)

## 2017-09-07 LAB — TROPONIN I: Troponin I: <0.03 ng/mL (ref <0.03)

## 2017-09-07 MED ORDER — ALBUTEROL SULFATE (2.5 MG/3ML) 0.083% IN NEBU: 2.5000 mg | INHALATION_SOLUTION | RESPIRATORY_TRACT | 0 refills | Status: DC | PRN

## 2017-09-07 MED ORDER — METHYLPREDNISOLONE SODIUM SUCC 125 MG IJ SOLR
80.0000 mg | Freq: Once | INTRAMUSCULAR | Status: AC
  Administered 2017-09-07: 80 mg via INTRAVENOUS
  Filled 2017-09-07: qty 2

## 2017-09-07 MED ORDER — IPRATROPIUM-ALBUTEROL 0.5-2.5 (3) MG/3ML IN SOLN
3.0000 mL | Freq: Once | RESPIRATORY_TRACT | Status: AC
  Administered 2017-09-07: 3 mL via RESPIRATORY_TRACT
  Filled 2017-09-07: qty 3

## 2017-09-07 MED ORDER — PREDNISONE 20 MG PO TABS: ORAL_TABLET | ORAL | 0 refills | Status: DC

## 2017-09-07 NOTE — ED Triage Notes (Signed)
Pt arrived via [facility] from home reporting SOB at rest since Tuesday with no associated chest pain. Pt has h/o COPD.

## 2017-09-07 NOTE — Discharge Instructions (Addendum)
1.  Take steroid as prescribed (Prednisone 40 mg daily x 4 days).  Start your next dose Sunday morning. 2.  You may use albuterol nebulizer every 4 hours as needed for cough/wheezing/difficulty breathing. 3.  Wear compression stockings during the day as needed for swelling. 4.  Return to the ER for worsening symptoms, persistent vomiting, difficulty breathing or other concerns.
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
## 2017-09-07 NOTE — ED Notes (Signed)
Report from [place], [place].

## 2017-10-02 ENCOUNTER — Other Ambulatory Visit: Payer: Self-pay | Admitting: Gastroenterology

## 2017-10-02 DIAGNOSIS — K862 Cyst of pancreas: Secondary | ICD-10-CM

## 2017-10-25 ENCOUNTER — Other Ambulatory Visit: Payer: Self-pay

## 2017-10-25 ENCOUNTER — Ambulatory Visit (HOSPITAL_COMMUNITY)
Admission: RE | Admit: 2017-10-25 | Discharge: 2017-10-25 | Disposition: A | Discharge status: Home / Self Care | Payer: Medicare HMO | Source: Ambulatory Visit | Attending: Gastroenterology | Admitting: Gastroenterology

## 2017-10-25 ENCOUNTER — Ambulatory Visit (HOSPITAL_COMMUNITY): Payer: Medicare HMO | Admitting: Anesthesiology

## 2017-10-25 ENCOUNTER — Encounter (HOSPITAL_COMMUNITY): Payer: Self-pay

## 2017-10-25 ENCOUNTER — Encounter (HOSPITAL_COMMUNITY)
Admission: RE | Disposition: A | Discharge status: Home / Self Care | Payer: Self-pay | Source: Ambulatory Visit | Attending: Gastroenterology

## 2017-10-25 DIAGNOSIS — D124 Benign neoplasm of descending colon: Secondary | ICD-10-CM | POA: Diagnosis not present

## 2017-10-25 DIAGNOSIS — I471 Supraventricular tachycardia: Secondary | ICD-10-CM | POA: Insufficient documentation

## 2017-10-25 DIAGNOSIS — K219 Gastro-esophageal reflux disease without esophagitis: Secondary | ICD-10-CM | POA: Diagnosis not present

## 2017-10-25 DIAGNOSIS — I110 Hypertensive heart disease with heart failure: Secondary | ICD-10-CM | POA: Diagnosis not present

## 2017-10-25 DIAGNOSIS — Z800 Family history of malignant neoplasm of digestive organs: Secondary | ICD-10-CM | POA: Diagnosis not present

## 2017-10-25 DIAGNOSIS — Z96652 Presence of left artificial knee joint: Secondary | ICD-10-CM | POA: Insufficient documentation

## 2017-10-25 DIAGNOSIS — Z87891 Personal history of nicotine dependence: Secondary | ICD-10-CM | POA: Insufficient documentation

## 2017-10-25 DIAGNOSIS — I5032 Chronic diastolic (congestive) heart failure: Secondary | ICD-10-CM | POA: Diagnosis not present

## 2017-10-25 DIAGNOSIS — I252 Old myocardial infarction: Secondary | ICD-10-CM | POA: Insufficient documentation

## 2017-10-25 DIAGNOSIS — Z7982 Long term (current) use of aspirin: Secondary | ICD-10-CM | POA: Insufficient documentation

## 2017-10-25 DIAGNOSIS — I2510 Atherosclerotic heart disease of native coronary artery without angina pectoris: Secondary | ICD-10-CM | POA: Insufficient documentation

## 2017-10-25 DIAGNOSIS — Z955 Presence of coronary angioplasty implant and graft: Secondary | ICD-10-CM | POA: Insufficient documentation

## 2017-10-25 DIAGNOSIS — D123 Benign neoplasm of transverse colon: Secondary | ICD-10-CM | POA: Insufficient documentation

## 2017-10-25 DIAGNOSIS — Z794 Long term (current) use of insulin: Secondary | ICD-10-CM | POA: Insufficient documentation

## 2017-10-25 DIAGNOSIS — Z86010 Personal history of colonic polyps: Secondary | ICD-10-CM | POA: Diagnosis present

## 2017-10-25 DIAGNOSIS — E785 Hyperlipidemia, unspecified: Secondary | ICD-10-CM | POA: Diagnosis not present

## 2017-10-25 DIAGNOSIS — E119 Type 2 diabetes mellitus without complications: Secondary | ICD-10-CM | POA: Diagnosis not present

## 2017-10-25 DIAGNOSIS — Z8673 Personal history of transient ischemic attack (TIA), and cerebral infarction without residual deficits: Secondary | ICD-10-CM | POA: Diagnosis not present

## 2017-10-25 DIAGNOSIS — K5730 Diverticulosis of large intestine without perforation or abscess without bleeding: Secondary | ICD-10-CM | POA: Diagnosis not present

## 2017-10-25 HISTORY — PX: COLONOSCOPY WITH PROPOFOL: SHX5780

## 2017-10-25 HISTORY — PX: POLYPECTOMY: SHX5525

## 2017-10-25 LAB — GLUCOSE, CAPILLARY: Glucose-Capillary: 129 mg/dL — ABNORMAL HIGH (ref 65–99)

## 2017-10-25 SURGERY — COLONOSCOPY WITH PROPOFOL
Anesthesia: Monitor Anesthesia Care

## 2017-10-25 MED ORDER — PROPOFOL 500 MG/50ML IV EMUL
INTRAVENOUS | Status: DC | PRN
  Administered 2017-10-25: 100 ug/kg/min via INTRAVENOUS

## 2017-10-25 MED ORDER — PROPOFOL 10 MG/ML IV BOLUS
INTRAVENOUS | Status: DC | PRN
  Administered 2017-10-25 (×2): 20 mg via INTRAVENOUS

## 2017-10-25 MED ORDER — LACTATED RINGERS IV SOLN
INTRAVENOUS | Status: DC
  Administered 2017-10-25: 11:00:00 via INTRAVENOUS

## 2017-10-25 MED ORDER — SODIUM CHLORIDE 0.9 % IV SOLN: INTRAVENOUS | Status: DC

## 2017-10-25 MED ORDER — PROPOFOL 10 MG/ML IV BOLUS
INTRAVENOUS | Status: AC
  Filled 2017-10-25: qty 20

## 2017-10-25 SURGICAL SUPPLY — 21 items
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
## 2017-10-25 NOTE — Anesthesia Postprocedure Evaluation (Signed)
Anesthesia Post Note  Patient: James Maxwell  Procedure(s) Performed: COLONOSCOPY WITH PROPOFOL (N/A ) POLYPECTOMY     Patient location during evaluation: PACU Anesthesia Type: MAC Level of consciousness: awake and alert Pain management: pain level controlled Vital Signs Assessment: post-procedure vital signs reviewed and stable Respiratory status: spontaneous breathing, nonlabored ventilation, respiratory function stable and patient connected to nasal cannula oxygen Cardiovascular status: stable and blood pressure returned to baseline Postop Assessment: no apparent nausea or vomiting Anesthetic complications: no    Last Vitals:  Vitals:   10/25/17 1210 10/25/17 1220  BP: (!) 161/84 (!) 171/94  Pulse: 77 71  Resp: 19 18  Temp:    SpO2: 100% 98%    Last Pain:  Vitals:   10/25/17 1202  TempSrc: Oral  PainSc: 0-No pain                 Effie Berkshire

## 2017-10-25 NOTE — Op Note (Signed)
[facility] Patient Name: James Maxwell Procedure Date: 10/25/2017 MRN: [id number] Attending MD: Carol Ada , MD Date of Birth: Feb 03, 1951 CSN: [id number] Age: 67 Admit Type: Inpatient Procedure:                Colonoscopy Indications:              High risk colon cancer surveillance: Personal                            history of colonic polyps Providers:                Carol Ada, MD, Presley Raddle, RN, Alan Mulder, Technician Referring MD:              Medicines:                Propofol per Anesthesia Complications:            No immediate complications. Estimated Blood Loss:     Estimated blood loss: none. Procedure:                Pre-Anesthesia Assessment:                           - Prior to the procedure, a History and Physical                            was performed, and patient medications and                            allergies were reviewed. The patient's tolerance of                            previous anesthesia was also reviewed. The risks                            and benefits of the procedure and the sedation                            options and risks were discussed with the patient.                            All questions were answered, and informed consent                            was obtained. Prior Anticoagulants: The patient has                            taken no previous anticoagulant or antiplatelet                            agents. ASA Grade Assessment: III - A patient with                            severe systemic  disease. After reviewing the risks                            and benefits, the patient was deemed in                            satisfactory condition to undergo the procedure.                           - Sedation was administered by an anesthesia                            professional. Deep sedation was attained.                           After obtaining informed consent, the colonoscope                             was passed under direct vision. Throughout the                            procedure, the patient's blood pressure, pulse, and                            oxygen saturations were monitored continuously. The                            EC-[id number] ([id number]) scope was introduced through                            the anus and advanced to the the cecum, identified                            by appendiceal orifice and ileocecal valve. The                            colonoscopy was performed without difficulty. The                            patient tolerated the procedure well. The quality                            of the bowel preparation was good. The ileocecal                            valve, appendiceal orifice, and rectum were                            photographed. Scope In: 11:37:08 AM Scope Out: 11:57:34 AM Scope Withdrawal Time: 0 hours 16 minutes 47 seconds  Total Procedure Duration: 0 hours 20 minutes 26 seconds  Findings:      Three sessile polyps were found in the descending colon and transverse       colon. The polyps were 2 to 3 mm in size. These polyps  were removed with       a cold snare. Resection and retrieval were complete.      Scattered small and large-mouthed diverticula were found in the sigmoid       colon and ascending colon. Impression:               - Three 2 to 3 mm polyps in the descending colon                            and in the transverse colon, removed with a cold                            snare. Resected and retrieved.                           - Diverticulosis in the sigmoid colon and in the                            ascending colon. Moderate Sedation:      N/A- Per Anesthesia Care Recommendation:           - Patient has a contact number available for                            emergencies. The signs and symptoms of potential                            delayed complications were discussed with the                             patient. Return to normal activities tomorrow.                            Written discharge instructions were provided to the                            patient.                           - Resume previous diet.                           - Continue present medications.                           - Await pathology results.                           - Repeat colonoscopy in 3 - 5 years for                            surveillance. Procedure Code(s):        --- Professional ---                           [phone], Colonoscopy, flexible; with removal of  tumor(s), polyp(s), or other lesion(s) by snare                            technique Diagnosis Code(s):        --- Professional ---                           Z86.010, Personal history of colonic polyps                           D12.4, Benign neoplasm of descending colon                           D12.3, Benign neoplasm of transverse colon (hepatic                            flexure or splenic flexure)                           K57.30, Diverticulosis of large intestine without                            perforation or abscess without bleeding CPT copyright 2017 [employer]. All rights reserved. The codes documented in this report are preliminary and upon coder review may  be revised to meet current compliance requirements. Carol Ada, MD Carol Ada, MD 10/25/2017 12:04:20 PM This report has been signed electronically. Number of Addenda: 0

## 2017-10-25 NOTE — Anesthesia Procedure Notes (Signed)
Procedure Name: MAC Date/Time: 10/25/2017 11:32 AM Performed by: Lollie Sails, CRNA Pre-anesthesia Checklist: Patient identified, Emergency Drugs available, Suction available, Patient being monitored and Timeout performed Oxygen Delivery Method: Simple face mask

## 2017-10-25 NOTE — Anesthesia Preprocedure Evaluation (Addendum)
Anesthesia Evaluation  Patient identified by MRN, date of birth, ID band Patient awake    Reviewed: Allergy & Precautions, NPO status , Patient's Chart, lab work & pertinent test results  Airway Mallampati: I  TM Distance: >3 FB Neck ROM: Full    Dental  (+) Poor Dentition, Missing, Dental Advisory Given,    Pulmonary former smoker,    breath sounds clear to auscultation       Cardiovascular hypertension, Pt. on medications + angina + CAD, + Past MI, + Cardiac Stents and +CHF   Rhythm:Regular Rate:Normal     Neuro/Psych CVA    GI/Hepatic Neg liver ROS, GERD  Medicated,  Endo/Other  diabetes, Type 2, Insulin Dependent, Oral Hypoglycemic Agents  Renal/GU negative Renal ROS     Musculoskeletal  (+) Arthritis ,   Abdominal (+) + obese,   Peds  Hematology   Anesthesia Other Findings   Reproductive/Obstetrics                            Lab Results  Component Value Date   WBC 6.7 09/07/2017   HGB 14.0 09/07/2017   HCT 41.5 09/07/2017   MCV 85.5 09/07/2017   PLT 212 09/07/2017   EKG: normal sinus rhythm, occasional PVC noted, unifocal.   Anesthesia Physical Anesthesia Plan  ASA: III  Anesthesia Plan: MAC   Post-op Pain Management:    Induction: Intravenous  PONV Risk Score and Plan: 0 and Propofol infusion  Airway Management Planned: Simple Face Mask  Additional Equipment: None  Intra-op Plan:   Post-operative Plan:   Informed Consent: I have reviewed the patients History and Physical, chart, labs and discussed the procedure including the risks, benefits and alternatives for the proposed anesthesia with the patient or authorized representative who has indicated his/her understanding and acceptance.     Plan Discussed with: CRNA  Anesthesia Plan Comments:         Anesthesia Quick Evaluation

## 2017-10-25 NOTE — Transfer of Care (Signed)
Immediate Anesthesia Transfer of Care Note  Patient: James Maxwell  Procedure(s) Performed: COLONOSCOPY WITH PROPOFOL (N/A ) POLYPECTOMY  Patient Location: Endoscopy Unit  Anesthesia Type:MAC  Level of Consciousness: awake, alert , oriented and patient cooperative  Airway & Oxygen Therapy: Patient Spontanous Breathing and Patient connected to face mask oxygen  Post-op Assessment: Report given to RN, Post -op Vital signs reviewed and stable and Patient moving all extremities  Post vital signs: Reviewed and stable  Last Vitals:  Vitals Value Taken Time  BP 146/89 10/25/2017 12:02 PM  Temp    Pulse 81 10/25/2017 12:04 PM  Resp 22 10/25/2017 12:04 PM  SpO2 99 % 10/25/2017 12:04 PM  Vitals shown include unvalidated device data.  Last Pain:  Vitals:   10/25/17 1050  TempSrc: Oral  PainSc: 0-No pain         Complications: No apparent anesthesia complications

## 2017-10-25 NOTE — Discharge Instructions (Signed)

## 2017-11-01 ENCOUNTER — Ambulatory Visit
Admission: RE | Admit: 2017-11-01 | Discharge: 2017-11-01 | Disposition: A | Discharge status: Home / Self Care | Payer: Medicare HMO | Source: Ambulatory Visit | Attending: Gastroenterology | Admitting: Gastroenterology

## 2017-11-01 DIAGNOSIS — K862 Cyst of pancreas: Secondary | ICD-10-CM

## 2017-11-01 MED ORDER — GADOBENATE DIMEGLUMINE 529 MG/ML IV SOLN
20.0000 mL | Freq: Once | INTRAVENOUS | Status: AC | PRN
  Administered 2017-11-01: 20 mL via INTRAVENOUS

## 2017-11-26 ENCOUNTER — Ambulatory Visit: Payer: Medicare HMO | Admitting: Podiatry

## 2017-12-02 ENCOUNTER — Ambulatory Visit: Payer: Medicare HMO | Admitting: Podiatry

## 2017-12-16 ENCOUNTER — Ambulatory Visit: Payer: Medicare HMO | Admitting: Podiatry

## 2018-01-04 ENCOUNTER — Encounter: Payer: Self-pay | Admitting: Emergency Medicine

## 2018-01-04 ENCOUNTER — Inpatient Hospital Stay
Admission: EM | Admit: 2018-01-04 | Discharge: 2018-01-06 | DRG: 872 | Disposition: A | Discharge status: Home / Self Care | Payer: Medicare HMO | Attending: Internal Medicine | Admitting: Internal Medicine

## 2018-01-04 ENCOUNTER — Other Ambulatory Visit: Payer: Self-pay

## 2018-01-04 ENCOUNTER — Emergency Department: Payer: Medicare HMO

## 2018-01-04 DIAGNOSIS — I2510 Atherosclerotic heart disease of native coronary artery without angina pectoris: Secondary | ICD-10-CM | POA: Diagnosis present

## 2018-01-04 DIAGNOSIS — Z888 Allergy status to other drugs, medicaments and biological substances status: Secondary | ICD-10-CM

## 2018-01-04 DIAGNOSIS — K219 Gastro-esophageal reflux disease without esophagitis: Secondary | ICD-10-CM | POA: Diagnosis present

## 2018-01-04 DIAGNOSIS — I471 Supraventricular tachycardia: Secondary | ICD-10-CM | POA: Diagnosis present

## 2018-01-04 DIAGNOSIS — N4889 Other specified disorders of penis: Secondary | ICD-10-CM | POA: Diagnosis present

## 2018-01-04 DIAGNOSIS — E872 Acidosis: Secondary | ICD-10-CM | POA: Diagnosis present

## 2018-01-04 DIAGNOSIS — Z955 Presence of coronary angioplasty implant and graft: Secondary | ICD-10-CM | POA: Diagnosis not present

## 2018-01-04 DIAGNOSIS — Z7982 Long term (current) use of aspirin: Secondary | ICD-10-CM | POA: Diagnosis not present

## 2018-01-04 DIAGNOSIS — E785 Hyperlipidemia, unspecified: Secondary | ICD-10-CM | POA: Diagnosis present

## 2018-01-04 DIAGNOSIS — N481 Balanitis: Secondary | ICD-10-CM | POA: Diagnosis present

## 2018-01-04 DIAGNOSIS — I5032 Chronic diastolic (congestive) heart failure: Secondary | ICD-10-CM | POA: Diagnosis present

## 2018-01-04 DIAGNOSIS — Z7902 Long term (current) use of antithrombotics/antiplatelets: Secondary | ICD-10-CM

## 2018-01-04 DIAGNOSIS — Z96652 Presence of left artificial knee joint: Secondary | ICD-10-CM | POA: Diagnosis present

## 2018-01-04 DIAGNOSIS — A419 Sepsis, unspecified organism: Secondary | ICD-10-CM | POA: Diagnosis present

## 2018-01-04 DIAGNOSIS — Z8249 Family history of ischemic heart disease and other diseases of the circulatory system: Secondary | ICD-10-CM | POA: Diagnosis not present

## 2018-01-04 DIAGNOSIS — Z794 Long term (current) use of insulin: Secondary | ICD-10-CM

## 2018-01-04 DIAGNOSIS — Z96653 Presence of artificial knee joint, bilateral: Secondary | ICD-10-CM | POA: Diagnosis present

## 2018-01-04 DIAGNOSIS — Z8673 Personal history of transient ischemic attack (TIA), and cerebral infarction without residual deficits: Secondary | ICD-10-CM | POA: Diagnosis not present

## 2018-01-04 DIAGNOSIS — Z833 Family history of diabetes mellitus: Secondary | ICD-10-CM | POA: Diagnosis not present

## 2018-01-04 DIAGNOSIS — I110 Hypertensive heart disease with heart failure: Secondary | ICD-10-CM | POA: Diagnosis present

## 2018-01-04 DIAGNOSIS — I252 Old myocardial infarction: Secondary | ICD-10-CM | POA: Diagnosis not present

## 2018-01-04 DIAGNOSIS — Z87891 Personal history of nicotine dependence: Secondary | ICD-10-CM

## 2018-01-04 DIAGNOSIS — L0390 Cellulitis, unspecified: Secondary | ICD-10-CM | POA: Diagnosis present

## 2018-01-04 DIAGNOSIS — E1165 Type 2 diabetes mellitus with hyperglycemia: Secondary | ICD-10-CM | POA: Diagnosis present

## 2018-01-04 DIAGNOSIS — N485 Ulcer of penis: Secondary | ICD-10-CM

## 2018-01-04 DIAGNOSIS — R0902 Hypoxemia: Secondary | ICD-10-CM

## 2018-01-04 DIAGNOSIS — L03818 Cellulitis of other sites: Secondary | ICD-10-CM

## 2018-01-04 LAB — CBC WITH DIFFERENTIAL/PLATELET
Basophils Absolute: 0 10*3/uL (ref 0–0.1)
Basophils Relative: 0 %
Eosinophils Absolute: 0 10*3/uL (ref 0–0.7)
Eosinophils Relative: 0 %
HEMATOCRIT: 43.9 % (ref 40.0–52.0)
HEMOGLOBIN: 14.9 g/dL (ref 13.0–18.0)
LYMPHS ABS: 0.9 10*3/uL — AB (ref 1.0–3.6)
LYMPHS PCT: 6 %
MCH: 28.9 pg (ref 26.0–34.0)
MCHC: 34.1 g/dL (ref 32.0–36.0)
MCV: 85 fL (ref 80.0–100.0)
Monocytes Absolute: 0.8 10*3/uL (ref 0.2–1.0)
Monocytes Relative: 6 %
NEUTROS PCT: 88 %
Neutro Abs: 12.6 10*3/uL — ABNORMAL HIGH (ref 1.4–6.5)
Platelets: 197 10*3/uL (ref 150–440)
RBC: 5.16 MIL/uL (ref 4.40–5.90)
RDW: 14 % (ref 11.5–14.5)
WBC: 14.3 10*3/uL — AB (ref 3.8–10.6)

## 2018-01-04 LAB — GLUCOSE, CAPILLARY
Glucose-Capillary: 254 mg/dL — ABNORMAL HIGH (ref 70–99)
Glucose-Capillary: 196 mg/dL — ABNORMAL HIGH (ref 70–99)

## 2018-01-04 LAB — BASIC METABOLIC PANEL
Anion gap: 13 (ref 5–15)
BUN: 14 mg/dL (ref 8–23)
CHLORIDE: 100 mmol/L (ref 98–111)
CO2: 21 mmol/L — AB (ref 22–32)
Calcium: 8.8 mg/dL — ABNORMAL LOW (ref 8.9–10.3)
Creatinine, Ser: 1.14 mg/dL (ref 0.61–1.24)
GFR calc non Af Amer: >60 mL/min (ref >60)
Glucose, Bld: 355 mg/dL — ABNORMAL HIGH (ref 70–99)
POTASSIUM: 4.1 mmol/L (ref 3.5–5.1)
SODIUM: 134 mmol/L — AB (ref 135–145)

## 2018-01-04 LAB — URINALYSIS, COMPLETE (UACMP) WITH MICROSCOPIC
BACTERIA UA: NONE SEEN
Bilirubin Urine: NEGATIVE
Glucose, UA: >=500 mg/dL — AB
HGB URINE DIPSTICK: NEGATIVE
Ketones, ur: NEGATIVE mg/dL
Nitrite: NEGATIVE
PROTEIN: NEGATIVE mg/dL
SQUAMOUS EPITHELIAL / LPF: NONE SEEN (ref 0–5)
Specific Gravity, Urine: 1.008 (ref 1.005–1.030)
pH: 5 (ref 5.0–8.0)

## 2018-01-04 LAB — LACTIC ACID, PLASMA
LACTIC ACID, VENOUS: 4.1 mmol/L — AB (ref 0.5–1.9)
Lactic Acid, Venous: 2.7 mmol/L (ref 0.5–1.9)

## 2018-01-04 LAB — TSH: TSH: 0.561 u[IU]/mL (ref 0.350–4.500)

## 2018-01-04 MED ORDER — INSULIN ASPART 100 UNIT/ML ~~LOC~~ SOLN
0.0000 [IU] | Freq: Three times a day (TID) | SUBCUTANEOUS | Status: DC
  Administered 2018-01-04: 11 [IU] via SUBCUTANEOUS
  Administered 2018-01-05: 08:00:00 4 [IU] via SUBCUTANEOUS
  Administered 2018-01-05 (×2): 11 [IU] via SUBCUTANEOUS
  Administered 2018-01-06: 7 [IU] via SUBCUTANEOUS
  Filled 2018-01-04 (×5): qty 1

## 2018-01-04 MED ORDER — NITROGLYCERIN 0.4 MG SL SUBL: 0.4000 mg | SUBLINGUAL_TABLET | SUBLINGUAL | Status: DC | PRN

## 2018-01-04 MED ORDER — FUROSEMIDE 20 MG PO TABS
20.0000 mg | ORAL_TABLET | Freq: Every day | ORAL | Status: DC
  Administered 2018-01-05 – 2018-01-06 (×2): 20 mg via ORAL
  Filled 2018-01-04 (×2): qty 1

## 2018-01-04 MED ORDER — SODIUM CHLORIDE 0.9 % IV SOLN
1.0000 g | INTRAVENOUS | Status: DC
  Administered 2018-01-04: 1 g via INTRAVENOUS
  Filled 2018-01-04 (×2): qty 10

## 2018-01-04 MED ORDER — LOSARTAN POTASSIUM 50 MG PO TABS
100.0000 mg | ORAL_TABLET | Freq: Every day | ORAL | Status: DC
  Administered 2018-01-05 – 2018-01-06 (×2): 100 mg via ORAL
  Filled 2018-01-04 (×2): qty 2

## 2018-01-04 MED ORDER — ALBUTEROL SULFATE (2.5 MG/3ML) 0.083% IN NEBU: 2.5000 mg | INHALATION_SOLUTION | RESPIRATORY_TRACT | Status: DC | PRN

## 2018-01-04 MED ORDER — SODIUM CHLORIDE 0.9 % IV BOLUS
1000.0000 mL | Freq: Once | INTRAVENOUS | Status: AC
Start: 2018-01-04 — End: 2018-01-04
  Administered 2018-01-04: 1000 mL via INTRAVENOUS

## 2018-01-04 MED ORDER — ACYCLOVIR 200 MG PO CAPS
400.0000 mg | ORAL_CAPSULE | Freq: Three times a day (TID) | ORAL | Status: DC
  Administered 2018-01-04 – 2018-01-06 (×5): 400 mg via ORAL
  Filled 2018-01-04 (×7): qty 2

## 2018-01-04 MED ORDER — CLOTRIMAZOLE 1 % EX CREA
TOPICAL_CREAM | Freq: Two times a day (BID) | CUTANEOUS | Status: DC
  Administered 2018-01-04: 18:00:00 via TOPICAL
  Filled 2018-01-04: qty 15

## 2018-01-04 MED ORDER — AMLODIPINE BESYLATE 10 MG PO TABS
10.0000 mg | ORAL_TABLET | Freq: Every day | ORAL | Status: DC
  Administered 2018-01-05: 08:00:00 10 mg via ORAL
  Filled 2018-01-04: qty 1

## 2018-01-04 MED ORDER — ACETAMINOPHEN 650 MG RE SUPP: 650.0000 mg | Freq: Four times a day (QID) | RECTAL | Status: DC | PRN

## 2018-01-04 MED ORDER — AMIODARONE HCL 200 MG PO TABS
200.0000 mg | ORAL_TABLET | Freq: Every day | ORAL | Status: DC
  Administered 2018-01-05: 08:00:00 200 mg via ORAL
  Filled 2018-01-04: qty 1

## 2018-01-04 MED ORDER — ROSUVASTATIN CALCIUM 10 MG PO TABS
10.0000 mg | ORAL_TABLET | Freq: Every evening | ORAL | Status: DC
  Administered 2018-01-04 – 2018-01-05 (×2): 10 mg via ORAL
  Filled 2018-01-04 (×2): qty 1

## 2018-01-04 MED ORDER — ENOXAPARIN SODIUM 40 MG/0.4ML ~~LOC~~ SOLN
40.0000 mg | SUBCUTANEOUS | Status: DC
  Administered 2018-01-04 – 2018-01-05 (×2): 40 mg via SUBCUTANEOUS
  Filled 2018-01-04 (×2): qty 0.4

## 2018-01-04 MED ORDER — INSULIN REGULAR HUMAN 100 UNIT/ML IJ SOLN
5.0000 [IU] | INTRAMUSCULAR | Status: AC
  Administered 2018-01-04: 5 [IU] via INTRAVENOUS
  Filled 2018-01-04: qty 0.05

## 2018-01-04 MED ORDER — HYDRALAZINE HCL 25 MG PO TABS
25.0000 mg | ORAL_TABLET | Freq: Two times a day (BID) | ORAL | Status: DC
  Administered 2018-01-04 – 2018-01-06 (×4): 25 mg via ORAL
  Filled 2018-01-04 (×4): qty 1

## 2018-01-04 MED ORDER — PANTOPRAZOLE SODIUM 40 MG PO TBEC
40.0000 mg | DELAYED_RELEASE_TABLET | Freq: Every day | ORAL | Status: DC
  Administered 2018-01-05 – 2018-01-06 (×2): 40 mg via ORAL
  Filled 2018-01-04 (×2): qty 1

## 2018-01-04 MED ORDER — VANCOMYCIN HCL IN DEXTROSE 1-5 GM/200ML-% IV SOLN
1000.0000 mg | Freq: Once | INTRAVENOUS | Status: AC
  Administered 2018-01-04: 1000 mg via INTRAVENOUS
  Filled 2018-01-04: qty 200

## 2018-01-04 MED ORDER — CARVEDILOL 3.125 MG PO TABS
3.1250 mg | ORAL_TABLET | Freq: Two times a day (BID) | ORAL | Status: DC
  Administered 2018-01-04 – 2018-01-06 (×4): 3.125 mg via ORAL
  Filled 2018-01-04 (×4): qty 1

## 2018-01-04 MED ORDER — ASPIRIN EC 81 MG PO TBEC
81.0000 mg | DELAYED_RELEASE_TABLET | Freq: Every day | ORAL | Status: DC
  Administered 2018-01-05 – 2018-01-06 (×2): 81 mg via ORAL
  Filled 2018-01-04 (×2): qty 1

## 2018-01-04 MED ORDER — ISOSORBIDE MONONITRATE ER 30 MG PO TB24
30.0000 mg | ORAL_TABLET | Freq: Every day | ORAL | Status: DC
  Administered 2018-01-05 – 2018-01-06 (×2): 30 mg via ORAL
  Filled 2018-01-04 (×2): qty 1

## 2018-01-04 MED ORDER — VITAMIN D3 25 MCG (1000 UNIT) PO TABS
2000.0000 [IU] | ORAL_TABLET | Freq: Every day | ORAL | Status: DC
  Administered 2018-01-05 – 2018-01-06 (×2): 2000 [IU] via ORAL
  Filled 2018-01-04 (×3): qty 2

## 2018-01-04 MED ORDER — INSULIN GLARGINE 100 UNIT/ML ~~LOC~~ SOLN
28.0000 [IU] | Freq: Every day | SUBCUTANEOUS | Status: DC
  Administered 2018-01-04 – 2018-01-05 (×2): 28 [IU] via SUBCUTANEOUS
  Filled 2018-01-04 (×3): qty 0.28

## 2018-01-04 MED ORDER — DOCUSATE SODIUM 100 MG PO CAPS
100.0000 mg | ORAL_CAPSULE | Freq: Two times a day (BID) | ORAL | Status: DC
  Administered 2018-01-04 – 2018-01-06 (×4): 100 mg via ORAL
  Filled 2018-01-04 (×4): qty 1

## 2018-01-04 MED ORDER — CLOPIDOGREL BISULFATE 75 MG PO TABS
75.0000 mg | ORAL_TABLET | Freq: Every day | ORAL | Status: DC
  Administered 2018-01-05 – 2018-01-06 (×2): 75 mg via ORAL
  Filled 2018-01-04 (×2): qty 1

## 2018-01-04 MED ORDER — LORATADINE 10 MG PO TABS
10.0000 mg | ORAL_TABLET | Freq: Every day | ORAL | Status: DC
  Administered 2018-01-05 – 2018-01-06 (×2): 10 mg via ORAL
  Filled 2018-01-04 (×2): qty 1

## 2018-01-04 MED ORDER — ONDANSETRON HCL 4 MG PO TABS: 4.0000 mg | ORAL_TABLET | Freq: Four times a day (QID) | ORAL | Status: DC | PRN

## 2018-01-04 MED ORDER — ACETAMINOPHEN 325 MG PO TABS
650.0000 mg | ORAL_TABLET | Freq: Four times a day (QID) | ORAL | Status: DC | PRN
  Administered 2018-01-04: 650 mg via ORAL
  Filled 2018-01-04: qty 2

## 2018-01-04 MED ORDER — INSULIN ASPART 100 UNIT/ML ~~LOC~~ SOLN
10.0000 [IU] | SUBCUTANEOUS | Status: AC
  Administered 2018-01-04: 10 [IU] via SUBCUTANEOUS
  Filled 2018-01-04: qty 1

## 2018-01-04 MED ORDER — ACETAMINOPHEN 325 MG PO TABS
650.0000 mg | ORAL_TABLET | Freq: Once | ORAL | Status: AC
  Administered 2018-01-04: 650 mg via ORAL
  Filled 2018-01-04: qty 2

## 2018-01-04 MED ORDER — ONDANSETRON HCL 4 MG/2ML IJ SOLN
4.0000 mg | Freq: Four times a day (QID) | INTRAMUSCULAR | Status: DC | PRN
Start: 2018-01-04 — End: 2018-01-06

## 2018-01-04 MED ORDER — SODIUM CHLORIDE 0.9 % IV SOLN
INTRAVENOUS | Status: DC
  Administered 2018-01-04 – 2018-01-05 (×5): via INTRAVENOUS

## 2018-01-04 MED ORDER — INSULIN ASPART 100 UNIT/ML ~~LOC~~ SOLN: 4.0000 [IU] | Freq: Three times a day (TID) | SUBCUTANEOUS | Status: DC
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
## 2018-01-04 NOTE — ED Notes (Signed)
Date and time results received: 01/04/18 1312  Test: Lactic Acid Critical Value: 4.1  Name of Provider Notified: Siadecki MD  Orders Received. Fluids & Antibiotics. See MAR.

## 2018-01-04 NOTE — Progress Notes (Signed)
   01/04/18 1609  Clinical Encounter Type  Visited With Patient and family together  Visit Type Initial  Referral From Physician  Consult/Referral To Chaplain  Spiritual Encounters  Spiritual Needs Prayer;Emotional  Stress Factors  Patient Stress Factors Health changes  Family Stress Factors Health changes  Advance Directives (For Healthcare)  Does Patient Have a Medical Advance Directive? No  Would patient like information on creating a medical advance directive? Yes (Inpatient - patient requests chaplain consult to create a medical advance directive)  [place] Directives  Does Patient Have a Mental Health Advance Directive? No  Would patient like information on creating a mental health advance directive? No - Patient declined   Received OR for (AD), presented to the room, identified myself as ([place]) and was welcomed into the room. Pastoral presence ensued. I acknowledged both the patient and the endorsed wife who was present. I verified their wishes to receive (AD) education/(AD) education ensued. Conversation continued by acknowledging faith, church, relationships and health challenges, offer for prayer received, prayer commenced, and prior to leaving salutations of God's peace, grace, mercy and healing be bestowed upon the patient.

## 2018-01-04 NOTE — ED Triage Notes (Signed)
Pt presents to ED via POV with c/o fever and chills that started at approx 0630 and R groin pain that started this morning as well. Pt noticeably shivering during triage. Pt states wound to penis at this time that he has had for approx 1 month.
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
## 2018-01-05 ENCOUNTER — Inpatient Hospital Stay: Payer: Medicare HMO

## 2018-01-05 ENCOUNTER — Encounter: Payer: Self-pay | Admitting: Licensed Clinical Social Worker

## 2018-01-05 LAB — RAPID HIV SCREEN (HIV 1/2 AB+AG)
HIV 1/2 Antibodies: NONREACTIVE
HIV-1 P24 ANTIGEN - HIV24: NONREACTIVE

## 2018-01-05 LAB — GLUCOSE, CAPILLARY
Glucose-Capillary: 191 mg/dL — ABNORMAL HIGH (ref 70–99)
Glucose-Capillary: 272 mg/dL — ABNORMAL HIGH (ref 70–99)
Glucose-Capillary: 257 mg/dL — ABNORMAL HIGH (ref 70–99)
Glucose-Capillary: 304 mg/dL — ABNORMAL HIGH (ref 70–99)

## 2018-01-05 LAB — LACTIC ACID, PLASMA: Lactic Acid, Venous: 1.4 mmol/L (ref 0.5–1.9)

## 2018-01-05 LAB — TROPONIN I
Troponin I: <0.03 ng/mL (ref <0.03)
Troponin I: <0.03 ng/mL (ref <0.03)

## 2018-01-05 MED ORDER — DIGOXIN 0.25 MG/ML IJ SOLN
0.2500 mg | Freq: Four times a day (QID) | INTRAMUSCULAR | Status: AC
  Administered 2018-01-05 (×2): 0.25 mg via INTRAVENOUS
  Filled 2018-01-05 (×2): qty 2

## 2018-01-05 MED ORDER — METOPROLOL TARTRATE 5 MG/5ML IV SOLN: 5.0000 mg | Freq: Four times a day (QID) | INTRAVENOUS | Status: DC | PRN

## 2018-01-05 MED ORDER — DILTIAZEM HCL 30 MG PO TABS
60.0000 mg | ORAL_TABLET | Freq: Three times a day (TID) | ORAL | Status: DC
  Administered 2018-01-05 – 2018-01-06 (×3): 60 mg via ORAL
  Filled 2018-01-05 (×3): qty 2

## 2018-01-05 MED ORDER — DOXYCYCLINE HYCLATE 100 MG PO TABS
100.0000 mg | ORAL_TABLET | Freq: Two times a day (BID) | ORAL | Status: DC
  Administered 2018-01-05 – 2018-01-06 (×3): 100 mg via ORAL
  Filled 2018-01-05 (×4): qty 1

## 2018-01-05 MED ORDER — METOPROLOL TARTRATE 5 MG/5ML IV SOLN
5.0000 mg | Freq: Once | INTRAVENOUS | Status: AC
  Administered 2018-01-05: 5 mg via INTRAVENOUS
  Filled 2018-01-05: qty 5

## 2018-01-05 MED ORDER — AMIODARONE HCL 200 MG PO TABS
400.0000 mg | ORAL_TABLET | Freq: Two times a day (BID) | ORAL | Status: DC
  Administered 2018-01-05 – 2018-01-06 (×2): 400 mg via ORAL
  Filled 2018-01-05 (×3): qty 2

## 2018-01-05 MED ORDER — ALUM & MAG HYDROXIDE-SIMETH 200-200-20 MG/5ML PO SUSP
30.0000 mL | ORAL | Status: DC | PRN
  Administered 2018-01-05 (×3): 30 mL via ORAL
  Filled 2018-01-05 (×3): qty 30

## 2018-01-05 NOTE — Progress Notes (Signed)
Report called to Richardson Landry RN on telemetry with pt accepted. Will transport pt asap. HR beginng to increase. Also reported that pt has 2nd dose of maalox today for indigestion.

## 2018-01-05 NOTE — Progress Notes (Signed)
Penile swelling and infction is getting better, but he have SVT. Transferred to Telemetry. BP stable to lower side. No chest pain. Given metoprolol injections and follow serial troponin+ cardio consult.

## 2018-01-05 NOTE — Progress Notes (Signed)
Patient not placed on cpap at this time. He is c/o chest discomfort. Was transferred to this floor earlier today for cardiac event. Patient was placed on o2 tank for transport and was never hooked up to wall. Im not sure how long he has been on room air but I found him on empty o2 tank. O2 sats were 96-97 HR 63 rr 20. Informed RN if patient wants to go on to let me know. Placed him on o2 at 2 liters due to chest discomfort.

## 2018-01-05 NOTE — Progress Notes (Signed)
Cardizem given po. Wife in and updated. Continuing to monitor vs's with lab/xray done. HR 130's-140.

## 2018-01-05 NOTE — Progress Notes (Signed)
Troponin 0.03 with result paged to Dr. Anselm Jungling. Staffed with charge nurse, Jinny Blossom on bed status- awaiting bed assignment.

## 2018-01-05 NOTE — Progress Notes (Addendum)
NT notified RN with routine VS check of rapid HR; confirmed with stethoscope of rapid regular rhtymn. Pt reports he has had history of rapid heart rate requiring ED visits with "medicine that stops my heart". Denies chest pain/SOB; reports onset approximately 30 minutes of feeling heart was beating fast but did not communicate to anytone. Alert, oriented. Skin warm slight moist. Paged Dr. Anselm Jungling with telemetry applied with SVT with HR 156 reported. Dr. Anselm Jungling called pt and then called RN with metoprolol 5 mg IV given with BP trending in low norms with HR decreased to 130's. 02@2l /Alexander applied for support. SEE VS"s. Communicated with telemetry of start/stop metoprolol and report of heart rhtymn. Tele clerk unable to interpret strip-?flutter with rapid HR; can't see p waves. RTC to Dr. Marthann Schiller with orders to transfer pt to telemetry unit.

## 2018-01-05 NOTE — Clinical Social Work Note (Signed)
Clinical Social Work Assessment  Patient Details  Name: James Maxwell MRN: [id number] Date of Birth: 1950/12/30  Date of referral:  01/05/18               Reason for consult:  Transportation, [place] Psychosocial, [employer]                Permission sought to share information with:    Permission granted to share information::     Name::        Agency::     Relationship::     Contact Information:     Housing/Transportation Living arrangements for the past 2 months:  Single Family Home Source of Information:  Patient Patient Interpreter Needed:  None Criminal Activity/Legal Involvement Pertinent to Current Situation/Hospitalization:  No - Comment as needed Significant Relationships:  Spouse Lives with:  Spouse Do you feel safe going back to the place where you live?  Yes Need for family participation in patient care:  No (Coment)  Care giving concerns: Consult for food and transportation needs   Facilities manager / plan:  The CSW met with the patient at bedside to discuss report of food insecurity and transportation needs. The patient denies food insecurity: "I have plenty to eat, but I have trouble getting out during the day since everybody is at work." The patient has struggled with attending medical appointments due to transportation barriers. The CSW provided the patient with a printed copy of how to contact [facility], and the CSW provided teach back about the information. The patient plans to use [facility] to access the community for shopping and medical needs.  The CSW is signing off. Please consult should needs arise.  Employment status:  Retired Nurse, adult PT Recommendations:  Not assessed at this time Information / Referral to community resources:  Other (Comment Required)([facility])  Patient/Family's Response to care:  The patient thanked the CSW.  Patient/Family's Understanding of and Emotional Response to Diagnosis, Current Treatment,  and Prognosis:  The patient seems to understand how to contact and use [facility] for community access as evident by his responses during teach back.  Emotional Assessment Appearance:  Appears stated age Attitude/Demeanor/Rapport:  Lethargic, Gracious Affect (typically observed):  Appropriate, Pleasant Orientation:  Oriented to Self, Oriented to Place, Oriented to  Time, Oriented to Situation Alcohol / Substance use:  Never Used Psych involvement (Current and /or in the community):  No (Comment)  Discharge Needs  Concerns to be addressed:  Other (Comment Required(Transportation concerns) Readmission within the last 30 days:  No Current discharge risk:  Chronically ill Barriers to Discharge:  Continued Medical Work up   [employer], LCSW 01/05/2018, 11:32 AM

## 2018-01-06 ENCOUNTER — Telehealth: Payer: Self-pay | Admitting: Urology

## 2018-01-06 ENCOUNTER — Inpatient Hospital Stay
Admit: 2018-01-06 | Discharge: 2018-01-06 | Disposition: A | Discharge status: Home / Self Care | Payer: Medicare HMO | Attending: Internal Medicine | Admitting: Internal Medicine

## 2018-01-06 LAB — ECHOCARDIOGRAM COMPLETE
Height: 69 in
Weight: 4364.8 oz

## 2018-01-06 LAB — CBC
HEMATOCRIT: 37.2 % — AB (ref 40.0–52.0)
HEMOGLOBIN: 12.7 g/dL — AB (ref 13.0–18.0)
MCH: 29.4 pg (ref 26.0–34.0)
MCHC: 34.2 g/dL (ref 32.0–36.0)
MCV: 86 fL (ref 80.0–100.0)
PLATELETS: 164 10*3/uL (ref 150–440)
RBC: 4.32 MIL/uL — AB (ref 4.40–5.90)
RDW: 14.7 % — ABNORMAL HIGH (ref 11.5–14.5)
WBC: 7.1 10*3/uL (ref 3.8–10.6)

## 2018-01-06 LAB — BASIC METABOLIC PANEL
ANION GAP: 7 (ref 5–15)
BUN: 11 mg/dL (ref 8–23)
CHLORIDE: 108 mmol/L (ref 98–111)
CO2: 25 mmol/L (ref 22–32)
Calcium: 7.7 mg/dL — ABNORMAL LOW (ref 8.9–10.3)
Creatinine, Ser: 0.98 mg/dL (ref 0.61–1.24)
GFR calc Af Amer: >60 mL/min (ref >60)
GLUCOSE: 261 mg/dL — AB (ref 70–99)
POTASSIUM: 3.8 mmol/L (ref 3.5–5.1)
Sodium: 140 mmol/L (ref 135–145)

## 2018-01-06 LAB — GLUCOSE, CAPILLARY: Glucose-Capillary: 218 mg/dL — ABNORMAL HIGH (ref 70–99)

## 2018-01-06 MED ORDER — ACYCLOVIR 200 MG PO CAPS: 400.0000 mg | ORAL_CAPSULE | Freq: Three times a day (TID) | ORAL | 0 refills | Status: AC

## 2018-01-06 MED ORDER — AMIODARONE HCL 400 MG PO TABS: 400.0000 mg | ORAL_TABLET | Freq: Two times a day (BID) | ORAL | 0 refills | Status: DC

## 2018-01-06 MED ORDER — DOXYCYCLINE HYCLATE 100 MG PO TABS: 100.0000 mg | ORAL_TABLET | Freq: Two times a day (BID) | ORAL | 0 refills | Status: AC

## 2018-01-06 MED ORDER — DILTIAZEM HCL ER COATED BEADS 120 MG PO CP24: 120.0000 mg | ORAL_CAPSULE | Freq: Every day | ORAL | 0 refills | Status: DC

## 2018-01-06 NOTE — Consult Note (Signed)
James Maxwell is a 67 y.o. male  [id number]  Primary Cardiologist: Takeyla Million Reason for Consultation: SVT  HPI: 39  Came with chils and fevere and found to have sepsis. He had HR 130, but no EKG was done, and was given metoprolol and digoxin and now in NSR.Feels fine now.   Review of Systems: No chest pain or palpitation.   Past Medical History:  Diagnosis Date  . Arthritis   . Chronic diastolic CHF (congestive heart failure) ([place])    a. 04/2014 Echo: EF 55%, Gr1 DD.  [name] Coronary artery disease    a. 2006 s/p PCI RCA;  b. 2012 MV: no ischemia.  . Diabetes mellitus without complication ([place])   . GERD (gastroesophageal reflux disease)   . History of gout   . Hyperlipidemia   . Hypertension   . MI, old 21  . Stroke ([place])   . SVT (supraventricular tachycardia) (H[facility])     Medications Prior to Admission  Medication Sig Dispense Refill  . albuterol (PROVENTIL HFA;VENTOLIN HFA) 108 (90 Base) MCG/ACT inhaler Inhale 1-2 puffs into the lungs every 4 (four) hours as needed for wheezing or shortness of breath. (Patient taking differently: Inhale 2 puffs into the lungs every 4 (four) hours as needed for wheezing or shortness of breath. ) 1 Inhaler 0  . amiodarone (PACERONE) 200 MG tablet Take 200 mg by mouth daily.    [name] amLODipine (NORVASC) 10 MG tablet Take 1 tablet (10 mg total) by mouth daily. 30 tablet 0  . aspirin EC 81 MG tablet Take 81 mg by mouth daily.    . carvedilol (COREG) 3.125 MG tablet Take 3.125 mg by mouth 2 (two) times daily.    . Cholecalciferol (VITAMIN D) 2000 units tablet Take 2,000 Units by mouth daily.     [name] CLARITIN 10 MG tablet Take 10 mg by mouth daily.    . clopidogrel (PLAVIX) 75 MG tablet Take 75 mg by mouth daily.    . furosemide (LASIX) 20 MG tablet Take 20 mg by mouth daily.    [name] glimepiride (AMARYL) 4 MG tablet Take 2 mg by mouth daily with breakfast.     . hydrALAZINE (APRESOLINE) 25 MG tablet Take 25 mg by mouth 2 (two) times daily.    . insulin  aspart (FIASP) 100 UNIT/ML injection Inject 4-20 Units into the skin 3 (three) times daily before meals. 131-180=4 units, 181-240=8 units, 241-300=10 units, 301-350=12 units, 351-400=16 units, and if blood sugar is greater than 400=20 units and call MD    . Insulin Glargine (LANTUS SOLOSTAR) 100 UNIT/ML Solostar Pen Inject 40 Units into the skin at bedtime.     . isosorbide mononitrate (IMDUR) 30 MG 24 hr tablet Take 30 mg by mouth daily.    [name] losartan (COZAAR) 100 MG tablet Take 100 mg by mouth daily.    . metFORMIN (GLUCOPHAGE) 500 MG tablet Take 500 mg by mouth 2 (two) times daily with a meal.    . nitroGLYCERIN (NITROSTAT) 0.4 MG SL tablet Place 1 tablet (0.4 mg total) under the tongue every 5 (five) minutes as needed for chest pain. 25 tablet 5  . ondansetron (ZOFRAN) 4 MG tablet Take 4 mg by mouth 2 (two) times daily as needed for nausea or vomiting.     . pantoprazole (PROTONIX) 40 MG tablet Take 40 mg by mouth daily.    . rosuvastatin (CRESTOR) 10 MG tablet Take 10 mg by mouth daily.    [name] albuterol (PROVENTIL) (  2.5 MG/3ML) 0.083% nebulizer solution Take 3 mLs (2.5 mg total) by nebulization every 4 (four) hours as needed for wheezing or shortness of breath. (Patient not taking: Reported on 10/17/2017) 75 mL 0  . insulin lispro (HUMALOG) 100 UNIT/ML injection Insulin pen, use as directed by your doctor's sliding scale  Dispense 2 pens refil x 3  Disregard below dispensing instructions generated by  He computer (Patient not taking: Reported on 01/04/2018) 10 mL 3     . acyclovir  400 mg Oral TID  . amiodarone  400 mg Oral BID  . aspirin EC  81 mg Oral Daily  . carvedilol  3.125 mg Oral BID  . cholecalciferol  2,000 Units Oral Daily  . clopidogrel  75 mg Oral Daily  . diltiazem  60 mg Oral Q8H  . docusate sodium  100 mg Oral BID  . doxycycline  100 mg Oral Q12H  . enoxaparin (LOVENOX) injection  40 mg Subcutaneous Q24H  . furosemide  20 mg Oral Daily  . hydrALAZINE  25 mg Oral BID  .  insulin aspart  0-20 Units Subcutaneous TID WC  . insulin glargine  28 Units Subcutaneous QHS  . isosorbide mononitrate  30 mg Oral Daily  . loratadine  10 mg Oral Daily  . losartan  100 mg Oral Daily  . pantoprazole  40 mg Oral Daily  . rosuvastatin  10 mg Oral QPM    Infusions: . sodium chloride 150 mL/hr at 01/05/18 2334    Allergies  Allergen Reactions  . Lipitor [Atorvastatin]     myalgias    Social History   Socioeconomic History  . Marital status: Married    Spouse name: Not on file  . Number of children: Not on file  . Years of education: Not on file  . Highest education level: Not on file  Occupational History  . Not on file  Social Needs  . Financial resource strain: Somewhat hard  . Food insecurity:    Worry: Sometimes true    Inability: Sometimes true  . Transportation needs:    Medical: No    Non-medical: No  Tobacco Use  . Smoking status: Former Smoker    Last attempt to quit: 07/16/1995    Years since quitting: 22.4  . Smokeless tobacco: Never Used  Substance and Sexual Activity  . Alcohol use: No  . Drug use: No  . Sexual activity: Yes  Lifestyle  . Physical activity:    Days per week: 3 days    Minutes per session: 10 min  . Stress: Only a little  Relationships  . Social connections:    Talks on phone: Twice a week    Gets together: Once a week    Attends religious service: More than 4 times per year    Active member of club or organization: No    Attends meetings of clubs or organizations: Never    Relationship status: Married  . Intimate partner violence:    Fear of current or ex partner: No    Emotionally abused: No    Physically abused: No    Forced sexual activity: No  Other Topics Concern  . Not on file  Social History Narrative   Lives in [place] with [place].  Does not routinely exercise.    Family History  Problem Relation Age of Onset  . Diabetes Mother   . Hypertension Mother   . Heart attack Mother   .  Hypertension Sister   . Cancer Brother   .  Heart attack Son   . Cancer Brother   . Heart attack Brother   . Hypertension Sister     PHYSICAL EXAM: Vitals:   01/06/18 0450 01/06/18 0822  BP: 132/67 139/79  Pulse: 66 69  Resp: 17 17  Temp: 97.9 F (36.6 C) 98.7 F (37.1 C)  SpO2: 98% 98%     Intake/Output Summary (Last 24 hours) at 01/06/2018 0846 Last data filed at 01/06/2018 0600 Gross per 24 hour  Intake 2400 ml  Output 3050 ml  Net -650 ml    General:  Well appearing. No respiratory difficulty HEENT: normal Neck: supple. no JVD. Carotids 2+ bilat; no bruits. No lymphadenopathy or thryomegaly appreciated. Cor: PMI nondisplaced. Regular rate & rhythm. No rubs, gallops or murmurs. Lungs: clear Abdomen: soft, nontender, nondistended. No hepatosplenomegaly. No bruits or masses. Good bowel sounds. Extremities: no cyanosis, clubbing, rash, edema Neuro: alert & oriented x 3, cranial nerves grossly intact. moves all 4 extremities w/o difficulty. Affect pleasant.  ECG: NSR no acute changes  Results for orders placed or performed during the hospital encounter of 01/04/18 (from the past 24 hour(s))  Rapid HIV screen (HIV 1/2 Ab+Ag) ([facility] Only)     Status: None   Collection Time: 01/05/18 11:24 AM  Result Value Ref Range   HIV-1 P24 Antigen - HIV24 NON REACTIVE NON REACTIVE   HIV 1/2 Antibodies NON REACTIVE NON REACTIVE   Interpretation (HIV Ag Ab)      A non reactive test result means that HIV 1 or HIV 2 antibodies and HIV 1 p24 antigen were not detected in the specimen.  Glucose, capillary     Status: Abnormal   Collection Time: 01/05/18 11:49 AM  Result Value Ref Range   Glucose-Capillary 272 (H) 70 - 99 mg/dL  Troponin I     Status: None   Collection Time: 01/05/18  3:03 PM  Result Value Ref Range   Troponin I <0.03 <0.03 ng/mL  Glucose, capillary     Status: Abnormal   Collection Time: 01/05/18  4:42 PM  Result Value Ref Range   Glucose-Capillary 257 (H) 70 - 99  mg/dL  Troponin I     Status: None   Collection Time: 01/05/18  5:41 PM  Result Value Ref Range   Troponin I <0.03 <0.03 ng/mL  Lactic acid, plasma     Status: None   Collection Time: 01/05/18  5:41 PM  Result Value Ref Range   Lactic Acid, Venous 1.4 0.5 - 1.9 mmol/L  Glucose, capillary     Status: Abnormal   Collection Time: 01/05/18  9:51 PM  Result Value Ref Range   Glucose-Capillary 304 (H) 70 - 99 mg/dL  Troponin I     Status: None   Collection Time: 01/05/18 10:30 PM  Result Value Ref Range   Troponin I <0.03 <0.03 ng/mL  CBC     Status: Abnormal   Collection Time: 01/06/18  5:37 AM  Result Value Ref Range   WBC 7.1 3.8 - 10.6 K/uL   RBC 4.32 (L) 4.40 - 5.90 MIL/uL   Hemoglobin 12.7 (L) 13.0 - 18.0 g/dL   HCT 37.2 (L) 40.0 - 52.0 %   MCV 86.0 80.0 - 100.0 fL   MCH 29.4 26.0 - 34.0 pg   MCHC 34.2 32.0 - 36.0 g/dL   RDW 14.7 (H) 11.5 - 14.5 %   Platelets 164 150 - 440 K/uL  Basic metabolic panel     Status: Abnormal   Collection Time: 01/06/18  5:37 AM  Result Value Ref Range   Sodium 140 135 - 145 mmol/L   Potassium 3.8 3.5 - 5.1 mmol/L   Chloride 108 98 - 111 mmol/L   CO2 25 22 - 32 mmol/L   Glucose, Bld 261 (H) 70 - 99 mg/dL   BUN 11 8 - 23 mg/dL   Creatinine, Ser 0.98 0.61 - 1.24 mg/dL   Calcium 7.7 (L) 8.9 - 10.3 mg/dL   GFR calc non Af Amer >60 >60 mL/min   GFR calc Af Amer >60 >60 mL/min   Anion gap 7 5 - 15  Glucose, capillary     Status: Abnormal   Collection Time: 01/06/18  8:07 AM  Result Value Ref Range   Glucose-Capillary 218 (H) 70 - 99 mg/dL   Dg Chest 1 View  Result Date: 01/05/2018 CLINICAL DATA:  Hypoxia EXAM: CHEST  1 VIEW COMPARISON:  01/04/2018 and prior radiographs FINDINGS: The cardiomediastinal silhouette is unremarkable. Mild peribronchial thickening noted. There is no evidence of focal airspace disease, pulmonary edema, suspicious pulmonary nodule/mass, pleural effusion, or pneumothorax. No acute bony abnormalities are identified.  IMPRESSION: No acute abnormality. Electronically Signed   By: Margarette Canada M.D.   On: 01/05/2018 18:43   Dg Chest 2 View  Result Date: 01/04/2018 CLINICAL DATA:  Fever this morning with generalized weakness. EXAM: CHEST - 2 VIEW COMPARISON:  09/07/2017 FINDINGS: Lungs are adequately inflated without consolidation or effusion. Cardiomediastinal silhouette and remainder of the exam is unchanged. IMPRESSION: No active cardiopulmonary disease. Electronically Signed   By: Marin Olp M.D.   On: 01/04/2018 12:37     ASSESSMENT AND PLAN: S/P probably SVT, and on po metoprolol and digoxin, HR ok.  Syncere Kaminski A

## 2018-01-06 NOTE — Telephone Encounter (Signed)
App made patient is still admitted  James Maxwell

## 2018-01-06 NOTE — Care Management Note (Signed)
Case Management Note  Patient Details  Name: James Maxwell MRN: [id number] Date of Birth: 10/02/1950  Subjective/Objective: Spoke with patient. He will be discharged home with albuterol nebulizer. Will need a home nebulizer machine. Ordered from [place] with [facility]. He lives with his wife. Uses a cane. Drive rarely but occasionally. Assessed need for home health and I do not think there is a need. Patient instructed to call his primary care for any needs after discharge. PCP is Dr. Humphrey Rolls.                    Action/Plan:   Expected Discharge Date:  01/06/18               Expected Discharge Plan:  Home/Self Care  In-House Referral:     Discharge planning Services  CM Consult  Post Acute Care Choice:  Durable Medical Equipment Choice offered to:     DME Arranged:  Nebulizer machine DME Agency:  [place]:    [facility] Agency:     Status of Service:  Completed, signed off  If discussed at [place] of Stay Meetings, dates discussed:    Additional Comments:  Jolly Mango, RN 01/06/2018, 10:22 AM

## 2018-01-06 NOTE — Progress Notes (Signed)
*  PRELIMINARY RESULTS* Echocardiogram 2D Echocardiogram has been performed.  James Maxwell 01/06/2018, 9:27 AM

## 2018-01-06 NOTE — Progress Notes (Signed)
Pt discharged home today with family, nebulizer from [facility] provided for pt, with usage instructions. Pt VSS, no complaints, IV removed, follow up appointment information and medication regimen information provided.

## 2018-01-06 NOTE — Progress Notes (Signed)
[place] [facility] [place] NAME: James Maxwell    MR#:  [id number]  DATE OF BIRTH:  11/17/1950  SUBJECTIVE:  CHIEF COMPLAINT:   Chief Complaint  Patient presents with  . Fever  . Groin Pain   Came with penile swelling, discharge and pain, getting better,.Have tachycardia with RVR.  REVIEW OF SYSTEMS:  CONSTITUTIONAL: No fever, fatigue or weakness.  EYES: No blurred or double vision.  EARS, NOSE, AND THROAT: No tinnitus or ear pain.  RESPIRATORY: No cough, shortness of breath, wheezing or hemoptysis.  CARDIOVASCULAR: No chest pain, orthopnea, edema.  GASTROINTESTINAL: No nausea, vomiting, diarrhea or abdominal pain.  GENITOURINARY: No dysuria, hematuria.  ENDOCRINE: No polyuria, nocturia,  HEMATOLOGY: No anemia, easy bruising or bleeding SKIN: No rash or lesion. MUSCULOSKELETAL: No joint pain or arthritis.   NEUROLOGIC: No tingling, numbness, weakness.  PSYCHIATRY: No anxiety or depression.   ROS  DRUG ALLERGIES:   Allergies  Allergen Reactions  . Lipitor [Atorvastatin]     myalgias    VITALS:  Blood pressure 139/79, pulse 69, temperature 98.7 F (37.1 C), temperature source Oral, resp. rate 17, height 5\' 9"  (1.753 m), weight 123.7 kg, SpO2 98 %.  PHYSICAL EXAMINATION:  GENERAL:  67 y.o.-year-old patient lying in the bed with no acute distress.  EYES: Pupils equal, round, reactive to light and accommodation. No scleral icterus. Extraocular muscles intact.  HEENT: Head atraumatic, normocephalic. Oropharynx and nasopharynx clear.  NECK:  Supple, no jugular venous distention. No thyroid enlargement, no tenderness.  LUNGS: Normal breath sounds bilaterally, no wheezing, rales,rhonchi or crepitation. No use of accessory muscles of respiration.  CARDIOVASCULAR: S1, S2 fast and regular. No murmurs, rubs, or gallops.  ABDOMEN: Soft, nontender, nondistended. Bowel sounds present. No organomegaly or mass. Some swelling on foreskin on penis and  slight yellowish discharge. EXTREMITIES: No pedal edema, cyanosis, or clubbing.  NEUROLOGIC: Cranial nerves II through XII are intact. Muscle strength 5/5 in all extremities. Sensation intact. Gait not checked.  PSYCHIATRIC: The patient is alert and oriented x 3.  SKIN: No obvious rash, lesion, or ulcer.   Physical Exam LABORATORY PANEL:   CBC Recent Labs  Lab 01/06/18 0537  WBC 7.1  HGB 12.7*  HCT 37.2*  PLT 164   ------------------------------------------------------------------------------------------------------------------  Chemistries  Recent Labs  Lab 01/06/18 0537  NA 140  K 3.8  CL 108  CO2 25  GLUCOSE 261*  BUN 11  CREATININE 0.98  CALCIUM 7.7*   ------------------------------------------------------------------------------------------------------------------  Cardiac Enzymes Recent Labs  Lab 01/05/18 1741 01/05/18 2230  TROPONINI <0.03 <0.03   ------------------------------------------------------------------------------------------------------------------  RADIOLOGY:  Dg Chest 1 View  Result Date: 01/05/2018 CLINICAL DATA:  Hypoxia EXAM: CHEST  1 VIEW COMPARISON:  01/04/2018 and prior radiographs FINDINGS: The cardiomediastinal silhouette is unremarkable. Mild peribronchial thickening noted. There is no evidence of focal airspace disease, pulmonary edema, suspicious pulmonary nodule/mass, pleural effusion, or pneumothorax. No acute bony abnormalities are identified. IMPRESSION: No acute abnormality. Electronically Signed   By: Margarette Canada M.D.   On: 01/05/2018 18:43   Dg Chest 2 View  Result Date: 01/04/2018 CLINICAL DATA:  Fever this morning with generalized weakness. EXAM: CHEST - 2 VIEW COMPARISON:  09/07/2017 FINDINGS: Lungs are adequately inflated without consolidation or effusion. Cardiomediastinal silhouette and remainder of the exam is unchanged. IMPRESSION: No active cardiopulmonary disease. Electronically Signed   By: Marin Olp M.D.   On:  01/04/2018 12:37    ASSESSMENT AND PLAN:   Active Problems:  Sepsis [facility])  This is a 67 year old male admitted for sepsis. 1.  Sepsis: The patient meets criteria via fever, leukocytosis and lactic acidosis.  He is hemodynamically stable. Continue acyclovir and doxycycline.  Source may be balanitis/cellulitis of glans penis. 2.  Balanitis:  Urology consult appreciated.  Patient is uncircumcised.    Follow in office after 7 days of Abx course. 3.  CAD: Stable; continue aspirin and Plavix.  Also continue Imdur 4.  Hypertension: Controlled.  Continue , hydralazine, losartan and carvedilol. 5.  RVR with SVT: Continue amiodarone- Given Inj metoprolol and transferred to tele, follow serial troponin, Get echo and Cardiology consult. 6.  Diabetes mellitus type 2: continue basal insulin.  Sliding scale insulin while hospitalized. 7.  Hyperlipidemia: Continue statin therapy 8.  DVT prophylaxis: Lovenox 9.  GI prophylaxis: Pantoprazole per home regimen    All the records are reviewed and case discussed with Care Management/Social Workerr. Management plans discussed with the patient, family and they are in agreement.  CODE STATUS: Full.  TOTAL TIME TAKING CARE OF THIS PATIENT: 35 minutes.    POSSIBLE D/C IN 1-2 DAYS, DEPENDING ON CLINICAL CONDITION.   Vaughan Basta M.D on 01/06/2018   Between 7am to 6pm - Pager - [phone]  After 6pm go to [email or web address] - password [email or web address] [facility]  Office  [phone]  CC: Primary care physician; Dionisio David, MD  Note: This dictation was prepared with Dragon dictation along with smaller phrase technology. Any transcriptional errors that result from this process are unintentional.

## 2018-01-06 NOTE — Plan of Care (Signed)
  Problem: Clinical Measurements: Goal: Respiratory complications will improve Outcome: Progressing Goal: Cardiovascular complication will be avoided Outcome: Progressing   Problem: Activity: Goal: Risk for activity intolerance will decrease Outcome: Progressing   Problem: Pain Managment: Goal: General experience of comfort will improve Outcome: Progressing   

## 2018-01-06 NOTE — Progress Notes (Signed)
Inpatient Diabetes Program Recommendations  AACE/ADA: New Consensus Statement on Inpatient Glycemic Control (2019)  Target Ranges:  Prepandial:   less than 140 mg/dL      Peak postprandial:   less than 180 mg/dL (1-2 hours)      Critically ill patients:  140 - 180 mg/dL   Results for CHANDON, LAZCANO (MRN [id number]) as of 01/06/2018 08:54  Ref. Range 01/05/2018 07:28 01/05/2018 11:49 01/05/2018 16:42 01/05/2018 21:51 01/06/2018 08:07  Glucose-Capillary Latest Ref Range: 70 - 99 mg/dL 191 (H)  Novolog 4 units 272 (H)  Novolog 11 units 257 (H)  Novolog 11 units 304 (H)    Lantus 28 units 218 (H)  Novolog 7 units   Review of Glycemic Control  Diabetes history: DM2 Outpatient Diabetes medications: Lantus 40 units QHS, Fiasp 4-20 units TID with meals (inhaled rapid acting insulin), Metformin 500 mg BID, Amaryl 2 mg QAM Current orders for Inpatient glycemic control: Lantus 28 units QHS, Novolog 0-20 units TID with meals  Inpatient Diabetes Program Recommendations:  Correction (SSI): Please consider ordering Novolog 0-5 units QHS for bedtime correction. Insulin - Meal Coverage: Please consider ordering Novolog 10 units TID with meals for meal coverage if patient eats at least 50% of meals. HgbA1C: Please consider ordering an A1C to evaluate glycemic control over the past 2-3 months.  Thanks, Barnie Alderman, RN, MSN, CDE Diabetes Coordinator Inpatient Diabetes Program [phone] (Team Pager from 8am to 5pm)

## 2018-01-06 NOTE — Discharge Summary (Signed)
James Maxwell at [place] NAME: James Maxwell    MR#:  [id number]  DATE OF BIRTH:  01-06-1951  DATE OF ADMISSION:  01/04/2018 ADMITTING PHYSICIAN: Harrie Foreman, MD  DATE OF DISCHARGE: 01/06/2018   PRIMARY CARE PHYSICIAN: Dionisio David, MD    ADMISSION DIAGNOSIS:  Sepsis, due to unspecified organism [facility]) [A41.9] Cellulitis of other specified site [L03.818]  DISCHARGE DIAGNOSIS:  Active Problems:   Sepsis ([place])  SVT with RVR  SECONDARY DIAGNOSIS:   Past Medical History:  Diagnosis Date  . Arthritis   . Chronic diastolic CHF (congestive heart failure) ([place])    a. 04/2014 Echo: EF 55%, Gr1 DD.  [name] Coronary artery disease    a. 2006 s/p PCI RCA;  b. 2012 MV: no ischemia.  . Diabetes mellitus without complication ([place])   . GERD (gastroesophageal reflux disease)   . History of gout   . Hyperlipidemia   . Hypertension   . MI, old 26  . Stroke ([place])   . SVT (supraventricular tachycardia) [facility])     HOSPITAL COURSE:   This is a 67 year old male admitted for sepsis. 1. Sepsis: The patient meets criteria via fever, leukocytosis and lactic acidosis. He is hemodynamically stable.Continue acyclovir and doxycycline. Source may be balanitis/cellulitis of glans penis.  total 7 days course as per urology. 2. Balanitis: Urology consult appreciated. Patient is uncircumcised.    Follow in office after 7 days of Abx course. 3. CAD: Stable; continue aspirin and Plavix. Also continue Imdur 4. Hypertension: Controlled. Continue , hydralazine, losartan and carvedilol. 5.  RVR with SVT: Continue amiodarone- Given Inj metoprolol and transferred to tele, follow serial troponin, Get echo and Cardiology consult.  serial troponin negative.Increased amiodarone dose, helped to convert to NSR. 6. Diabetes mellitus type 2: continue basal insulin. Sliding scale insulin while hospitalized. 7. Hyperlipidemia: Continue statin therapy 8.  DVT prophylaxis: Lovenox 9. GI prophylaxis: Pantoprazole per home regimen   DISCHARGE CONDITIONS:   Stable.  CONSULTS OBTAINED:  Treatment Team:  Dionisio David, MD  DRUG ALLERGIES:   Allergies  Allergen Reactions  . Lipitor [Atorvastatin]     myalgias    DISCHARGE MEDICATIONS:   Allergies as of 01/06/2018      Reactions   Lipitor [atorvastatin]    myalgias      Medication List    STOP taking these medications   amLODipine 10 MG tablet Commonly known as:  NORVASC   insulin lispro 100 UNIT/ML injection Commonly known as:  HUMALOG     TAKE these medications   acyclovir 200 MG capsule Commonly known as:  ZOVIRAX Take 2 capsules (400 mg total) by mouth 3 (three) times daily for 5 days.   albuterol 108 (90 Base) MCG/ACT inhaler Commonly known as:  PROVENTIL HFA;VENTOLIN HFA Inhale 1-2 puffs into the lungs every 4 (four) hours as needed for wheezing or shortness of breath. What changed:  how much to take   albuterol (2.5 MG/3ML) 0.083% nebulizer solution Commonly known as:  PROVENTIL Take 3 mLs (2.5 mg total) by nebulization every 4 (four) hours as needed for wheezing or shortness of breath. What changed:  Another medication with the same name was changed. Make sure you understand how and when to take each.   amiodarone 400 MG tablet Commonly known as:  PACERONE Take 1 tablet (400 mg total) by mouth 2 (two) times daily. What changed:    medication strength  how much to take  when to take  this   aspirin EC 81 MG tablet Take 81 mg by mouth daily.   carvedilol 3.125 MG tablet Commonly known as:  COREG Take 3.125 mg by mouth 2 (two) times daily.   CLARITIN 10 MG tablet Generic drug:  loratadine Take 10 mg by mouth daily.   clopidogrel 75 MG tablet Commonly known as:  PLAVIX Take 75 mg by mouth daily.   diltiazem 120 MG 24 hr capsule Commonly known as:  CARDIZEM CD Take 1 capsule (120 mg total) by mouth daily.   doxycycline 100 MG  tablet Commonly known as:  VIBRA-TABS Take 1 tablet (100 mg total) by mouth every 12 (twelve) hours for 5 days.   FIASP 100 UNIT/ML injection Generic drug:  insulin aspart Inject 4-20 Units into the skin 3 (three) times daily before meals. 131-180=4 units, 181-240=8 units, 241-300=10 units, 301-350=12 units, 351-400=16 units, and if blood sugar is greater than 400=20 units and call MD   furosemide 20 MG tablet Commonly known as:  LASIX Take 20 mg by mouth daily.   glimepiride 4 MG tablet Commonly known as:  AMARYL Take 2 mg by mouth daily with breakfast.   hydrALAZINE 25 MG tablet Commonly known as:  APRESOLINE Take 25 mg by mouth 2 (two) times daily.   isosorbide mononitrate 30 MG 24 hr tablet Commonly known as:  IMDUR Take 30 mg by mouth daily.   LANTUS SOLOSTAR 100 UNIT/ML Solostar Pen Generic drug:  Insulin Glargine Inject 40 Units into the skin at bedtime.   losartan 100 MG tablet Commonly known as:  COZAAR Take 100 mg by mouth daily.   metFORMIN 500 MG tablet Commonly known as:  GLUCOPHAGE Take 500 mg by mouth 2 (two) times daily with a meal.   nitroGLYCERIN 0.4 MG SL tablet Commonly known as:  NITROSTAT Place 1 tablet (0.4 mg total) under the tongue every 5 (five) minutes as needed for chest pain.   ondansetron 4 MG tablet Commonly known as:  ZOFRAN Take 4 mg by mouth 2 (two) times daily as needed for nausea or vomiting.   pantoprazole 40 MG tablet Commonly known as:  PROTONIX Take 40 mg by mouth daily.   rosuvastatin 10 MG tablet Commonly known as:  CRESTOR Take 10 mg by mouth daily.   Vitamin D 2000 units tablet Take 2,000 Units by mouth daily.        DISCHARGE INSTRUCTIONS:    Follow with urology and cardiology clinic in 1-2 weeks.  If you experience worsening of your admission symptoms, develop shortness of breath, life threatening emergency, suicidal or homicidal thoughts you must seek medical attention immediately by calling 911 or calling  your MD immediately  if symptoms less severe.  You Must read complete instructions/literature along with all the possible adverse reactions/side effects for all the Medicines you take and that have been prescribed to you. Take any new Medicines after you have completely understood and accept all the possible adverse reactions/side effects.   Please note  You were cared for by a hospitalist during your hospital stay. If you have any questions about your discharge medications or the care you received while you were in the hospital after you are discharged, you can call the unit and asked to speak with the hospitalist on call if the hospitalist that took care of you is not available. Once you are discharged, your primary care physician will handle any further medical issues. Please note that NO REFILLS for any discharge medications will be authorized once you  are discharged, as it is imperative that you return to your primary care physician (or establish a relationship with a primary care physician if you do not have one) for your aftercare needs so that they can reassess your need for medications and monitor your lab values.    Today   CHIEF COMPLAINT:   Chief Complaint  Patient presents with  . Fever  . Groin Pain    HISTORY OF PRESENT ILLNESS:  James Maxwell  is a 67 y.o. male with a known history of CHF, diabetes, hypertension status post stroke and MI with subsequent PCI to the RCA presents to the emergency department complaining of chills.  Upon presentation the patient was febrile to 102.6 F.  He admits to feeling intermittent nausea and admits that his blood sugar was elevated this morning to 188 prior to breakfast.  In the emergency department the patient was found to have lactic acidosis as well as leukocytosis and hyperglycemia.  He is complained of some irritation and pain around his penis.  Analysis was negative for bacterial infection.  Blood cultures were obtained in the emergency  department the patient was started on broad-spectrum antibiotics prior to the emergency department staff calling the hospitalist team for admission.   VITAL SIGNS:  Blood pressure 139/79, pulse 69, temperature 98.7 F (37.1 C), temperature source Oral, resp. rate 17, height 5\' 9"  (1.753 m), weight 123.7 kg, SpO2 98 %.  I/O:    Intake/Output Summary (Last 24 hours) at 01/06/2018 0946 Last data filed at 01/06/2018 0600 Gross per 24 hour  Intake 2400 ml  Output 3050 ml  Net -650 ml    PHYSICAL EXAMINATION:  GENERAL:  67 y.o.-year-old patient lying in the bed with no acute distress.  EYES: Pupils equal, round, reactive to light and accommodation. No scleral icterus. Extraocular muscles intact.  HEENT: Head atraumatic, normocephalic. Oropharynx and nasopharynx clear.  NECK:  Supple, no jugular venous distention. No thyroid enlargement, no tenderness.  LUNGS: Normal breath sounds bilaterally, no wheezing, rales,rhonchi or crepitation. No use of accessory muscles of respiration.  CARDIOVASCULAR: S1, S2 normal. No murmurs, rubs, or gallops.  ABDOMEN: Soft, non-tender, non-distended. Bowel sounds present. No organomegaly or mass.  EXTREMITIES: No pedal edema, cyanosis, or clubbing.  NEUROLOGIC: Cranial nerves II through XII are intact. Muscle strength 5/5 in all extremities. Sensation intact. Gait not checked.  PSYCHIATRIC: The patient is alert and oriented x 3.  SKIN: No obvious rash, lesion, or ulcer.   DATA REVIEW:   CBC Recent Labs  Lab 01/06/18 0537  WBC 7.1  HGB 12.7*  HCT 37.2*  PLT 164    Chemistries  Recent Labs  Lab 01/06/18 0537  NA 140  K 3.8  CL 108  CO2 25  GLUCOSE 261*  BUN 11  CREATININE 0.98  CALCIUM 7.7*    Cardiac Enzymes Recent Labs  Lab 01/05/18 2230  TROPONINI <0.03    Microbiology Results  Results for orders placed or performed during the hospital encounter of 01/04/18  Culture, blood (routine x 2)     Status: None (Preliminary result)    Collection Time: 01/04/18 12:26 PM  Result Value Ref Range Status   Specimen Description BLOOD R AC  Final   Special Requests   Final    BOTTLES DRAWN AEROBIC AND ANAEROBIC Blood Culture adequate volume   Culture   Final    NO GROWTH 2 DAYS Performed at [facility], [address], [place], [place] [address]    Report Status  PENDING  Incomplete  Culture, blood (routine x 2)     Status: None (Preliminary result)   Collection Time: 01/04/18 12:45 PM  Result Value Ref Range Status   Specimen Description BLOOD L AC  Final   Special Requests   Final    BOTTLES DRAWN AEROBIC AND ANAEROBIC Blood Culture results may not be optimal due to an inadequate volume of blood received in culture bottles   Culture   Final    NO GROWTH 2 DAYS Performed at [facility], [address], [place], [place] [address]    Report Status PENDING  Incomplete    RADIOLOGY:  Dg Chest 1 View  Result Date: 01/05/2018 CLINICAL DATA:  Hypoxia EXAM: CHEST  1 VIEW COMPARISON:  01/04/2018 and prior radiographs FINDINGS: The cardiomediastinal silhouette is unremarkable. Mild peribronchial thickening noted. There is no evidence of focal airspace disease, pulmonary edema, suspicious pulmonary nodule/mass, pleural effusion, or pneumothorax. No acute bony abnormalities are identified. IMPRESSION: No acute abnormality. Electronically Signed   By: Margarette Canada M.D.   On: 01/05/2018 18:43   Dg Chest 2 View  Result Date: 01/04/2018 CLINICAL DATA:  Fever this morning with generalized weakness. EXAM: CHEST - 2 VIEW COMPARISON:  09/07/2017 FINDINGS: Lungs are adequately inflated without consolidation or effusion. Cardiomediastinal silhouette and remainder of the exam is unchanged. IMPRESSION: No active cardiopulmonary disease. Electronically Signed   By: Marin Olp M.D.   On: 01/04/2018 12:37    EKG:   Orders placed or performed during the hospital encounter of 09/07/17  . EKG 12-Lead  . EKG 12-Lead       Management plans discussed with the patient, family and they are in agreement.  CODE STATUS:     Code Status Orders  (From admission, onward)         Start     Ordered   01/04/18 1446  Full code  Continuous     01/04/18 1445        Code Status History    Date Active Date Inactive Code Status Order ID Comments User Context   01/25/2017 1423 01/27/2017 0024 Full Code [id number]  Yolonda Kida, MD Inpatient   01/25/2017 1422 01/25/2017 1423 Full Code [id number]  Yolonda Kida, MD Inpatient   05/17/2016 0422 05/18/2016 1544 Full Code [id number]  Harrie Foreman, MD Inpatient   04/13/2016 0046 04/13/2016 1935 Full Code [id number]  Harvie Bridge, DO Inpatient   07/20/2013 1917 07/22/2013 1914 Full Code [id number]  Gearlean Alf, MD Inpatient      TOTAL TIME TAKING CARE OF THIS PATIENT: 35 minutes.    Vaughan Basta M.D on 01/06/2018 at 9:46 AM  Between 7am to 6pm - Pager - [phone]  After 6pm go to [email or web address] - password [email or web address] [facility]  Office  [phone]  CC: Primary care physician; Dionisio David, MD   Note: This dictation was prepared with Dragon dictation along with smaller phrase technology. Any transcriptional errors that result from this process are unintentional.

## 2018-01-07 LAB — RPR: RPR Ser Ql: NONREACTIVE

## 2018-01-08 LAB — HSV 1 AND 2 IGM ABS, INDIRECT
HSV 1 IgM Antibodies: <1:10 {titer}
HSV 2 IgM Antibodies: <1:10 {titer}

## 2018-01-09 LAB — CULTURE, BLOOD (ROUTINE X 2)
Culture: NO GROWTH
Culture: NO GROWTH
Special Requests: ADEQUATE

## 2018-01-21 ENCOUNTER — Ambulatory Visit (INDEPENDENT_AMBULATORY_CARE_PROVIDER_SITE_OTHER): Payer: Medicare HMO | Admitting: Urology

## 2018-01-21 ENCOUNTER — Encounter: Payer: Self-pay | Admitting: Urology

## 2018-01-21 VITALS — BP 166/79 | HR 76 | Ht 69.0 in | Wt 267.2 lb

## 2018-01-21 DIAGNOSIS — N4822 Cellulitis of corpus cavernosum and penis: Secondary | ICD-10-CM | POA: Diagnosis not present
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
## 2018-02-10 ENCOUNTER — Telehealth: Payer: Self-pay | Admitting: Radiology

## 2018-02-10 NOTE — Telephone Encounter (Signed)
Clearance received from patient's cardiologist to proceed with surgery & that patient may hold aspirin & Plavix x 6 days prior to surgery & restart as soon as possible following surgery.  Dr Diamantina Providence - will need orders for circumcision.

## 2018-02-13 NOTE — Telephone Encounter (Signed)
Patient notified that surgery has been scheduled for 03-07-18 w/Dr. Diamantina Providence. Patient was told that we have clearance for him to stop Plavix and ASA 6 days prior from cardiology. Patient verbalized understanding. Will call with PAT apt

## 2018-02-18 ENCOUNTER — Other Ambulatory Visit: Payer: Self-pay | Admitting: Radiology

## 2018-02-18 DIAGNOSIS — N4822 Cellulitis of corpus cavernosum and penis: Secondary | ICD-10-CM

## 2018-02-27 ENCOUNTER — Other Ambulatory Visit: Payer: Self-pay

## 2018-02-27 ENCOUNTER — Encounter
Admission: RE | Admit: 2018-02-27 | Discharge: 2018-02-27 | Disposition: A | Discharge status: Home / Self Care | Payer: Medicare HMO | Source: Ambulatory Visit | Attending: Urology | Admitting: Urology

## 2018-02-27 DIAGNOSIS — Z01812 Encounter for preprocedural laboratory examination: Secondary | ICD-10-CM | POA: Insufficient documentation

## 2018-02-27 HISTORY — DX: Sleep apnea, unspecified: G47.30

## 2018-02-27 HISTORY — DX: Chronic obstructive pulmonary disease, unspecified: J44.9

## 2018-02-27 LAB — BASIC METABOLIC PANEL
ANION GAP: 10 (ref 5–15)
BUN: 9 mg/dL (ref 8–23)
CALCIUM: 8.7 mg/dL — AB (ref 8.9–10.3)
CO2: 30 mmol/L (ref 22–32)
CREATININE: 1.12 mg/dL (ref 0.61–1.24)
Chloride: 102 mmol/L (ref 98–111)
GFR calc non Af Amer: >60 mL/min (ref >60)
GLUCOSE: 145 mg/dL — AB (ref 70–99)
Potassium: 3.5 mmol/L (ref 3.5–5.1)
Sodium: 142 mmol/L (ref 135–145)

## 2018-02-27 NOTE — Patient Instructions (Signed)
Your procedure is scheduled on: Friday 03/07/18 Report to [place]. To find out your arrival time please call [phone] between 1PM - 3PM on Thursday 03/06/18.  Remember: Instructions that are not followed completely may result in serious medical risk, up to and including death, or upon the discretion of your surgeon and anesthesiologist your surgery may need to be rescheduled.     _X__ 1. Do not eat food after midnight the night before your procedure.                 No gum chewing or hard candies. You may drink clear liquids up to 2 hours                 before you are scheduled to arrive for your surgery- DO not drink clear                 liquids within 2 hours of the start of your surgery.                 Clear Liquids include:  water, apple juice without pulp, clear carbohydrate                 drink such as Clearfast or Gatorade, Black Coffee or Tea (Do not add                 anything to coffee or tea).  __X__2.  On the morning of surgery brush your teeth with toothpaste and water, you                 may rinse your mouth with mouthwash if you wish.  Do not swallow any              toothpaste of mouthwash.     _X__ 3.  No Alcohol for 24 hours before or after surgery.   _X__ 4.  Do Not Smoke or use e-cigarettes For 24 Hours Prior to Your Surgery.                 Do not use any chewable tobacco products for at least 6 hours prior to                 surgery.  ____  5.  Bring all medications with you on the day of surgery if instructed.   __X__  6.  Notify your doctor if there is any change in your medical condition      (cold, fever, infections).     Do not wear jewelry, make-up, hairpins, clips or nail polish. Do not wear lotions, powders, or perfumes.  Do not shave 48 hours prior to surgery. Men may shave face and neck. Do not bring valuables to the hospital.    [facility] is not responsible for any belongings or  valuables.  Contacts, dentures/partials or body piercings may not be worn into surgery. Bring a case for your contacts, glasses or hearing aids, a denture cup will be supplied. Leave your suitcase in the car. After surgery it may be brought to your room. For patients admitted to the hospital, discharge time is determined by your treatment team.   Patients discharged the day of surgery will not be allowed to drive home.   Please read over the following fact sheets that you were given:   MRSA Information  __X__ Take these medicines the morning of surgery with A SIP OF WATER:  1. amiodarone (PACERONE)  2. carvedilol (COREG)  3. diltiazem (CARDIZEM   4. hydrALAZINE (APRESOLINE)  5. isosorbide mononitrate (IMDUR)   6. pantoprazole (PROTONIX)  7. rosuvastatin (CRESTOR)  ____ Fleet Enema (as directed)   __X__ Use CHG Soap/SAGE wipes as directed  __X__ Use inhalers on the day of surgery AND NEBULIZER TREATMENT  __X__ Stop METFORMIN/Janumet/Farxiga 2 days prior to surgery    __X__ Take 1/2 of usual insulin dose the night before surgery. No insulin the morning  TAKE 37 UNITS AT BEDTIME INSTEAD OF 75 UNITS          of surgery.   __X__ Stop Blood Thinners Coumadin/Plavix/Xarelto/Pleta/Pradaxa/Eliquis/Effient/  on   Or contact your Surgeon, Cardiologist or Medical Doctor regarding  ability to stop your blood thinners  __X__ Stop Anti-inflammatories 7 days before surgery such as Advil, Ibuprofen, Motrin,  BC or Goodies Powder, Naprosyn, Naproxen, Aleve   __X__ Stop all herbal supplements, fish oil or vitamin E until after surgery.    __X__ Bring C-Pap to the hospital.

## 2018-02-27 NOTE — Pre-Procedure Instructions (Addendum)
Copied from ED note from below date  EKG   ED ECG REPORT I, SUNG,JADE J, the attending physician, personally viewed and interpreted this ECG.   Date: 09/07/2017  EKG Time: 0120  Rate: 77  Rhythm: normal EKG, normal sinus rhythm  Axis: Normal  Intervals:none  ST&T Change: Nonspecific

## 2018-03-06 MED ORDER — CEFAZOLIN SODIUM-DEXTROSE 2-4 GM/100ML-% IV SOLN
2.0000 g | INTRAVENOUS | Status: AC
  Administered 2018-03-07: 2 g via INTRAVENOUS

## 2018-03-07 ENCOUNTER — Encounter
Admission: RE | Disposition: A | Discharge status: Home / Self Care | Payer: Self-pay | Source: Ambulatory Visit | Attending: Urology

## 2018-03-07 ENCOUNTER — Encounter: Payer: Self-pay | Admitting: Certified Registered Nurse Anesthetist

## 2018-03-07 ENCOUNTER — Ambulatory Visit: Payer: Medicare HMO | Admitting: Anesthesiology

## 2018-03-07 ENCOUNTER — Ambulatory Visit
Admission: RE | Admit: 2018-03-07 | Discharge: 2018-03-07 | Disposition: A | Discharge status: Home / Self Care | Payer: Medicare HMO | Source: Ambulatory Visit | Attending: Urology | Admitting: Urology

## 2018-03-07 ENCOUNTER — Other Ambulatory Visit: Payer: Self-pay

## 2018-03-07 ENCOUNTER — Encounter: Payer: Self-pay | Admitting: Emergency Medicine

## 2018-03-07 ENCOUNTER — Telehealth: Payer: Self-pay | Admitting: Urology

## 2018-03-07 ENCOUNTER — Emergency Department
Admission: EM | Admit: 2018-03-07 | Discharge: 2018-03-07 | Disposition: A | Discharge status: Home / Self Care | Payer: Medicare HMO | Source: Home / Self Care | Attending: Emergency Medicine | Admitting: Emergency Medicine

## 2018-03-07 DIAGNOSIS — E119 Type 2 diabetes mellitus without complications: Secondary | ICD-10-CM

## 2018-03-07 DIAGNOSIS — Z794 Long term (current) use of insulin: Secondary | ICD-10-CM

## 2018-03-07 DIAGNOSIS — Z7902 Long term (current) use of antithrombotics/antiplatelets: Secondary | ICD-10-CM | POA: Insufficient documentation

## 2018-03-07 DIAGNOSIS — Z96653 Presence of artificial knee joint, bilateral: Secondary | ICD-10-CM | POA: Insufficient documentation

## 2018-03-07 DIAGNOSIS — N4822 Cellulitis of corpus cavernosum and penis: Secondary | ICD-10-CM

## 2018-03-07 DIAGNOSIS — Z955 Presence of coronary angioplasty implant and graft: Secondary | ICD-10-CM | POA: Insufficient documentation

## 2018-03-07 DIAGNOSIS — N5089 Other specified disorders of the male genital organs: Secondary | ICD-10-CM

## 2018-03-07 DIAGNOSIS — E785 Hyperlipidemia, unspecified: Secondary | ICD-10-CM | POA: Insufficient documentation

## 2018-03-07 DIAGNOSIS — Z79899 Other long term (current) drug therapy: Secondary | ICD-10-CM

## 2018-03-07 DIAGNOSIS — M109 Gout, unspecified: Secondary | ICD-10-CM | POA: Diagnosis not present

## 2018-03-07 DIAGNOSIS — J449 Chronic obstructive pulmonary disease, unspecified: Secondary | ICD-10-CM

## 2018-03-07 DIAGNOSIS — I2510 Atherosclerotic heart disease of native coronary artery without angina pectoris: Secondary | ICD-10-CM

## 2018-03-07 DIAGNOSIS — I5032 Chronic diastolic (congestive) heart failure: Secondary | ICD-10-CM

## 2018-03-07 DIAGNOSIS — N481 Balanitis: Secondary | ICD-10-CM | POA: Insufficient documentation

## 2018-03-07 DIAGNOSIS — Z87891 Personal history of nicotine dependence: Secondary | ICD-10-CM | POA: Insufficient documentation

## 2018-03-07 DIAGNOSIS — K219 Gastro-esophageal reflux disease without esophagitis: Secondary | ICD-10-CM | POA: Diagnosis not present

## 2018-03-07 DIAGNOSIS — I110 Hypertensive heart disease with heart failure: Secondary | ICD-10-CM | POA: Insufficient documentation

## 2018-03-07 DIAGNOSIS — G4730 Sleep apnea, unspecified: Secondary | ICD-10-CM | POA: Insufficient documentation

## 2018-03-07 DIAGNOSIS — Z8673 Personal history of transient ischemic attack (TIA), and cerebral infarction without residual deficits: Secondary | ICD-10-CM | POA: Insufficient documentation

## 2018-03-07 HISTORY — PX: CIRCUMCISION: SHX1350

## 2018-03-07 LAB — GLUCOSE, CAPILLARY
GLUCOSE-CAPILLARY: 147 mg/dL — AB (ref 70–99)
GLUCOSE-CAPILLARY: 144 mg/dL — AB (ref 70–99)

## 2018-03-07 SURGERY — CIRCUMCISION, ADULT
Anesthesia: General | Site: Penis

## 2018-03-07 MED ORDER — DEXAMETHASONE SODIUM PHOSPHATE 10 MG/ML IJ SOLN
INTRAMUSCULAR | Status: AC
  Filled 2018-03-07: qty 1

## 2018-03-07 MED ORDER — ONDANSETRON HCL 4 MG/2ML IJ SOLN
INTRAMUSCULAR | Status: AC
  Filled 2018-03-07: qty 2

## 2018-03-07 MED ORDER — HYDROCODONE-ACETAMINOPHEN 5-325 MG PO TABS
ORAL_TABLET | ORAL | Status: AC
  Filled 2018-03-07: qty 1

## 2018-03-07 MED ORDER — FENTANYL CITRATE (PF) 100 MCG/2ML IJ SOLN
INTRAMUSCULAR | Status: AC
  Filled 2018-03-07: qty 2

## 2018-03-07 MED ORDER — EPHEDRINE SULFATE 50 MG/ML IJ SOLN
INTRAMUSCULAR | Status: DC | PRN
  Administered 2018-03-07 (×2): 5 mg via INTRAVENOUS

## 2018-03-07 MED ORDER — PROPOFOL 10 MG/ML IV BOLUS
INTRAVENOUS | Status: AC
  Filled 2018-03-07: qty 20

## 2018-03-07 MED ORDER — OXYCODONE-ACETAMINOPHEN 5-325 MG PO TABS
1.5000 | ORAL_TABLET | ORAL | Status: AC
  Administered 2018-03-07: 1.5 via ORAL
  Filled 2018-03-07: qty 2

## 2018-03-07 MED ORDER — MIDAZOLAM HCL 2 MG/2ML IJ SOLN
INTRAMUSCULAR | Status: DC | PRN
  Administered 2018-03-07 (×2): 1 mg via INTRAVENOUS

## 2018-03-07 MED ORDER — LIDOCAINE HCL (CARDIAC) PF 100 MG/5ML IV SOSY
PREFILLED_SYRINGE | INTRAVENOUS | Status: DC | PRN
  Administered 2018-03-07: 80 mg via INTRAVENOUS

## 2018-03-07 MED ORDER — GLYCOPYRROLATE 0.2 MG/ML IJ SOLN
INTRAMUSCULAR | Status: DC | PRN
  Administered 2018-03-07: 0.2 mg via INTRAVENOUS

## 2018-03-07 MED ORDER — CEFAZOLIN SODIUM-DEXTROSE 2-4 GM/100ML-% IV SOLN
INTRAVENOUS | Status: AC
  Filled 2018-03-07: qty 100

## 2018-03-07 MED ORDER — HYDROCODONE-ACETAMINOPHEN 5-325 MG PO TABS: 1.0000 | ORAL_TABLET | Freq: Once | ORAL | Status: DC

## 2018-03-07 MED ORDER — HYDROCODONE-ACETAMINOPHEN 5-325 MG PO TABS: 1.0000 | ORAL_TABLET | ORAL | 0 refills | Status: AC | PRN

## 2018-03-07 MED ORDER — ACETAMINOPHEN 10 MG/ML IV SOLN
INTRAVENOUS | Status: DC | PRN
  Administered 2018-03-07: 1000 mg via INTRAVENOUS

## 2018-03-07 MED ORDER — LIDOCAINE HCL (PF) 2 % IJ SOLN
INTRAMUSCULAR | Status: AC
  Filled 2018-03-07: qty 10

## 2018-03-07 MED ORDER — SUGAMMADEX SODIUM 200 MG/2ML IV SOLN
INTRAVENOUS | Status: AC
  Filled 2018-03-07: qty 2

## 2018-03-07 MED ORDER — ONDANSETRON HCL 4 MG/2ML IJ SOLN
INTRAMUSCULAR | Status: DC | PRN
  Administered 2018-03-07: 4 mg via INTRAVENOUS

## 2018-03-07 MED ORDER — ALBUTEROL SULFATE HFA 108 (90 BASE) MCG/ACT IN AERS
INHALATION_SPRAY | RESPIRATORY_TRACT | Status: DC | PRN
  Administered 2018-03-07 (×2): 4 via RESPIRATORY_TRACT
  Administered 2018-03-07: 6 via RESPIRATORY_TRACT

## 2018-03-07 MED ORDER — BUPIVACAINE HCL (PF) 0.25 % IJ SOLN
INTRAMUSCULAR | Status: DC | PRN
  Administered 2018-03-07: 15 mL

## 2018-03-07 MED ORDER — OXYCODONE HCL 5 MG PO TABS: 5.0000 mg | ORAL_TABLET | Freq: Once | ORAL | Status: DC | PRN

## 2018-03-07 MED ORDER — FENTANYL CITRATE (PF) 100 MCG/2ML IJ SOLN: 25.0000 ug | INTRAMUSCULAR | Status: DC | PRN

## 2018-03-07 MED ORDER — LIDOCAINE HCL URETHRAL/MUCOSAL 2 % EX GEL
1.0000 "application " | Freq: Once | CUTANEOUS | Status: AC
  Administered 2018-03-07: 1 via URETHRAL
  Filled 2018-03-07: qty 10

## 2018-03-07 MED ORDER — BUPIVACAINE HCL (PF) 0.25 % IJ SOLN
INTRAMUSCULAR | Status: AC
  Filled 2018-03-07: qty 30

## 2018-03-07 MED ORDER — FENTANYL CITRATE (PF) 100 MCG/2ML IJ SOLN
INTRAMUSCULAR | Status: DC | PRN
  Administered 2018-03-07 (×2): 50 ug via INTRAVENOUS

## 2018-03-07 MED ORDER — PROPOFOL 10 MG/ML IV BOLUS
INTRAVENOUS | Status: DC | PRN
  Administered 2018-03-07: 120 mg via INTRAVENOUS

## 2018-03-07 MED ORDER — PHENYLEPHRINE HCL 10 MG/ML IJ SOLN
INTRAMUSCULAR | Status: DC | PRN
  Administered 2018-03-07: 100 ug via INTRAVENOUS

## 2018-03-07 MED ORDER — ACETAMINOPHEN 10 MG/ML IV SOLN
INTRAVENOUS | Status: AC
  Filled 2018-03-07: qty 100

## 2018-03-07 MED ORDER — OXYCODONE HCL 5 MG/5ML PO SOLN: 5.0000 mg | Freq: Once | ORAL | Status: DC | PRN

## 2018-03-07 MED ORDER — BACITRACIN 500 UNIT/GM EX OINT
TOPICAL_OINTMENT | CUTANEOUS | Status: DC | PRN
  Administered 2018-03-07: 1 via TOPICAL

## 2018-03-07 MED ORDER — BACITRACIN ZINC 500 UNIT/GM EX OINT
TOPICAL_OINTMENT | CUTANEOUS | Status: AC
  Filled 2018-03-07: qty 28.35

## 2018-03-07 MED ORDER — MIDAZOLAM HCL 2 MG/2ML IJ SOLN
INTRAMUSCULAR | Status: AC
  Filled 2018-03-07: qty 2

## 2018-03-07 MED ORDER — SODIUM CHLORIDE 0.9 % IV SOLN
INTRAVENOUS | Status: DC
  Administered 2018-03-07: 11:00:00 via INTRAVENOUS

## 2018-03-07 SURGICAL SUPPLY — 29 items
BLADE CLIPPER SURG (BLADE) ×3 IMPLANT
BLADE SURG 15 STRL LF DISP TIS (BLADE) ×1 IMPLANT
BLADE SURG 15 STRL SS (BLADE) ×2
BNDG COHESIVE 1X5 TAN NS LF (GAUZE/BANDAGES/DRESSINGS) IMPLANT
CANISTER SUCT 1200ML W/VALVE (MISCELLANEOUS) ×3 IMPLANT
CHLORAPREP W/TINT 26ML (MISCELLANEOUS) ×3 IMPLANT
COVER WAND RF STERILE (DRAPES) ×3 IMPLANT
DRAPE LAPAROTOMY 77X122 PED (DRAPES) ×3 IMPLANT
ELECT REM PT RETURN 9FT ADLT (ELECTROSURGICAL) ×3
ELECTRODE REM PT RTRN 9FT ADLT (ELECTROSURGICAL) ×1 IMPLANT
GAUZE PETROLATUM 1 X8 (GAUZE/BANDAGES/DRESSINGS) ×3 IMPLANT
GAUZE STRETCH 2X75IN STRL (MISCELLANEOUS) ×3 IMPLANT
GLOVE BIOGEL PI IND STRL 7.5 (GLOVE) ×6 IMPLANT
GLOVE BIOGEL PI INDICATOR 7.5 (GLOVE) ×12
GOWN STRL REUS W/ TWL LRG LVL3 (GOWN DISPOSABLE) ×3 IMPLANT
GOWN STRL REUS W/ TWL XL LVL3 (GOWN DISPOSABLE) ×1 IMPLANT
GOWN STRL REUS W/TWL LRG LVL3 (GOWN DISPOSABLE) ×6
GOWN STRL REUS W/TWL XL LVL3 (GOWN DISPOSABLE) ×2
KIT TURNOVER KIT A (KITS) ×3 IMPLANT
LABEL OR SOLS (LABEL) IMPLANT
NEEDLE HYPO 25X1 1.5 SAFETY (NEEDLE) ×3 IMPLANT
NS IRRIG 500ML POUR BTL (IV SOLUTION) ×3 IMPLANT
PACK BASIN MINOR ARMC (MISCELLANEOUS) ×3 IMPLANT
SOL PREP PVP 2OZ (MISCELLANEOUS)
SOLUTION PREP PVP 2OZ (MISCELLANEOUS) IMPLANT
SUT CHROMIC 3 0 SH 27 (SUTURE) ×3 IMPLANT
SUT VIC AB 3-0 SH 27 (SUTURE) ×2
SUT VIC AB 3-0 SH 27X BRD (SUTURE) ×1 IMPLANT
SYR 10ML LL (SYRINGE) ×3 IMPLANT

## 2018-03-07 NOTE — Discharge Instructions (Addendum)
Return to the ER for new, worsening, persistent severe bleeding, worsening swelling, inability to urinate, fevers, weakness, or any other new or worsening symptoms that concern you.  Follow-up with Dr. Diamantina Providence as scheduled.  No driving tonight.

## 2018-03-07 NOTE — ED Triage Notes (Signed)
First Nurse Note:  C/O post bleeding.  States had a circumcision today.  AAOx3.  Skin warm and dry. NAD

## 2018-03-07 NOTE — ED Provider Notes (Signed)
Patient with successful Foley catheter insertion as he was unable to void beyond a slight dribble.  Is advised by urology, Foley inserted and patient will follow closely with his urologist.  Sherald Hess well, draining clear yellow urine  Return precautions and treatment recommendations and follow-up discussed with the patient who is agreeable with the plan.    Delman Kitten, MD 03/07/18 2324
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
## 2018-03-07 NOTE — OR Nursing (Signed)
While getting dressed, large amt of bleeding noted at op site.  Dr. Diamantina Providence in to see, additional suture and new dressing applied.  MD told patient and spouse not to remove bandage until Monday instead of tomorrow.  Patient tolerated procedure well, advises discomfort 3/10.  Norco requested and given.

## 2018-03-07 NOTE — Progress Notes (Signed)
Urology note  Patient status post circumcision this afternoon.  I was called to the PACU after his dressing had fallen off, and the nurse noted some bleeding.  There is a small skin bleeder at the 12 o'clock position of the incision.  I placed a figure-of-eight suture using a 3-0 chromic which controlled the bleeding.  Careful inspection of the incision demonstrated no other worrisome areas.  The incision was wrapped with Vaseline gauze and snugly wrapped with Coban.  Follow-up in clinic in 6 weeks for wound check  Resume Plavix and aspirin in 5 days  Nickolas Madrid, MD 03/07/2018

## 2018-03-07 NOTE — Discharge Instructions (Signed)
REMOVE DRESSING AT HOME TOMORROW PER DR. Diamantina Providence.  AMBULATORY SURGERY  DISCHARGE INSTRUCTIONS   1) The drugs that you were given will stay in your system until tomorrow so for the next 24 hours you should not:  A) Drive an automobile B) Make any legal decisions C) Drink any alcoholic beverage   2) You may resume regular meals tomorrow.  Today it is better to start with liquids and gradually work up to solid foods.  You may eat anything you prefer, but it is better to start with liquids, then soup and crackers, and gradually work up to solid foods.   3) Please notify your doctor immediately if you have any unusual bleeding, trouble breathing, redness and pain at the surgery site, drainage, fever, or pain not relieved by medication.    4) Additional Instructions:        Please contact your physician with any problems or Same Day Surgery at [phone], Monday through Friday 6 am to 4 pm, or [place] at [facility] number at [phone].

## 2018-03-07 NOTE — OR Nursing (Signed)
Discussed with nurse in [place], who discussed with Dr. Diamantina Providence.  He is aware dressing fell off when getting up to chair and advised for [place] to redress in postop (done with vaseline gauze, 2" conform & 1" thin coban).  Told her to please tell MD I will put in a verbal order for discharge.  Also asked when for pt to follow up.  Per nurse per MD, follow up in 6 weeks.  Called office for appt, advised they rcvd note from MD for follow up in 6-8 weeks.

## 2018-03-07 NOTE — ED Triage Notes (Signed)
Pt reports he had a circumcision done earlier today was having bleeding and pain prior to discharge reports at home pain has increased, continues with bleeding and scrotum area is swollen pt reports a pain medication about 30 minutes ago reports It has help some with the pain but swelling to scrotum is getting uncomfortable, Pt off plavix and aspirin

## 2018-03-07 NOTE — Transfer of Care (Signed)
  Immediate Anesthesia Transfer of Care Note  Patient: James Maxwell  Procedure(s) Performed: CIRCUMCISION ADULT (N/A Penis)  Patient Location: PACU  Anesthesia Type:General  Level of Consciousness: awake  Airway & Oxygen Therapy: Patient Spontanous Breathing and Patient connected to face mask oxygen  Post-op Assessment: Report given to RN and Post -op Vital signs reviewed and stable  Post vital signs: Reviewed and stable  Last Vitals:  Vitals Value Taken Time  BP    Temp    Pulse    Resp    SpO2      Last Pain:  Vitals:   03/07/18 1003  TempSrc: Oral  PainSc: 0-No pain         Complications: No apparent anesthesia complications

## 2018-03-07 NOTE — Op Note (Signed)
Date of procedure: 03/07/18  Preoperative diagnosis:  1. Recurrent balanitis, desire for circumcision  Postoperative diagnosis:  1. Same  Procedure: 1. Circumcision  Surgeon: Nickolas Madrid, MD  Anesthesia: General  Complications: None  Intraoperative findings: Uncomplicated circumcision  EBL: 20 cc  Specimens: None  Drains: None  Indication: James Maxwell is a 67 y.o. patient with multiple comorbidities and recurrent balanitis, including history of sepsis.  After informed consent he elected to undergo circumcision.  After reviewing the management options for treatment, they elected to proceed with the above surgical procedure(s). We have discussed the potential benefits and risks of the procedure, side effects of the proposed treatment, the likelihood of the patient achieving the goals of the procedure, and any potential problems that might occur during the procedure or recuperation. Informed consent has been obtained.  Description of procedure:  The patient was taken to the operating room and general anesthesia was induced.  The patient was placed in the supine position, prepped and draped in the usual sterile fashion, and preoperative antibiotics(cefazolin) were administered. A preoperative time-out was performed.   A circumferential incision was made approximately 1 cm proximal to the glans.  An additional circumferential incision was made more proximally around the foreskin.  A hemostat was used to connect these planes, and the foreskin was incised.  We then dissected the foreskin free from the underlying fascia.  Meticulous hemostasis was achieved.  At the 4 quadrants, we used interrupted 2-0 Vicryl mattress sutures to anchor the penile skin.  Interrupted 3-0 chromics were used in between the quadrant stitches.  A dorsal penile block was performed with 10 cc of quarter percent Marcaine.  Vaseline gauze was placed around the suture line, then wrapped in gauze, and Coban and  snugly.  Disposition: Stable to PACU  Plan: Remove dressing at home tomorrow evening Resume aspirin and Plavix in 5 days Follow-up in clinic in 6 weeks for wound check  Nickolas Madrid, MD 03/07/2018

## 2018-03-07 NOTE — Telephone Encounter (Signed)
App made and gave to nurse Jenny Reichmann on the floor  [place]

## 2018-03-07 NOTE — Consult Note (Addendum)
Patient underwent a circumcision this afternoon.  Postoperatively, he had some bleeding at the skin and another stitch was placed which controlled the bleeding.  The patient presented today back to the emergency department with significant penile swelling and scrotal swelling.  I was asked to assess.  On inspection, the patient has significant subcutaneous hematoma along the entire penile shaft.  The incision is intact.  There is no active bleeding.  There is no evidence of necrosis.  The scrotum also has some edema as well.  This is all consistent with postoperative hematoma secondary to the circumcision.  Recommend scrotal support and elevation.  He may use ice pack to minutes on and 20 minutes off for the next day or 2.  Also recommend that he is observed to ensure he is able to void given the tightness around the urethra.  If unable to void, recommend Foley catheter placement.  I will notify Dr. Diamantina Providence of his emergency room visit and recommend that he be evaluated in clinic next week to ensure he is coming along okay.
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
## 2018-03-07 NOTE — Telephone Encounter (Signed)
-----   Message from Billey Co, MD sent at 03/07/2018 12:56 PM EDT ----- Regarding: follow up Please schedule follow up in clinic with me in 6-8 weeks.  Nickolas Madrid, MD 03/07/2018

## 2018-03-07 NOTE — ED Notes (Signed)
Pt reports he has not void since this morning

## 2018-03-07 NOTE — Anesthesia Procedure Notes (Signed)
Procedure Name: LMA Insertion Date/Time: 03/07/2018 11:50 AM Performed by: Launa Grill, RN Pre-anesthesia Checklist: Patient identified, Emergency Drugs available, Suction available, Patient being monitored and Timeout performed Patient Re-evaluated:Patient Re-evaluated prior to induction Oxygen Delivery Method: Circle system utilized Preoxygenation: Pre-oxygenation with 100% oxygen Induction Type: IV induction Ventilation: Mask ventilation without difficulty LMA: LMA inserted LMA Size: 4.5 Number of attempts: 1 Placement Confirmation: positive ETCO2 and breath sounds checked- equal and bilateral Tube secured with: Tape Dental Injury: Teeth and Oropharynx as per pre-operative assessment

## 2018-03-07 NOTE — Anesthesia Post-op Follow-up Note (Signed)
Anesthesia QCDR form completed.        

## 2018-03-09 NOTE — Anesthesia Postprocedure Evaluation (Signed)
Anesthesia Post Note  Patient: James Maxwell  Procedure(s) Performed: CIRCUMCISION ADULT (N/A Penis)  Patient location during evaluation: PACU Anesthesia Type: General Level of consciousness: awake and alert Pain management: pain level controlled Vital Signs Assessment: post-procedure vital signs reviewed and stable Respiratory status: spontaneous breathing, nonlabored ventilation and respiratory function stable Cardiovascular status: blood pressure returned to baseline and stable Postop Assessment: no apparent nausea or vomiting Anesthetic complications: no     Last Vitals:  Vitals:   03/07/18 1423 03/07/18 1554  BP: (!) 150/83 (!) 165/79  Pulse: 60 (!) 59  Resp: 18 18  Temp:    SpO2: 98% 98%    Last Pain:  Vitals:   03/07/18 1554  TempSrc:   PainSc: [place]

## 2018-03-10 ENCOUNTER — Telehealth: Payer: Self-pay | Admitting: Family Medicine

## 2018-03-10 ENCOUNTER — Encounter: Payer: Self-pay | Admitting: Urology

## 2018-03-10 NOTE — Telephone Encounter (Signed)
Spoke to patient and he was seen in ER and has a Foley. He is scheduled to see Larene Beach on Thursday this week. He is to call our office if he has any other problems.

## 2018-03-13 ENCOUNTER — Encounter: Payer: Self-pay | Admitting: Urology

## 2018-03-13 ENCOUNTER — Ambulatory Visit (INDEPENDENT_AMBULATORY_CARE_PROVIDER_SITE_OTHER): Payer: Medicare HMO | Admitting: Urology

## 2018-03-13 VITALS — BP 161/73 | HR 90 | Ht 69.0 in | Wt 263.0 lb

## 2018-03-13 DIAGNOSIS — R338 Other retention of urine: Secondary | ICD-10-CM | POA: Diagnosis not present

## 2018-03-13 DIAGNOSIS — T819XXD Unspecified complication of procedure, subsequent encounter: Secondary | ICD-10-CM | POA: Diagnosis not present
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
## 2018-03-14 ENCOUNTER — Other Ambulatory Visit: Payer: Self-pay

## 2018-03-14 ENCOUNTER — Encounter: Payer: Self-pay | Admitting: Urology

## 2018-03-14 ENCOUNTER — Ambulatory Visit (INDEPENDENT_AMBULATORY_CARE_PROVIDER_SITE_OTHER): Payer: Medicare HMO | Admitting: Urology

## 2018-03-14 VITALS — BP 150/90 | HR 66

## 2018-03-14 DIAGNOSIS — T819XXD Unspecified complication of procedure, subsequent encounter: Secondary | ICD-10-CM
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
## 2018-03-17 ENCOUNTER — Telehealth: Payer: Self-pay | Admitting: Urology

## 2018-03-17 NOTE — Telephone Encounter (Signed)
Would you check with Mr. James Maxwell and see how he is doing today?

## 2018-03-17 NOTE — Telephone Encounter (Signed)
Spoke to patient and he is doing better. He states he still has some blood spots now and then in his underwear but he is doing much better.

## 2018-03-20 ENCOUNTER — Ambulatory Visit (INDEPENDENT_AMBULATORY_CARE_PROVIDER_SITE_OTHER): Payer: Medicare HMO | Admitting: Urology

## 2018-03-20 ENCOUNTER — Encounter: Payer: Self-pay | Admitting: Urology

## 2018-03-20 VITALS — BP 166/78 | HR 76

## 2018-03-20 DIAGNOSIS — T148XXA Other injury of unspecified body region, initial encounter: Secondary | ICD-10-CM

## 2018-03-20 DIAGNOSIS — L24A9 Irritant contact dermatitis due friction or contact with other specified body fluids: Secondary | ICD-10-CM

## 2018-03-20 MED ORDER — CEPHALEXIN 500 MG PO CAPS: 500.0000 mg | ORAL_CAPSULE | Freq: Two times a day (BID) | ORAL | 0 refills | Status: DC

## 2018-03-20 NOTE — Progress Notes (Signed)
   03/20/2018 5:05 PM   James Maxwell [birth year] [id number]  Reason for visit: Follow up wound check after circumcision  I saw [name] back in urology clinic today for concern for wound check after circumcision.  He is a 67 year old obese male with multiple comorbidities including history of CAD and stroke on Plavix who underwent circumcision 00/92/3300 complicated by postoperative hematoma.  He required a Foley catheter briefly after surgery for inability to void.  He is now voiding on his own.  He presented to clinic today with some bleeding from the inferior aspect of his incision.  On exam there is a 3 to 4 cm inferior hematoma.  This appears to be liquefying and oozing from between the stitches on the inferior aspect of the circumcision incision.  I was able to express approximately 30 cc of old hematoma through the incision which decompressed nicely.  There was a residual 1 cm opening on the inferior aspect of the incision which was then packed with half-inch gauze strip.  There was no bright red or active bleeding.  The glans was pink and healthy-appearing.  There is no purulence or signs of infection.  -His wife will pack the incision 1-2 times daily to allow for healing by secondary intention -Prophylactic Keflex twice daily to prevent infection -Follow-up in 10 days for repeat wound check -Continue to hold Plavix -Call clinic if any concerns or new bleeding  Please note 10 minutes were spent with the patient, greater than 50% were spent in direct patient education and counseling regarding wound care and postop instructions.  Billey Co, [facility] [address] [place], [place] [address] [phone]

## 2018-03-31 ENCOUNTER — Ambulatory Visit (INDEPENDENT_AMBULATORY_CARE_PROVIDER_SITE_OTHER): Payer: Medicare HMO | Admitting: Urology

## 2018-03-31 ENCOUNTER — Encounter: Payer: Self-pay | Admitting: Urology

## 2018-03-31 VITALS — BP 148/70 | HR 76 | Ht 69.0 in | Wt 262.3 lb

## 2018-03-31 DIAGNOSIS — Z4889 Encounter for other specified surgical aftercare: Secondary | ICD-10-CM | POA: Diagnosis not present

## 2018-03-31 MED ORDER — CEPHALEXIN 500 MG PO CAPS: 500.0000 mg | ORAL_CAPSULE | Freq: Two times a day (BID) | ORAL | 0 refills | Status: DC

## 2018-03-31 NOTE — Progress Notes (Signed)
UROLOGY PROGRESS NOTE  Reason for visit: Follow up wound check after circumcision  I saw James Maxwell back in urology clinic today for wound check after circumcision.  He is a 67 year old obese male with multiple comorbidities including history of CAD and stroke on Plavix who underwent circumcision 59/45/8592 complicated by postoperative hematoma.  He required a Foley catheter briefly after surgery for inability to void.  He is now voiding on his own.  His wife was supposed to pack his incision twice daily with quarter inch gauze, however it sounds like she has stopped doing this over the last few days.  He denies any fevers, chills, or penile pain.  On exam, there is no clinical signs of infection.  There is no erythema, drainage, or purulence.  1/2 inch area remains open between the glans and penile skin at the inferior aspect of the right shaft.  This is healed significantly since I saw him previously.  However, there is still an approximately 1 inch deep area that requires packing.  -His wife will pack the incision 2 times daily to allow for healing by secondary intention, I instructed her to do this until she could no longer pack the wound, and to call us if she felt like it had completely healed for a repeat wound check -Continue prophylactic Keflex twice daily to prevent infection -Follow-up in 2 to 3 weeks for repeat wound check -Call clinic if any fevers, concerns, or bleeding  Please note 10 minutes were spent with the patient, greater than 50% were spent in direct patient education and counseling regarding wound care and postop instructions.  Nickolas Madrid, MD 03/31/2018

## 2018-04-21 ENCOUNTER — Ambulatory Visit: Payer: Medicare HMO | Admitting: Urology

## 2018-04-22 ENCOUNTER — Other Ambulatory Visit: Payer: Self-pay

## 2018-04-22 ENCOUNTER — Ambulatory Visit (INDEPENDENT_AMBULATORY_CARE_PROVIDER_SITE_OTHER): Payer: Medicare HMO | Admitting: Urology

## 2018-04-22 ENCOUNTER — Encounter: Payer: Self-pay | Admitting: Urology

## 2018-04-22 VITALS — BP 127/72 | HR 73

## 2018-04-22 DIAGNOSIS — Z5189 Encounter for other specified aftercare: Secondary | ICD-10-CM

## 2018-04-22 NOTE — Progress Notes (Signed)
UROLOGY PROGRESS NOTE  Reason for visit: Follow upwound check after circumcision  I saw James Maxwell back in urology clinic today for wound check after circumcision. He is a 67 year old obese male with multiple comorbidities including history of CAD and stroke on Plavix who underwent circumcision 20/60/1561 complicated by postoperative hematoma. He required a Foley catheter briefly after surgery for inability to void.  His wife has been packing the incision BID with strips, but is having trouble packing it the last few days secondary to decrease in size.  He denies any fevers, chills, or penile pain.  On exam, there are no clinical signs of infection.  There is no erythema, drainage, or purulence.  0.5cm area remains open between the glans and penile skin at the inferior aspect of the right shaft, this appears to be no more than a few mm deep. I am unable to probe or pack this area.  This has healed significantly since I saw him previously.    -OK to stop abx, OK to resume blood thinners -Call clinic if any fevers, concerns, or bleeding -Follow up as needed  Please note 10 minutes were spent with the patient, greater than 50% were spent in direct patient education and counseling regarding wound care and postop instructions.   Nickolas Madrid, MD 04/22/2018

## 2018-06-21 ENCOUNTER — Emergency Department
Admission: EM | Admit: 2018-06-21 | Discharge: 2018-06-21 | Disposition: A | Discharge status: Home / Self Care | Payer: Medicare HMO | Attending: Student in an Organized Health Care Education/Training Program | Admitting: Student in an Organized Health Care Education/Training Program

## 2018-06-21 ENCOUNTER — Emergency Department: Payer: Medicare HMO

## 2018-06-21 ENCOUNTER — Other Ambulatory Visit: Payer: Self-pay

## 2018-06-21 ENCOUNTER — Encounter: Payer: Self-pay | Admitting: Emergency Medicine

## 2018-06-21 DIAGNOSIS — R05 Cough: Secondary | ICD-10-CM | POA: Diagnosis present

## 2018-06-21 DIAGNOSIS — I252 Old myocardial infarction: Secondary | ICD-10-CM | POA: Insufficient documentation

## 2018-06-21 DIAGNOSIS — Z87891 Personal history of nicotine dependence: Secondary | ICD-10-CM | POA: Diagnosis not present

## 2018-06-21 DIAGNOSIS — Z79899 Other long term (current) drug therapy: Secondary | ICD-10-CM | POA: Insufficient documentation

## 2018-06-21 DIAGNOSIS — I2510 Atherosclerotic heart disease of native coronary artery without angina pectoris: Secondary | ICD-10-CM | POA: Insufficient documentation

## 2018-06-21 DIAGNOSIS — Z8673 Personal history of transient ischemic attack (TIA), and cerebral infarction without residual deficits: Secondary | ICD-10-CM | POA: Diagnosis not present

## 2018-06-21 DIAGNOSIS — E119 Type 2 diabetes mellitus without complications: Secondary | ICD-10-CM | POA: Insufficient documentation

## 2018-06-21 DIAGNOSIS — I110 Hypertensive heart disease with heart failure: Secondary | ICD-10-CM | POA: Insufficient documentation

## 2018-06-21 DIAGNOSIS — I5032 Chronic diastolic (congestive) heart failure: Secondary | ICD-10-CM | POA: Insufficient documentation

## 2018-06-21 DIAGNOSIS — Z96653 Presence of artificial knee joint, bilateral: Secondary | ICD-10-CM | POA: Diagnosis not present

## 2018-06-21 DIAGNOSIS — J449 Chronic obstructive pulmonary disease, unspecified: Secondary | ICD-10-CM | POA: Diagnosis not present

## 2018-06-21 DIAGNOSIS — J189 Pneumonia, unspecified organism: Secondary | ICD-10-CM | POA: Insufficient documentation

## 2018-06-21 DIAGNOSIS — J181 Lobar pneumonia, unspecified organism: Secondary | ICD-10-CM

## 2018-06-21 DIAGNOSIS — Z794 Long term (current) use of insulin: Secondary | ICD-10-CM | POA: Diagnosis not present

## 2018-06-21 MED ORDER — DOXYCYCLINE HYCLATE 100 MG PO CAPS: 100.0000 mg | ORAL_CAPSULE | Freq: Two times a day (BID) | ORAL | 0 refills | Status: AC

## 2018-06-21 MED ORDER — AMOXICILLIN-POT CLAVULANATE ER 1000-62.5 MG PO TB12: 1.0000 | ORAL_TABLET | Freq: Two times a day (BID) | ORAL | 0 refills | Status: AC

## 2018-06-21 MED ORDER — GUAIFENESIN-CODEINE 100-10 MG/5ML PO SOLN: 5.0000 mL | Freq: Four times a day (QID) | ORAL | 0 refills | Status: DC | PRN

## 2018-06-21 MED ORDER — IPRATROPIUM-ALBUTEROL 0.5-2.5 (3) MG/3ML IN SOLN
3.0000 mL | Freq: Once | RESPIRATORY_TRACT | Status: AC
  Administered 2018-06-21: 3 mL via RESPIRATORY_TRACT
  Filled 2018-06-21: qty 3

## 2018-06-21 NOTE — Discharge Instructions (Addendum)
Call make a follow-up appointment with your primary care provider.  Begin taking antibiotics as directed.  A prescription for both Augmentin and doxycycline was sent to your pharmacy.  Also guaifenesin with codeine for cough.  Continue to stay hydrated.  You may take Tylenol as needed for body aches or fever.  Return to the emergency department if any severe worsening of your symptoms.
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
## 2018-06-21 NOTE — ED Triage Notes (Signed)
Cough and body aches x 2 days.  

## 2018-07-17 ENCOUNTER — Emergency Department: Payer: Medicare HMO

## 2018-07-17 DIAGNOSIS — E119 Type 2 diabetes mellitus without complications: Secondary | ICD-10-CM | POA: Insufficient documentation

## 2018-07-17 DIAGNOSIS — I110 Hypertensive heart disease with heart failure: Secondary | ICD-10-CM | POA: Diagnosis not present

## 2018-07-17 DIAGNOSIS — I2510 Atherosclerotic heart disease of native coronary artery without angina pectoris: Secondary | ICD-10-CM | POA: Diagnosis not present

## 2018-07-17 DIAGNOSIS — Z79899 Other long term (current) drug therapy: Secondary | ICD-10-CM | POA: Diagnosis not present

## 2018-07-17 DIAGNOSIS — R5383 Other fatigue: Secondary | ICD-10-CM | POA: Insufficient documentation

## 2018-07-17 DIAGNOSIS — J449 Chronic obstructive pulmonary disease, unspecified: Secondary | ICD-10-CM | POA: Insufficient documentation

## 2018-07-17 DIAGNOSIS — R0609 Other forms of dyspnea: Secondary | ICD-10-CM | POA: Insufficient documentation

## 2018-07-17 DIAGNOSIS — Z955 Presence of coronary angioplasty implant and graft: Secondary | ICD-10-CM | POA: Diagnosis not present

## 2018-07-17 DIAGNOSIS — Z96653 Presence of artificial knee joint, bilateral: Secondary | ICD-10-CM | POA: Diagnosis not present

## 2018-07-17 DIAGNOSIS — I5032 Chronic diastolic (congestive) heart failure: Secondary | ICD-10-CM | POA: Insufficient documentation

## 2018-07-17 DIAGNOSIS — Z87891 Personal history of nicotine dependence: Secondary | ICD-10-CM | POA: Diagnosis not present

## 2018-07-17 DIAGNOSIS — Z794 Long term (current) use of insulin: Secondary | ICD-10-CM | POA: Diagnosis not present

## 2018-07-17 DIAGNOSIS — R0602 Shortness of breath: Secondary | ICD-10-CM | POA: Diagnosis present

## 2018-07-17 LAB — CBC
HCT: 41.2 % (ref 39.0–52.0)
Hemoglobin: 13.5 g/dL (ref 13.0–17.0)
MCH: 28 pg (ref 26.0–34.0)
MCHC: 32.8 g/dL (ref 30.0–36.0)
MCV: 85.5 fL (ref 80.0–100.0)
NRBC: 0 % (ref 0.0–0.2)
PLATELETS: 193 10*3/uL (ref 150–400)
RBC: 4.82 MIL/uL (ref 4.22–5.81)
RDW: 14.7 % (ref 11.5–15.5)
WBC: 8 10*3/uL (ref 4.0–10.5)

## 2018-07-17 LAB — BASIC METABOLIC PANEL
Anion gap: 10 (ref 5–15)
BUN: 10 mg/dL (ref 8–23)
CALCIUM: 8.7 mg/dL — AB (ref 8.9–10.3)
CO2: 24 mmol/L (ref 22–32)
Chloride: 107 mmol/L (ref 98–111)
Creatinine, Ser: 1.12 mg/dL (ref 0.61–1.24)
GFR calc non Af Amer: >60 mL/min (ref >60)
Glucose, Bld: 138 mg/dL — ABNORMAL HIGH (ref 70–99)
Potassium: 3.5 mmol/L (ref 3.5–5.1)
SODIUM: 141 mmol/L (ref 135–145)

## 2018-07-17 LAB — TROPONIN I: Troponin I: <0.03 ng/mL (ref <0.03)

## 2018-07-17 NOTE — ED Triage Notes (Signed)
Patient c/o SOB , medial chest pain. Patient reports hx of CHF and COPD.

## 2018-07-18 ENCOUNTER — Emergency Department
Admission: EM | Admit: 2018-07-18 | Discharge: 2018-07-18 | Disposition: A | Discharge status: Home / Self Care | Payer: Medicare HMO | Attending: Emergency Medicine | Admitting: Emergency Medicine

## 2018-07-18 DIAGNOSIS — R5383 Other fatigue: Secondary | ICD-10-CM

## 2018-07-18 DIAGNOSIS — R0609 Other forms of dyspnea: Secondary | ICD-10-CM

## 2018-07-18 LAB — BRAIN NATRIURETIC PEPTIDE: B NATRIURETIC PEPTIDE 5: 79 pg/mL (ref 0.0–100.0)
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
## 2018-07-18 NOTE — Discharge Instructions (Signed)
Your workup in the Emergency Department today was reassuring.  We did not find any specific abnormalities.  We recommend you drink plenty of fluids, take your regular medications and/or any new ones prescribed today, and follow up with the doctor(s) listed in these documents as recommended.  Return to the Emergency Department if you develop new or worsening symptoms that concern you.  

## 2018-10-27 IMAGING — CR DG FINGER THUMB 2+V*L*
3 series · 3 of 3 positions shown · non-contrast
Comparison: None.

CLINICAL DATA: Acute onset of left thumb pain. Assess for
infection.

EXAM:
LEFT THUMB 2+V

[finger ap]
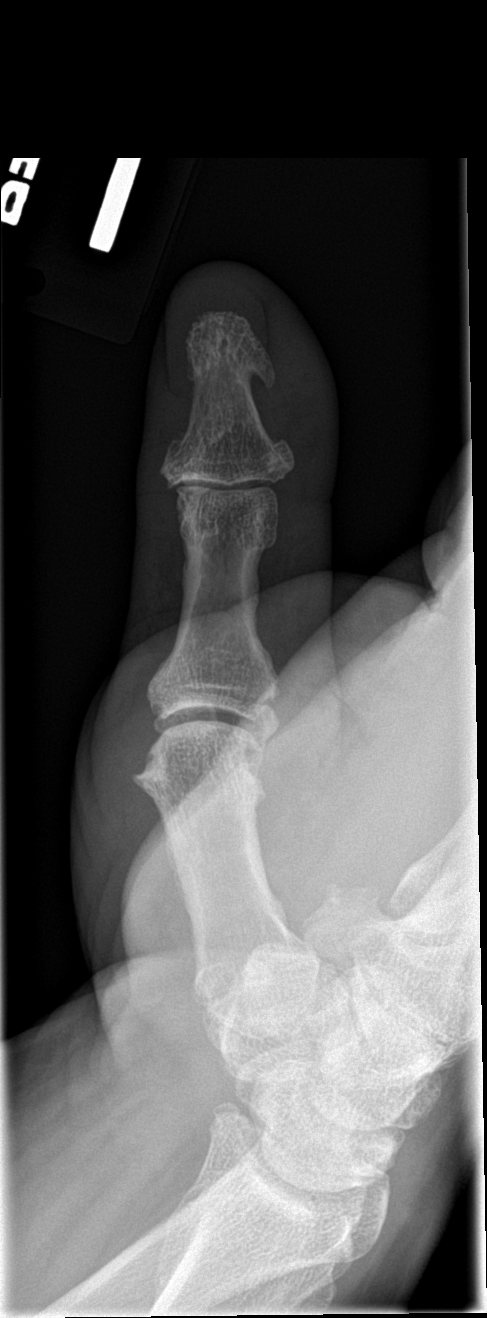

[finger obl]
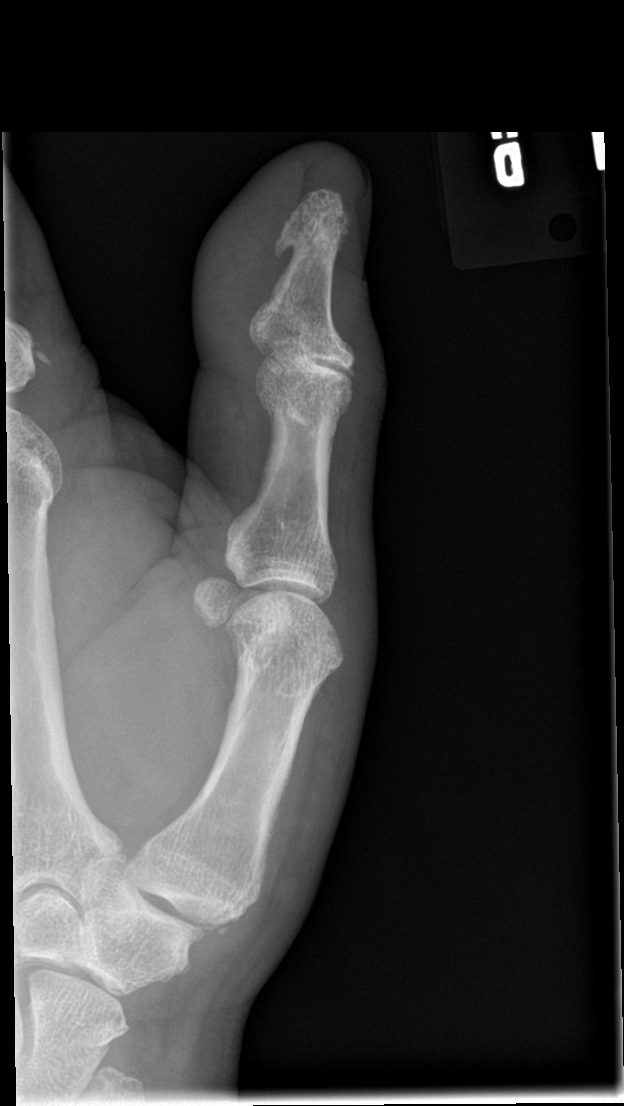

[finger lat]
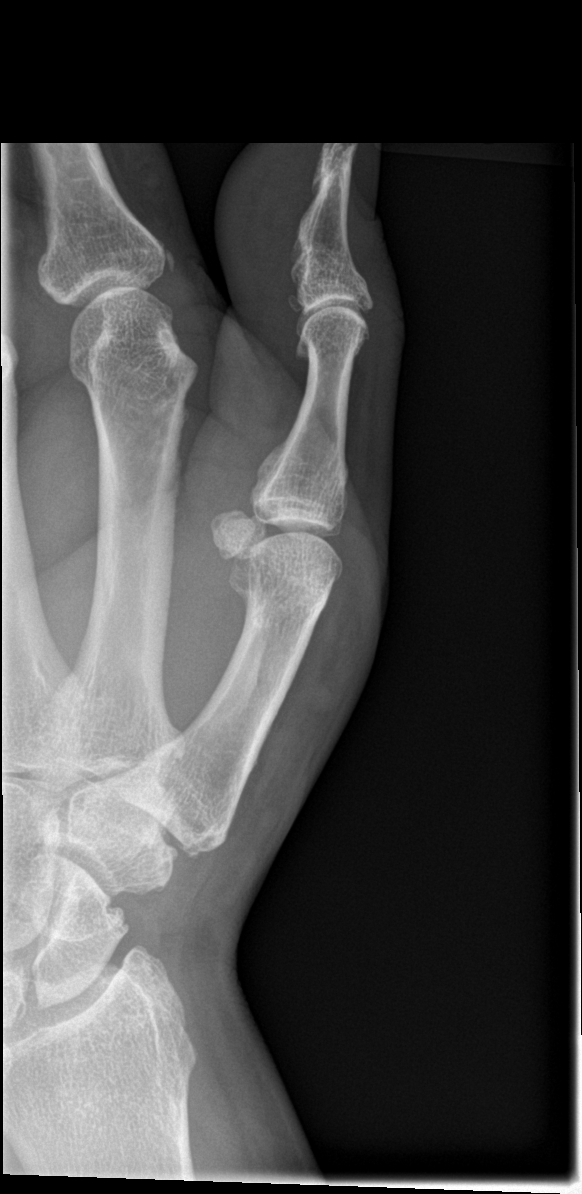

[3 of 3 positions shown; findings below may reference images not displayed]

FINDINGS: There is no evidence of fracture or dislocation. No osseous erosions
are seen to suggest osteomyelitis. Visualized joint spaces are
preserved. Mild soft tissue swelling is noted about the thumb. No
radiopaque foreign bodies are seen. No soft tissue air is
identified.
IMPRESSION: No evidence of fracture or dislocation. No osseous erosions seen. No
radiopaque foreign bodies identified.

## 2018-11-29 IMAGING — US US CAROTID DUPLEX BILAT
1 series · 13 of 24 positions shown · non-contrast
Comparison: None.

CLINICAL DATA: Diplopia

EXAM:
BILATERAL CAROTID DUPLEX ULTRASOUND
TECHNIQUE: Gray scale imaging, color Doppler and duplex ultrasound were
performed of bilateral carotid and vertebral arteries in the neck.

[Series 1: us carotid duplex bilat · 0.06mm/px · 13 of 70 slices shown]
[im 1/70]
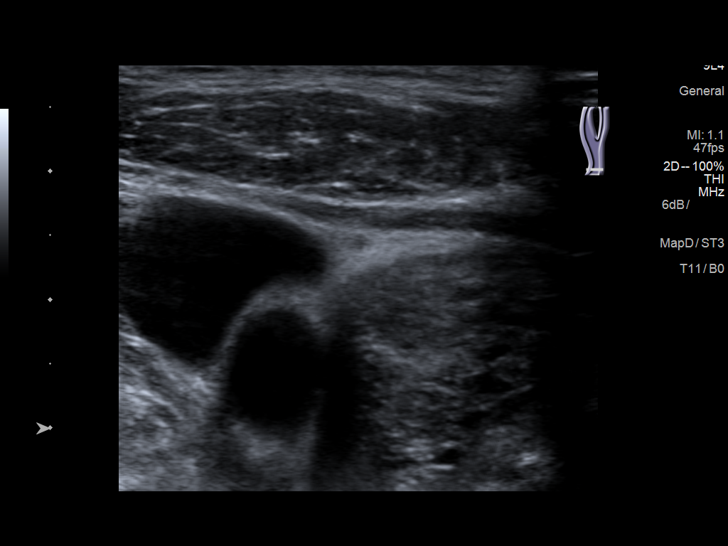
[im 7/70]
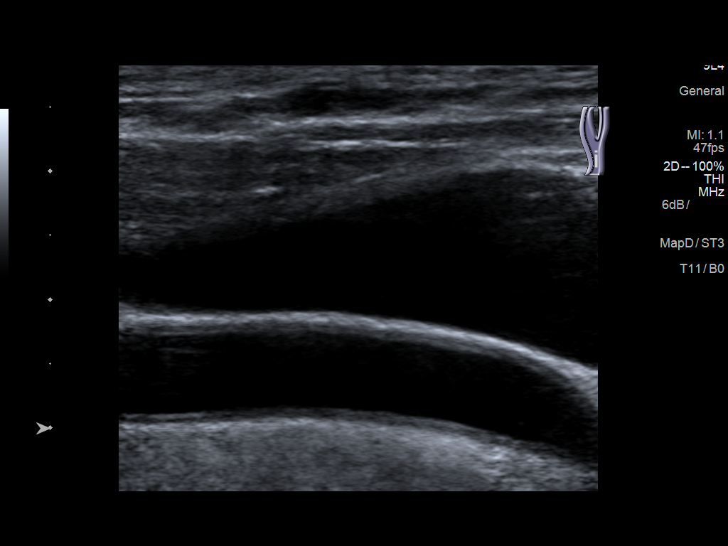
[im 13/70]
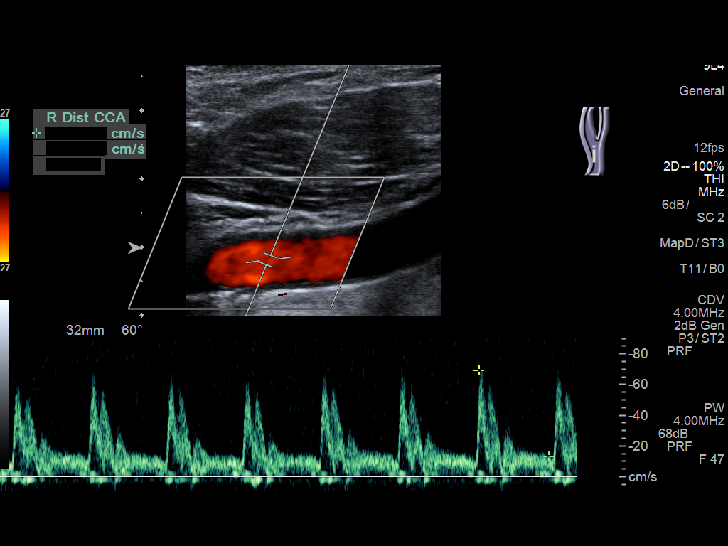
[im 19/70]
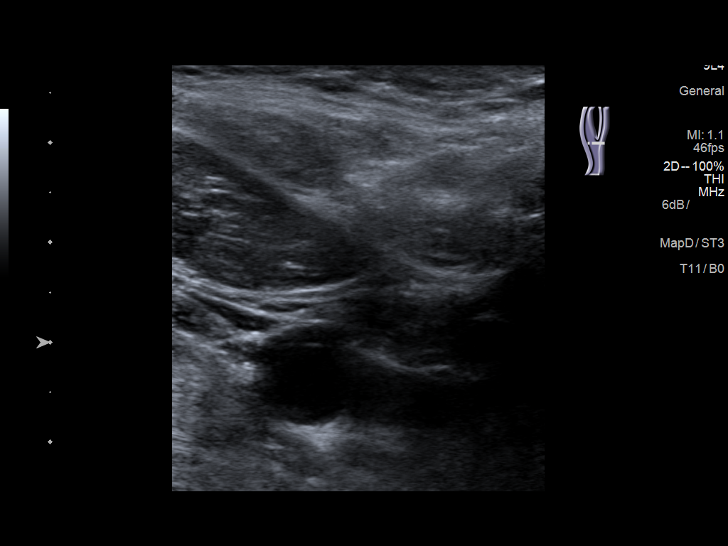
[im 25/70]
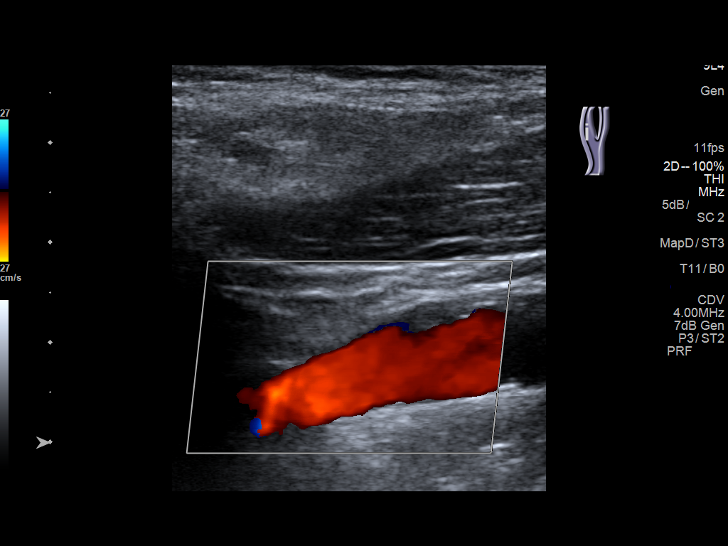
[im 31/70]
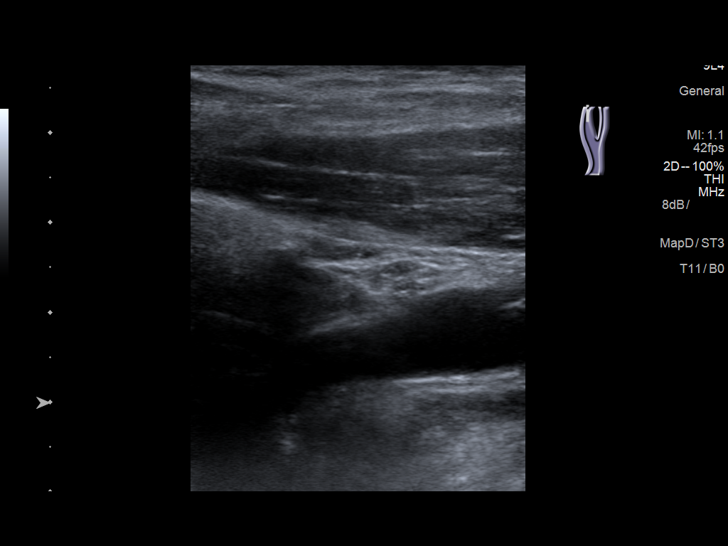
[im 37/70]
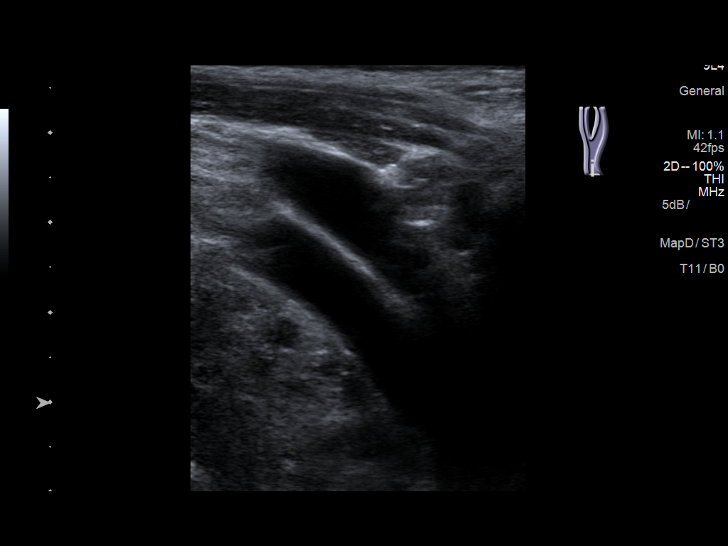
[im 40/70]
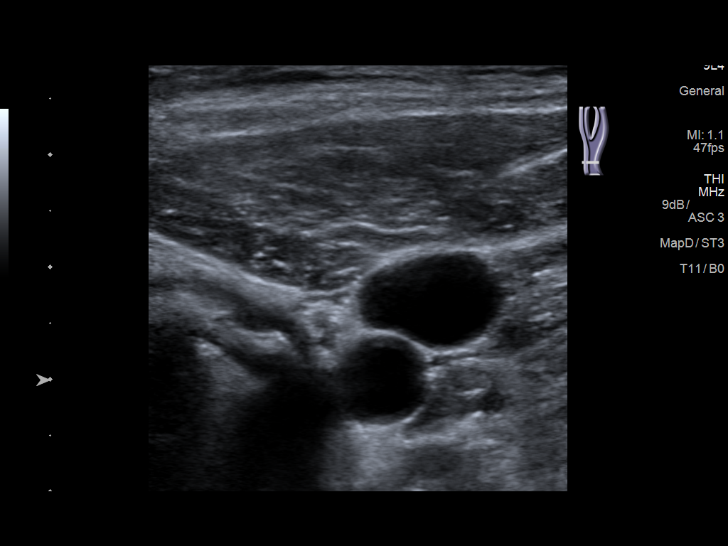
[im 46/70]
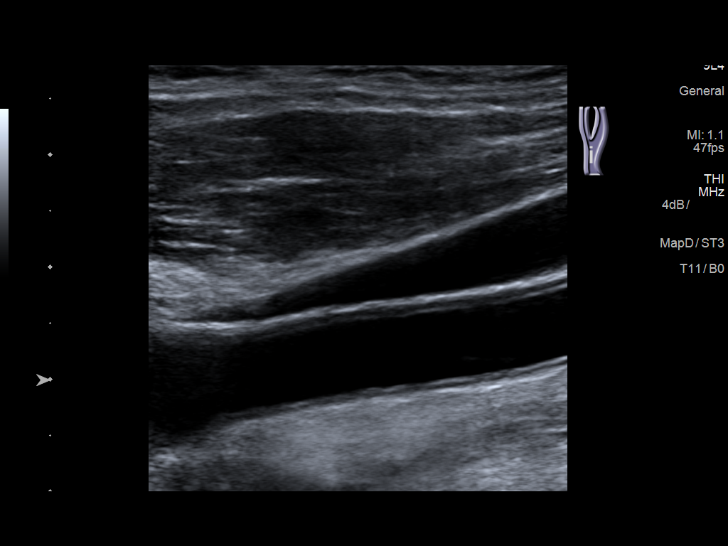
[im 52/70]
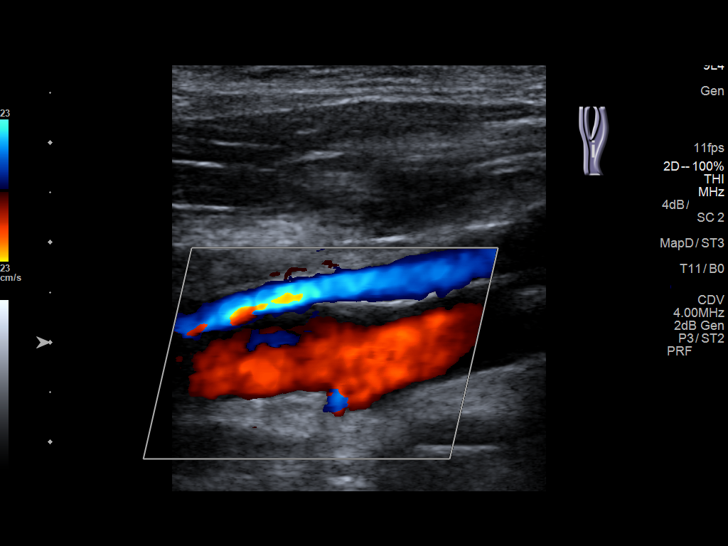
[im 58/70]
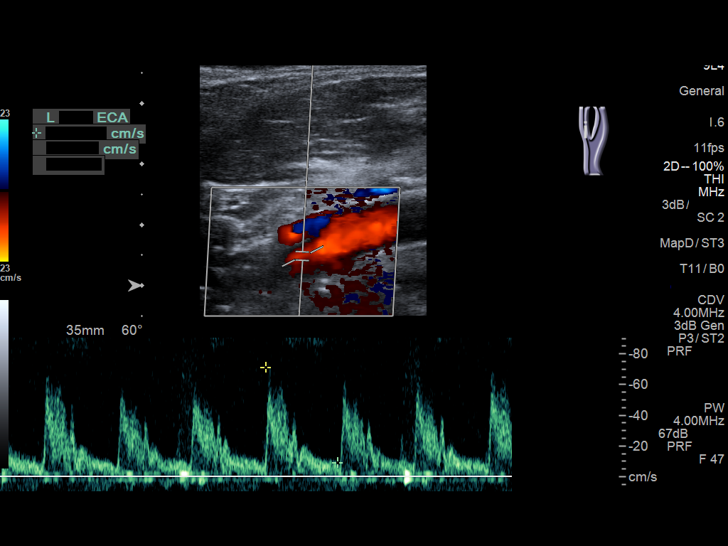
[im 64/70]
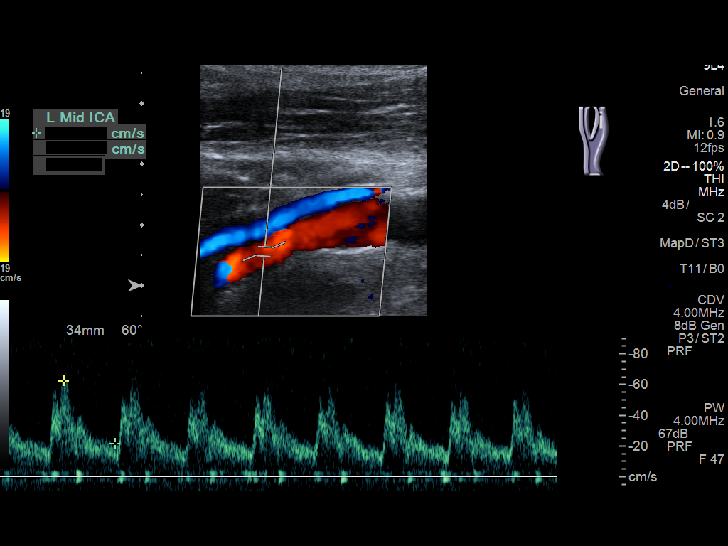
[im 70/70]
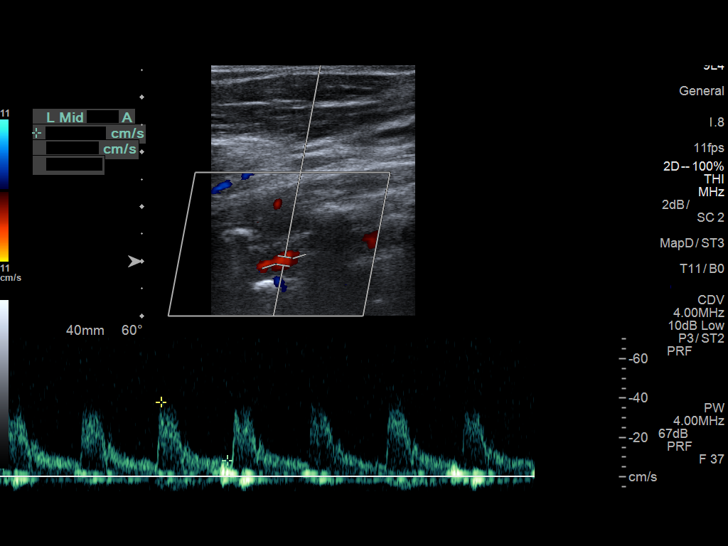

[13 of 24 positions shown; findings below may reference images not displayed]

FINDINGS: Criteria: Quantification of carotid stenosis is based on velocity
parameters that correlate the residual internal carotid diameter
with NASCET-based stenosis levels, using the diameter of the distal
internal carotid lumen as the denominator for stenosis measurement.

The following velocity measurements were obtained:

RIGHT

ICA:  68 cm/sec

CCA:  119 cm/sec

SYSTOLIC ICA/CCA RATIO:

DIASTOLIC ICA/CCA RATIO:

ECA:  112 cm/sec

LEFT

ICA:  63 cm/sec

CCA:  105 cm/sec

SYSTOLIC ICA/CCA RATIO:

DIASTOLIC ICA/CCA RATIO:

ECA:  71 cm/sec

RIGHT CAROTID ARTERY: Minimal-to-mild calcified and noncalcified
plaque at the right carotid bulb and within the proximal right
internal carotid artery without evidence of hemodynamically
significant stenosis.

RIGHT VERTEBRAL ARTERY:  Normal antegrade flow.

LEFT CAROTID ARTERY:  No plaque identified.

LEFT VERTEBRAL ARTERY:  Normal antegrade flow.

Sonographer indicates that irregular heartbeats were seen.
IMPRESSION: 1. Minimal to mild calcified and noncalcified plaque at the right
carotid bulb and within the proximal right internal carotid artery.
No plaque demonstrated within the left carotid arteries.
2. No evidence of hemodynamically significant stenosis within the
bilateral common carotid or internal carotid arteries.
3. Sonographer indicates that irregular heartbeats were noted.

## 2019-01-23 ENCOUNTER — Other Ambulatory Visit: Payer: Self-pay

## 2019-01-23 ENCOUNTER — Emergency Department
Admission: EM | Admit: 2019-01-23 | Discharge: 2019-01-23 | Disposition: A | Discharge status: Home / Self Care | Payer: Medicare Other | Attending: Emergency Medicine | Admitting: Emergency Medicine

## 2019-01-23 ENCOUNTER — Encounter: Payer: Self-pay | Admitting: Emergency Medicine

## 2019-01-23 ENCOUNTER — Emergency Department: Payer: Medicare Other

## 2019-01-23 ENCOUNTER — Encounter (HOSPITAL_COMMUNITY): Payer: Self-pay

## 2019-01-23 ENCOUNTER — Emergency Department (HOSPITAL_COMMUNITY): Payer: Medicare Other

## 2019-01-23 ENCOUNTER — Inpatient Hospital Stay (HOSPITAL_COMMUNITY)
Admission: EM | Admit: 2019-01-23 | Discharge: 2019-01-27 | DRG: 287 | Disposition: A | Discharge status: Home / Self Care | Payer: Medicare Other | Attending: Internal Medicine | Admitting: Internal Medicine

## 2019-01-23 DIAGNOSIS — I493 Ventricular premature depolarization: Secondary | ICD-10-CM | POA: Diagnosis present

## 2019-01-23 DIAGNOSIS — I252 Old myocardial infarction: Secondary | ICD-10-CM

## 2019-01-23 DIAGNOSIS — G4733 Obstructive sleep apnea (adult) (pediatric): Secondary | ICD-10-CM | POA: Diagnosis present

## 2019-01-23 DIAGNOSIS — J449 Chronic obstructive pulmonary disease, unspecified: Secondary | ICD-10-CM | POA: Diagnosis present

## 2019-01-23 DIAGNOSIS — Z5321 Procedure and treatment not carried out due to patient leaving prior to being seen by health care provider: Secondary | ICD-10-CM | POA: Diagnosis not present

## 2019-01-23 DIAGNOSIS — Z955 Presence of coronary angioplasty implant and graft: Secondary | ICD-10-CM

## 2019-01-23 DIAGNOSIS — Z9841 Cataract extraction status, right eye: Secondary | ICD-10-CM

## 2019-01-23 DIAGNOSIS — Z8673 Personal history of transient ischemic attack (TIA), and cerebral infarction without residual deficits: Secondary | ICD-10-CM

## 2019-01-23 DIAGNOSIS — I2510 Atherosclerotic heart disease of native coronary artery without angina pectoris: Secondary | ICD-10-CM | POA: Diagnosis present

## 2019-01-23 DIAGNOSIS — I25110 Atherosclerotic heart disease of native coronary artery with unstable angina pectoris: Principal | ICD-10-CM | POA: Diagnosis present

## 2019-01-23 DIAGNOSIS — I110 Hypertensive heart disease with heart failure: Secondary | ICD-10-CM | POA: Diagnosis present

## 2019-01-23 DIAGNOSIS — Z794 Long term (current) use of insulin: Secondary | ICD-10-CM

## 2019-01-23 DIAGNOSIS — E669 Obesity, unspecified: Secondary | ICD-10-CM | POA: Diagnosis present

## 2019-01-23 DIAGNOSIS — I5032 Chronic diastolic (congestive) heart failure: Secondary | ICD-10-CM | POA: Diagnosis present

## 2019-01-23 DIAGNOSIS — Z96653 Presence of artificial knee joint, bilateral: Secondary | ICD-10-CM | POA: Diagnosis present

## 2019-01-23 DIAGNOSIS — Z79899 Other long term (current) drug therapy: Secondary | ICD-10-CM

## 2019-01-23 DIAGNOSIS — Z9842 Cataract extraction status, left eye: Secondary | ICD-10-CM

## 2019-01-23 DIAGNOSIS — Z20828 Contact with and (suspected) exposure to other viral communicable diseases: Secondary | ICD-10-CM | POA: Diagnosis present

## 2019-01-23 DIAGNOSIS — I249 Acute ischemic heart disease, unspecified: Secondary | ICD-10-CM | POA: Diagnosis present

## 2019-01-23 DIAGNOSIS — I200 Unstable angina: Secondary | ICD-10-CM | POA: Diagnosis not present

## 2019-01-23 DIAGNOSIS — E1159 Type 2 diabetes mellitus with other circulatory complications: Secondary | ICD-10-CM | POA: Diagnosis present

## 2019-01-23 DIAGNOSIS — R079 Chest pain, unspecified: Secondary | ICD-10-CM | POA: Insufficient documentation

## 2019-01-23 DIAGNOSIS — Z6841 Body Mass Index (BMI) 40.0 and over, adult: Secondary | ICD-10-CM

## 2019-01-23 DIAGNOSIS — I10 Essential (primary) hypertension: Secondary | ICD-10-CM | POA: Diagnosis present

## 2019-01-23 DIAGNOSIS — Z888 Allergy status to other drugs, medicaments and biological substances status: Secondary | ICD-10-CM

## 2019-01-23 DIAGNOSIS — Z8249 Family history of ischemic heart disease and other diseases of the circulatory system: Secondary | ICD-10-CM

## 2019-01-23 DIAGNOSIS — Z7902 Long term (current) use of antithrombotics/antiplatelets: Secondary | ICD-10-CM

## 2019-01-23 DIAGNOSIS — E785 Hyperlipidemia, unspecified: Secondary | ICD-10-CM | POA: Diagnosis present

## 2019-01-23 DIAGNOSIS — Z87891 Personal history of nicotine dependence: Secondary | ICD-10-CM

## 2019-01-23 DIAGNOSIS — E1169 Type 2 diabetes mellitus with other specified complication: Secondary | ICD-10-CM

## 2019-01-23 DIAGNOSIS — Z7982 Long term (current) use of aspirin: Secondary | ICD-10-CM

## 2019-01-23 DIAGNOSIS — K219 Gastro-esophageal reflux disease without esophagitis: Secondary | ICD-10-CM | POA: Diagnosis present

## 2019-01-23 DIAGNOSIS — M1990 Unspecified osteoarthritis, unspecified site: Secondary | ICD-10-CM | POA: Diagnosis present

## 2019-01-23 LAB — CBC
HCT: 42.7 % (ref 39.0–52.0)
HCT: 43.4 % (ref 39.0–52.0)
Hemoglobin: 13.8 g/dL (ref 13.0–17.0)
Hemoglobin: 14 g/dL (ref 13.0–17.0)
MCH: 27.4 pg (ref 26.0–34.0)
MCH: 28.1 pg (ref 26.0–34.0)
MCHC: 32.3 g/dL (ref 30.0–36.0)
MCHC: 32.3 g/dL (ref 30.0–36.0)
MCV: 84.7 fL (ref 80.0–100.0)
MCV: 87.1 fL (ref 80.0–100.0)
Platelets: 241 10*3/uL (ref 150–400)
Platelets: 231 10*3/uL (ref 150–400)
RBC: 5.04 MIL/uL (ref 4.22–5.81)
RBC: 4.98 MIL/uL (ref 4.22–5.81)
RDW: 14.4 % (ref 11.5–15.5)
RDW: 14.5 % (ref 11.5–15.5)
WBC: 8.8 10*3/uL (ref 4.0–10.5)
WBC: 8.8 10*3/uL (ref 4.0–10.5)
nRBC: 0 % (ref 0.0–0.2)
nRBC: 0 % (ref 0.0–0.2)

## 2019-01-23 LAB — BASIC METABOLIC PANEL
Anion gap: 14 (ref 5–15)
Anion gap: 12 (ref 5–15)
BUN: 16 mg/dL (ref 8–23)
BUN: 17 mg/dL (ref 8–23)
CO2: 23 mmol/L (ref 22–32)
CO2: 23 mmol/L (ref 22–32)
Calcium: 9.1 mg/dL (ref 8.9–10.3)
Calcium: 9.1 mg/dL (ref 8.9–10.3)
Chloride: 102 mmol/L (ref 98–111)
Chloride: 105 mmol/L (ref 98–111)
Creatinine, Ser: 1.24 mg/dL (ref 0.61–1.24)
Creatinine, Ser: 1.17 mg/dL (ref 0.61–1.24)
GFR calc Af Amer: >60 mL/min (ref >60)
GFR calc Af Amer: >60 mL/min (ref >60)
GFR calc non Af Amer: 59 mL/min — ABNORMAL LOW (ref >60)
GFR calc non Af Amer: >60 mL/min (ref >60)
Glucose, Bld: 164 mg/dL — ABNORMAL HIGH (ref 70–99)
Glucose, Bld: 138 mg/dL — ABNORMAL HIGH (ref 70–99)
Potassium: 3.7 mmol/L (ref 3.5–5.1)
Potassium: 3.8 mmol/L (ref 3.5–5.1)
Sodium: 139 mmol/L (ref 135–145)
Sodium: 140 mmol/L (ref 135–145)

## 2019-01-23 LAB — TROPONIN I (HIGH SENSITIVITY)
Troponin I (High Sensitivity): 11 ng/L (ref <18)
Troponin I (High Sensitivity): 11 ng/L (ref <18)

## 2019-01-23 MED ORDER — SODIUM CHLORIDE 0.9% FLUSH
3.0000 mL | Freq: Once | INTRAVENOUS | Status: AC
  Administered 2019-01-24: 08:00:00 3 mL via INTRAVENOUS

## 2019-01-23 MED ORDER — SODIUM CHLORIDE 0.9% FLUSH: 3.0000 mL | Freq: Once | INTRAVENOUS | Status: DC

## 2019-01-23 NOTE — ED Triage Notes (Signed)
Pt reports R sided chest pain and pressure that travels into his R shoulder. Took 3 nitro earlier today and it stopped, but it started back. He took another nitro about 5 hours ago without relief. Denies SOB or cough. He has a hx of MI.

## 2019-01-23 NOTE — ED Triage Notes (Signed)
Pt to triage with c/o non-radiating right sided chest pain described as tightness.  Pt states started at noon and he took 2 NTG and it eased off.  Pt states pain returned just PTA and is sharp.  Pt denies n/v or SHOB.

## 2019-01-24 ENCOUNTER — Encounter (HOSPITAL_COMMUNITY): Payer: Self-pay | Admitting: Internal Medicine

## 2019-01-24 ENCOUNTER — Observation Stay (HOSPITAL_COMMUNITY)
Admit: 2019-01-24 | Discharge: 2019-01-24 | Disposition: A | Discharge status: Home / Self Care | Payer: Medicare Other | Attending: Cardiology | Admitting: Cardiology

## 2019-01-24 DIAGNOSIS — I25119 Atherosclerotic heart disease of native coronary artery with unspecified angina pectoris: Secondary | ICD-10-CM | POA: Diagnosis not present

## 2019-01-24 DIAGNOSIS — E1159 Type 2 diabetes mellitus with other circulatory complications: Secondary | ICD-10-CM | POA: Diagnosis present

## 2019-01-24 DIAGNOSIS — R079 Chest pain, unspecified: Secondary | ICD-10-CM

## 2019-01-24 DIAGNOSIS — I5032 Chronic diastolic (congestive) heart failure: Secondary | ICD-10-CM | POA: Insufficient documentation

## 2019-01-24 DIAGNOSIS — I10 Essential (primary) hypertension: Secondary | ICD-10-CM | POA: Diagnosis not present

## 2019-01-24 LAB — COMPREHENSIVE METABOLIC PANEL
ALT: 22 U/L (ref 0–44)
AST: 39 U/L (ref 15–41)
Albumin: 3.8 g/dL (ref 3.5–5.0)
Alkaline Phosphatase: 54 U/L (ref 38–126)
Anion gap: 14 (ref 5–15)
BUN: 15 mg/dL (ref 8–23)
CO2: 25 mmol/L (ref 22–32)
Calcium: 8.8 mg/dL — ABNORMAL LOW (ref 8.9–10.3)
Chloride: 103 mmol/L (ref 98–111)
Creatinine, Ser: 1.02 mg/dL (ref 0.61–1.24)
GFR calc Af Amer: >60 mL/min (ref >60)
GFR calc non Af Amer: >60 mL/min (ref >60)
Glucose, Bld: 163 mg/dL — ABNORMAL HIGH (ref 70–99)
Potassium: 3.6 mmol/L (ref 3.5–5.1)
Sodium: 142 mmol/L (ref 135–145)
Total Bilirubin: 1.4 mg/dL — ABNORMAL HIGH (ref 0.3–1.2)
Total Protein: 7.4 g/dL (ref 6.5–8.1)

## 2019-01-24 LAB — GLUCOSE, CAPILLARY
Glucose-Capillary: 164 mg/dL — ABNORMAL HIGH (ref 70–99)
Glucose-Capillary: 131 mg/dL — ABNORMAL HIGH (ref 70–99)
Glucose-Capillary: 214 mg/dL — ABNORMAL HIGH (ref 70–99)
Glucose-Capillary: 204 mg/dL — ABNORMAL HIGH (ref 70–99)

## 2019-01-24 LAB — CBC WITH DIFFERENTIAL/PLATELET
Abs Immature Granulocytes: 0.02 10*3/uL (ref 0.00–0.07)
Basophils Absolute: 0.1 10*3/uL (ref 0.0–0.1)
Basophils Relative: 1 %
Eosinophils Absolute: 0.2 10*3/uL (ref 0.0–0.5)
Eosinophils Relative: 2 %
HCT: 42 % (ref 39.0–52.0)
Hemoglobin: 13.8 g/dL (ref 13.0–17.0)
Immature Granulocytes: 0 %
Lymphocytes Relative: 39 %
Lymphs Abs: 3 10*3/uL (ref 0.7–4.0)
MCH: 28.6 pg (ref 26.0–34.0)
MCHC: 32.9 g/dL (ref 30.0–36.0)
MCV: 87 fL (ref 80.0–100.0)
Monocytes Absolute: 0.5 10*3/uL (ref 0.1–1.0)
Monocytes Relative: 7 %
Neutro Abs: 4.1 10*3/uL (ref 1.7–7.7)
Neutrophils Relative %: 51 %
Platelets: 255 10*3/uL (ref 150–400)
RBC: 4.83 MIL/uL (ref 4.22–5.81)
RDW: 14.6 % (ref 11.5–15.5)
WBC: 7.9 10*3/uL (ref 4.0–10.5)
nRBC: 0 % (ref 0.0–0.2)

## 2019-01-24 LAB — SARS CORONAVIRUS 2 (TAT 6-24 HRS): SARS Coronavirus 2: NEGATIVE

## 2019-01-24 LAB — NM MYOCAR MULTI W/SPECT W/WALL MOTION / EF
Exercise duration (min): 4 min
Exercise duration (sec): 30 s
Rest HR: 66 {beats}/min

## 2019-01-24 LAB — HEMOGLOBIN A1C
Hgb A1c MFr Bld: 8.2 % — ABNORMAL HIGH (ref 4.8–5.6)
Mean Plasma Glucose: 188.64 mg/dL

## 2019-01-24 MED ORDER — CLOPIDOGREL BISULFATE 75 MG PO TABS
75.0000 mg | ORAL_TABLET | Freq: Every day | ORAL | Status: DC
  Administered 2019-01-24 – 2019-01-27 (×4): 75 mg via ORAL
  Filled 2019-01-24 (×5): qty 1

## 2019-01-24 MED ORDER — HYDRALAZINE HCL 50 MG PO TABS
50.0000 mg | ORAL_TABLET | Freq: Two times a day (BID) | ORAL | Status: DC
  Administered 2019-01-24: 09:00:00 50 mg via ORAL
  Filled 2019-01-24: qty 1

## 2019-01-24 MED ORDER — ACETAMINOPHEN 650 MG RE SUPP: 650.0000 mg | Freq: Four times a day (QID) | RECTAL | Status: DC | PRN

## 2019-01-24 MED ORDER — NITROGLYCERIN 0.4 MG SL SUBL
0.4000 mg | SUBLINGUAL_TABLET | SUBLINGUAL | Status: DC | PRN
  Administered 2019-01-24: 22:00:00 0.4 mg via SUBLINGUAL
  Filled 2019-01-24: qty 1

## 2019-01-24 MED ORDER — CARVEDILOL 25 MG PO TABS
25.0000 mg | ORAL_TABLET | Freq: Two times a day (BID) | ORAL | Status: DC
  Administered 2019-01-24 – 2019-01-26 (×6): 25 mg via ORAL
  Filled 2019-01-24 (×8): qty 1

## 2019-01-24 MED ORDER — ISOSORBIDE MONONITRATE ER 30 MG PO TB24
30.0000 mg | ORAL_TABLET | Freq: Every day | ORAL | Status: DC
  Administered 2019-01-24: 09:00:00 30 mg via ORAL
  Filled 2019-01-24: qty 1

## 2019-01-24 MED ORDER — ASPIRIN 81 MG PO CHEW: 81.0000 mg | CHEWABLE_TABLET | ORAL | Status: AC

## 2019-01-24 MED ORDER — PANTOPRAZOLE SODIUM 40 MG PO TBEC
40.0000 mg | DELAYED_RELEASE_TABLET | Freq: Every day | ORAL | Status: DC
  Administered 2019-01-24 – 2019-01-27 (×4): 40 mg via ORAL
  Filled 2019-01-24 (×4): qty 1

## 2019-01-24 MED ORDER — SODIUM CHLORIDE 0.9 % WEIGHT BASED INFUSION: 1.0000 mL/kg/h | INTRAVENOUS | Status: DC

## 2019-01-24 MED ORDER — ENOXAPARIN SODIUM 40 MG/0.4ML ~~LOC~~ SOLN
40.0000 mg | Freq: Every day | SUBCUTANEOUS | Status: DC
  Administered 2019-01-24 – 2019-01-25 (×2): 40 mg via SUBCUTANEOUS
  Filled 2019-01-24 (×3): qty 0.4

## 2019-01-24 MED ORDER — ACETAMINOPHEN 325 MG PO TABS: 650.0000 mg | ORAL_TABLET | Freq: Four times a day (QID) | ORAL | Status: DC | PRN

## 2019-01-24 MED ORDER — ISOSORBIDE MONONITRATE ER 60 MG PO TB24
60.0000 mg | ORAL_TABLET | Freq: Every day | ORAL | Status: DC
  Administered 2019-01-25 – 2019-01-27 (×3): 60 mg via ORAL
  Filled 2019-01-24 (×3): qty 1

## 2019-01-24 MED ORDER — ASPIRIN EC 81 MG PO TBEC
81.0000 mg | DELAYED_RELEASE_TABLET | Freq: Every day | ORAL | Status: DC
  Administered 2019-01-24 – 2019-01-27 (×5): 81 mg via ORAL
  Filled 2019-01-24 (×5): qty 1

## 2019-01-24 MED ORDER — LOSARTAN POTASSIUM 50 MG PO TABS
100.0000 mg | ORAL_TABLET | Freq: Every day | ORAL | Status: DC
  Administered 2019-01-24 – 2019-01-27 (×4): 100 mg via ORAL
  Filled 2019-01-24 (×4): qty 2

## 2019-01-24 MED ORDER — REGADENOSON 0.4 MG/5ML IV SOLN
INTRAVENOUS | Status: AC
  Administered 2019-01-24: 12:00:00 0.4 mg
  Filled 2019-01-24: qty 5

## 2019-01-24 MED ORDER — FUROSEMIDE 20 MG PO TABS
20.0000 mg | ORAL_TABLET | Freq: Every day | ORAL | Status: DC
  Administered 2019-01-24 – 2019-01-26 (×3): 20 mg via ORAL
  Filled 2019-01-24 (×3): qty 1

## 2019-01-24 MED ORDER — INSULIN GLARGINE 100 UNIT/ML ~~LOC~~ SOLN
95.0000 [IU] | Freq: Every day | SUBCUTANEOUS | Status: DC
  Administered 2019-01-24 – 2019-01-26 (×3): 95 [IU] via SUBCUTANEOUS
  Filled 2019-01-24 (×6): qty 0.95

## 2019-01-24 MED ORDER — HYDRALAZINE HCL 50 MG PO TABS
75.0000 mg | ORAL_TABLET | Freq: Three times a day (TID) | ORAL | Status: DC
  Administered 2019-01-24 – 2019-01-27 (×9): 75 mg via ORAL
  Filled 2019-01-24 (×9): qty 1

## 2019-01-24 MED ORDER — TECHNETIUM TC 99M TETROFOSMIN IV KIT
30.0000 | PACK | Freq: Once | INTRAVENOUS | Status: AC | PRN
  Administered 2019-01-24: 30 via INTRAVENOUS

## 2019-01-24 MED ORDER — ROSUVASTATIN CALCIUM 20 MG PO TABS
20.0000 mg | ORAL_TABLET | Freq: Every day | ORAL | Status: DC
  Administered 2019-01-24 – 2019-01-26 (×3): 20 mg via ORAL
  Filled 2019-01-24 (×4): qty 1

## 2019-01-24 MED ORDER — SODIUM CHLORIDE 0.9 % WEIGHT BASED INFUSION: 3.0000 mL/kg/h | INTRAVENOUS | Status: DC

## 2019-01-24 MED ORDER — SODIUM CHLORIDE 0.9% FLUSH: 3.0000 mL | INTRAVENOUS | Status: DC | PRN

## 2019-01-24 MED ORDER — ALBUTEROL SULFATE HFA 108 (90 BASE) MCG/ACT IN AERS
2.0000 | INHALATION_SPRAY | RESPIRATORY_TRACT | Status: DC | PRN
  Filled 2019-01-24: qty 6.7

## 2019-01-24 MED ORDER — REGADENOSON 0.4 MG/5ML IV SOLN: 0.4000 mg | Freq: Once | INTRAVENOUS | Status: DC

## 2019-01-24 MED ORDER — INSULIN ASPART 100 UNIT/ML ~~LOC~~ SOLN
0.0000 [IU] | Freq: Three times a day (TID) | SUBCUTANEOUS | Status: DC
  Administered 2019-01-25: 10:00:00 3 [IU] via SUBCUTANEOUS
  Administered 2019-01-25: 18:00:00 2 [IU] via SUBCUTANEOUS
  Administered 2019-01-25 – 2019-01-26 (×2): 3 [IU] via SUBCUTANEOUS
  Administered 2019-01-26: 18:00:00 1 [IU] via SUBCUTANEOUS
  Administered 2019-01-26 – 2019-01-27 (×2): 2 [IU] via SUBCUTANEOUS

## 2019-01-24 MED ORDER — INSULIN ASPART 100 UNIT/ML ~~LOC~~ SOLN
0.0000 [IU] | SUBCUTANEOUS | Status: DC
  Administered 2019-01-24: 15:00:00 3 [IU] via SUBCUTANEOUS
  Administered 2019-01-24: 06:00:00 2 [IU] via SUBCUTANEOUS
  Administered 2019-01-24: 1 [IU] via SUBCUTANEOUS
  Filled 2019-01-24: qty 0.09

## 2019-01-24 MED ORDER — ONDANSETRON HCL 4 MG PO TABS: 4.0000 mg | ORAL_TABLET | Freq: Four times a day (QID) | ORAL | Status: DC | PRN

## 2019-01-24 MED ORDER — HYDRALAZINE HCL 50 MG PO TABS
50.0000 mg | ORAL_TABLET | Freq: Three times a day (TID) | ORAL | Status: DC
  Administered 2019-01-24: 50 mg via ORAL
  Filled 2019-01-24: qty 1

## 2019-01-24 MED ORDER — ONDANSETRON HCL 4 MG/2ML IJ SOLN: 4.0000 mg | Freq: Four times a day (QID) | INTRAMUSCULAR | Status: DC | PRN

## 2019-01-24 MED ORDER — ASPIRIN 81 MG PO CHEW
324.0000 mg | CHEWABLE_TABLET | Freq: Once | ORAL | Status: AC
  Administered 2019-01-24: 02:00:00 324 mg via ORAL
  Filled 2019-01-24: qty 4

## 2019-01-24 MED ORDER — AMIODARONE HCL 200 MG PO TABS
200.0000 mg | ORAL_TABLET | Freq: Every day | ORAL | Status: DC
  Administered 2019-01-24 – 2019-01-27 (×4): 200 mg via ORAL
  Filled 2019-01-24 (×4): qty 1

## 2019-01-24 MED ORDER — TECHNETIUM TC 99M TETROFOSMIN IV KIT
10.0000 | PACK | Freq: Once | INTRAVENOUS | Status: AC | PRN
  Administered 2019-01-24: 10 via INTRAVENOUS

## 2019-01-24 NOTE — Progress Notes (Signed)
-   Patient was admitted earlier today with chest pain. - Patient has been transferred over to [facility] for cardiac stress test and further cardiac management.   -Patient still pending at [facility] will be Dr. Karie Kirks
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
## 2019-01-24 NOTE — Plan of Care (Signed)

## 2019-01-24 NOTE — ED Notes (Signed)
Pt. Documented in error see above note in chart. 

## 2019-01-24 NOTE — Progress Notes (Cosign Needed)
Patient admitted originally for chest pain. Transferred after Nuc Stress test. Seems to be in good spirits, no current pain complaints.
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
## 2019-01-24 NOTE — Progress Notes (Signed)
Reviewed patients myovue To be read by radiology Appears to have LVH TID at 1.25 thinning of inferior lateral wall And mild apical ischemia Diffuse hypokinesis EF 41% Given decreased EF and known CAD with previous RCA stent And moderate LAD disease by cath in 2018 and exertional chest pain Would consider cath on Monday  Dr Gardiner Rhyme to discuss with patient in am  Jenkins Rouge

## 2019-01-24 NOTE — Progress Notes (Signed)
The patient is a 68 yr old man who presented to [facility] with complaints of chest pain. The patient was evaluated by cardiology and the decision was made to transfer him to [facility] to undergo a Myoview Stress test. Given the patient's low EF, apical ischemia, and moderate LAD disease by cath in 2018 and his presentation with exertional chest pain, cardiology wishes to consider LHC on Monday.  Vitals Stable  Labwork appears stable.  The patient is seen following his stress test. He is resting quietly and is not awakened.

## 2019-01-25 ENCOUNTER — Inpatient Hospital Stay (HOSPITAL_COMMUNITY): Payer: Medicare Other

## 2019-01-25 DIAGNOSIS — Z888 Allergy status to other drugs, medicaments and biological substances status: Secondary | ICD-10-CM | POA: Diagnosis not present

## 2019-01-25 DIAGNOSIS — Z955 Presence of coronary angioplasty implant and graft: Secondary | ICD-10-CM | POA: Diagnosis not present

## 2019-01-25 DIAGNOSIS — K219 Gastro-esophageal reflux disease without esophagitis: Secondary | ICD-10-CM | POA: Diagnosis present

## 2019-01-25 DIAGNOSIS — I5032 Chronic diastolic (congestive) heart failure: Secondary | ICD-10-CM | POA: Diagnosis not present

## 2019-01-25 DIAGNOSIS — I25119 Atherosclerotic heart disease of native coronary artery with unspecified angina pectoris: Secondary | ICD-10-CM | POA: Diagnosis not present

## 2019-01-25 DIAGNOSIS — I10 Essential (primary) hypertension: Secondary | ICD-10-CM | POA: Diagnosis not present

## 2019-01-25 DIAGNOSIS — Z8673 Personal history of transient ischemic attack (TIA), and cerebral infarction without residual deficits: Secondary | ICD-10-CM | POA: Diagnosis not present

## 2019-01-25 DIAGNOSIS — E1159 Type 2 diabetes mellitus with other circulatory complications: Secondary | ICD-10-CM | POA: Diagnosis present

## 2019-01-25 DIAGNOSIS — I252 Old myocardial infarction: Secondary | ICD-10-CM | POA: Diagnosis not present

## 2019-01-25 DIAGNOSIS — J449 Chronic obstructive pulmonary disease, unspecified: Secondary | ICD-10-CM | POA: Diagnosis present

## 2019-01-25 DIAGNOSIS — I249 Acute ischemic heart disease, unspecified: Secondary | ICD-10-CM | POA: Diagnosis present

## 2019-01-25 DIAGNOSIS — Z9842 Cataract extraction status, left eye: Secondary | ICD-10-CM | POA: Diagnosis not present

## 2019-01-25 DIAGNOSIS — Z20828 Contact with and (suspected) exposure to other viral communicable diseases: Secondary | ICD-10-CM | POA: Diagnosis present

## 2019-01-25 DIAGNOSIS — R079 Chest pain, unspecified: Secondary | ICD-10-CM | POA: Diagnosis not present

## 2019-01-25 DIAGNOSIS — Z79899 Other long term (current) drug therapy: Secondary | ICD-10-CM | POA: Diagnosis not present

## 2019-01-25 DIAGNOSIS — M1990 Unspecified osteoarthritis, unspecified site: Secondary | ICD-10-CM | POA: Diagnosis present

## 2019-01-25 DIAGNOSIS — I110 Hypertensive heart disease with heart failure: Secondary | ICD-10-CM | POA: Diagnosis present

## 2019-01-25 DIAGNOSIS — Z87891 Personal history of nicotine dependence: Secondary | ICD-10-CM | POA: Diagnosis not present

## 2019-01-25 DIAGNOSIS — Z7902 Long term (current) use of antithrombotics/antiplatelets: Secondary | ICD-10-CM | POA: Diagnosis not present

## 2019-01-25 DIAGNOSIS — G4733 Obstructive sleep apnea (adult) (pediatric): Secondary | ICD-10-CM | POA: Diagnosis present

## 2019-01-25 DIAGNOSIS — I200 Unstable angina: Secondary | ICD-10-CM

## 2019-01-25 DIAGNOSIS — E785 Hyperlipidemia, unspecified: Secondary | ICD-10-CM | POA: Diagnosis present

## 2019-01-25 DIAGNOSIS — R072 Precordial pain: Secondary | ICD-10-CM | POA: Diagnosis not present

## 2019-01-25 DIAGNOSIS — Z6841 Body Mass Index (BMI) 40.0 and over, adult: Secondary | ICD-10-CM | POA: Diagnosis not present

## 2019-01-25 DIAGNOSIS — I25110 Atherosclerotic heart disease of native coronary artery with unstable angina pectoris: Secondary | ICD-10-CM | POA: Diagnosis present

## 2019-01-25 DIAGNOSIS — I493 Ventricular premature depolarization: Secondary | ICD-10-CM | POA: Diagnosis present

## 2019-01-25 DIAGNOSIS — Z96653 Presence of artificial knee joint, bilateral: Secondary | ICD-10-CM | POA: Diagnosis present

## 2019-01-25 DIAGNOSIS — Z7982 Long term (current) use of aspirin: Secondary | ICD-10-CM | POA: Diagnosis not present

## 2019-01-25 DIAGNOSIS — Z9841 Cataract extraction status, right eye: Secondary | ICD-10-CM | POA: Diagnosis not present

## 2019-01-25 DIAGNOSIS — Z794 Long term (current) use of insulin: Secondary | ICD-10-CM | POA: Diagnosis not present

## 2019-01-25 LAB — GLUCOSE, CAPILLARY
Glucose-Capillary: 223 mg/dL — ABNORMAL HIGH (ref 70–99)
Glucose-Capillary: 216 mg/dL — ABNORMAL HIGH (ref 70–99)
Glucose-Capillary: 172 mg/dL — ABNORMAL HIGH (ref 70–99)
Glucose-Capillary: 157 mg/dL — ABNORMAL HIGH (ref 70–99)

## 2019-01-25 LAB — HIV ANTIBODY (ROUTINE TESTING W REFLEX): HIV Screen 4th Generation wRfx: NONREACTIVE

## 2019-01-25 LAB — ECHOCARDIOGRAM COMPLETE
Height: 69 in
Weight: 4366.4 oz

## 2019-01-25 LAB — HEPARIN LEVEL (UNFRACTIONATED): Heparin Unfractionated: 0.96 IU/mL — ABNORMAL HIGH (ref 0.30–0.70)

## 2019-01-25 MED ORDER — HEPARIN (PORCINE) 25000 UT/250ML-% IV SOLN
1100.0000 [IU]/h | INTRAVENOUS | Status: DC
  Administered 2019-01-25: 12:00:00 1200 [IU]/h via INTRAVENOUS
  Administered 2019-01-26: 08:00:00 1000 [IU]/h via INTRAVENOUS
  Filled 2019-01-25 (×2): qty 250

## 2019-01-25 MED ORDER — PERFLUTREN LIPID MICROSPHERE
1.0000 mL | INTRAVENOUS | Status: AC | PRN
  Administered 2019-01-25: 16:00:00 2 mL via INTRAVENOUS
  Filled 2019-01-25: qty 10

## 2019-01-25 MED ORDER — SODIUM CHLORIDE 0.9 % IV SOLN: INTRAVENOUS | Status: DC

## 2019-01-25 MED ORDER — SODIUM CHLORIDE 0.9% FLUSH
3.0000 mL | Freq: Two times a day (BID) | INTRAVENOUS | Status: DC
  Administered 2019-01-25: 12:00:00 3 mL via INTRAVENOUS

## 2019-01-25 MED ORDER — SODIUM CHLORIDE 0.9% FLUSH: 3.0000 mL | INTRAVENOUS | Status: DC | PRN

## 2019-01-25 MED ORDER — HEPARIN (PORCINE) 25000 UT/250ML-% IV SOLN: 10.0000 [IU]/kg/h | INTRAVENOUS | Status: DC

## 2019-01-25 MED ORDER — HEPARIN BOLUS VIA INFUSION
4000.0000 [IU] | Freq: Once | INTRAVENOUS | Status: AC
  Administered 2019-01-25: 12:00:00 4000 [IU] via INTRAVENOUS
  Filled 2019-01-25: qty 4000

## 2019-01-25 MED ORDER — SODIUM CHLORIDE 0.9 % IV SOLN: 250.0000 mL | INTRAVENOUS | Status: DC | PRN

## 2019-01-25 NOTE — Progress Notes (Signed)
ANTICOAGULATION CONSULT NOTE - Initial Up Consult  Pharmacy Consult for warfarin Indication: chest pain/ACS/STEMI  Allergies  Allergen Reactions  . Lipitor [Atorvastatin]     myalgias    Patient Measurements: Weight: 272 lb 14.4 oz (123.8 kg) Heparin Dosing Weight: 97.7 kg  Vital Signs: Temp: 98 F (36.7 C) (09/06 0527) Temp Source: Oral (09/06 0527) BP: 169/78 (09/06 0527) Pulse Rate: 59 (09/06 0527)  Labs: Recent Labs    01/23/19 1840 01/23/19 2143 01/24/19 0529 01/24/19 0620  HGB 13.8 14.0 13.8  --   HCT 42.7 43.4 42.0  --   PLT 241 231 255  --   CREATININE 1.24 1.17  --  1.02  TROPONINIHS 11 11  --   --     Estimated Creatinine Clearance: 90.1 mL/min (by C-G formula based on SCr of 1.02 mg/dL).   Medications:  Scheduled:  . amiodarone  200 mg Oral Daily  . aspirin  81 mg Oral Pre-Cath  . aspirin EC  81 mg Oral Daily  . carvedilol  25 mg Oral BID WC  . clopidogrel  75 mg Oral Daily  . furosemide  20 mg Oral Daily  . hydrALAZINE  75 mg Oral Q8H  . insulin aspart  0-9 Units Subcutaneous TID WC  . insulin glargine  95 Units Subcutaneous QHS  . isosorbide mononitrate  60 mg Oral Daily  . losartan  100 mg Oral Daily  . pantoprazole  40 mg Oral Daily  . rosuvastatin  20 mg Oral q1800    Assessment: Pt is a 74 yom who transferred form [place], with chief complaint of chest pain and history of CAD. troponin's negative. no CBC from today, but H&H has been stable with 9/5 hgb 13.8 and plts wnl. No anticoagulation PTA, just clopidogrel. Cath maybe on Monday per Dr. Johnsie Cancel note.    Goal of Therapy:  Heparin level 0.3-0.7 units/ml Monitor platelets by anticoagulation protocol: Yes   Plan:  Give 4000 units bolus x 1 Start heparin infusion at 1200 units/hr Check anti-Xa level in 6 hours and daily while on heparin Continue to monitor H&H and platelets   Thank you,   Eddie Candle, PharmD PGY-1 Pharmacy Resident   Please check amion for clinical  pharmacist contact number 01/25/2019,10:30 AM

## 2019-01-25 NOTE — Progress Notes (Signed)
PROGRESS NOTE  James Maxwell [id number] DOB: November 29, 1950 DOA: 01/23/2019 PCP: Dionisio David, MD  Brief History   The patient is a 68 yr old man who presented to [facility] with complaints of chest pain. The patient was evaluated by cardiology and the decision was made to transfer him to [facility] to undergo a Myoview Stress test. Given the patient's low EF, apical ischemia, and moderate LAD disease by cath in 2018 and his presentation with exertional chest pain, cardiology wishes to consider LHC on Monday.  Consultants  . Cardiology  Procedures  . Myoview stress test  Antibiotics   Anti-infectives (From admission, onward)   None    .  Subjective  The patient is resting comfortably. He tells me that he did have another brief episode of chest pain this morning. No new complaints.  Objective   Vitals:  Vitals:   01/25/19 0527 01/25/19 1402  BP: (!) 169/78 (!) 158/72  Pulse: (!) 59 76  Resp: 18 19  Temp: 98 F (36.7 C) 98.2 F (36.8 C)  SpO2: 97% 98%    Exam:  Constitutional:  The patient is awake, alert, and oriented x 3. No acute distress. Respiratory:  . No increased work of breathing . No wheezes, rales, or rhonchi . No tactile fremitus. Cardiovascular:  . Regular rate and rhythm . No lateral PMI. No thrills.  . No murmurs, ectopy, or gallups. Abdomen:  . Abdomen is soft, non-tender, non-distended. . No hernias, masses, or organomegaly . Normoactive bowel sounds.  Musculoskeletal:  . No cyanosis, clubbing, or edema Skin:  . No rashes, lesions, ulcers . palpation of skin: no induration or nodules Neurologic:  . CN 2-12 intact . Sensation all 4 extremities intact Psychiatric:  . Mental status o Mood, affect appropriate o Orientation to person, place, time  . judgment and insight appear intact   I have personally reviewed the following:   Today's Data  . Vitals  Cardiology Data  . Myoview stress  Scheduled Meds: . amiodarone  200 mg Oral  Daily  . aspirin  81 mg Oral Pre-Cath  . aspirin EC  81 mg Oral Daily  . carvedilol  25 mg Oral BID WC  . clopidogrel  75 mg Oral Daily  . furosemide  20 mg Oral Daily  . hydrALAZINE  75 mg Oral Q8H  . insulin aspart  0-9 Units Subcutaneous TID WC  . insulin glargine  95 Units Subcutaneous QHS  . isosorbide mononitrate  60 mg Oral Daily  . losartan  100 mg Oral Daily  . pantoprazole  40 mg Oral Daily  . rosuvastatin  20 mg Oral q1800  . sodium chloride flush  3 mL Intravenous Q12H   Continuous Infusions: . sodium chloride    . [START ON 01/27/2019] sodium chloride    . sodium chloride    . heparin 1,200 Units/hr (01/25/19 1154)    Principal Problem:   Chest pain Active Problems:   Hypertension   Coronary atherosclerosis of native coronary artery   Type 2 diabetes mellitus with vascular disease (HCC)   ACS (acute coronary syndrome) (HCC)   LOS: 0 days   A & P   Chest pain: Due to ACS with reduced EF and LAD disease. Cardiology is consulted and recommends LHC on Monday or Tuesday.  Chest pain with history of CAD concerning for unstable angina -patient is on beta-blockers statins aspirin Plavix Imdur which will be continued.  Cardiology consulted.  Pt transferred to [facility] at  recommendation of Cardiology for Myoview stress test. Strss test positive for LAD disease and decreased EF. Plan is for [facility] on 9/7 or 9/8.  Hypertension on losartan Coreg hydralazine Imdur.  Blood pressure mildly elevated closely follow blood pressure trends.  Diabetes mellitus type 2 on Lantus insulin which patient had taken last night.  Follow CBGs closely with sliding scale coverage.  History of SVT on amiodarone.  History of sleep apnea on CPAP.  I have seen and examined this patient myself. I have spent 32 minutes in his evaluation and care.  DVT prophylaxis: Lovenox. Code Status: Full code. Family Communication: Discussed with patient. Disposition Plan: Home.  Kleigh Hoelzer, DO [facility] Direct contact: see [email or web address]  7PM-7AM contact night coverage as above 01/25/2019, 3:47 PM  LOS: 0 days

## 2019-01-25 NOTE — Care Management Obs Status (Signed)
[facility] NOTIFICATION   Patient Details  Name: James Maxwell MRN: [id number] Date of Birth: 02/24/1951   Medicare Observation Status Notification Given:  Yes    Wende Neighbors, LCSW 01/25/2019, 10:43 AM

## 2019-01-25 NOTE — Progress Notes (Signed)
ANTICOAGULATION CONSULT NOTE - [place] for warfarin Indication: chest pain/ACS/STEMI  Allergies  Allergen Reactions  . Lipitor [Atorvastatin]     myalgias    Patient Measurements: Weight: 272 lb 14.4 oz (123.8 kg) Heparin Dosing Weight: 97.7 kg  Vital Signs: Temp: 98.2 F (36.8 C) (09/06 1402) Temp Source: Oral (09/06 1402) BP: 130/74 (09/06 1555) Pulse Rate: 76 (09/06 1402)  Labs: Recent Labs    01/23/19 1840 01/23/19 2143 01/24/19 0529 01/24/19 0620 01/25/19 1717  HGB 13.8 14.0 13.8  --   --   HCT 42.7 43.4 42.0  --   --   PLT 241 231 255  --   --   HEPARINUNFRC  --   --   --   --  0.96*  CREATININE 1.24 1.17  --  1.02  --   TROPONINIHS 11 11  --   --   --     Estimated Creatinine Clearance: 90.1 mL/min (by C-G formula based on SCr of 1.02 mg/dL).   Medications:  Scheduled:  . amiodarone  200 mg Oral Daily  . aspirin  81 mg Oral Pre-Cath  . aspirin EC  81 mg Oral Daily  . carvedilol  25 mg Oral BID WC  . clopidogrel  75 mg Oral Daily  . furosemide  20 mg Oral Daily  . hydrALAZINE  75 mg Oral Q8H  . insulin aspart  0-9 Units Subcutaneous TID WC  . insulin glargine  95 Units Subcutaneous QHS  . isosorbide mononitrate  60 mg Oral Daily  . losartan  100 mg Oral Daily  . pantoprazole  40 mg Oral Daily  . rosuvastatin  20 mg Oral q1800  . sodium chloride flush  3 mL Intravenous Q12H    Assessment: Pt is a 63 yom who transferred form [place], with chief complaint of chest pain and history of CAD. troponin's negative. no CBC from today, but H&H has been stable with 9/5 hgb 13.8 and plts wnl. No anticoagulation PTA, just clopidogrel. Cath maybe on Monday per Dr. Johnsie Cancel note.   Initial heparin level elevated at 0.96, no S/Sx bleeding per RN.  Goal of Therapy:  Heparin level 0.3-0.7 units/ml Monitor platelets by anticoagulation protocol: Yes   Plan:  -Reduce heparin to 1000 units/hr -Recheck heparin level in [place], PharmD, BCPS Clinical Pharmacist [phone] Please check AMION for all [facility] Pharmacy numbers 01/25/2019

## 2019-01-25 NOTE — Progress Notes (Signed)
Progress Note  Patient Name: [name] Date of Encounter: 01/25/2019  Primary Cardiologist: No primary care provider on file.   Subjective   Underwent stress MPI yesterday, showed mild  distal anterior and basal to mid inferolateral ischemia with EF 41%.  Had episode of chest pain at rest this morning, reported lasted 2-3 minutes, resolved with SL NTG.  Currently pain free.  Inpatient Medications    Scheduled Meds: . amiodarone  200 mg Oral Daily  . aspirin  81 mg Oral Pre-Cath  . aspirin EC  81 mg Oral Daily  . carvedilol  25 mg Oral BID WC  . clopidogrel  75 mg Oral Daily  . enoxaparin (LOVENOX) injection  40 mg Subcutaneous Daily  . furosemide  20 mg Oral Daily  . hydrALAZINE  75 mg Oral Q8H  . insulin aspart  0-9 Units Subcutaneous TID WC  . insulin glargine  95 Units Subcutaneous QHS  . isosorbide mononitrate  60 mg Oral Daily  . losartan  100 mg Oral Daily  . pantoprazole  40 mg Oral Daily  . rosuvastatin  20 mg Oral q1800   Continuous Infusions: . sodium chloride     PRN Meds: acetaminophen **OR** acetaminophen, albuterol, nitroGLYCERIN, ondansetron **OR** ondansetron (ZOFRAN) IV, sodium chloride flush   Vital Signs    Vitals:   01/24/19 0830 01/24/19 1725 01/24/19 2117 01/25/19 0527  BP: (!) 166/92 (!) 156/82 (!) 150/67 (!) 169/78  Pulse: 72 67 64 (!) 59  Resp: 18  18 18   Temp:   98.1 F (36.7 C) 98 F (36.7 C)  TempSrc:   Oral Oral  SpO2: 96% 97% 97% 97%  Weight:    123.8 kg    Intake/Output Summary (Last 24 hours) at 01/25/2019 1007 Last data filed at 01/25/2019 [id number] Gross per 24 hour  Intake 480 ml  Output -  Net 480 ml   Last 3 Weights 01/25/2019 01/23/2019 07/17/2018  Weight (lbs) 272 lb 14.4 oz 263 lb 272 lb  Weight (kg) 123.787 kg 119.296 kg 123.378 kg      Telemetry    sinsu rhythm with rates 50-60s, PVCs - Personally Reviewed  ECG    No recent ECG - Personally Reviewed  Physical Exam   GEN: No acute distress.   Neck: No  JVD Cardiac: RRR, no murmurs, rubs, or gallops.  Respiratory: Clear to auscultation bilaterally. GI: Soft, nontender, non-distended  MS: No edema; No deformity. Neuro:  Nonfocal  Psych: Normal affect   Labs    High Sensitivity Troponin:   Recent Labs  Lab 01/23/19 1840 01/23/19 2143  TROPONINIHS 11 11      Chemistry Recent Labs  Lab 01/23/19 1840 01/23/19 2143 01/24/19 0620  NA 139 140 142  K 3.7 3.8 3.6  CL 102 105 103  CO2 23 23 25   GLUCOSE 164* 138* 163*  BUN 16 17 15   CREATININE 1.24 1.17 1.02  CALCIUM 9.1 9.1 8.8*  PROT  --   --  7.4  ALBUMIN  --   --  3.8  AST  --   --  39  ALT  --   --  22  ALKPHOS  --   --  54  BILITOT  --   --  1.4*  GFRNONAA 59* >60 >60  GFRAA >60 >60 >60  ANIONGAP 14 12 14      Hematology Recent Labs  Lab 01/23/19 1840 01/23/19 2143 01/24/19 0529  WBC 8.8 8.8 7.9  RBC 5.04 4.98 4.83  HGB 13.8 14.0 13.8  HCT 42.7 43.4 42.0  MCV 84.7 87.1 87.0  MCH 27.4 28.1 28.6  MCHC 32.3 32.3 32.9  RDW 14.4 14.5 14.6  PLT 241 231 255    BNPNo results for input(s): BNP, PROBNP in the last 168 hours.   DDimer No results for input(s): DDIMER in the last 168 hours.   Radiology    Dg Chest 2 View  Result Date: 01/23/2019 CLINICAL DATA:  Right chest pain EXAM: CHEST - 2 VIEW COMPARISON:  07/17/2018 FINDINGS: Heart and mediastinal contours are within normal limits. No focal opacities or effusions. No acute bony abnormality. IMPRESSION: No active cardiopulmonary disease. Electronically Signed   By: Rolm Baptise M.D.   On: 01/23/2019 19:06   Nm Myocar Multi W/spect W/wall Motion / Ef  Result Date: 01/24/2019 CLINICAL DATA:  Chest pain. EXAM: MYOCARDIAL IMAGING WITH SPECT (REST AND PHARMACOLOGIC-STRESS) GATED LEFT VENTRICULAR WALL MOTION STUDY LEFT VENTRICULAR EJECTION FRACTION TECHNIQUE: Standard myocardial SPECT imaging was performed after resting intravenous injection of 10 mCi Tc-50m Myoview. Subsequently, intravenous infusion of Lexiscan was  performed under the supervision of the Cardiology staff. At peak effect of the drug, 30 mCi Tc-46m Myoview was injected intravenously and standard myocardial SPECT imaging was performed. Quantitative gated imaging was also performed to evaluate left ventricular wall motion, and estimate left ventricular ejection fraction. COMPARISON:  None. FINDINGS: Perfusion: Moderate size area of mild reversibility involving the proximal and mid inferolateral wall and a small area of mild reversibility within the distal anterior septum. Wall Motion: Moderate left ventricular dilatation. Mild lateral wall hypokinesis identified. There is also mild decreased wall motion involving the mid inferior wall and mid and proximal and mid anterior septum. Left Ventricular Ejection Fraction: 41 % End diastolic volume 0000000 ml End systolic volume 75 ml IMPRESSION: 1. Two areas of mild reversibility are identified within the distal anterior septum and proximal and mid inferolateral wall. 2. Moderate lateral, inferior wall and anterior septal hypokinesis. Mild left ventricular dilatation. 3. Left ventricular ejection fraction 41% 4. Non invasive risk stratification*: Intermediate to high *2012 Appropriate Use Criteria for Coronary Revascularization Focused Update: J Am Coll Cardiol. N6492421. http://content.airportbarriers.com.aspx?articleid=1201161 Electronically Signed   By: Kerby Moors M.D.   On: 01/24/2019 14:06    Cardiac Studies   SPECT 01/24/19: 1. Two areas of mild reversibility are identified within the distal anterior septum and proximal and mid inferolateral wall. 2. Moderate lateral, inferior wall and anterior septal hypokinesis. Mild left ventricular dilatation. 3. Left ventricular ejection fraction 41% 4. Non invasive risk stratification*: Intermediate to high  TTE 12/1917: EF [id number], grade 1 diastolic dysfunction, mild MR  Cath 01/25/2017: 40% proximal to mid LAD stenosis, high-grade restenosis in mid RCA  treated with DES.    Patient Profile     68 y.o. male male with a hx of CAD, chronic diastolic heart failure, hypertension, diabetes, OSA who presents with chest pain  Assessment & Plan    Unstable angina: presented with chest pain, lasting a few minutes minutes and resolved with SL NTG.  EKG and troponins unremarkable.  Stress MPI 9/5 shows inferolateral and apical ischemia, EF 41%.  This morning had episode of rest pain, lasted a few minutes and resolved with SL NTG - Given resting pain, will treat as unstable angina, start heparin gtt - Continue ASA and plavix - For antianginals, would continue carvedilol 25 mg BID and increased imdur to 60 mg daily - Continue rosuvastatin 20 mg daily - TTE given new systolic dysfunction  on MPI - Will plan for cath on Tuesday.  Risks and benefits of cardiac catheterization have been discussed with the patient.  These include bleeding, infection, kidney damage, stroke, heart attack, death.  The patient understands these risks and is willing to proceed.  CAD: s/p RCA stenting in 2006 and again in 2018.  Also with mild LAD disease on 2018 cath -Continue aspirin and Plavix -Continue rosuvastatin 20 mg daily -Continue carvedilol 25 mg twice daily  Tachyarrhythmia: unclear history, reports that he presented to an outside ED with a tachyarrhythmia (unclear if AF or SVT), was started on amiodarone - Currently in sinus rhythm.  Continue amio 200 mg daily.  Will need to obtain records  HTN: appears uncontrolled.   - Continue losartan 100 mg, carvedilol 25 mg BID, imdur.  Hydralazine increased today to 75 mg TID  HFpEF: appears mildly volume overloaded.  BNP normal, though he is obese.  Continue home lasix  Frequent PVCs: continue carvedilol 25 mg BID     For questions or updates, please contact [place] Please consult [email or web address] for contact info under        Signed, Donato Heinz, MD  01/25/2019, 10:07 AM

## 2019-01-25 NOTE — Progress Notes (Signed)
  Echocardiogram 2D Echocardiogram has been performed.  Ercel Pepitone L Androw 01/25/2019, 4:57 PM

## 2019-01-26 DIAGNOSIS — I493 Ventricular premature depolarization: Secondary | ICD-10-CM

## 2019-01-26 DIAGNOSIS — I249 Acute ischemic heart disease, unspecified: Secondary | ICD-10-CM

## 2019-01-26 DIAGNOSIS — I25110 Atherosclerotic heart disease of native coronary artery with unstable angina pectoris: Principal | ICD-10-CM

## 2019-01-26 LAB — BASIC METABOLIC PANEL
Anion gap: 9 (ref 5–15)
BUN: 12 mg/dL (ref 8–23)
CO2: 26 mmol/L (ref 22–32)
Calcium: 8.6 mg/dL — ABNORMAL LOW (ref 8.9–10.3)
Chloride: 103 mmol/L (ref 98–111)
Creatinine, Ser: 1.13 mg/dL (ref 0.61–1.24)
GFR calc Af Amer: >60 mL/min (ref >60)
GFR calc non Af Amer: >60 mL/min (ref >60)
Glucose, Bld: 174 mg/dL — ABNORMAL HIGH (ref 70–99)
Potassium: 3.6 mmol/L (ref 3.5–5.1)
Sodium: 138 mmol/L (ref 135–145)

## 2019-01-26 LAB — CBC
HCT: 39.3 % (ref 39.0–52.0)
Hemoglobin: 13.1 g/dL (ref 13.0–17.0)
MCH: 28.2 pg (ref 26.0–34.0)
MCHC: 33.3 g/dL (ref 30.0–36.0)
MCV: 84.5 fL (ref 80.0–100.0)
Platelets: 203 10*3/uL (ref 150–400)
RBC: 4.65 MIL/uL (ref 4.22–5.81)
RDW: 14.2 % (ref 11.5–15.5)
WBC: 6.6 10*3/uL (ref 4.0–10.5)
nRBC: 0 % (ref 0.0–0.2)

## 2019-01-26 LAB — HEPARIN LEVEL (UNFRACTIONATED)
Heparin Unfractionated: 0.43 IU/mL (ref 0.30–0.70)
Heparin Unfractionated: 0.27 IU/mL — ABNORMAL LOW (ref 0.30–0.70)
Heparin Unfractionated: 0.4 IU/mL (ref 0.30–0.70)

## 2019-01-26 LAB — GLUCOSE, CAPILLARY
Glucose-Capillary: 207 mg/dL — ABNORMAL HIGH (ref 70–99)
Glucose-Capillary: 152 mg/dL — ABNORMAL HIGH (ref 70–99)
Glucose-Capillary: 146 mg/dL — ABNORMAL HIGH (ref 70–99)
Glucose-Capillary: 202 mg/dL — ABNORMAL HIGH (ref 70–99)

## 2019-01-26 MED ORDER — HEPARIN BOLUS VIA INFUSION
1450.0000 [IU] | Freq: Once | INTRAVENOUS | Status: AC
  Administered 2019-01-26: 13:00:00 1450 [IU] via INTRAVENOUS
  Filled 2019-01-26: qty 1450

## 2019-01-26 NOTE — Progress Notes (Signed)
ANTICOAGULATION CONSULT NOTE - [place] for warfarin Indication: chest pain/ACS/STEMI  Allergies  Allergen Reactions  . Lipitor [Atorvastatin]     myalgias    Patient Measurements: Weight: 273 lb 1.6 oz (123.9 kg) Heparin Dosing Weight: 97.7 kg  Vital Signs: Temp: 98.1 F (36.7 C) (09/07 0532) Temp Source: Oral (09/07 0532) BP: 132/64 (09/07 0532) Pulse Rate: 59 (09/07 0532)  Labs: Recent Labs    01/23/19 1840 01/23/19 2143 01/24/19 0529 01/24/19 0620 01/25/19 1717 01/26/19 0250 01/26/19 1007  HGB 13.8 14.0 13.8  --   --  13.1  --   HCT 42.7 43.4 42.0  --   --  39.3  --   PLT 241 231 255  --   --  203  --   HEPARINUNFRC  --   --   --   --  0.96* 0.43 0.27*  CREATININE 1.24 1.17  --  1.02  --  1.13  --   TROPONINIHS 11 11  --   --   --   --   --     Estimated Creatinine Clearance: 81.4 mL/min (by C-G formula based on SCr of 1.13 mg/dL).  Assessment: Pt is a 9 yom who transferred form [place], with chief complaint of chest pain and history of CAD. troponin's negative.  No anticoagulation PTA, just clopidogrel. Cath maybe on Monday per Dr. Johnsie Cancel note.   Heparin level today is subtherapeutic at 0.27 on 1000 units/hour. H&H is stable at 13.1/39.3 and plts are wnl.   Goal of Therapy:  Heparin level 0.3-0.7 units/ml Monitor platelets by anticoagulation protocol: Yes   Plan:  - bolus heparin 1450 units x 1 -increase heparin to 1100 units/hr -Check heparin level in 6 hours - daily heparin level and CBC - monitor for signs of bleeding    Thank you,   Eddie Candle, PharmD PGY-1 Pharmacy Resident   Please check amion for clinical pharmacist contact number

## 2019-01-26 NOTE — H&P (View-Only) (Signed)
Progress Note  Patient Name: James Maxwell Date of Encounter: 01/26/2019  Primary Cardiologist: No primary care provider on file. Dr. Gardiner Rhyme  Subjective   Chest pain yesterday.  Inpatient Medications    Scheduled Meds: . amiodarone  200 mg Oral Daily  . aspirin EC  81 mg Oral Daily  . carvedilol  25 mg Oral BID WC  . clopidogrel  75 mg Oral Daily  . furosemide  20 mg Oral Daily  . hydrALAZINE  75 mg Oral Q8H  . insulin aspart  0-9 Units Subcutaneous TID WC  . insulin glargine  95 Units Subcutaneous QHS  . isosorbide mononitrate  60 mg Oral Daily  . losartan  100 mg Oral Daily  . pantoprazole  40 mg Oral Daily  . rosuvastatin  20 mg Oral q1800  . sodium chloride flush  3 mL Intravenous Q12H   Continuous Infusions: . sodium chloride    . [START ON 01/27/2019] sodium chloride    . sodium chloride    . heparin 1,000 Units/hr (01/26/19 0738)   PRN Meds: sodium chloride, acetaminophen **OR** acetaminophen, albuterol, nitroGLYCERIN, ondansetron **OR** ondansetron (ZOFRAN) IV, sodium chloride flush, sodium chloride flush   Vital Signs    Vitals:   01/25/19 1402 01/25/19 1555 01/25/19 2114 01/26/19 0532  BP: (!) 158/72 130/74 136/68 132/64  Pulse: 76  (!) 59 (!) 59  Resp: 19  19 18   Temp: 98.2 F (36.8 C)  97.7 F (36.5 C) 98.1 F (36.7 C)  TempSrc: Oral  Oral Oral  SpO2: 98%  98% 97%  Weight:    123.9 kg    Intake/Output Summary (Last 24 hours) at 01/26/2019 0957 Last data filed at 01/26/2019 0500 Gross per 24 hour  Intake 460.91 ml  Output -  Net 460.91 ml   Last 3 Weights 01/26/2019 01/25/2019 01/23/2019  Weight (lbs) 273 lb 1.6 oz 272 lb 14.4 oz 263 lb  Weight (kg) 123.877 kg 123.787 kg 119.296 kg      Telemetry    NSR - Personally Reviewed  ECG    NSR - Personally Reviewed  Physical Exam   GEN: No acute distress.   Neck: No JVD Cardiac: RRR, no murmurs, rubs, or gallops.  Respiratory: Clear to auscultation bilaterally. GI: Soft, nontender,  non-distended  MS: No edema; No deformity. Neuro:  Nonfocal  Psych: Normal affect   Labs    High Sensitivity Troponin:   Recent Labs  Lab 01/23/19 1840 01/23/19 2143  TROPONINIHS 11 11      Chemistry Recent Labs  Lab 01/23/19 2143 01/24/19 0620 01/26/19 0250  NA 140 142 138  K 3.8 3.6 3.6  CL 105 103 103  CO2 23 25 26   GLUCOSE 138* 163* 174*  BUN 17 15 12   CREATININE 1.17 1.02 1.13  CALCIUM 9.1 8.8* 8.6*  PROT  --  7.4  --   ALBUMIN  --  3.8  --   AST  --  39  --   ALT  --  22  --   ALKPHOS  --  54  --   BILITOT  --  1.4*  --   GFRNONAA >60 >60 >60  GFRAA >60 >60 >60  ANIONGAP 12 14 9      Hematology Recent Labs  Lab 01/23/19 2143 01/24/19 0529 01/26/19 0250  WBC 8.8 7.9 6.6  RBC 4.98 4.83 4.65  HGB 14.0 13.8 13.1  HCT 43.4 42.0 39.3  MCV 87.1 87.0 84.5  MCH 28.1 28.6 28.2  MCHC  32.3 32.9 33.3  RDW 14.5 14.6 14.2  PLT 231 255 203    BNPNo results for input(s): BNP, PROBNP in the last 168 hours.   DDimer No results for input(s): DDIMER in the last 168 hours.   Radiology    Nm Myocar Multi W/spect W/wall Motion / Ef  Result Date: 01/24/2019 CLINICAL DATA:  Chest pain. EXAM: MYOCARDIAL IMAGING WITH SPECT (REST AND PHARMACOLOGIC-STRESS) GATED LEFT VENTRICULAR WALL MOTION STUDY LEFT VENTRICULAR EJECTION FRACTION TECHNIQUE: Standard myocardial SPECT imaging was performed after resting intravenous injection of 10 mCi Tc-58m Myoview. Subsequently, intravenous infusion of Lexiscan was performed under the supervision of the Cardiology staff. At peak effect of the drug, 30 mCi Tc-15m Myoview was injected intravenously and standard myocardial SPECT imaging was performed. Quantitative gated imaging was also performed to evaluate left ventricular wall motion, and estimate left ventricular ejection fraction. COMPARISON:  None. FINDINGS: Perfusion: Moderate size area of mild reversibility involving the proximal and mid inferolateral wall and a small area of mild  reversibility within the distal anterior septum. Wall Motion: Moderate left ventricular dilatation. Mild lateral wall hypokinesis identified. There is also mild decreased wall motion involving the mid inferior wall and mid and proximal and mid anterior septum. Left Ventricular Ejection Fraction: 41 % End diastolic volume 0000000 ml End systolic volume 75 ml IMPRESSION: 1. Two areas of mild reversibility are identified within the distal anterior septum and proximal and mid inferolateral wall. 2. Moderate lateral, inferior wall and anterior septal hypokinesis. Mild left ventricular dilatation. 3. Left ventricular ejection fraction 41% 4. Non invasive risk stratification*: Intermediate to high *2012 Appropriate Use Criteria for Coronary Revascularization Focused Update: J Am Coll Cardiol. [id number]. http://content.airportbarriers.com.aspx?articleid=1201161 Electronically Signed   By: Kerby Moors M.D.   On: 01/24/2019 14:06    Cardiac Studies   Prior cath results reviewed  Patient Profile     68 y.o. male with CAD, [place]  Assessment & Plan    1) CAD:  Plan for cath tomorrow.  Precath orders in place.  REnal function stable.  All questions answered.  Already on dual antiplatelet therapy.  Abnormal stress test.    PVCs: Has been on beta blocker.   DM: sugar 174 this AM.  Sliding scale insulin.     For questions or updates, please contact [facility] Please consult [email or web address] for contact info under        Signed, Larae Grooms, MD  01/26/2019, 9:57 AM

## 2019-01-26 NOTE — Progress Notes (Signed)
ANTICOAGULATION CONSULT NOTE - [place] for heparin Indication: chest pain/ACS  Allergies  Allergen Reactions  . Lipitor [Atorvastatin]     myalgias    Patient Measurements: Weight: 273 lb 1.6 oz (123.9 kg) Heparin Dosing Weight: 97.7 kg  Vital Signs: Temp: 97.8 F (36.6 C) (09/07 2118) Temp Source: Oral (09/07 2118) BP: 133/73 (09/07 2118) Pulse Rate: 63 (09/07 2118)  Labs: Recent Labs    01/24/19 0529 01/24/19 0620  01/26/19 0250 01/26/19 1007 01/26/19 1831  HGB 13.8  --   --  13.1  --   --   HCT 42.0  --   --  39.3  --   --   PLT 255  --   --  203  --   --   HEPARINUNFRC  --   --    < > 0.43 0.27* 0.40  CREATININE  --  1.02  --  1.13  --   --    < > = values in this interval not displayed.    Estimated Creatinine Clearance: 81.4 mL/min (by C-G formula based on SCr of 1.13 mg/dL).  Assessment: Pt is a 68 yo M who transferred form [facility], with chief complaint of chest pain and history of CAD. Troponin negative.  No anticoagulation PTA.  Plan for cardiac cath 9/8.  Heparin level therapeutic on 1100 units/hr.  Goal of Therapy:  Heparin level 0.3-0.7 units/ml Monitor platelets by anticoagulation protocol: Yes   Plan:  Continue heparin at 1100 units/hr Next heparin level with AM labs. Follow-up daily CBC and heparin level.  [employer], Pharm.D., BCPS Clinical Pharmacist  **Pharmacist phone directory can now be found on [email or web address] ([email or web address]).  Listed under [place].  01/26/2019 10:16 PM

## 2019-01-26 NOTE — Progress Notes (Signed)
ANTICOAGULATION CONSULT NOTE - [place] [facility] for warfarin Indication: chest pain/ACS/STEMI  Allergies  Allergen Reactions  . Lipitor [Atorvastatin]     myalgias    Patient Measurements: Weight: 272 lb 14.4 oz (123.8 kg) Heparin Dosing Weight: 97.7 kg  Vital Signs: Temp: 97.7 F (36.5 C) (09/06 2114) Temp Source: Oral (09/06 2114) BP: 136/68 (09/06 2114) Pulse Rate: 59 (09/06 2114)  Labs: Recent Labs    01/23/19 1840 01/23/19 2143 01/24/19 0529 01/24/19 0620 01/25/19 1717 01/26/19 0250  HGB 13.8 14.0 13.8  --   --  13.1  HCT 42.7 43.4 42.0  --   --  39.3  PLT 241 231 255  --   --  203  HEPARINUNFRC  --   --   --   --  0.96* 0.43  CREATININE 1.24 1.17  --  1.02  --   --   TROPONINIHS 11 11  --   --   --   --     Estimated Creatinine Clearance: 90.1 mL/min (by C-G formula based on SCr of 1.02 mg/dL).  Assessment: Pt is a 83 yom who transferred form [place], with chief complaint of chest pain and history of CAD. troponin's negative. no CBC from today, but H&H has been stable with 9/5 hgb 13.8 and plts wnl. No anticoagulation PTA, just clopidogrel. Cath maybe on Monday per Dr. Johnsie Cancel note.   Heparin level therapeutic.  Goal of Therapy:  Heparin level 0.3-0.7 units/ml Monitor platelets by anticoagulation protocol: Yes   Plan:  -Continue heparin at 1000 units/hr -Check heparin level later today to confirm  Thanks for allowing pharmacy to be a part of this patient's care.  Excell Seltzer, PharmD Clinical Pharmacist 01/26/2019

## 2019-01-26 NOTE — Progress Notes (Signed)
PROGRESS NOTE  EPIGMENIO KOLTON [id number] DOB: [birth year] DOA: 01/23/2019 PCP: Dionisio David, MD  Brief History   The patient is a 68 yr old man who presented to [facility] with complaints of chest pain. The patient was evaluated by cardiology and the decision was made to transfer him to [facility] to undergo a Myoview Stress test. Given the patient's low EF, apical ischemia, and moderate LAD disease by cath in 2018 and his presentation with exertional chest pain, cardiology wishes to consider LHC on Monday.  The patient had a brief episode of chest pain on 01/25/2019 that was relieved with NTG. No further episodes today.  Consultants  . Cardiology  Procedures  . Myoview stress test +  Antibiotics   Anti-infectives (From admission, onward)   None     Subjective  The patient is resting comfortably. No new complaints.  Objective   Vitals:  Vitals:   01/26/19 0532 01/26/19 1432  BP: 132/64 111/60  Pulse: (!) 59 60  Resp: 18   Temp: 98.1 F (36.7 C) 97.7 F (36.5 C)  SpO2: 97% 97%    Exam:  Constitutional:  The patient is awake, alert, and oriented x 3. No acute distress. Respiratory:  . No increased work of breathing . No wheezes, rales, or rhonchi . No tactile fremitus. Cardiovascular:  . Regular rate and rhythm . No lateral PMI. No thrills.  . No murmurs, ectopy, or gallups. Abdomen:  . Abdomen is soft, non-tender, non-distended. . No hernias, masses, or organomegaly . Normoactive bowel sounds.  Musculoskeletal:  . No cyanosis, clubbing, or edema Skin:  . No rashes, lesions, ulcers . palpation of skin: no induration or nodules Neurologic:  . CN 2-12 intact . Sensation all 4 extremities intact Psychiatric:  . Mental status o Mood, affect appropriate o Orientation to person, place, time  . judgment and insight appear intact   I have personally reviewed the following:   Today's Data  . Vitals  Cardiology Data  . Myoview stress  Scheduled  Meds: . amiodarone  200 mg Oral Daily  . aspirin EC  81 mg Oral Daily  . carvedilol  25 mg Oral BID WC  . clopidogrel  75 mg Oral Daily  . furosemide  20 mg Oral Daily  . hydrALAZINE  75 mg Oral Q8H  . insulin aspart  0-9 Units Subcutaneous TID WC  . insulin glargine  95 Units Subcutaneous QHS  . isosorbide mononitrate  60 mg Oral Daily  . losartan  100 mg Oral Daily  . pantoprazole  40 mg Oral Daily  . rosuvastatin  20 mg Oral q1800  . sodium chloride flush  3 mL Intravenous Q12H   Continuous Infusions: . sodium chloride    . [START ON 01/27/2019] sodium chloride    . sodium chloride    . heparin 1,100 Units/hr (01/26/19 1303)    Principal Problem:   Chest pain Active Problems:   Hypertension   Coronary atherosclerosis of native coronary artery   Type 2 diabetes mellitus with vascular disease (HCC)   ACS (acute coronary syndrome) (HCC)   LOS: 1 day   A & P  Chest pain with history of CAD concerning for unstable angina -patient is on beta-blockers statins aspirin Plavix Imdur which will be continued.  Cardiology consulted.  Pt transferred to [facility] at recommendation of Cardiology for Myoview stress test. Strss test positive for LAD disease and decreased EF. Echocardiogram was obtained on 01/25/2019. EF 40-45%. Plan is for [facility]  on9/8.  Hypertension on losartan Coreg hydralazine Imdur.  Blood pressure mildly elevated closely follow blood pressure trends.  Diabetes mellitus type 2 on Lantus insulin which patient had taken last night.  Follow CBGs closely with sliding scale coverage.  History of SVT on amiodarone.  History of sleep apnea on CPAP.  I have seen and examined this patient myself. I have spent 32 minutes in his evaluation and care.  DVT prophylaxis: Lovenox. Code Status: Full code. Family Communication: Discussed with patient. Disposition Plan: Home.  Talana Slatten, DO [facility] Direct contact: see [email or web address]  7PM-7AM contact night coverage as  above 01/26/2019, 3:33 PM  LOS: 0 days

## 2019-01-26 NOTE — Progress Notes (Addendum)
Progress Note  Patient Name: James Maxwell Date of Encounter: 01/26/2019  Primary Cardiologist: No primary care provider on file. Dr. Gardiner Rhyme  Subjective   Chest pain yesterday.  Inpatient Medications    Scheduled Meds: . amiodarone  200 mg Oral Daily  . aspirin EC  81 mg Oral Daily  . carvedilol  25 mg Oral BID WC  . clopidogrel  75 mg Oral Daily  . furosemide  20 mg Oral Daily  . hydrALAZINE  75 mg Oral Q8H  . insulin aspart  0-9 Units Subcutaneous TID WC  . insulin glargine  95 Units Subcutaneous QHS  . isosorbide mononitrate  60 mg Oral Daily  . losartan  100 mg Oral Daily  . pantoprazole  40 mg Oral Daily  . rosuvastatin  20 mg Oral q1800  . sodium chloride flush  3 mL Intravenous Q12H   Continuous Infusions: . sodium chloride    . [START ON 01/27/2019] sodium chloride    . sodium chloride    . heparin 1,000 Units/hr (01/26/19 0738)   PRN Meds: sodium chloride, acetaminophen **OR** acetaminophen, albuterol, nitroGLYCERIN, ondansetron **OR** ondansetron (ZOFRAN) IV, sodium chloride flush, sodium chloride flush   Vital Signs    Vitals:   01/25/19 1402 01/25/19 1555 01/25/19 2114 01/26/19 0532  BP: (!) 158/72 130/74 136/68 132/64  Pulse: 76  (!) 59 (!) 59  Resp: 19  19 18   Temp: 98.2 F (36.8 C)  97.7 F (36.5 C) 98.1 F (36.7 C)  TempSrc: Oral  Oral Oral  SpO2: 98%  98% 97%  Weight:    123.9 kg    Intake/Output Summary (Last 24 hours) at 01/26/2019 0957 Last data filed at 01/26/2019 0500 Gross per 24 hour  Intake 460.91 ml  Output -  Net 460.91 ml   Last 3 Weights 01/26/2019 01/25/2019 01/23/2019  Weight (lbs) 273 lb 1.6 oz 272 lb 14.4 oz 263 lb  Weight (kg) 123.877 kg 123.787 kg 119.296 kg      Telemetry    NSR - Personally Reviewed  ECG    NSR - Personally Reviewed  Physical Exam   GEN: No acute distress.   Neck: No JVD Cardiac: RRR, no murmurs, rubs, or gallops.  Respiratory: Clear to auscultation bilaterally. GI: Soft, nontender,  non-distended  MS: No edema; No deformity. Neuro:  Nonfocal  Psych: Normal affect   Labs    High Sensitivity Troponin:   Recent Labs  Lab 01/23/19 1840 01/23/19 2143  TROPONINIHS 11 11      Chemistry Recent Labs  Lab 01/23/19 2143 01/24/19 0620 01/26/19 0250  NA 140 142 138  K 3.8 3.6 3.6  CL 105 103 103  CO2 23 25 26   GLUCOSE 138* 163* 174*  BUN 17 15 12   CREATININE 1.17 1.02 1.13  CALCIUM 9.1 8.8* 8.6*  PROT  --  7.4  --   ALBUMIN  --  3.8  --   AST  --  39  --   ALT  --  22  --   ALKPHOS  --  54  --   BILITOT  --  1.4*  --   GFRNONAA >60 >60 >60  GFRAA >60 >60 >60  ANIONGAP 12 14 9      Hematology Recent Labs  Lab 01/23/19 2143 01/24/19 0529 01/26/19 0250  WBC 8.8 7.9 6.6  RBC 4.98 4.83 4.65  HGB 14.0 13.8 13.1  HCT 43.4 42.0 39.3  MCV 87.1 87.0 84.5  MCH 28.1 28.6 28.2  MCHC  32.3 32.9 33.3  RDW 14.5 14.6 14.2  PLT 231 255 203    BNPNo results for input(s): BNP, PROBNP in the last 168 hours.   DDimer No results for input(s): DDIMER in the last 168 hours.   Radiology    Nm Myocar Multi W/spect W/wall Motion / Ef  Result Date: 01/24/2019 CLINICAL DATA:  Chest pain. EXAM: MYOCARDIAL IMAGING WITH SPECT (REST AND PHARMACOLOGIC-STRESS) GATED LEFT VENTRICULAR WALL MOTION STUDY LEFT VENTRICULAR EJECTION FRACTION TECHNIQUE: Standard myocardial SPECT imaging was performed after resting intravenous injection of 10 mCi Tc-62m Myoview. Subsequently, intravenous infusion of Lexiscan was performed under the supervision of the Cardiology staff. At peak effect of the drug, 30 mCi Tc-33m Myoview was injected intravenously and standard myocardial SPECT imaging was performed. Quantitative gated imaging was also performed to evaluate left ventricular wall motion, and estimate left ventricular ejection fraction. COMPARISON:  None. FINDINGS: Perfusion: Moderate size area of mild reversibility involving the proximal and mid inferolateral wall and a small area of mild  reversibility within the distal anterior septum. Wall Motion: Moderate left ventricular dilatation. Mild lateral wall hypokinesis identified. There is also mild decreased wall motion involving the mid inferior wall and mid and proximal and mid anterior septum. Left Ventricular Ejection Fraction: 41 % End diastolic volume 0000000 ml End systolic volume 75 ml IMPRESSION: 1. Two areas of mild reversibility are identified within the distal anterior septum and proximal and mid inferolateral wall. 2. Moderate lateral, inferior wall and anterior septal hypokinesis. Mild left ventricular dilatation. 3. Left ventricular ejection fraction 41% 4. Non invasive risk stratification*: Intermediate to high *2012 Appropriate Use Criteria for Coronary Revascularization Focused Update: J Am Coll Cardiol. [id number]. http://content.[email or web address].aspx?articleid=[email or web address] Electronically Signed   By: Kerby Moors M.D.   On: 01/24/2019 14:06    Cardiac Studies   Prior cath results reviewed  Patient Profile     68 y.o. male with CAD, [place]  Assessment & Plan    1) CAD:  Plan for cath tomorrow.  Precath orders in place.  REnal function stable.  All questions answered.  Already on dual antiplatelet therapy.  Abnormal stress test.    PVCs: Has been on beta blocker.   DM: sugar 174 this AM.  Sliding scale insulin.     For questions or updates, please contact [place] Please consult [email or web address] for contact info under        Signed, Larae Grooms, MD  01/26/2019, 9:57 AM

## 2019-01-27 ENCOUNTER — Other Ambulatory Visit: Payer: Self-pay | Admitting: Cardiology

## 2019-01-27 ENCOUNTER — Encounter (HOSPITAL_COMMUNITY)
Admission: EM | Disposition: A | Discharge status: Home / Self Care | Payer: Self-pay | Source: Home / Self Care | Attending: Internal Medicine

## 2019-01-27 ENCOUNTER — Encounter (HOSPITAL_COMMUNITY): Payer: Self-pay | Admitting: General Practice

## 2019-01-27 DIAGNOSIS — R002 Palpitations: Secondary | ICD-10-CM

## 2019-01-27 DIAGNOSIS — R072 Precordial pain: Secondary | ICD-10-CM

## 2019-01-27 HISTORY — PX: LEFT HEART CATH AND CORONARY ANGIOGRAPHY: CATH118249

## 2019-01-27 HISTORY — PX: CARDIAC CATHETERIZATION: SHX172

## 2019-01-27 LAB — GLUCOSE, CAPILLARY
Glucose-Capillary: 101 mg/dL — ABNORMAL HIGH (ref 70–99)
Glucose-Capillary: 182 mg/dL — ABNORMAL HIGH (ref 70–99)

## 2019-01-27 LAB — CBC
HCT: 39.9 % (ref 39.0–52.0)
Hemoglobin: 13 g/dL (ref 13.0–17.0)
MCH: 28 pg (ref 26.0–34.0)
MCHC: 32.6 g/dL (ref 30.0–36.0)
MCV: 85.8 fL (ref 80.0–100.0)
Platelets: 221 10*3/uL (ref 150–400)
RBC: 4.65 MIL/uL (ref 4.22–5.81)
RDW: 14.5 % (ref 11.5–15.5)
WBC: 6.1 10*3/uL (ref 4.0–10.5)
nRBC: 0 % (ref 0.0–0.2)

## 2019-01-27 LAB — HEPARIN LEVEL (UNFRACTIONATED): Heparin Unfractionated: 0.34 IU/mL (ref 0.30–0.70)

## 2019-01-27 SURGERY — LEFT HEART CATH AND CORONARY ANGIOGRAPHY
Anesthesia: LOCAL

## 2019-01-27 MED ORDER — VERAPAMIL HCL 2.5 MG/ML IV SOLN
INTRAVENOUS | Status: AC
  Filled 2019-01-27: qty 2

## 2019-01-27 MED ORDER — FUROSEMIDE 40 MG PO TABS
40.0000 mg | ORAL_TABLET | Freq: Every day | ORAL | Status: DC
  Administered 2019-01-27: 10:00:00 40 mg via ORAL
  Filled 2019-01-27: qty 1

## 2019-01-27 MED ORDER — IOHEXOL 350 MG/ML SOLN
INTRAVENOUS | Status: DC | PRN
  Administered 2019-01-27: 60 mL via INTRACARDIAC

## 2019-01-27 MED ORDER — FENTANYL CITRATE (PF) 100 MCG/2ML IJ SOLN
INTRAMUSCULAR | Status: AC
  Filled 2019-01-27: qty 2

## 2019-01-27 MED ORDER — HEPARIN SODIUM (PORCINE) 1000 UNIT/ML IJ SOLN
INTRAMUSCULAR | Status: AC
  Filled 2019-01-27: qty 1

## 2019-01-27 MED ORDER — SODIUM CHLORIDE 0.9% FLUSH: 3.0000 mL | Freq: Two times a day (BID) | INTRAVENOUS | Status: DC

## 2019-01-27 MED ORDER — METFORMIN HCL 1000 MG PO TABS: 1000.0000 mg | ORAL_TABLET | Freq: Two times a day (BID) | ORAL | 0 refills | Status: DC

## 2019-01-27 MED ORDER — SODIUM CHLORIDE 0.9 % WEIGHT BASED INFUSION
3.0000 mL/kg/h | INTRAVENOUS | Status: DC
  Administered 2019-01-27: 05:00:00 3 mL/kg/h via INTRAVENOUS

## 2019-01-27 MED ORDER — LIDOCAINE HCL (PF) 1 % IJ SOLN
INTRAMUSCULAR | Status: AC
  Filled 2019-01-27: qty 30

## 2019-01-27 MED ORDER — SODIUM CHLORIDE 0.9 % WEIGHT BASED INFUSION: 1.0000 mL/kg/h | INTRAVENOUS | Status: AC

## 2019-01-27 MED ORDER — LIDOCAINE HCL (PF) 1 % IJ SOLN
INTRAMUSCULAR | Status: DC | PRN
  Administered 2019-01-27: 2 mL via INTRADERMAL

## 2019-01-27 MED ORDER — MIDAZOLAM HCL 2 MG/2ML IJ SOLN
INTRAMUSCULAR | Status: DC | PRN
  Administered 2019-01-27: 1 mg via INTRAVENOUS

## 2019-01-27 MED ORDER — SODIUM CHLORIDE 0.9 % WEIGHT BASED INFUSION
1.0000 mL/kg/h | INTRAVENOUS | Status: DC
  Administered 2019-01-27: 05:00:00 1 mL/kg/h via INTRAVENOUS

## 2019-01-27 MED ORDER — SODIUM CHLORIDE 0.9% FLUSH: 3.0000 mL | INTRAVENOUS | Status: DC | PRN

## 2019-01-27 MED ORDER — SODIUM CHLORIDE 0.9 % IV SOLN: 250.0000 mL | INTRAVENOUS | Status: DC | PRN

## 2019-01-27 MED ORDER — HEPARIN SODIUM (PORCINE) 1000 UNIT/ML IJ SOLN
INTRAMUSCULAR | Status: DC | PRN
  Administered 2019-01-27: 6000 [IU] via INTRAVENOUS

## 2019-01-27 MED ORDER — HEPARIN (PORCINE) IN NACL 1000-0.9 UT/500ML-% IV SOLN
INTRAVENOUS | Status: DC | PRN
  Administered 2019-01-27 (×2): 500 mL

## 2019-01-27 MED ORDER — HEPARIN (PORCINE) IN NACL 1000-0.9 UT/500ML-% IV SOLN
INTRAVENOUS | Status: AC
  Filled 2019-01-27: qty 1000

## 2019-01-27 MED ORDER — FENTANYL CITRATE (PF) 100 MCG/2ML IJ SOLN
INTRAMUSCULAR | Status: DC | PRN
  Administered 2019-01-27: 25 ug via INTRAVENOUS

## 2019-01-27 MED ORDER — VERAPAMIL HCL 2.5 MG/ML IV SOLN
INTRAVENOUS | Status: DC | PRN
  Administered 2019-01-27: 10 mL via INTRA_ARTERIAL

## 2019-01-27 MED ORDER — FUROSEMIDE 40 MG PO TABS: 40.0000 mg | ORAL_TABLET | Freq: Every day | ORAL | 0 refills | Status: DC

## 2019-01-27 MED ORDER — HYDRALAZINE HCL 20 MG/ML IJ SOLN: 10.0000 mg | INTRAMUSCULAR | Status: AC | PRN

## 2019-01-27 MED ORDER — ISOSORBIDE MONONITRATE ER 60 MG PO TB24: 60.0000 mg | ORAL_TABLET | Freq: Every day | ORAL | 0 refills | Status: DC

## 2019-01-27 MED ORDER — HYDRALAZINE HCL 25 MG PO TABS: 75.0000 mg | ORAL_TABLET | Freq: Three times a day (TID) | ORAL | 0 refills | Status: DC

## 2019-01-27 MED ORDER — MIDAZOLAM HCL 2 MG/2ML IJ SOLN
INTRAMUSCULAR | Status: AC
  Filled 2019-01-27: qty 2

## 2019-01-27 SURGICAL SUPPLY — 11 items
CATH INFINITI 5 FR JL3.5 (CATHETERS) ×1 IMPLANT
CATH INFINITI 5FR AL1 (CATHETERS) ×1 IMPLANT
CATH INFINITI 5FR ANG PIGTAIL (CATHETERS) ×1 IMPLANT
DEVICE RAD COMP TR BAND LRG (VASCULAR PRODUCTS) ×1 IMPLANT
GLIDESHEATH SLEND SS 6F .021 (SHEATH) ×1 IMPLANT
GUIDEWIRE INQWIRE 1.5J.035X260 (WIRE) IMPLANT
INQWIRE 1.5J .035X260CM (WIRE) ×2
KIT HEART LEFT (KITS) ×2 IMPLANT
PACK CARDIAC CATHETERIZATION (CUSTOM PROCEDURE TRAY) ×2 IMPLANT
TRANSDUCER W/STOPCOCK (MISCELLANEOUS) ×2 IMPLANT
TUBING CIL FLEX 10 FLL-RA (TUBING) ×2 IMPLANT

## 2019-01-27 NOTE — Interval H&P Note (Signed)
History and Physical Interval Note:  01/27/2019 7:33 AM  James Maxwell  has presented today for surgery, with the diagnosis of unstable angina.  The various methods of treatment have been discussed with the patient and family. After consideration of risks, benefits and other options for treatment, the patient has consented to  Procedure(s): LEFT HEART CATH AND CORONARY ANGIOGRAPHY (N/A) as a surgical intervention.  The patient's history has been reviewed, patient examined, no change in status, stable for surgery.  I have reviewed the patient's chart and labs.  Questions were answered to the patient's satisfaction.     Cath Lab Visit (complete for each Cath Lab visit)  Clinical Evaluation Leading to the Procedure:   ACS: Yes.    Non-ACS:    Anginal Classification: CCS IV  Anti-ischemic medical therapy: Maximal Therapy (2 or more classes of medications)  Non-Invasive Test Results: No non-invasive testing performed  Prior CABG: No previous CABG       James Maxwell [facility] 01/27/2019 7:33 AM

## 2019-01-27 NOTE — Discharge Summary (Signed)
Physician Discharge Summary  SUNDANCE GAMA [id number] DOB: 09-30-1950 DOA: 01/23/2019  PCP: Dionisio David, MD  Admit date: 01/23/2019 Discharge date: 01/27/2019  Recommendations for Outpatient Follow-up:  1. Patient is to follow up with PCP in 7-10 days. 2. Patient is to follow up with Cardiology as directed. 3. Cardiac monitor as per cardiology  Follow-up Information    [facility] Follow up.   Why: The office will be mailing out your cardiac montior. Contact information: [place] [phone]       Jettie Booze, MD Follow up on 02/25/2019.   Specialties: Cardiology, Radiology, Interventional Cardiology Why: at 4:20pm for your follow up appt.  Contact information: [id number] [place] [address] [phone]            Discharge Diagnoses: Principal diagnosis is #1  1. Chest pain 2. CAD - medical management only 3. DM II 4. GERD 5. Hyperlipidemia 6. Hypertension 7. OSA 8. History of CVA 9. SVT  Discharge Condition: Fair Disposition: Home  Diet recommendation: Heart healthy/Carbohydrate controlled  Filed Weights   01/25/19 0527 01/26/19 0532 01/27/19 0459  Weight: 123.8 kg 123.9 kg 123.8 kg    History of present illness: CARLOUS LENHART is a 68 y.o. male with history of CAD status post stenting last morning [id number], chronic diastolic CHF, hypertension, diabetes mellitus, sleep apnea presents to the ER with complaint of chest pain.  Patient chest pain started last known around 12 PM while at his home resting.  Pain was retrosternal nonradiating pressure-like.  Patient took sublingual nitroglycerin following which there was some improvement but the chest pain started recurring again.  At this point patient decided to come to the ER.  Patient recently went to [facility] but decided to come to [place] long.  He had taken a total of 4 sublingual  nitroglycerin.  Denies any associated shortness of breath productive cough fever chills abdominal pain nausea vomiting.  ED Course: In the ER patient initially was chest pain-free but had to be given sublingual nitroglycerin.  Pain improved but still persists.  EKG shows normal sinus rhythm troponin negative chest x-ray unremarkable patient afebrile COVID-19 test pending.  Given the features patient symptoms are concerning for unstable angina.  Hospital Course:  The patient is a 68 yr old man who presented to [facility] with complaints of chest pain. The patient was evaluated by cardiology and the decision was made to transfer him to [facility] to undergo a Myoview Stress test. Given the patient's low EF, apical ischemia, and moderate LAD disease by cath in 2018 and his presentation with exertional chest pain, cardiology wishes to consider LHC on Monday.  The patient had a brief episode of chest pain on 01/25/2019 that was relieved with NTG. No further episodes today.  The patient underwent a Myoview stress test that was concerning for CAD. He underwent LHC on 01/27/2019. It demonstrated moderate nonobstructive CAD. Lesions in the proximal LAD and mid RCA are unchanged from 2018. Stents in RCA are still patent. Cardiology recommends medical management only. They also recommend placement of a cardiac monitor as outpatient. They have cleared him for discharge.  Today's assessment: S: The patient is resting comfortably. No new complaints. O: Vitals:  Vitals:   01/27/19 0927 01/27/19 1430  BP: (!) 163/82 (!) 150/83  Pulse:  69  Resp:  19  Temp:  98.4 F (36.9 C)  SpO2:  92%  Constitutional:  The patient is awake, alert, and oriented x 3. No acute distress. Respiratory:   No increased work of breathing  No wheezes, rales, or rhonchi  No tactile fremitus. Cardiovascular:   Regular rate and rhythm  No lateral PMI. No thrills.   No murmurs, ectopy, or gallups. Abdomen:   Abdomen is  soft, non-tender, non-distended.  No hernias, masses, or organomegaly  Normoactive bowel sounds.  Musculoskeletal:   No cyanosis, clubbing, or edema Skin:   No rashes, lesions, ulcers  palpation of skin: no induration or nodules Neurologic:   CN 2-12 intact  Sensation all 4 extremities intact Psychiatric:   Mental status ? Mood, affect appropriate ? Orientation to person, place, time   judgment and insight appear intact   Discharge Instructions  Discharge Instructions    Activity as tolerated - No restrictions   Complete by: As directed    Call MD for:  difficulty breathing, headache or visual disturbances   Complete by: As directed    Call MD for:  extreme fatigue   Complete by: As directed    Call MD for:  severe uncontrolled pain   Complete by: As directed    Diet - low sodium heart healthy   Complete by: As directed    Carbohydrate controlled.   Discharge instructions   Complete by: As directed    Follow up with cardiology as directed. Cardiac monitor as per cardiology Follow up with PCP in 7-10 days.   Increase activity slowly   Complete by: As directed      Allergies as of 01/27/2019      Reactions   Lipitor [atorvastatin]    myalgias      Medication List    STOP taking these medications   cephALEXin 500 MG capsule Commonly known as: Keflex   guaiFENesin-codeine 100-10 MG/5ML syrup     TAKE these medications   Accu-Chek Aviva Plus test strip Generic drug: glucose blood   Accu-Chek Softclix Lancets lancets   albuterol 108 (90 Base) MCG/ACT inhaler Commonly known as: VENTOLIN HFA Inhale 1-2 puffs into the lungs every 4 (four) hours as needed for wheezing or shortness of breath. What changed: how much to take   albuterol (2.5 MG/3ML) 0.083% nebulizer solution Commonly known as: PROVENTIL Take 3 mLs (2.5 mg total) by nebulization every 4 (four) hours as needed for wheezing or shortness of breath. What changed: Another medication with the  same name was changed. Make sure you understand how and when to take each.   amiodarone 200 MG tablet Commonly known as: PACERONE Take 200 mg by mouth daily.   aspirin EC 81 MG tablet Take 81 mg by mouth daily.   B-D SINGLE USE SWABS REGULAR Pads   carvedilol 25 MG tablet Commonly known as: COREG Take 25 mg by mouth 2 (two) times daily with a meal.   cetirizine 10 MG tablet Commonly known as: ZYRTEC Take 10 mg by mouth daily.   clopidogrel 75 MG tablet Commonly known as: PLAVIX Take 75 mg by mouth daily.   Fiasp 100 UNIT/ML injection Generic drug: insulin aspart Inject 3-15 Units into the skin 3 (three) times daily before meals.   Fish Oil 1000 MG Caps Take 1,000 mg by mouth daily.   fluticasone 50 MCG/ACT nasal spray Commonly known as: FLONASE Place 1 spray into both nostrils daily as needed for allergies.   furosemide 40 MG tablet Commonly known as: LASIX Take 1 tablet (40 mg total) by mouth daily. Start taking on:  January 28, 2019 What changed:   medication strength  how much to take   hydrALAZINE 25 MG tablet Commonly known as: APRESOLINE Take 3 tablets (75 mg total) by mouth every 8 (eight) hours. What changed:   medication strength  how much to take  when to take this   isosorbide mononitrate 60 MG 24 hr tablet Commonly known as: IMDUR Take 1 tablet (60 mg total) by mouth daily. Start taking on: January 28, 2019 What changed:   medication strength  how much to take   Lantus SoloStar 100 UNIT/ML Solostar Pen Generic drug: Insulin Glargine Inject 95 Units into the skin at bedtime.   losartan 100 MG tablet Commonly known as: COZAAR Take 100 mg by mouth daily.   metFORMIN 1000 MG tablet Commonly known as: GLUCOPHAGE Take 1 tablet (1,000 mg total) by mouth 2 (two) times daily. Restart on 01/30/2019 Start taking on: January 30, 2019 What changed:   additional instructions  These instructions start on January 30, 2019. If you are  unsure what to do until then, ask your doctor or other care provider.   nitroGLYCERIN 0.4 MG SL tablet Commonly known as: NITROSTAT Place 1 tablet (0.4 mg total) under the tongue every 5 (five) minutes as needed for chest pain.   pantoprazole 40 MG tablet Commonly known as: PROTONIX Take 40 mg by mouth daily.   rosuvastatin 20 MG tablet Commonly known as: CRESTOR Take 20 mg by mouth daily.   vitamin C 1000 MG tablet Take 1,000 mg by mouth daily.   Vitamin D3 125 MCG (5000 UT) Caps Take 5,000 Units by mouth daily.   ZINC PO Take 1 capsule by mouth daily.      Allergies  Allergen Reactions  . Lipitor [Atorvastatin]     myalgias    The results of significant diagnostics from this hospitalization (including imaging, microbiology, ancillary and laboratory) are listed below for reference.    Significant Diagnostic Studies: Dg Chest 2 View  Result Date: 01/23/2019 CLINICAL DATA:  Right chest pain EXAM: CHEST - 2 VIEW COMPARISON:  07/17/2018 FINDINGS: Heart and mediastinal contours are within normal limits. No focal opacities or effusions. No acute bony abnormality. IMPRESSION: No active cardiopulmonary disease. Electronically Signed   By: Rolm Baptise M.D.   On: 01/23/2019 19:06   Nm Myocar Multi W/spect W/wall Motion / Ef  Result Date: 01/24/2019 CLINICAL DATA:  Chest pain. EXAM: MYOCARDIAL IMAGING WITH SPECT (REST AND PHARMACOLOGIC-STRESS) GATED LEFT VENTRICULAR WALL MOTION STUDY LEFT VENTRICULAR EJECTION FRACTION TECHNIQUE: Standard myocardial SPECT imaging was performed after resting intravenous injection of 10 mCi Tc-78m Myoview. Subsequently, intravenous infusion of Lexiscan was performed under the supervision of the Cardiology staff. At peak effect of the drug, 30 mCi Tc-74m Myoview was injected intravenously and standard myocardial SPECT imaging was performed. Quantitative gated imaging was also performed to evaluate left ventricular wall motion, and estimate left ventricular  ejection fraction. COMPARISON:  None. FINDINGS: Perfusion: Moderate size area of mild reversibility involving the proximal and mid inferolateral wall and a small area of mild reversibility within the distal anterior septum. Wall Motion: Moderate left ventricular dilatation. Mild lateral wall hypokinesis identified. There is also mild decreased wall motion involving the mid inferior wall and mid and proximal and mid anterior septum. Left Ventricular Ejection Fraction: 41 % End diastolic volume 0000000 ml End systolic volume 75 ml IMPRESSION: 1. Two areas of mild reversibility are identified within the distal anterior septum and proximal and mid inferolateral wall. 2. Moderate lateral, inferior  wall and anterior septal hypokinesis. Mild left ventricular dilatation. 3. Left ventricular ejection fraction 41% 4. Non invasive risk stratification*: Intermediate to high *2012 Appropriate Use Criteria for Coronary Revascularization Focused Update: J Am Coll Cardiol. [id number]. http://content.airportbarriers.com.aspx?articleid=1201161 Electronically Signed   By: Kerby Moors M.D.   On: 01/24/2019 14:06    Microbiology: Recent Results (from the past 240 hour(s))  SARS CORONAVIRUS 2 (TAT 6-24 HRS) Nasopharyngeal Nasopharyngeal Swab     Status: None   Collection Time: 01/24/19 12:50 AM   Specimen: Nasopharyngeal Swab  Result Value Ref Range Status   SARS Coronavirus 2 NEGATIVE NEGATIVE Final    Comment: (NOTE) SARS-CoV-2 target nucleic acids are NOT DETECTED. The SARS-CoV-2 RNA is generally detectable in upper and lower respiratory specimens during the acute phase of infection. Negative results do not preclude SARS-CoV-2 infection, do not rule out co-infections with other pathogens, and should not be used as the sole basis for treatment or other patient management decisions. Negative results must be combined with clinical observations, patient history, and epidemiological information. The  expected result is Negative. Fact Sheet for Patients: SugarRoll.be Fact Sheet for Healthcare Providers: https://www.woods-mathews.com/ This test is not yet approved or cleared by the [place] FDA and  has been authorized for detection and/or diagnosis of SARS-CoV-2 by FDA under an Emergency Use Authorization (EUA). This EUA will remain  in effect (meaning this test can be used) for the duration of the COVID-19 declaration under Section 56 4(b)(1) of the Act, 21 U.S.C. section 360bbb-3(b)(1), unless the authorization is terminated or revoked sooner. Performed at [facility], [place] [address] [place], [place] [address]      Labs: Basic Metabolic Panel: Recent Labs  Lab 01/23/19 1840 01/23/19 2143 01/24/19 0620 01/26/19 0250  NA 139 140 142 138  K 3.7 3.8 3.6 3.6  CL 102 105 103 103  CO2 23 23 25 26   GLUCOSE 164* 138* 163* 174*  BUN 16 17 15 12   CREATININE 1.24 1.17 1.02 1.13  CALCIUM 9.1 9.1 8.8* 8.6*   Liver Function Tests: Recent Labs  Lab 01/24/19 0620  AST 39  ALT 22  ALKPHOS 54  BILITOT 1.4*  PROT 7.4  ALBUMIN 3.8   No results for input(s): LIPASE, AMYLASE in the last 168 hours. No results for input(s): AMMONIA in the last 168 hours. CBC: Recent Labs  Lab 01/23/19 1840 01/23/19 2143 01/24/19 0529 01/26/19 0250 01/27/19 0443  WBC 8.8 8.8 7.9 6.6 6.1  NEUTROABS  --   --  4.1  --   --   HGB 13.8 14.0 13.8 13.1 13.0  HCT 42.7 43.4 42.0 39.3 39.9  MCV 84.7 87.1 87.0 84.5 85.8  PLT 241 231 255 203 221   Cardiac Enzymes: No results for input(s): CKTOTAL, CKMB, CKMBINDEX, TROPONINI in the last 168 hours. BNP: BNP (last 3 results) Recent Labs    07/17/18 2236  BNP 79.0    ProBNP (last 3 results) No results for input(s): PROBNP in the last 8760 hours.  CBG: Recent Labs  Lab 01/26/19 1210 01/26/19 1723 01/26/19 2115 01/27/19 0824 01/27/19 1156  GLUCAP 152* 146* 202* 101* 182*     Principal Problem:   Chest pain Active Problems:   Hypertension   Coronary atherosclerosis of native coronary artery   Type 2 diabetes mellitus with vascular disease ([place])   ACS (acute coronary syndrome) ([place])   Time coordinating discharge: 38 minutes.  Signed:        Kathleene Bergemann, DO [facility]  01/27/2019, 7:14 PM

## 2019-01-27 NOTE — Progress Notes (Addendum)
Progress Note  Patient Name: James Maxwell Date of Encounter: 01/27/2019  Primary Cardiologist: Irish Lack  Subjective   No chest pain. Back from cath.   Inpatient Medications    Scheduled Meds: . amiodarone  200 mg Oral Daily  . aspirin EC  81 mg Oral Daily  . carvedilol  25 mg Oral BID WC  . clopidogrel  75 mg Oral Daily  . furosemide  40 mg Oral Daily  . hydrALAZINE  75 mg Oral Q8H  . insulin aspart  0-9 Units Subcutaneous TID WC  . insulin glargine  95 Units Subcutaneous QHS  . isosorbide mononitrate  60 mg Oral Daily  . losartan  100 mg Oral Daily  . pantoprazole  40 mg Oral Daily  . rosuvastatin  20 mg Oral q1800  . sodium chloride flush  3 mL Intravenous Q12H  . sodium chloride flush  3 mL Intravenous Q12H   Continuous Infusions: . sodium chloride    . sodium chloride     PRN Meds: sodium chloride, acetaminophen **OR** acetaminophen, albuterol, hydrALAZINE, nitroGLYCERIN, ondansetron **OR** ondansetron (ZOFRAN) IV, sodium chloride flush   Vital Signs    Vitals:   01/27/19 0816 01/27/19 0857 01/27/19 0912 01/27/19 0927  BP: (!) 155/82 (!) 158/82 (!) 155/81 (!) 163/82  Pulse: 64     Resp: 18     Temp:      TempSrc:      SpO2: 99%     Weight:        Intake/Output Summary (Last 24 hours) at 01/27/2019 1031 Last data filed at 01/27/2019 0530 Gross per 24 hour  Intake 865.83 ml  Output -  Net 865.83 ml   Last 3 Weights 01/27/2019 01/26/2019 01/25/2019  Weight (lbs) 273 lb 273 lb 1.6 oz 272 lb 14.4 oz  Weight (kg) 123.832 kg 123.877 kg 123.787 kg      Telemetry    SR - Personally Reviewed  ECG    No new tracing - Personally Reviewed  Physical Exam  Pleasant gentleman, sitting up in bed. GEN: No acute distress.   Neck: No JVD Cardiac: RRR, no murmurs, rubs, or gallops.  Respiratory: Clear to auscultation bilaterally. GI: Soft, nontender, non-distended  MS: No edema; No deformity. Right radial site stable. Neuro:  Nonfocal  Psych: Normal affect    Labs    High Sensitivity Troponin:   Recent Labs  Lab 01/23/19 1840 01/23/19 2143  TROPONINIHS 11 11      Chemistry Recent Labs  Lab 01/23/19 2143 01/24/19 0620 01/26/19 0250  NA 140 142 138  K 3.8 3.6 3.6  CL 105 103 103  CO2 23 25 26   GLUCOSE 138* 163* 174*  BUN 17 15 12   CREATININE 1.17 1.02 1.13  CALCIUM 9.1 8.8* 8.6*  PROT  --  7.4  --   ALBUMIN  --  3.8  --   AST  --  39  --   ALT  --  22  --   ALKPHOS  --  54  --   BILITOT  --  1.4*  --   GFRNONAA >60 >60 >60  GFRAA >60 >60 >60  ANIONGAP 12 14 9      Hematology Recent Labs  Lab 01/24/19 0529 01/26/19 0250 01/27/19 0443  WBC 7.9 6.6 6.1  RBC 4.83 4.65 4.65  HGB 13.8 13.1 13.0  HCT 42.0 39.3 39.9  MCV 87.0 84.5 85.8  MCH 28.6 28.2 28.0  MCHC 32.9 33.3 32.6  RDW 14.6 14.2 14.5  PLT 255 203 221    BNPNo results for input(s): BNP, PROBNP in the last 168 hours.   DDimer No results for input(s): DDIMER in the last 168 hours.   Radiology    No results found.  Cardiac Studies   Cath: 01/27/19   Prox LAD lesion is 50% stenosed.  Prox Cx to Dist Cx lesion is 25% stenosed.  Previously placed Prox RCA to Mid RCA stent (unknown type) is widely patent.  Mid RCA lesion is 50% stenosed.  LV end diastolic pressure is mildly elevated.   1. Moderate nonobstructive CAD. Lesions in the proximal LAD and mid RCA are unchanged from 2018. Stents in the RCA are still patent 2. Elevated LVEDP 23 mm Hg  Plan: recommend medical management. Will increase lasix to 40 mg daily. Patient is a candidate for DC today.  Diagnostic Dominance: Right   TTE: 01/25/19  IMPRESSIONS    1. The left ventricle has mild-moderately reduced systolic function, with an ejection fraction of 40-45%. Anterior/anteroseptal hypokinesis. The cavity size was normal. Left ventricular diastolic Doppler parameters are consistent with  pseudonormalization.  2. The right ventricle has normal systolic function. The cavity was  normal. There is no increase in right ventricular wall thickness.  FINDINGS  Left Ventricle: The left ventricle has mild-moderately reduced systolic function, with an ejection fraction of 40-45%. The cavity size was normal. There is no increase in left ventricular wall thickness. Left ventricular diastolic Doppler parameters are  consistent with pseudonormalization. Definity contrast agent was given IV to delineate the left ventricular endocardial borders.  Right Ventricle: The right ventricle has normal systolic function. The cavity was normal. There is no increase in right ventricular wall thickness.  Left Atrium: Left atrial size was normal in size.  Right Atrium: Right atrial size was normal in size.  Interatrial Septum: The interatrial septum was not well visualized.  Pericardium: There is no evidence of pericardial effusion.  Mitral Valve: The mitral valve is grossly normal. Mitral valve regurgitation is not visualized by color flow Doppler.  Tricuspid Valve: The tricuspid valve is grossly normal. Tricuspid valve regurgitation was not visualized by color flow Doppler.  Aortic Valve: The aortic valve is grossly normal Aortic valve regurgitation was not visualized by color flow Doppler.  Pulmonic Valve: The pulmonic valve was not well visualized. Pulmonic valve regurgitation is not visualized by color flow Doppler.  Aorta: The aortic root is normal in size and structure. The aorta is normal unless otherwise noted.  Venous: The inferior vena cava was not well visualized.  Patient Profile     68 y.o. male with a hx of CAD, chronic diastolic heart failure, hypertension, diabetes, OSA who presents with chest pain.  Assessment & Plan    1. Unstable angina: presented with chest pain, lasting a few minutes minutes and resolved with SL NTG.  EKG and troponins unremarkable.  Stress MPI 9/5 shows inferolateral and apical ischemia, EF 41%.  Underwent cardiac cath noted above  with patent RCA stent, mild nonobstructive CAD in LAD and RCA. - Continue ASA and plavix, carvedilol 25 mg BID and increased imdur to 60 mg daily. - Continue rosuvastatin 20 mg daily  2. CAD: s/p RCA stenting in 2006 and again in 2018.Also with mild LAD disease on 2018 cath -Continue aspirin and Plavix, rosuvastatin 20 mg daily and BB  3. Tachyarrhythmia: unclear history, reports that he presented to an outside ED with a tachyarrhythmia (unclear if AF or SVT), was started on amiodarone at [facility]. - Currently  in sinus rhythm. Continue amio 200 mg daily with plans to decrease as an outpatient.  - no arrhthymias noted on telemetry. Will plan for outpatient cardiac monitor.   4. HTN: appears uncontrolled, though did not receive all meds until post cath. Suspect will improve. Have asked him to keep a log of blood pressures.Can consider norvasc as an outpatient if needed. - Continue losartan 100 mg, carvedilol 25 mg BID, imdur. Hydralazine increased to 75 mg TID  5. HFpEF: LVEDP elevated on cath. Increased lasix to 40mg  daily.   6. Frequent PVCs: improved. Continue carvedilol 25 mg BID   For questions or updates, please contact [place] Please consult [email or web address] for contact info under   Signed, Reino Bellis, NP  01/27/2019, 10:31 AM    I have personally seen and examined this patient. I agree with the assessment and plan as outlined above.  He is post cath. No chest pain. Cardiac cath with stable CAD. Plan to continue ASA, Plavix, Coreg, Imdur and Crestor. Can be discharged home today if ok with primary team. We will arrange an outpatient cardiac monitor and follow up in our office.   Lauree Chandler 01/27/2019 11:09 AM

## 2019-02-11 ENCOUNTER — Telehealth: Payer: Self-pay

## 2019-02-11 NOTE — Telephone Encounter (Signed)
Spoke with pt, gave brief instructions on Monitor. Verified address. Ordered 30 day Event monitor to be delivered to pt's home.

## 2019-02-19 ENCOUNTER — Ambulatory Visit (INDEPENDENT_AMBULATORY_CARE_PROVIDER_SITE_OTHER): Payer: Medicare Other

## 2019-02-19 DIAGNOSIS — R002 Palpitations: Secondary | ICD-10-CM

## 2019-02-24 NOTE — Progress Notes (Signed)
Cardiology Office Note   Date:  02/25/2019   ID:  James Maxwell, James Maxwell 11-23-1950, MRN [id number]  PCP:  James David, MD    No chief complaint on file.  CAD  Wt Readings from Last 3 Encounters:  02/25/19 279 lb 6.4 oz (126.7 kg)  01/27/19 273 lb (123.8 kg)  01/23/19 263 lb (119.3 kg)       History of Present Illness: James Maxwell is a 68 y.o. male   with a hx of CAD, chronic diastolic heart failure, hypertension, diabetes, OSA.  I saw him many years ago at [facility] cardiology.  S/p POBA of the PLOM in 1998. BMS of the mid RCA in 2005 with repeat stenting of this segment in 2018 with DES, 3.5 x 28 mm [place]  He was admitted with [place] in 01/2019.  Cath showed:   Prox LAD lesion is 50% stenosed.  Prox Cx to Dist Cx lesion is 25% stenosed.  Previously placed Prox RCA to Mid RCA stent (unknown type) is widely patent.  Mid RCA lesion is 50% stenosed.  LV end diastolic pressure is mildly elevated.   1. Moderate nonobstructive CAD. Lesions in the proximal LAD and mid RCA are unchanged from 2018. Stents in the RCA are still patent 2. Elevated LVEDP 23 mm Hg  Plan: recommend medical management. Will increase lasix to 40 mg daily." Cardiac monitor suggested at discharge as well.  Denies : Chest pain. Dizziness. Leg edema. Nitroglycerin use. Orthopnea. Palpitations. Paroxysmal nocturnal dyspnea. Shortness of breath. Syncope.   He has felt well since leaving the hospital.  He has had high BP readings at home.  He is trying to walk more.     Past Medical History:  Diagnosis Date  . Arthritis   . Chronic diastolic CHF (congestive heart failure) ([place])    a. 04/2014 Echo: EF 55%, Gr1 DD.  James Maxwell COPD (chronic obstructive pulmonary disease) ([place])   . Coronary artery disease    a. 2006 s/p PCI RCA;  b. 2012 MV: no ischemia.  . Diabetes mellitus without complication ([place])   . GERD (gastroesophageal reflux disease)   . History of gout   . Hyperlipidemia   . Hypertension   .  MI, old 25  . Sleep apnea   . Stroke ([place])   . SVT (supraventricular tachycardia) ([facility])     Past Surgical History:  Procedure Laterality Date  . ABDOMINAL SURGERY     [facility] 1970'S  . CARDIAC CATHETERIZATION  01/27/2019  . CATARACT EXTRACTION, BILATERAL    . CIRCUMCISION N/A 03/07/2018   Procedure: CIRCUMCISION ADULT;  Surgeon: Billey Co, MD;  Location: [facility] ORS;  Service: Urology;  Laterality: N/A;  . COLONOSCOPY WITH PROPOFOL N/A 10/25/2017   Procedure: COLONOSCOPY WITH PROPOFOL;  Surgeon: James Ada, MD;  Location: [facility] ENDOSCOPY;  Service: Endoscopy;  Laterality: N/A;  . CORONARY ANGIOGRAPHY N/A 01/25/2017   Procedure: CORONARY ANGIOGRAPHY;  Surgeon: James David, MD;  Location: [place] [facility] LAB;  Service: Cardiovascular;  Laterality: N/A;  . CORONARY STENT INTERVENTION N/A 01/25/2017   Procedure: CORONARY STENT INTERVENTION;  Surgeon: Yolonda Kida, MD;  Location: [place] [facility];  Service: Cardiovascular;  Laterality: N/A;  . CORONARY STENT PLACEMENT  2004  . JOINT REPLACEMENT     RT TOTAL KNEE  . JOINT REPLACEMENT     LT TKR  . LEFT HEART CATH Left 01/25/2017   Procedure: Left Heart Cath;  Surgeon: James David, MD;  Location:  [place] [facility] LAB;  Service: Cardiovascular;  Laterality: Left;  . LEFT HEART CATH AND CORONARY ANGIOGRAPHY N/A 01/27/2019   Procedure: LEFT HEART CATH AND CORONARY ANGIOGRAPHY;  Surgeon: Maxwell, James M, MD;  Location: [place] [facility] LAB;  Service: Cardiovascular;  Laterality: N/A;  . POLYPECTOMY  10/25/2017   Procedure: POLYPECTOMY;  Surgeon: James Ada, MD;  Location: [facility];  Service: Endoscopy;;  . REVISION TOTAL KNEE ARTHROPLASTY     RT KNEE  . TONSILLECTOMY    . TOTAL KNEE ARTHROPLASTY Left 07/20/2013   Procedure: LEFT TOTAL KNEE ARTHROPLASTY;  Surgeon: James Alf, MD;  Location: [facility]S;  Service: Orthopedics;  Laterality: Left;     Current Outpatient Medications  Medication Sig Dispense Refill  . ACCU-CHEK  AVIVA PLUS test strip     . ACCU-CHEK SOFTCLIX LANCETS lancets     . albuterol (PROVENTIL HFA;VENTOLIN HFA) 108 (90 Base) MCG/ACT inhaler Inhale 1-2 puffs into the lungs every 4 (four) hours as needed for wheezing or shortness of breath. (Patient taking differently: Inhale 2 puffs into the lungs every 4 (four) hours as needed for wheezing or shortness of breath. ) 1 Inhaler 0  . albuterol (PROVENTIL) (2.5 MG/3ML) 0.083% nebulizer solution Take 3 mLs (2.5 mg total) by nebulization every 4 (four) hours as needed for wheezing or shortness of breath. 75 mL 0  . Alcohol Swabs (B-D SINGLE USE SWABS REGULAR) PADS     . amiodarone (PACERONE) 200 MG tablet Take 200 mg by mouth daily.    . Ascorbic Acid (VITAMIN C) 1000 MG tablet Take 1,000 mg by mouth daily.    James Maxwell aspirin EC 81 MG tablet Take 81 mg by mouth daily.    . carvedilol (COREG) 25 MG tablet Take 25 mg by mouth 2 (two) times daily with a meal.    . cetirizine (ZYRTEC) 10 MG tablet Take 10 mg by mouth daily.    . Cholecalciferol (VITAMIN D3) 5000 units CAPS Take 5,000 Units by mouth daily.    . clopidogrel (PLAVIX) 75 MG tablet Take 75 mg by mouth daily.    . fluticasone (FLONASE) 50 MCG/ACT nasal spray Place 1 spray into both nostrils daily as needed for allergies.     . furosemide (LASIX) 40 MG tablet Take 1 tablet (40 mg total) by mouth daily. 30 tablet 0  . hydrALAZINE (APRESOLINE) 25 MG tablet Take 3 tablets (75 mg total) by mouth every 8 (eight) hours. 90 tablet 0  . insulin aspart (FIASP) 100 UNIT/ML injection Inject 3-15 Units into the skin 3 (three) times daily before meals.     . Insulin Glargine (LANTUS SOLOSTAR) 100 UNIT/ML Solostar Pen Inject 95 Units into the skin at bedtime.     . isosorbide mononitrate (IMDUR) 60 MG 24 hr tablet Take 1 tablet (60 mg total) by mouth daily. 30 tablet 0  . losartan (COZAAR) 100 MG tablet Take 100 mg by mouth daily.    . metFORMIN (GLUCOPHAGE) 1000 MG tablet Take 1 tablet (1,000 mg total) by mouth 2  (two) times daily. Restart on 01/30/2019 60 tablet 0  . Multiple Vitamins-Minerals (ZINC PO) Take 1 capsule by mouth daily.    . nitroGLYCERIN (NITROSTAT) 0.4 MG SL tablet Place 1 tablet (0.4 mg total) under the tongue every 5 (five) minutes as needed for chest pain. 25 tablet 5  . Omega-3 Fatty Acids (FISH OIL) 1000 MG CAPS Take 1,000 mg by mouth daily.    . pantoprazole (PROTONIX) 40 MG tablet Take 40  mg by mouth daily.    . rosuvastatin (CRESTOR) 20 MG tablet Take 20 mg by mouth daily.     No current facility-administered medications for this visit.     Allergies:   Lipitor [atorvastatin]    Social History:  The patient  reports that he quit smoking about 23 years ago. He has never used smokeless tobacco. He reports that he does not drink alcohol or use drugs.   Family History:  The patient's family history includes Cancer in his brother and brother; Diabetes in his mother; Heart attack in his brother, mother, and son; Hypertension in his mother, sister, and sister.    ROS:  Please see the history of present illness.   Otherwise, review of systems are positive for stress with the death of his granddaughter.   All other systems are reviewed and negative.    PHYSICAL EXAM: VS:  BP (!) 176/82   Pulse 68   Ht 5\' 9"  (1.753 m)   Wt 279 lb 6.4 oz (126.7 kg)   SpO2 96%   BMI 41.26 kg/m  , BMI Body mass index is 41.26 kg/m. GEN: Well nourished, well developed, in no acute distress  HEENT: normal  Neck: no JVD, carotid bruits, or masses Cardiac: RRR; no murmurs, rubs, or gallops,no edema  Respiratory:  clear to auscultation bilaterally, normal work of breathing GI: soft, nontender, nondistended, + BS MS: no deformity or atrophy  Skin: warm and dry, no rash Neuro:  Strength and sensation are intact Psych: euthymic mood, full affect    Recent Labs: 07/17/2018: B Natriuretic Peptide 79.0 01/24/2019: ALT 22 01/26/2019: BUN 12; Creatinine, Ser 1.13; Potassium 3.6; Sodium 138 01/27/2019:  Hemoglobin 13.0; Platelets 221   Lipid Panel    Component Value Date/Time   CHOL 193 04/13/2016 0424   TRIG 106 04/13/2016 0424   HDL 48 04/13/2016 0424   CHOLHDL 4.0 04/13/2016 0424   VLDL 21 04/13/2016 0424   LDLCALC 124 (H) 04/13/2016 0424     Other studies Reviewed: Additional studies/ records that were reviewed today with results demonstrating: cath results reviewed.   ASSESSMENT AND PLAN:  1. CAD: No angina. Continue aggressive secondary prevention. Lipids were checked with PMD.  Results not available now.  He will check about switching to a PMD here.  2. Chronic diastolic heart failure: appears euvolemic. 3. HTN: High readings. Start amlodipine 5 mg daily.  If after 3 days, BP > 140/90, would increase to 10 mg dialy.  4. DM: Healthy diet.  5. OSA: Using CPAP.  6. SVT: Noted on 11/18 ECG.  He had SVT at that time and has been on AMio.  Virl Axe his young age, I would recommend that he sees EP and have an ablation, rather than continue with lifelong amiodarone. Per his report,  Amio Started by Dr. Laurelyn Sickle for the SVT in 2018.    Current medicines are reviewed at length with the patient today.  The patient concerns regarding his medicines were addressed.  The following changes have been made:  Start amlodipine  Labs/ tests ordered today include:  No orders of the defined types were placed in this encounter.   Recommend 150 minutes/week of aerobic exercise Low fat, low carb, high fiber diet recommended  Disposition:   FU with EP, 1 year with me;  He will also need a PMD.  Will give a list of PCPs.   Signed, Larae Grooms, MD  02/25/2019 4:55 PM    [place] [phone]  [address], [place], [place]  [address] Phone: [phone]; Fax: [phone]

## 2019-02-25 ENCOUNTER — Encounter: Payer: Self-pay | Admitting: Interventional Cardiology

## 2019-02-25 ENCOUNTER — Other Ambulatory Visit: Payer: Self-pay

## 2019-02-25 ENCOUNTER — Ambulatory Visit: Payer: Medicare Other | Admitting: Interventional Cardiology

## 2019-02-25 VITALS — BP 176/82 | HR 68 | Ht 69.0 in | Wt 279.4 lb

## 2019-02-25 DIAGNOSIS — Z794 Long term (current) use of insulin: Secondary | ICD-10-CM

## 2019-02-25 DIAGNOSIS — I471 Supraventricular tachycardia: Secondary | ICD-10-CM

## 2019-02-25 DIAGNOSIS — E1159 Type 2 diabetes mellitus with other circulatory complications: Secondary | ICD-10-CM

## 2019-02-25 DIAGNOSIS — G4733 Obstructive sleep apnea (adult) (pediatric): Secondary | ICD-10-CM

## 2019-02-25 DIAGNOSIS — I25118 Atherosclerotic heart disease of native coronary artery with other forms of angina pectoris: Secondary | ICD-10-CM

## 2019-02-25 DIAGNOSIS — I10 Essential (primary) hypertension: Secondary | ICD-10-CM

## 2019-02-25 DIAGNOSIS — I5032 Chronic diastolic (congestive) heart failure: Secondary | ICD-10-CM

## 2019-02-25 MED ORDER — AMLODIPINE BESYLATE 10 MG PO TABS: 10.0000 mg | ORAL_TABLET | Freq: Every day | ORAL | 3 refills | Status: DC

## 2019-02-25 NOTE — Patient Instructions (Signed)
Medication Instructions:  Your physician has recommended you make the following change in your medication:   START: amlodipine 10 mg tablet: Take 1/2 tablet (5 mg) once a day for 4 days and then increase to 1 full tablet by mouth once a day if your blood pressure is still elevated   Lab work: None Ordered  If you have labs (blood work) drawn today and your tests are completely normal, you will receive your results only by: [name] MyChart Message (if you have MyChart) OR . A paper copy in the mail If you have any lab test that is abnormal or we need to change your treatment, we will call you to review the results.  Testing/Procedures: None ordered  Follow-Up: You have been referred to Electrophysiology for SVT  At [facility], you and your health needs are our priority.  As part of our continuing mission to provide you with exceptional heart care, we have created designated Provider Care Teams.  These Care Teams include your primary Cardiologist (physician) and Advanced Practice Providers (APPs -  Physician Assistants and Nurse Practitioners) who all work together to provide you with the care you need, when you need it. . You will need a follow up appointment in 6 months.  Please call our office 2 months in advance to schedule this appointment.  You may see Casandra Doffing, MD or one of the following Advanced Practice Providers on your designated Care Team:   . Lyda Jester, PA-C . Dayna Dunn, PA-C . Ermalinda Barrios, PA-C  Any Other Special Instructions Will Be Listed Below (If Applicable).

## 2019-03-20 ENCOUNTER — Encounter: Payer: Self-pay | Admitting: Internal Medicine

## 2019-03-20 ENCOUNTER — Ambulatory Visit (INDEPENDENT_AMBULATORY_CARE_PROVIDER_SITE_OTHER): Payer: Medicare Other | Admitting: Internal Medicine

## 2019-03-20 ENCOUNTER — Other Ambulatory Visit: Payer: Self-pay

## 2019-03-20 VITALS — BP 142/70 | HR 70 | Ht 69.0 in | Wt 276.2 lb

## 2019-03-20 DIAGNOSIS — I471 Supraventricular tachycardia: Secondary | ICD-10-CM | POA: Diagnosis not present

## 2019-03-20 NOTE — Patient Instructions (Addendum)
Medication Instructions:  Your physician has recommended you make the following change in your medication:   STOP taking your amiodarone  Labwork: None ordered.  Testing/Procedures: None ordered.  Follow-Up:  The following dates are available in December for your ablation:  December 2, 4, 9, 10   Any Other Special Instructions Will Be Listed Below (If Applicable).  If you need a refill on your cardiac medications before your next appointment, please call your pharmacy.    Cardiac Ablation Cardiac ablation is a procedure to disable (ablate) a small amount of heart tissue in very specific places. The heart has many electrical connections. Sometimes these connections are abnormal and can cause the heart to beat very fast or irregularly. Ablating some of the problem areas can improve the heart rhythm or return it to normal. Ablation may be done for people who:  Have Wolff-Parkinson-White syndrome.  Have fast heart rhythms (tachycardia).  Have taken medicines for an abnormal heart rhythm (arrhythmia) that were not effective or caused side effects.  Have a high-risk heartbeat that may be life-threatening. During the procedure, a small incision is made in the neck or the groin, and a long, thin, flexible tube (catheter) is inserted into the incision and moved to the heart. Small devices (electrodes) on the tip of the catheter will send out electrical currents. A type of X-ray (fluoroscopy) will be used to help guide the catheter and to provide images of the heart. Tell a health care provider about:  Any allergies you have.  All medicines you are taking, including vitamins, herbs, eye drops, creams, and over-the-counter medicines.  Any problems you or family members have had with anesthetic medicines.  Any blood disorders you have.  Any surgeries you have had.  Any medical conditions you have, such as kidney failure.  Whether you are pregnant or may be pregnant. What are the  risks? Generally, this is a safe procedure. However, problems may occur, including:  Infection.  Bruising and bleeding at the catheter insertion site.  Bleeding into the chest, especially into the sac that surrounds the heart. This is a serious complication.  Stroke or blood clots.  Damage to other structures or organs.  Allergic reaction to medicines or dyes.  Need for a permanent pacemaker if the normal electrical system is damaged. A pacemaker is a small computer that sends electrical signals to the heart and helps your heart beat normally.  The procedure not being fully effective. This may not be recognized until months later. Repeat ablation procedures are sometimes required. What happens before the procedure?  Follow instructions from your health care provider about eating or drinking restrictions.  Ask your health care provider about: ? Changing or stopping your regular medicines. This is especially important if you are taking diabetes medicines or blood thinners. ? Taking medicines such as aspirin and ibuprofen. These medicines can thin your blood. Do not take these medicines before your procedure if your health care provider instructs you not to.  Plan to have someone take you home from the hospital or clinic.  If you will be going home right after the procedure, plan to have someone with you for 24 hours. What happens during the procedure?  To lower your risk of infection: ? Your health care team will wash or sanitize their hands. ? Your skin will be washed with soap. ? Hair may be removed from the incision area.  An IV tube will be inserted into one of your veins.  You will be given  a medicine to help you relax (sedative).  The skin on your neck or groin will be numbed.  An incision will be made in your neck or your groin.  A needle will be inserted through the incision and into a large vein in your neck or groin.  A catheter will be inserted into the needle  and moved to your heart.  Dye may be injected through the catheter to help your surgeon see the area of the heart that needs treatment.  Electrical currents will be sent from the catheter to ablate heart tissue in desired areas. There are three types of energy that may be used to ablate heart tissue: ? Heat (radiofrequency energy). ? Laser energy. ? Extreme cold (cryoablation).  When the necessary tissue has been ablated, the catheter will be removed.  Pressure will be held on the catheter insertion area to prevent excessive bleeding.  A bandage (dressing) will be placed over the catheter insertion area. The procedure may vary among health care providers and hospitals. What happens after the procedure?  Your blood pressure, heart rate, breathing rate, and blood oxygen level will be monitored until the medicines you were given have worn off.  Your catheter insertion area will be monitored for bleeding. You will need to lie still for a few hours to ensure that you do not bleed from the catheter insertion area.  Do not drive for 24 hours or as long as directed by your health care provider. Summary  Cardiac ablation is a procedure to disable (ablate) a small amount of heart tissue in very specific places. Ablating some of the problem areas can improve the heart rhythm or return it to normal.  During the procedure, electrical currents will be sent from the catheter to ablate heart tissue in desired areas. This information is not intended to replace advice given to you by your health care provider. Make sure you discuss any questions you have with your health care provider. Document Released: 09/23/2008 Document Revised: 10/28/2017 Document Reviewed: 03/26/2016 Elsevier Patient Education  2020 Elsevier Inc[employer]  
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
## 2019-03-24 ENCOUNTER — Telehealth: Payer: Self-pay | Admitting: Internal Medicine

## 2019-03-24 DIAGNOSIS — I471 Supraventricular tachycardia: Secondary | ICD-10-CM

## 2019-03-24 NOTE — Telephone Encounter (Signed)
Follow Up:   Pt says he have picked 04-29-19 to do his procedure.

## 2019-03-25 NOTE — Telephone Encounter (Signed)
Call placed to Pt.  Confirmed SVT ablation on 04/29/2019.  Advised would call on Friday afternoon to confirm work up.

## 2019-03-30 NOTE — Telephone Encounter (Signed)
Completed work up.  Pt states he is closer to [facility] for labs/covid test.  Pt scheduled for labs and covid test and letter will be mailed to Pt today with further instructions.

## 2019-04-24 ENCOUNTER — Other Ambulatory Visit: Payer: Self-pay

## 2019-04-24 ENCOUNTER — Other Ambulatory Visit: Payer: Medicare Other | Admitting: *Deleted

## 2019-04-24 ENCOUNTER — Other Ambulatory Visit (HOSPITAL_COMMUNITY)
Admission: RE | Admit: 2019-04-24 | Discharge: 2019-04-24 | Disposition: A | Discharge status: Home / Self Care | Payer: Medicare Other | Source: Ambulatory Visit | Attending: Internal Medicine | Admitting: Internal Medicine

## 2019-04-24 ENCOUNTER — Other Ambulatory Visit: Payer: Medicare Other

## 2019-04-24 DIAGNOSIS — Z01812 Encounter for preprocedural laboratory examination: Secondary | ICD-10-CM | POA: Insufficient documentation

## 2019-04-24 DIAGNOSIS — Z20828 Contact with and (suspected) exposure to other viral communicable diseases: Secondary | ICD-10-CM | POA: Diagnosis not present

## 2019-04-24 DIAGNOSIS — I471 Supraventricular tachycardia: Secondary | ICD-10-CM

## 2019-04-24 LAB — BASIC METABOLIC PANEL
BUN/Creatinine Ratio: 9 — ABNORMAL LOW (ref 10–24)
BUN: 9 mg/dL (ref 8–27)
CO2: 24 mmol/L (ref 20–29)
Calcium: 9.5 mg/dL (ref 8.6–10.2)
Chloride: 99 mmol/L (ref 96–106)
Creatinine, Ser: 1.01 mg/dL (ref 0.76–1.27)
GFR calc Af Amer: 88 mL/min/{1.73_m2} (ref >59)
GFR calc non Af Amer: 76 mL/min/{1.73_m2} (ref >59)
Glucose: 134 mg/dL — ABNORMAL HIGH (ref 65–99)
Potassium: 4.1 mmol/L (ref 3.5–5.2)
Sodium: 142 mmol/L (ref 134–144)

## 2019-04-24 LAB — CBC WITH DIFFERENTIAL/PLATELET
Basophils Absolute: 0.1 10*3/uL (ref 0.0–0.2)
Basos: 1 %
EOS (ABSOLUTE): 0.2 10*3/uL (ref 0.0–0.4)
Eos: 2 %
Hematocrit: 44.5 % (ref 37.5–51.0)
Hemoglobin: 14.4 g/dL (ref 13.0–17.7)
Immature Grans (Abs): 0 10*3/uL (ref 0.0–0.1)
Immature Granulocytes: 0 %
Lymphocytes Absolute: 3.1 10*3/uL (ref 0.7–3.1)
Lymphs: 36 %
MCH: 28.2 pg (ref 26.6–33.0)
MCHC: 32.4 g/dL (ref 31.5–35.7)
MCV: 87 fL (ref 79–97)
Monocytes Absolute: 0.5 10*3/uL (ref 0.1–0.9)
Monocytes: 6 %
Neutrophils Absolute: 4.8 10*3/uL (ref 1.4–7.0)
Neutrophils: 55 %
Platelets: 256 10*3/uL (ref 150–450)
RBC: 5.11 x10E6/uL (ref 4.14–5.80)
RDW: 13.4 % (ref 11.6–15.4)
WBC: 8.7 10*3/uL (ref 3.4–10.8)

## 2019-04-25 LAB — NOVEL CORONAVIRUS, NAA (HOSP ORDER, SEND-OUT TO REF LAB; TAT 18-24 HRS): SARS-CoV-2, NAA: NOT DETECTED

## 2019-04-29 ENCOUNTER — Other Ambulatory Visit: Payer: Self-pay

## 2019-04-29 ENCOUNTER — Encounter (HOSPITAL_COMMUNITY): Payer: Self-pay | Admitting: Internal Medicine

## 2019-04-29 ENCOUNTER — Encounter (HOSPITAL_COMMUNITY)
Admission: RE | Disposition: A | Discharge status: Home / Self Care | Payer: Self-pay | Source: Home / Self Care | Attending: Internal Medicine

## 2019-04-29 ENCOUNTER — Ambulatory Visit (HOSPITAL_COMMUNITY)
Admission: RE | Admit: 2019-04-29 | Discharge: 2019-04-29 | Disposition: A | Discharge status: Home / Self Care | Payer: Medicare Other | Attending: Internal Medicine | Admitting: Internal Medicine

## 2019-04-29 DIAGNOSIS — J449 Chronic obstructive pulmonary disease, unspecified: Secondary | ICD-10-CM | POA: Insufficient documentation

## 2019-04-29 DIAGNOSIS — Z79899 Other long term (current) drug therapy: Secondary | ICD-10-CM | POA: Insufficient documentation

## 2019-04-29 DIAGNOSIS — Z8249 Family history of ischemic heart disease and other diseases of the circulatory system: Secondary | ICD-10-CM | POA: Insufficient documentation

## 2019-04-29 DIAGNOSIS — M1990 Unspecified osteoarthritis, unspecified site: Secondary | ICD-10-CM | POA: Diagnosis not present

## 2019-04-29 DIAGNOSIS — Z87891 Personal history of nicotine dependence: Secondary | ICD-10-CM | POA: Insufficient documentation

## 2019-04-29 DIAGNOSIS — I2510 Atherosclerotic heart disease of native coronary artery without angina pectoris: Secondary | ICD-10-CM | POA: Diagnosis not present

## 2019-04-29 DIAGNOSIS — I252 Old myocardial infarction: Secondary | ICD-10-CM | POA: Diagnosis not present

## 2019-04-29 DIAGNOSIS — Z7982 Long term (current) use of aspirin: Secondary | ICD-10-CM | POA: Diagnosis not present

## 2019-04-29 DIAGNOSIS — Z955 Presence of coronary angioplasty implant and graft: Secondary | ICD-10-CM | POA: Insufficient documentation

## 2019-04-29 DIAGNOSIS — K219 Gastro-esophageal reflux disease without esophagitis: Secondary | ICD-10-CM | POA: Diagnosis not present

## 2019-04-29 DIAGNOSIS — Z6841 Body Mass Index (BMI) 40.0 and over, adult: Secondary | ICD-10-CM | POA: Insufficient documentation

## 2019-04-29 DIAGNOSIS — E119 Type 2 diabetes mellitus without complications: Secondary | ICD-10-CM | POA: Insufficient documentation

## 2019-04-29 DIAGNOSIS — E669 Obesity, unspecified: Secondary | ICD-10-CM | POA: Insufficient documentation

## 2019-04-29 DIAGNOSIS — Z833 Family history of diabetes mellitus: Secondary | ICD-10-CM | POA: Insufficient documentation

## 2019-04-29 DIAGNOSIS — Z888 Allergy status to other drugs, medicaments and biological substances status: Secondary | ICD-10-CM | POA: Diagnosis not present

## 2019-04-29 DIAGNOSIS — Z7902 Long term (current) use of antithrombotics/antiplatelets: Secondary | ICD-10-CM | POA: Diagnosis not present

## 2019-04-29 DIAGNOSIS — Z794 Long term (current) use of insulin: Secondary | ICD-10-CM | POA: Insufficient documentation

## 2019-04-29 DIAGNOSIS — I471 Supraventricular tachycardia: Secondary | ICD-10-CM

## 2019-04-29 DIAGNOSIS — G4730 Sleep apnea, unspecified: Secondary | ICD-10-CM | POA: Diagnosis not present

## 2019-04-29 DIAGNOSIS — E785 Hyperlipidemia, unspecified: Secondary | ICD-10-CM | POA: Diagnosis not present

## 2019-04-29 DIAGNOSIS — I5032 Chronic diastolic (congestive) heart failure: Secondary | ICD-10-CM | POA: Diagnosis not present

## 2019-04-29 DIAGNOSIS — I110 Hypertensive heart disease with heart failure: Secondary | ICD-10-CM | POA: Insufficient documentation

## 2019-04-29 DIAGNOSIS — Z8673 Personal history of transient ischemic attack (TIA), and cerebral infarction without residual deficits: Secondary | ICD-10-CM | POA: Diagnosis not present

## 2019-04-29 HISTORY — PX: SVT ABLATION: EP1225

## 2019-04-29 LAB — GLUCOSE, CAPILLARY
Glucose-Capillary: 179 mg/dL — ABNORMAL HIGH (ref 70–99)
Glucose-Capillary: 134 mg/dL — ABNORMAL HIGH (ref 70–99)

## 2019-04-29 SURGERY — SVT ABLATION

## 2019-04-29 MED ORDER — BUPIVACAINE HCL (PF) 0.25 % IJ SOLN
INTRAMUSCULAR | Status: DC | PRN
  Administered 2019-04-29: 60 mL

## 2019-04-29 MED ORDER — HEPARIN (PORCINE) IN NACL 1000-0.9 UT/500ML-% IV SOLN
INTRAVENOUS | Status: AC
  Filled 2019-04-29: qty 500

## 2019-04-29 MED ORDER — SODIUM CHLORIDE 0.9% FLUSH: 3.0000 mL | INTRAVENOUS | Status: DC | PRN

## 2019-04-29 MED ORDER — MIDAZOLAM HCL 5 MG/5ML IJ SOLN
INTRAMUSCULAR | Status: DC | PRN
  Administered 2019-04-29: 2 mg via INTRAVENOUS
  Administered 2019-04-29 (×3): 1 mg via INTRAVENOUS
  Administered 2019-04-29: 2 mg via INTRAVENOUS
  Administered 2019-04-29: 1 mg via INTRAVENOUS

## 2019-04-29 MED ORDER — MIDAZOLAM HCL 5 MG/5ML IJ SOLN
INTRAMUSCULAR | Status: AC
  Filled 2019-04-29: qty 5

## 2019-04-29 MED ORDER — ACETAMINOPHEN 325 MG PO TABS
650.0000 mg | ORAL_TABLET | ORAL | Status: DC | PRN
  Filled 2019-04-29: qty 2

## 2019-04-29 MED ORDER — FENTANYL CITRATE (PF) 100 MCG/2ML IJ SOLN
INTRAMUSCULAR | Status: AC
  Filled 2019-04-29: qty 2

## 2019-04-29 MED ORDER — FENTANYL CITRATE (PF) 100 MCG/2ML IJ SOLN
INTRAMUSCULAR | Status: DC | PRN
  Administered 2019-04-29 (×2): 12.5 ug via INTRAVENOUS
  Administered 2019-04-29: 25 ug via INTRAVENOUS
  Administered 2019-04-29 (×2): 12.5 ug via INTRAVENOUS
  Administered 2019-04-29: 25 ug via INTRAVENOUS

## 2019-04-29 MED ORDER — SODIUM CHLORIDE 0.9 % IV SOLN: 250.0000 mL | INTRAVENOUS | Status: DC | PRN

## 2019-04-29 MED ORDER — HEPARIN (PORCINE) IN NACL 1000-0.9 UT/500ML-% IV SOLN
INTRAVENOUS | Status: DC | PRN
  Administered 2019-04-29: 500 mL

## 2019-04-29 MED ORDER — BUPIVACAINE HCL (PF) 0.25 % IJ SOLN
INTRAMUSCULAR | Status: AC
  Filled 2019-04-29: qty 60

## 2019-04-29 MED ORDER — ONDANSETRON HCL 4 MG/2ML IJ SOLN: 4.0000 mg | Freq: Four times a day (QID) | INTRAMUSCULAR | Status: DC | PRN

## 2019-04-29 MED ORDER — SODIUM CHLORIDE 0.9 % IV SOLN
INTRAVENOUS | Status: DC
  Administered 2019-04-29: 07:00:00 via INTRAVENOUS

## 2019-04-29 SURGICAL SUPPLY — 11 items
BAG SNAP BAND KOVER 36X36 (MISCELLANEOUS) ×3 IMPLANT
CATH CELSIUS THERM D CV 7F (ABLATOR) ×3 IMPLANT
CATH HEX JOS 2-5-2 65CM 6F REP (CATHETERS) ×3 IMPLANT
CATH JOSEPH QUAD ALLRED 6F REP (CATHETERS) ×6 IMPLANT
PACK EP LATEX FREE (CUSTOM PROCEDURE TRAY) ×2
PACK EP LF (CUSTOM PROCEDURE TRAY) ×1 IMPLANT
PAD PRO RADIOLUCENT 2001M-C (PAD) ×3 IMPLANT
SHEATH PINNACLE 6F 10CM (SHEATH) ×6 IMPLANT
SHEATH PINNACLE 7F 10CM (SHEATH) ×3 IMPLANT
SHEATH PINNACLE 8F 10CM (SHEATH) ×3 IMPLANT
SHIELD RADPAD SCOOP 12X17 (MISCELLANEOUS) ×3 IMPLANT
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
## 2019-04-29 NOTE — Discharge Instructions (Signed)
Post procedure care instructions No driving for 4 days. No lifting over 5 lbs for 1 week. No vigorous or sexual activity for 1 week. You may return to work/your usual activities on 05/06/2019. Keep procedure site clean & dry. If you notice increased pain, swelling, bleeding or pus, call/return!  You may shower, but no soaking baths/hot tubs/pools for 1 week.     Cardiac Ablation, Care After This sheet gives you information about how to care for yourself after your procedure. Your health care provider may also give you more specific instructions. If you have problems or questions, contact your health care provider. What can I expect after the procedure? After the procedure, it is common to have:  Bruising around your puncture site.  Tenderness around your puncture site.  Skipped heartbeats.  Tiredness (fatigue). Follow these instructions at home: Puncture site care   Follow instructions from your health care provider about how to take care of your puncture site. Make sure you: ? Wash your hands with soap and water before you change your bandage (dressing). If soap and water are not available, use hand sanitizer. ? Change your dressing as told by your health care provider. ? Leave stitches (sutures), skin glue, or adhesive strips in place. These skin closures may need to stay in place for up to 2 weeks. If adhesive strip edges start to loosen and curl up, you may trim the loose edges. Do not remove adhesive strips completely unless your health care provider tells you to do that.  Check your puncture site every day for signs of infection. Check for: ? Redness, swelling, or pain. ? Fluid or blood. If your puncture site starts to bleed, lie down on your back, apply firm pressure to the area, and contact your health care provider. ? Warmth. ? Pus or a bad smell. Driving  Ask your health care provider when it is safe for you to drive again after the procedure.  Do not drive or use heavy  machinery while taking prescription pain medicine.  Do not drive for 24 hours if you were given a medicine to help you relax (sedative) during your procedure. Activity  Avoid activities that take a lot of effort for at least 3 days after your procedure.  Do not lift anything that is heavier than 10 lb (4.5 kg), or the limit that you are told, until your health care provider says that it is safe.  Return to your normal activities as told by your health care provider. Ask your health care provider what activities are safe for you. General instructions  Take over-the-counter and prescription medicines only as told by your health care provider.  Do not use any products that contain nicotine or tobacco, such as cigarettes and e-cigarettes. If you need help quitting, ask your health care provider.  Do not take baths, swim, or use a hot tub until your health care provider approves.  Do not drink alcohol for 24 hours after your procedure.  Keep all follow-up visits as told by your health care provider. This is important. Contact a health care provider if:  You have redness, mild swelling, or pain around your puncture site.  You have fluid or blood coming from your puncture site that stops after applying firm pressure to the area.  Your puncture site feels warm to the touch.  You have pus or a bad smell coming from your puncture site.  You have a fever.  You have chest pain or discomfort that spreads to  your neck, jaw, or arm.  You are sweating a lot.  You feel nauseous.  You have a fast or irregular heartbeat.  You have shortness of breath.  You are dizzy or light-headed and feel the need to lie down.  You have pain or numbness in the arm or leg closest to your puncture site. Get help right away if:  Your puncture site suddenly swells.  Your puncture site is bleeding and the bleeding does not stop after applying firm pressure to the area. These symptoms may represent a  serious problem that is an emergency. Do not wait to see if the symptoms will go away. Get medical help right away. Call your local emergency services (911 in the [place] Do not drive yourself to the hospital. Summary  After the procedure, it is normal to have bruising and tenderness at the puncture site in your groin, neck, or forearm.  Check your puncture site every day for signs of infection.  Get help right away if your puncture site is bleeding and the bleeding does not stop after applying firm pressure to the area. This is a medical emergency. This information is not intended to replace advice given to you by your health care provider. Make sure you discuss any questions you have with your health care provider. Document Released: 08/16/2016 Document Revised: 04/19/2017 Document Reviewed: 08/16/2016 Elsevier Patient Education  2020 [employer].

## 2019-04-29 NOTE — Progress Notes (Signed)
Site area: Right  Internal Jugular a 7 french venous sheath was remover by [employer] Prior to Removal:  Level 0  Pressure Applied For 10 MINUTES    Bedrest Beginning at 1040am  Manual:   Yes.    Patient Status During Pull:  stable  Post Pull Groin Site:  Level 0  Post Pull Instructions Given:  Yes.    Post Pull Pulses Present:  Yes.    Dressing Applied:  Yes.    Comments:

## 2019-04-29 NOTE — Progress Notes (Signed)
Site area: Right groin a 6 french X2 and 8 french venous sheath was removed  Site Prior to Removal:  Level 0  Pressure Applied For 15 MINUTES    Bestrest Beginning at 1040am  Manual:   Yes.    Patient Status During Pull:  stable  Post Pull Groin Site:  Level 0  Post Pull Instructions Given:  Yes.    Post Pull Pulses Present:  Yes.    Dressing Applied:  Yes.    Comments:

## 2019-04-30 NOTE — Progress Notes (Addendum)
Same Day PCI D/C Phone Call   [x]   1st call         [x]   Answer.         [x]   No Answer    []   2nd call        []   Answer       []   No Answer   Is there bleeding or other abnormality from access site?  []   Yes    [x]   No  Has there been any chest pain, nausea or vomiting?    [x]   No  []   Yes chest pain   []   Yes nausea or vomiting  Have you sought any medical attention since discharge?    []   Yes  [x]   No  Remind Patient about their follow up appointment, review discharge & medication instructions.   [x]   Yes  []   No  Are you continuing to take you daily ASA & antiplatlet inhibitor(Plavix, Effient, Brilinitia)?  []   Yes  []   No  Would you say you were satisfied with you Same Day Discharge?  (Disagree, Neutral, Agree)   []   Disagree, why  []   Neutral  [x]   Agree

## 2019-06-02 ENCOUNTER — Ambulatory Visit: Payer: Medicare Other | Admitting: Internal Medicine

## 2019-07-24 ENCOUNTER — Encounter: Payer: Self-pay | Admitting: Internal Medicine

## 2019-07-24 ENCOUNTER — Ambulatory Visit: Payer: Medicare Other | Admitting: Internal Medicine

## 2019-07-24 ENCOUNTER — Other Ambulatory Visit: Payer: Self-pay

## 2019-07-24 VITALS — BP 144/72 | HR 71 | Ht 69.0 in | Wt 272.4 lb

## 2019-07-24 DIAGNOSIS — I10 Essential (primary) hypertension: Secondary | ICD-10-CM | POA: Diagnosis not present

## 2019-07-24 DIAGNOSIS — I471 Supraventricular tachycardia, unspecified: Secondary | ICD-10-CM

## 2019-07-24 DIAGNOSIS — I25119 Atherosclerotic heart disease of native coronary artery with unspecified angina pectoris: Secondary | ICD-10-CM

## 2019-07-24 NOTE — Patient Instructions (Signed)
Medication Instructions:  Your physician recommends that you continue on your current medications as directed. Please refer to the Current Medication list given to you today.  Labwork: None ordered.  Testing/Procedures: None ordered.  Follow-Up: Your physician wants you to follow-up in: as needed with Dr. Ulatowski.      Any Other Special Instructions Will Be Listed Below (If Applicable).  If you need a refill on your cardiac medications before your next appointment, please call your pharmacy.   

## 2019-07-24 NOTE — Progress Notes (Signed)
HPI Mr. Lettieri returns today for followup. He is a pleasant 69 yo man with AVNRT who underwent EP study and catheter ablation about 4 weeks ago. He has done well in the interim with no recurrent symptoms. He denies chest pain or sob. No edema.  Allergies  Allergen Reactions  . Lipitor [Atorvastatin]     myalgias     Current Outpatient Medications  Medication Sig Dispense Refill  . ACCU-CHEK AVIVA PLUS test strip     . ACCU-CHEK SOFTCLIX LANCETS lancets     . albuterol (PROVENTIL HFA;VENTOLIN HFA) 108 (90 Base) MCG/ACT inhaler Inhale 1-2 puffs into the lungs every 4 (four) hours as needed for wheezing or shortness of breath. 1 Inhaler 0  . albuterol (PROVENTIL) (2.5 MG/3ML) 0.083% nebulizer solution Take 3 mLs (2.5 mg total) by nebulization every 4 (four) hours as needed for wheezing or shortness of breath. 75 mL 0  . Alcohol Swabs (B-D SINGLE USE SWABS REGULAR) PADS     . aspirin EC 81 MG tablet Take 81 mg by mouth daily.    . carvedilol (COREG) 25 MG tablet Take 25 mg by mouth 2 (two) times daily with a meal.    . Cholecalciferol (VITAMIN D3) 5000 units CAPS Take 5,000 Units by mouth daily.    . clopidogrel (PLAVIX) 75 MG tablet Take 75 mg by mouth daily.    [name] diltiazem (TIAZAC) 120 MG 24 hr capsule Take 1 capsule by mouth daily.    . fluticasone (FLONASE) 50 MCG/ACT nasal spray Place 1 spray into both nostrils daily as needed for allergies.     . furosemide (LASIX) 20 MG tablet Take 40 mg by mouth daily.    [name] glimepiride (AMARYL) 4 MG tablet Take 4 mg by mouth daily.    . hydrALAZINE (APRESOLINE) 50 MG tablet Take 50 mg by mouth 2 (two) times daily.    . Insulin Aspart, w/Niacinamide, (FIASP FLEXTOUCH) 100 UNIT/ML SOPN Inject 0-9 Units into the skin 3 (three) times daily with meals. Sliding Scale Insulin    . Insulin Glargine (LANTUS SOLOSTAR) 100 UNIT/ML Solostar Pen Inject 95 Units into the skin at bedtime.     . isosorbide mononitrate (IMDUR) 30 MG 24 hr tablet Take 30 mg by  mouth daily.    Javier Docker Oil (OMEGA-3) 500 MG CAPS Take 500 mg by mouth daily.    [name] losartan (COZAAR) 100 MG tablet Take 100 mg by mouth daily.    [name] MAXITROL 3.5-10000-0.1 OINT Place 1 application into the right eye 2 (two) times daily.    . metFORMIN (GLUCOPHAGE) 1000 MG tablet Take 1 tablet (1,000 mg total) by mouth 2 (two) times daily. Restart on 01/30/2019 60 tablet 0  . nitroGLYCERIN (NITROSTAT) 0.4 MG SL tablet Place 1 tablet (0.4 mg total) under the tongue every 5 (five) minutes as needed for chest pain. 25 tablet 5  . pantoprazole (PROTONIX) 40 MG tablet Take 40 mg by mouth daily.    . rosuvastatin (CRESTOR) 20 MG tablet Take 20 mg by mouth daily.    . vitamin C (ASCORBIC ACID) 500 MG tablet Take 500 mg by mouth daily.    . vitamin E (VITAMIN E) 400 UNIT capsule Take 400 Units by mouth daily.    . Zinc 50 MG TABS Take 50 mg by mouth daily.    [name] amLODipine (NORVASC) 10 MG tablet Take 1 tablet (10 mg total) by mouth daily. 180 tablet 3   No current facility-administered medications for  this visit.     Past Medical History:  Diagnosis Date  . Arthritis   . Chronic diastolic CHF (congestive heart failure) ([place])    a. 04/2014 Echo: EF 55%, Gr1 DD.  [name] COPD (chronic obstructive pulmonary disease) ([place])   . Coronary artery disease    a. 2006 s/p PCI RCA;  b. 2012 MV: no ischemia.  . Diabetes mellitus without complication ([place])   . GERD (gastroesophageal reflux disease)   . History of gout   . Hyperlipidemia   . Hypertension   . MI, old 83  . Sleep apnea   . Stroke ([place])   . SVT (supraventricular tachycardia) ([facility])     ROS:   All systems reviewed and negative except as noted in the HPI.   Past Surgical History:  Procedure Laterality Date  . ABDOMINAL SURGERY     GS[facility] 1970'S  . CARDIAC CATHETERIZATION  01/27/2019  . CATARACT EXTRACTION, BILATERAL    . CIRCUMCISION N/A 03/07/2018   Procedure: CIRCUMCISION ADULT;  Surgeon: Billey Co, MD;  Location: [facility];   Service: Urology;  Laterality: N/A;  . COLONOSCOPY WITH PROPOFOL N/A 10/25/2017   Procedure: COLONOSCOPY WITH PROPOFOL;  Surgeon: Carol Ada, MD;  Location: [facility];  Service: Endoscopy;  Laterality: N/A;  . CORONARY ANGIOGRAPHY N/A 01/25/2017   Procedure: CORONARY ANGIOGRAPHY;  Surgeon: Dionisio David, MD;  Location: [place] [facility];  Service: Cardiovascular;  Laterality: N/A;  . CORONARY STENT INTERVENTION N/A 01/25/2017   Procedure: CORONARY STENT INTERVENTION;  Surgeon: Yolonda Kida, MD;  Location: [place] [facility];  Service: Cardiovascular;  Laterality: N/A;  . CORONARY STENT PLACEMENT  2004  . JOINT REPLACEMENT     RT TOTAL KNEE  . JOINT REPLACEMENT     LT TKR  . LEFT HEART CATH Left 01/25/2017   Procedure: Left Heart Cath;  Surgeon: Dionisio David, MD;  Location: [place] [facility];  Service: Cardiovascular;  Laterality: Left;  . LEFT HEART CATH AND CORONARY ANGIOGRAPHY N/A 01/27/2019   Procedure: LEFT HEART CATH AND CORONARY ANGIOGRAPHY;  Surgeon: Martinique, [name], MD;  Location: [place] [facility];  Service: Cardiovascular;  Laterality: N/A;  . POLYPECTOMY  10/25/2017   Procedure: POLYPECTOMY;  Surgeon: Carol Ada, MD;  Location: [facility];  Service: Endoscopy;;  . REVISION TOTAL KNEE ARTHROPLASTY     RT KNEE  . SVT ABLATION N/A 04/29/2019   Procedure: SVT ABLATION;  Surgeon: Evans Lance, MD;  Location: [place] [facility];  Service: Cardiovascular;  Laterality: N/A;  . TONSILLECTOMY    . TOTAL KNEE ARTHROPLASTY Left 07/20/2013   Procedure: LEFT TOTAL KNEE ARTHROPLASTY;  Surgeon: Gearlean Alf, MD;  Location: [facility];  Service: Orthopedics;  Laterality: Left;     Family History  Problem Relation Age of Onset  . Diabetes Mother   . Hypertension Mother   . Heart attack Mother   . Hypertension Sister   . Cancer Brother   . Heart attack Son   . Cancer Brother   . Heart attack Brother   . Hypertension Sister      Social History   Socioeconomic  History  . Marital status: Married    Spouse name: Not on file  . Number of children: Not on file  . Years of education: Not on file  . Highest education level: Not on file  Occupational History  . Not on file  Tobacco Use  . Smoking status: Former Audiological scientist  date: 07/16/1995    Years since quitting: 24.0  . Smokeless tobacco: Never Used  Substance and Sexual Activity  . Alcohol use: No  . Drug use: No  . Sexual activity: Yes  Other Topics Concern  . Not on file  Social History Narrative   Lives in [place] with [place].  Does not routinely exercise.   Social Determinants of Health   Financial Resource Strain:   . Difficulty of Paying Living Expenses: Not on file  Food Insecurity:   . Worried About Charity fundraiser in the Last Year: Not on file  . Ran Out of Food in the Last Year: Not on file  Transportation Needs:   . Lack of Transportation (Medical): Not on file  . Lack of Transportation (Non-Medical): Not on file  Physical Activity:   . Days of Exercise per Week: Not on file  . Minutes of Exercise per Session: Not on file  Stress:   . Feeling of Stress : Not on file  Social Connections:   . Frequency of Communication with Friends and Family: Not on file  . Frequency of Social Gatherings with Friends and Family: Not on file  . Attends Religious Services: Not on file  . Active Member of Clubs or Organizations: Not on file  . Attends Archivist Meetings: Not on file  . Marital Status: Not on file  Intimate Partner Violence:   . Fear of Current or Ex-Partner: Not on file  . Emotionally Abused: Not on file  . Physically Abused: Not on file  . Sexually Abused: Not on file     BP (!) 144/72   Pulse 71   Ht 5\' 9"  (1.753 m)   Wt 272 lb 6.4 oz (123.6 kg)   SpO2 96%   BMI 40.23 kg/m   Physical Exam:  Well appearing NAD HEENT: Unremarkable Neck:  No JVD, no thyromegally Lymphatics:  No adenopathy Back:  No CVA tenderness Lungs:  Clear with  no wheezes HEART:  Regular rate rhythm, no murmurs, no rubs, no clicks Abd:  soft, positive bowel sounds, no organomegally, no rebound, no guarding Ext:  2 plus pulses, no edema, no cyanosis, no clubbing Skin:  No rashes no nodules Neuro:  CN II through XII intact, motor grossly intact  EKG - nsr  Assess/Plan: 1. SVT - he is s/p ablation of AVNRT. He will followup as needed. No change. 2. CAD - his is s/p RCA stenting remotely. He denies anginal symptoms.   Mikle Bosworth.D.
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
## 2019-08-31 ENCOUNTER — Other Ambulatory Visit: Payer: Self-pay

## 2019-08-31 ENCOUNTER — Encounter: Payer: Self-pay | Admitting: Interventional Cardiology

## 2019-08-31 ENCOUNTER — Ambulatory Visit: Payer: Medicare Other | Admitting: Interventional Cardiology

## 2019-08-31 VITALS — BP 110/60 | HR 71 | Ht 69.0 in | Wt 278.0 lb

## 2019-08-31 DIAGNOSIS — I5032 Chronic diastolic (congestive) heart failure: Secondary | ICD-10-CM | POA: Diagnosis not present

## 2019-08-31 DIAGNOSIS — I471 Supraventricular tachycardia: Secondary | ICD-10-CM

## 2019-08-31 DIAGNOSIS — I25119 Atherosclerotic heart disease of native coronary artery with unspecified angina pectoris: Secondary | ICD-10-CM

## 2019-08-31 DIAGNOSIS — E1159 Type 2 diabetes mellitus with other circulatory complications: Secondary | ICD-10-CM | POA: Diagnosis not present

## 2019-08-31 DIAGNOSIS — I10 Essential (primary) hypertension: Secondary | ICD-10-CM

## 2019-08-31 DIAGNOSIS — G4733 Obstructive sleep apnea (adult) (pediatric): Secondary | ICD-10-CM

## 2019-08-31 DIAGNOSIS — Z794 Long term (current) use of insulin: Secondary | ICD-10-CM

## 2019-08-31 MED ORDER — SILDENAFIL CITRATE 20 MG PO TABS: ORAL_TABLET | ORAL | 0 refills | Status: DC

## 2019-08-31 NOTE — Patient Instructions (Signed)
Medication Instructions:  Your physician has recommended you make the following change in your medication:   1. STOP: isosorbide mononitrate (imdur)  2. START: sildenafil 20 mg tablet: Take 3-5 tablets 1 hour prior to sexual activity AS NEEDED  *If you need a refill on your cardiac medications before your next appointment, please call your pharmacy*   Lab Work: Your physician recommends that you return for a FASTING lipid profile and liver function panel  If you have labs (blood work) drawn today and your tests are completely normal, you will receive your results only by: [name] MyChart Message (if you have MyChart) OR . A paper copy in the mail If you have any lab test that is abnormal or we need to change your treatment, we will call you to review the results.   Testing/Procedures: None ordered   Follow-Up: At [facility], you and your health needs are our priority.  As part of our continuing mission to provide you with exceptional heart care, we have created designated Provider Care Teams.  These Care Teams include your primary Cardiologist (physician) and Advanced Practice Providers (APPs -  Physician Assistants and Nurse Practitioners) who all work together to provide you with the care you need, when you need it.  We recommend signing up for the patient portal called "MyChart".  Sign up information is provided on this After Visit Summary.  MyChart is used to connect with patients for Virtual Visits (Telemedicine).  Patients are able to view lab/test results, encounter notes, upcoming appointments, etc.  Non-urgent messages can be sent to your provider as well.   To learn more about what you can do with MyChart, go to [email or web address].    Your next appointment:   6 month(s) In Person with Melina Copa, PA-C or Ermalinda Barrios, PA-C   12 months In Person with Dr. Irish Lack   Other Instructions  High-Fiber Diet Fiber, also called dietary fiber, is a type of carbohydrate that  is found in fruits, vegetables, whole grains, and beans. A high-fiber diet can have many health benefits. Your health care provider may recommend a high-fiber diet to help:  Prevent constipation. Fiber can make your bowel movements more regular.  Lower your cholesterol.  Relieve the following conditions: ? Swelling of veins in the anus (hemorrhoids). ? Swelling and irritation (inflammation) of specific areas of the digestive tract (uncomplicated diverticulosis). ? A problem of the large intestine (colon) that sometimes causes pain and diarrhea (irritable bowel syndrome, IBS).  Prevent overeating as part of a weight-loss plan.  Prevent heart disease, type 2 diabetes, and certain cancers. What is my plan? The recommended daily fiber intake in grams (g) includes:  38 g for men age 35 or younger.  30 g for men over age 26.  28 g for women age 16 or younger.  21 g for women over age 5. You can get the recommended daily intake of dietary fiber by:  Eating a variety of fruits, vegetables, grains, and beans.  Taking a fiber supplement, if it is not possible to get enough fiber through your diet. What do I need to know about a high-fiber diet?  It is better to get fiber through food sources rather than from fiber supplements. There is not a lot of research about how effective supplements are.  Always check the fiber content on the nutrition facts label of any prepackaged food. Look for foods that contain 5 g of fiber or more per serving.  Talk with a diet and nutrition  specialist (dietitian) if you have questions about specific foods that are recommended or not recommended for your medical condition, especially if those foods are not listed below.  Gradually increase how much fiber you consume. If you increase your intake of dietary fiber too quickly, you may have bloating, cramping, or gas.  Drink plenty of water. Water helps you to digest fiber. What are tips for following this  plan?  Eat a wide variety of high-fiber foods.  Make sure that half of the grains that you eat each day are whole grains.  Eat breads and cereals that are made with whole-grain flour instead of refined flour or white flour.  Eat brown rice, bulgur wheat, or millet instead of white rice.  Start the day with a breakfast that is high in fiber, such as a cereal that contains 5 g of fiber or more per serving.  Use beans in place of meat in soups, salads, and pasta dishes.  Eat high-fiber snacks, such as berries, raw vegetables, nuts, and popcorn.  Choose whole fruits and vegetables instead of processed forms like juice or sauce. What foods can I eat?  Fruits Berries. Pears. Apples. Oranges. Avocado. Prunes and raisins. Dried figs. Vegetables Sweet potatoes. Spinach. Kale. Artichokes. Cabbage. Broccoli. Cauliflower. Green peas. Carrots. Squash. Grains Whole-grain breads. Multigrain cereal. Oats and oatmeal. Brown rice. Barley. Bulgur wheat. Thiells. Quinoa. Bran muffins. Popcorn. Rye wafer crackers. Meats and other proteins Navy, kidney, and pinto beans. Soybeans. Split peas. Lentils. Nuts and seeds. Dairy Fiber-fortified yogurt. Beverages Fiber-fortified soy milk. Fiber-fortified orange juice. Other foods Fiber bars. The items listed above may not be a complete list of recommended foods and beverages. Contact a dietitian for more options. What foods are not recommended? Fruits Fruit juice. Cooked, strained fruit. Vegetables Fried potatoes. Canned vegetables. Well-cooked vegetables. Grains White bread. Pasta made with refined flour. White rice. Meats and other proteins Fatty cuts of meat. Fried chicken or fried fish. Dairy Milk. Yogurt. Cream cheese. Sour cream. Fats and oils Butters. Beverages Soft drinks. Other foods Cakes and pastries. The items listed above may not be a complete list of foods and beverages to avoid. Contact a dietitian for more  information. Summary  Fiber is a type of carbohydrate. It is found in fruits, vegetables, whole grains, and beans.  There are many health benefits of eating a high-fiber diet, such as preventing constipation, lowering blood cholesterol, helping with weight loss, and reducing your risk of heart disease, diabetes, and certain cancers.  Gradually increase your intake of fiber. Increasing too fast can result in cramping, bloating, and gas. Drink plenty of water while you increase your fiber.  The best sources of fiber include whole fruits and vegetables, whole grains, nuts, seeds, and beans. This information is not intended to replace advice given to you by your health care provider. Make sure you discuss any questions you have with your health care provider. Document Revised: 03/11/2017 Document Reviewed: 03/11/2017 Elsevier Patient Education  2020 [employer].

## 2019-09-07 ENCOUNTER — Other Ambulatory Visit: Payer: Self-pay

## 2019-09-07 ENCOUNTER — Other Ambulatory Visit: Payer: Medicare Other | Admitting: *Deleted

## 2019-09-07 DIAGNOSIS — I471 Supraventricular tachycardia: Secondary | ICD-10-CM

## 2019-09-07 DIAGNOSIS — I25119 Atherosclerotic heart disease of native coronary artery with unspecified angina pectoris: Secondary | ICD-10-CM

## 2019-09-07 DIAGNOSIS — E1159 Type 2 diabetes mellitus with other circulatory complications: Secondary | ICD-10-CM

## 2019-09-07 DIAGNOSIS — I5032 Chronic diastolic (congestive) heart failure: Secondary | ICD-10-CM

## 2019-09-07 DIAGNOSIS — G4733 Obstructive sleep apnea (adult) (pediatric): Secondary | ICD-10-CM

## 2019-09-07 DIAGNOSIS — I10 Essential (primary) hypertension: Secondary | ICD-10-CM

## 2019-09-07 LAB — HEPATIC FUNCTION PANEL
ALT: 15 IU/L (ref 0–44)
AST: 27 IU/L (ref 0–40)
Albumin: 4.1 g/dL (ref 3.8–4.8)
Alkaline Phosphatase: 62 IU/L (ref 39–117)
Bilirubin Total: 0.6 mg/dL (ref 0.0–1.2)
Bilirubin, Direct: 0.21 mg/dL (ref 0.00–0.40)
Total Protein: 7 g/dL (ref 6.0–8.5)

## 2019-09-07 LAB — LIPID PANEL
Chol/HDL Ratio: 1.8 ratio (ref 0.0–5.0)
Cholesterol, Total: 87 mg/dL — ABNORMAL LOW (ref 100–199)
HDL: 48 mg/dL (ref >39)
LDL Chol Calc (NIH): 22 mg/dL (ref 0–99)
Triglycerides: 83 mg/dL (ref 0–149)
VLDL Cholesterol Cal: 17 mg/dL (ref 5–40)

## 2019-09-27 ENCOUNTER — Other Ambulatory Visit: Payer: Self-pay

## 2019-09-27 ENCOUNTER — Emergency Department
Admission: EM | Admit: 2019-09-27 | Discharge: 2019-09-27 | Disposition: A | Discharge status: Home / Self Care | Payer: Medicare Other | Attending: Emergency Medicine | Admitting: Emergency Medicine

## 2019-09-27 ENCOUNTER — Encounter: Payer: Self-pay | Admitting: Emergency Medicine

## 2019-09-27 ENCOUNTER — Emergency Department: Payer: Medicare Other

## 2019-09-27 DIAGNOSIS — E119 Type 2 diabetes mellitus without complications: Secondary | ICD-10-CM | POA: Diagnosis not present

## 2019-09-27 DIAGNOSIS — Z7982 Long term (current) use of aspirin: Secondary | ICD-10-CM | POA: Insufficient documentation

## 2019-09-27 DIAGNOSIS — Z20822 Contact with and (suspected) exposure to covid-19: Secondary | ICD-10-CM | POA: Insufficient documentation

## 2019-09-27 DIAGNOSIS — Z87891 Personal history of nicotine dependence: Secondary | ICD-10-CM | POA: Diagnosis not present

## 2019-09-27 DIAGNOSIS — Z79899 Other long term (current) drug therapy: Secondary | ICD-10-CM | POA: Diagnosis not present

## 2019-09-27 DIAGNOSIS — J441 Chronic obstructive pulmonary disease with (acute) exacerbation: Secondary | ICD-10-CM | POA: Diagnosis not present

## 2019-09-27 DIAGNOSIS — R05 Cough: Secondary | ICD-10-CM | POA: Diagnosis present

## 2019-09-27 DIAGNOSIS — Z96653 Presence of artificial knee joint, bilateral: Secondary | ICD-10-CM | POA: Insufficient documentation

## 2019-09-27 DIAGNOSIS — I5032 Chronic diastolic (congestive) heart failure: Secondary | ICD-10-CM | POA: Diagnosis not present

## 2019-09-27 DIAGNOSIS — Z794 Long term (current) use of insulin: Secondary | ICD-10-CM | POA: Insufficient documentation

## 2019-09-27 DIAGNOSIS — I110 Hypertensive heart disease with heart failure: Secondary | ICD-10-CM | POA: Insufficient documentation

## 2019-09-27 DIAGNOSIS — I2510 Atherosclerotic heart disease of native coronary artery without angina pectoris: Secondary | ICD-10-CM | POA: Diagnosis not present

## 2019-09-27 DIAGNOSIS — J209 Acute bronchitis, unspecified: Secondary | ICD-10-CM

## 2019-09-27 LAB — BASIC METABOLIC PANEL
Anion gap: 10 (ref 5–15)
BUN: 15 mg/dL (ref 8–23)
CO2: 26 mmol/L (ref 22–32)
Calcium: 9.2 mg/dL (ref 8.9–10.3)
Chloride: 103 mmol/L (ref 98–111)
Creatinine, Ser: 1.07 mg/dL (ref 0.61–1.24)
GFR calc Af Amer: >60 mL/min (ref >60)
GFR calc non Af Amer: >60 mL/min (ref >60)
Glucose, Bld: 200 mg/dL — ABNORMAL HIGH (ref 70–99)
Potassium: 4.3 mmol/L (ref 3.5–5.1)
Sodium: 139 mmol/L (ref 135–145)

## 2019-09-27 LAB — CBC
HCT: 44 % (ref 39.0–52.0)
Hemoglobin: 14 g/dL (ref 13.0–17.0)
MCH: 27.5 pg (ref 26.0–34.0)
MCHC: 31.8 g/dL (ref 30.0–36.0)
MCV: 86.3 fL (ref 80.0–100.0)
Platelets: 232 10*3/uL (ref 150–400)
RBC: 5.1 MIL/uL (ref 4.22–5.81)
RDW: 14.5 % (ref 11.5–15.5)
WBC: 8.6 10*3/uL (ref 4.0–10.5)
nRBC: 0 % (ref 0.0–0.2)

## 2019-09-27 LAB — SARS CORONAVIRUS 2 (TAT 6-24 HRS): SARS Coronavirus 2: NEGATIVE

## 2019-09-27 LAB — TROPONIN I (HIGH SENSITIVITY)
Troponin I (High Sensitivity): 7 ng/L (ref <18)
Troponin I (High Sensitivity): 7 ng/L (ref <18)

## 2019-09-27 MED ORDER — METHYLPREDNISOLONE SODIUM SUCC 125 MG IJ SOLR
125.0000 mg | Freq: Once | INTRAMUSCULAR | Status: DC
  Filled 2019-09-27: qty 2

## 2019-09-27 MED ORDER — METHYLPREDNISOLONE SODIUM SUCC 125 MG IJ SOLR
125.0000 mg | Freq: Once | INTRAMUSCULAR | Status: AC
  Administered 2019-09-27: 16:00:00 125 mg via INTRAVENOUS

## 2019-09-27 MED ORDER — IPRATROPIUM-ALBUTEROL 0.5-2.5 (3) MG/3ML IN SOLN
3.0000 mL | Freq: Once | RESPIRATORY_TRACT | Status: AC
  Administered 2019-09-27: 3 mL via RESPIRATORY_TRACT
  Filled 2019-09-27: qty 3

## 2019-09-27 MED ORDER — SODIUM CHLORIDE 0.9% FLUSH: 3.0000 mL | Freq: Once | INTRAVENOUS | Status: DC

## 2019-09-27 MED ORDER — PREDNISONE 10 MG PO TABS: 50.0000 mg | ORAL_TABLET | Freq: Every day | ORAL | 0 refills | Status: DC

## 2019-09-27 NOTE — ED Triage Notes (Signed)
Pt presents to ED via POV with c/o CP that started this AM, pt states CP is substneral, 8/10 that is worse with coughing. Pt states has been coughing up yellow phlegm. Pt presents to ED A&O x4, skin warm, dry, and intact. Pt states pain worse with deep breathing and coughing at this time.

## 2019-09-27 NOTE — Discharge Instructions (Signed)
Continue using your albuterol inhaler.  Take the prednisone for the next few days as prescribed.  Follow-up with primary care provider for symptoms that are not improving with medications.  Return to the emergency department for symptoms of change or worsen if you are unable to schedule an appointment.  Your COVID-19 test is pending and you should receive the results within the next couple of days.  You should receive a phone call if it is positive.  If negative, you may not get a call but your test results will be available in your MyChart.

## 2019-09-27 NOTE — ED Provider Notes (Signed)
Medical screening examination/treatment/procedure(s) were conducted as a shared visit with non-physician practitioner(s) and myself.  I personally evaluated the patient during the encounter.    Patient resting comfortably.  Just completed his nebulizer treatment, reports he is not feeling short of breath.  He feels well at the moment.  Lungs are clear, with Norco breathing is normal speaking full clear sentences.  Clinical history provides count of upper respiratory congestion some mild sinusitis, previously fully immunized against Covid.  Denies fevers or chills.  Chest x-ray clear  He is breathing quite comfortably reports relief of symptoms.     Delman Kitten, MD 09/27/19 1622

## 2019-09-27 NOTE — ED Notes (Addendum)
Pt taken to triage 1 for repeat troponin and repeat VS. This RN explained and apologized for the wait.
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
## 2019-09-27 NOTE — ED Notes (Signed)
Pt verbalized understanding of discharge instructions. NAD at this time. 

## 2019-09-28 ENCOUNTER — Telehealth: Payer: Self-pay | Admitting: Internal Medicine

## 2019-09-28 NOTE — Telephone Encounter (Addendum)
Pt called to report that he was seen in the ED yesterday and told he had bronchitis and needed prednisone.. he says he had his first dose yesterday but noticed his heart has been on the 90's and 101 earlier this morning.James Maxwell   He will contact his PCP to see if there is any other treatment alternatives since he is reluctant to try anymore doses. He will monitor his HR and let us know if anything worsens or changes.

## 2019-09-28 NOTE — Telephone Encounter (Signed)
Pt c/o medication issue:  1. Name of Medication: predniSONE (DELTASONE) 10 MG tablet  2. How are you currently taking this medication (dosage and times per day)? 5 tablet daily  3. Are you having a reaction (difficulty breathing--STAT)? no  4. What is your medication issue? Patient states yesterday the hospital prescribed the medication and told him he had bronchitis. He states he only took the medication yesterday and his heart started racing. He states his HR was 90, 107, 97. He states he has not taken the medication today and would like to know if he should. Please advise.

## 2019-10-03 ENCOUNTER — Other Ambulatory Visit: Payer: Self-pay

## 2019-10-03 ENCOUNTER — Emergency Department (HOSPITAL_COMMUNITY): Payer: Medicare Other

## 2019-10-03 ENCOUNTER — Emergency Department (HOSPITAL_COMMUNITY)
Admission: EM | Admit: 2019-10-03 | Discharge: 2019-10-03 | Disposition: A | Discharge status: Home / Self Care | Payer: Medicare Other | Attending: Emergency Medicine | Admitting: Emergency Medicine

## 2019-10-03 ENCOUNTER — Encounter (HOSPITAL_COMMUNITY): Payer: Self-pay

## 2019-10-03 DIAGNOSIS — Z794 Long term (current) use of insulin: Secondary | ICD-10-CM | POA: Diagnosis not present

## 2019-10-03 DIAGNOSIS — Z7982 Long term (current) use of aspirin: Secondary | ICD-10-CM | POA: Insufficient documentation

## 2019-10-03 DIAGNOSIS — I5032 Chronic diastolic (congestive) heart failure: Secondary | ICD-10-CM | POA: Insufficient documentation

## 2019-10-03 DIAGNOSIS — J449 Chronic obstructive pulmonary disease, unspecified: Secondary | ICD-10-CM | POA: Insufficient documentation

## 2019-10-03 DIAGNOSIS — E1159 Type 2 diabetes mellitus with other circulatory complications: Secondary | ICD-10-CM | POA: Diagnosis not present

## 2019-10-03 DIAGNOSIS — Z87891 Personal history of nicotine dependence: Secondary | ICD-10-CM | POA: Insufficient documentation

## 2019-10-03 DIAGNOSIS — R05 Cough: Secondary | ICD-10-CM | POA: Diagnosis present

## 2019-10-03 DIAGNOSIS — Z79899 Other long term (current) drug therapy: Secondary | ICD-10-CM | POA: Insufficient documentation

## 2019-10-03 DIAGNOSIS — I110 Hypertensive heart disease with heart failure: Secondary | ICD-10-CM | POA: Insufficient documentation

## 2019-10-03 DIAGNOSIS — J40 Bronchitis, not specified as acute or chronic: Secondary | ICD-10-CM | POA: Diagnosis not present

## 2019-10-03 MED ORDER — DOXYCYCLINE HYCLATE 100 MG PO CAPS
100.0000 mg | ORAL_CAPSULE | Freq: Two times a day (BID) | ORAL | 0 refills | Status: DC
Start: 2019-10-03 — End: 2020-03-16
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
## 2019-10-03 NOTE — ED Triage Notes (Signed)
Arrived POV from home. Seen at [facility] and Dx with bronchitis on 09/27/19. Patient reports he was prescribed Prednisone but is afraid to take it because he had cardiac ablation done recently. Patient reports right rib pain, cough with yellow sputum; he wants to make sure his bronchitis is not turning into pneumonia

## 2019-10-03 NOTE — Discharge Instructions (Addendum)
Take 2 puffs of your albuterol inhaler every 6 hours.  Your chest x-ray today did not show any evidence of pneumonia.  Follow-up with your doctor next week

## 2020-03-07 ENCOUNTER — Telehealth: Payer: Self-pay | Admitting: Interventional Cardiology

## 2020-03-07 NOTE — Telephone Encounter (Signed)
Pt c/o BP issue: STAT if pt c/o blurred vision, one-sided weakness or slurred speech  1. What are your last 5 BP readings? 159/94 last hour, 165/89, 9 am today 180/85, 172/85, Oct. 15th 146/78  2. Are you having any other symptoms (ex. Dizziness, headache, blurred vision, passed out)? no  3. What is your BP issue? Sarah RN with [employer] the patient has had an elevated BP. She would like the patient to get the call back.

## 2020-03-08 ENCOUNTER — Other Ambulatory Visit: Payer: Self-pay | Admitting: Interventional Cardiology

## 2020-03-08 NOTE — Telephone Encounter (Signed)
Called and spoke to patient. He states that his BP has been elevated at 159/84, 165/89, 180/85, 172/85, and 146/78. He denies having any chest pain, HAs, dizziness, syncope, or any other Sx. He states that he has been checking his BP before taking his meds. He is on amlodipine 10 mg QD, losartan 100 mg QD, and hydralazine 50 MG BID. Had patient check BP while on the phone and BP now is 124/69 HR 84. He states that he took his meds about and hour and a half ago. I have instructed the patient to check his BP 1-2 hours after he has taken his meds and keep a record. He is overdue for his 6 mo APP f/u. I have scheduled him a virtual visit with Melina Copa, PA next week on 10/27. He will let James Maxwell know if his BP remains elevated or if he develops any Sx.     Patient Consent for Virtual Visit         [name] has provided verbal consent on 03/08/2020 for a virtual visit (video or telephone).   CONSENT FOR VIRTUAL VISIT FOR:  James Maxwell  By participating in this virtual visit I agree to the following:  I hereby voluntarily request, consent and authorize [facility] and its employed or contracted physicians, physician assistants, nurse practitioners or other licensed health care professionals (the Practitioner), to provide me with telemedicine health care services (the "Services") as deemed necessary by the treating Practitioner. I acknowledge and consent to receive the Services by the Practitioner via telemedicine. I understand that the telemedicine visit will involve communicating with the Practitioner through live audiovisual communication technology and the disclosure of certain medical information by electronic transmission. I acknowledge that I have been given the opportunity to request an in-person assessment or other available alternative prior to the telemedicine visit and am voluntarily participating in the telemedicine visit.  I understand that I have the right to withhold or withdraw my  consent to the use of telemedicine in the course of my care at any time, without affecting my right to future care or treatment, and that the Practitioner or I may terminate the telemedicine visit at any time. I understand that I have the right to inspect all information obtained and/or recorded in the course of the telemedicine visit and may receive copies of available information for a reasonable fee.  I understand that some of the potential risks of receiving the Services via telemedicine include:  [name] Delay or interruption in medical evaluation due to technological equipment failure or disruption; . Information transmitted may not be sufficient (e.g. poor resolution of images) to allow for appropriate medical decision making by the Practitioner; and/or  . In rare instances, security protocols could fail, causing a breach of personal health information.  Furthermore, I acknowledge that it is my responsibility to provide information about my medical history, conditions and care that is complete and accurate to the best of my ability. I acknowledge that Practitioner's advice, recommendations, and/or decision may be based on factors not within their control, such as incomplete or inaccurate data provided by me or distortions of diagnostic images or specimens that may result from electronic transmissions. I understand that the practice of medicine is not an exact science and that Practitioner makes no warranties or guarantees regarding treatment outcomes. I acknowledge that a copy of this consent can be made available to me via my patient portal ([facility]), or I can request a printed copy by calling  the office of [facility].    I understand that my insurance will be billed for this visit.   I have read or had this consent read to me. . I understand the contents of this consent, which adequately explains the benefits and risks of the Services being provided via telemedicine.  . I have been  provided ample opportunity to ask questions regarding this consent and the Services and have had my questions answered to my satisfaction. . I give my informed consent for the services to be provided through the use of telemedicine in my medical care

## 2020-03-13 ENCOUNTER — Encounter: Payer: Self-pay | Admitting: Physician Assistant
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
## 2020-03-16 ENCOUNTER — Telehealth (INDEPENDENT_AMBULATORY_CARE_PROVIDER_SITE_OTHER): Payer: Medicare Other | Admitting: Physician Assistant

## 2020-03-16 ENCOUNTER — Other Ambulatory Visit: Payer: Self-pay

## 2020-03-16 ENCOUNTER — Encounter: Payer: Self-pay | Admitting: Physician Assistant

## 2020-03-16 ENCOUNTER — Telehealth: Payer: Self-pay | Admitting: *Deleted

## 2020-03-16 VITALS — BP 133/70 | HR 74 | Ht 69.0 in | Wt 273.0 lb

## 2020-03-16 DIAGNOSIS — I5042 Chronic combined systolic (congestive) and diastolic (congestive) heart failure: Secondary | ICD-10-CM | POA: Diagnosis not present

## 2020-03-16 DIAGNOSIS — E785 Hyperlipidemia, unspecified: Secondary | ICD-10-CM

## 2020-03-16 DIAGNOSIS — R0609 Other forms of dyspnea: Secondary | ICD-10-CM

## 2020-03-16 DIAGNOSIS — I471 Supraventricular tachycardia: Secondary | ICD-10-CM

## 2020-03-16 DIAGNOSIS — I2510 Atherosclerotic heart disease of native coronary artery without angina pectoris: Secondary | ICD-10-CM

## 2020-03-16 DIAGNOSIS — R0600 Dyspnea, unspecified: Secondary | ICD-10-CM

## 2020-03-16 DIAGNOSIS — I10 Essential (primary) hypertension: Secondary | ICD-10-CM

## 2020-03-16 MED ORDER — FUROSEMIDE 40 MG PO TABS
ORAL_TABLET | ORAL | 2 refills | Status: DC
Start: 2020-03-16 — End: 2020-04-07

## 2020-03-16 MED ORDER — AMLODIPINE BESYLATE 5 MG PO TABS
5.0000 mg | ORAL_TABLET | Freq: Every day | ORAL | 3 refills | Status: DC
Start: 2020-03-16 — End: 2020-04-07

## 2020-03-16 NOTE — Patient Instructions (Addendum)
Medication Instructions:  Your physician has recommended you make the following change in your medication:  1.  DECREASE the Amlodipine to 5 mg.. 1/2 of the 10 mg tablet 2.  INCREASE the Lasix to 40 mg taking 2 tablets by mouth X's 3 days then go down to 1 tablet by mouth daily   *If you need a refill on your cardiac medications before your next appointment, please call your pharmacy*   Lab Work: 04/05/2020:  BMET, PRO BNP, CBC, & TSH  If you have labs (blood work) drawn today and your tests are completely normal, you will receive your results only by: [name] MyChart Message (if you have MyChart) OR . A paper copy in the mail If you have any lab test that is abnormal or we need to change your treatment, we will call you to review the results.   Testing/Procedures: Your physician has requested that you have an echocardiogram 04/05/2020 ARRIVE AT 3:50. Echocardiography is a painless test that uses sound waves to create images of your heart. It provides your doctor with information about the size and shape of your heart and how well your heart's chambers and valves are working. This procedure takes approximately one hour. There are no restrictions for this procedure.     Follow-Up: At [facility], you and your health needs are our priority.  As part of our continuing mission to provide you with exceptional heart care, we have created designated Provider Care Teams.  These Care Teams include your primary Cardiologist (physician) and Advanced Practice Providers (APPs -  Physician Assistants and Nurse Practitioners) who all work together to provide you with the care you need, when you need it.  We recommend signing up for the patient portal called "MyChart".  Sign up information is provided on this After Visit Summary.  MyChart is used to connect with patients for Virtual Visits (Telemedicine).  Patients are able to view lab/test results, encounter notes, upcoming appointments, etc.  Non-urgent  messages can be sent to your provider as well.   To learn more about what you can do with MyChart, go to [email or web address].    Your next appointment:    04/07/2020 ARRIVE AT 11:00   The format for your next appointment:   In Person  Provider:   Melina Copa, PA-C   Other Instructions  Echocardiogram An echocardiogram is a procedure that uses painless sound waves (ultrasound) to produce an image of the heart. Images from an echocardiogram can provide important information about:  Signs of coronary artery disease (CAD).  Aneurysm detection. An aneurysm is a weak or damaged part of an artery wall that bulges out from the normal force of blood pumping through the body.  Heart size and shape. Changes in the size or shape of the heart can be associated with certain conditions, including heart failure, aneurysm, and CAD.  Heart muscle function.  Heart valve function.  Signs of a past heart attack.  Fluid buildup around the heart.  Thickening of the heart muscle.  A tumor or infectious growth around the heart valves. Tell a health care provider about:  Any allergies you have.  All medicines you are taking, including vitamins, herbs, eye drops, creams, and over-the-counter medicines.  Any blood disorders you have.  Any surgeries you have had.  Any medical conditions you have.  Whether you are pregnant or may be pregnant. What are the risks? Generally, this is a safe procedure. However, problems may occur, including:  Allergic reaction to dye (contrast)  that may be used during the procedure. What happens before the procedure? No specific preparation is needed. You may eat and drink normally. What happens during the procedure?   An IV tube may be inserted into one of your veins.  You may receive contrast through this tube. A contrast is an injection that improves the quality of the pictures from your heart.  A gel will be applied to your chest.  A wand-like  tool (transducer) will be moved over your chest. The gel will help to transmit the sound waves from the transducer.  The sound waves will harmlessly bounce off of your heart to allow the heart images to be captured in real-time motion. The images will be recorded on a computer. The procedure may vary among health care providers and hospitals. What happens after the procedure?  You may return to your normal, everyday life, including diet, activities, and medicines, unless your health care provider tells you not to do that. Summary  An echocardiogram is a procedure that uses painless sound waves (ultrasound) to produce an image of the heart.  Images from an echocardiogram can provide important information about the size and shape of your heart, heart muscle function, heart valve function, and fluid buildup around your heart.  You do not need to do anything to prepare before this procedure. You may eat and drink normally.  After the echocardiogram is completed, you may return to your normal, everyday life, unless your health care provider tells you not to do that. This information is not intended to replace advice given to you by your health care provider. Make sure you discuss any questions you have with your health care provider. Document Revised: 08/28/2018 Document Reviewed: 06/09/2016 Elsevier Patient Education  [place].

## 2020-03-16 NOTE — Telephone Encounter (Signed)
°  Patient Consent for Virtual Visit         James Maxwell has provided verbal consent on 03/16/2020 for a virtual visit (video or telephone).   CONSENT FOR VIRTUAL VISIT FOR:  James Maxwell  By participating in this virtual visit I agree to the following:  I hereby voluntarily request, consent and authorize [facility] and its employed or contracted physicians, physician assistants, nurse practitioners or other licensed health care professionals (the Practitioner), to provide me with telemedicine health care services (the Services") as deemed necessary by the treating Practitioner. I acknowledge and consent to receive the Services by the Practitioner via telemedicine. I understand that the telemedicine visit will involve communicating with the Practitioner through live audiovisual communication technology and the disclosure of certain medical information by electronic transmission. I acknowledge that I have been given the opportunity to request an in-person assessment or other available alternative prior to the telemedicine visit and am voluntarily participating in the telemedicine visit.  I understand that I have the right to withhold or withdraw my consent to the use of telemedicine in the course of my care at any time, without affecting my right to future care or treatment, and that the Practitioner or I may terminate the telemedicine visit at any time. I understand that I have the right to inspect all information obtained and/or recorded in the course of the telemedicine visit and may receive copies of available information for a reasonable fee.  I understand that some of the potential risks of receiving the Services via telemedicine include:   Delay or interruption in medical evaluation due to technological equipment failure or disruption;  Information transmitted may not be sufficient (e.g. poor resolution of images) to allow for appropriate medical decision making by the Practitioner;  and/or   In rare instances, security protocols could fail, causing a breach of personal health information.  Furthermore, I acknowledge that it is my responsibility to provide information about my medical history, conditions and care that is complete and accurate to the best of my ability. I acknowledge that Practitioner's advice, recommendations, and/or decision may be based on factors not within their control, such as incomplete or inaccurate data provided by me or distortions of diagnostic images or specimens that may result from electronic transmissions. I understand that the practice of medicine is not an exact science and that Practitioner makes no warranties or guarantees regarding treatment outcomes. I acknowledge that a copy of this consent can be made available to me via my patient portal ([facility]), or I can request a printed copy by calling the office of [facility].    I understand that my insurance will be billed for this visit.   I have read or had this consent read to me.  I understand the contents of this consent, which adequately explains the benefits and risks of the Services being provided via telemedicine.   I have been provided ample opportunity to ask questions regarding this consent and the Services and have had my questions answered to my satisfaction.  I give my informed consent for the services to be provided through the use of telemedicine in my medical care

## 2020-04-05 ENCOUNTER — Other Ambulatory Visit: Payer: Medicare Other

## 2020-04-05 ENCOUNTER — Other Ambulatory Visit: Payer: Self-pay

## 2020-04-05 ENCOUNTER — Ambulatory Visit (HOSPITAL_COMMUNITY): Payer: Medicare Other | Attending: Cardiovascular Disease

## 2020-04-05 DIAGNOSIS — I5042 Chronic combined systolic (congestive) and diastolic (congestive) heart failure: Secondary | ICD-10-CM

## 2020-04-05 DIAGNOSIS — R0609 Other forms of dyspnea: Secondary | ICD-10-CM

## 2020-04-05 DIAGNOSIS — I10 Essential (primary) hypertension: Secondary | ICD-10-CM | POA: Diagnosis present

## 2020-04-05 DIAGNOSIS — R0600 Dyspnea, unspecified: Secondary | ICD-10-CM | POA: Diagnosis present

## 2020-04-05 DIAGNOSIS — E785 Hyperlipidemia, unspecified: Secondary | ICD-10-CM | POA: Diagnosis not present

## 2020-04-05 DIAGNOSIS — I471 Supraventricular tachycardia: Secondary | ICD-10-CM | POA: Diagnosis not present

## 2020-04-05 DIAGNOSIS — I2510 Atherosclerotic heart disease of native coronary artery without angina pectoris: Secondary | ICD-10-CM

## 2020-04-05 LAB — ECHOCARDIOGRAM COMPLETE
Area-P 1/2: 3.46 cm2
P 1/2 time: 209 msec
S' Lateral: 3.25 cm

## 2020-04-06 LAB — CBC
Hematocrit: 40.8 % (ref 37.5–51.0)
Hemoglobin: 13.4 g/dL (ref 13.0–17.7)
MCH: 27.1 pg (ref 26.6–33.0)
MCHC: 32.8 g/dL (ref 31.5–35.7)
MCV: 82 fL (ref 79–97)
Platelets: 247 10*3/uL (ref 150–450)
RBC: 4.95 x10E6/uL (ref 4.14–5.80)
RDW: 13.7 % (ref 11.6–15.4)
WBC: 8.4 10*3/uL (ref 3.4–10.8)

## 2020-04-06 LAB — BASIC METABOLIC PANEL
BUN/Creatinine Ratio: 11 (ref 10–24)
BUN: 12 mg/dL (ref 8–27)
CO2: 23 mmol/L (ref 20–29)
Calcium: 8.8 mg/dL (ref 8.6–10.2)
Chloride: 105 mmol/L (ref 96–106)
Creatinine, Ser: 1.06 mg/dL (ref 0.76–1.27)
GFR calc Af Amer: 82 mL/min/{1.73_m2} (ref >59)
GFR calc non Af Amer: 71 mL/min/{1.73_m2} (ref >59)
Glucose: 114 mg/dL — ABNORMAL HIGH (ref 65–99)
Potassium: 4.1 mmol/L (ref 3.5–5.2)
Sodium: 142 mmol/L (ref 134–144)

## 2020-04-06 LAB — TSH: TSH: 1.28 u[IU]/mL (ref 0.450–4.500)

## 2020-04-06 LAB — PRO B NATRIURETIC PEPTIDE: NT-Pro BNP: 125 pg/mL (ref 0–376)

## 2020-04-06 NOTE — Progress Notes (Signed)
Pt has been made aware of normal result and verbalized understanding.  jw
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
## 2020-04-07 ENCOUNTER — Other Ambulatory Visit: Payer: Self-pay

## 2020-04-07 ENCOUNTER — Encounter: Payer: Self-pay | Admitting: Physician Assistant

## 2020-04-07 ENCOUNTER — Ambulatory Visit: Payer: Medicare Other | Admitting: Physician Assistant

## 2020-04-07 VITALS — BP 130/74 | HR 82 | Ht 69.0 in | Wt 274.6 lb

## 2020-04-07 DIAGNOSIS — I5042 Chronic combined systolic (congestive) and diastolic (congestive) heart failure: Secondary | ICD-10-CM | POA: Diagnosis not present

## 2020-04-07 DIAGNOSIS — I471 Supraventricular tachycardia: Secondary | ICD-10-CM

## 2020-04-07 DIAGNOSIS — E785 Hyperlipidemia, unspecified: Secondary | ICD-10-CM

## 2020-04-07 DIAGNOSIS — R0600 Dyspnea, unspecified: Secondary | ICD-10-CM

## 2020-04-07 DIAGNOSIS — I10 Essential (primary) hypertension: Secondary | ICD-10-CM

## 2020-04-07 DIAGNOSIS — I2510 Atherosclerotic heart disease of native coronary artery without angina pectoris: Secondary | ICD-10-CM | POA: Diagnosis not present

## 2020-04-07 DIAGNOSIS — R0609 Other forms of dyspnea: Secondary | ICD-10-CM

## 2020-04-07 MED ORDER — POTASSIUM CHLORIDE CRYS ER 20 MEQ PO TBCR
20.0000 meq | EXTENDED_RELEASE_TABLET | Freq: Every day | ORAL | 3 refills | Status: DC
Start: 2020-04-07 — End: 2021-04-03

## 2020-04-07 MED ORDER — FUROSEMIDE 40 MG PO TABS
80.0000 mg | ORAL_TABLET | Freq: Every day | ORAL | 3 refills | Status: DC
Start: 2020-04-07 — End: 2021-03-31

## 2020-04-07 MED ORDER — AMLODIPINE BESYLATE 5 MG PO TABS
5.0000 mg | ORAL_TABLET | Freq: Every day | ORAL | 3 refills | Status: DC
Start: 2020-04-07 — End: 2021-03-31

## 2020-04-07 NOTE — Patient Instructions (Addendum)
Medication Instructions:  Your physician has recommended you make the following change in your medication:  1.  INCREASE the Lasix to 2 tablets daily 2.  START Potassium 20 meq taking 1 daily 3.  We changed your Amlodipine prescription to 5 mg taking 1 daily  *If you need a refill on your cardiac medications before your next appointment, please call your pharmacy*   Lab Work: 04/13/20:  COME TO THE OFFICE ANYTIME BETWEEN 7:30-3:00 FOR BMET   If you have labs (blood work) drawn today and your tests are completely normal, you will receive your results only by: [name] MyChart Message (if you have MyChart) OR . A paper copy in the mail If you have any lab test that is abnormal or we need to change your treatment, we will call you to review the results.   Testing/Procedures: None ordered   Follow-Up: At [facility], you and your health needs are our priority.  As part of our continuing mission to provide you with exceptional heart care, we have created designated Provider Care Teams.  These Care Teams include your primary Cardiologist (physician) and Advanced Practice Providers (APPs -  Physician Assistants and Nurse Practitioners) who all work together to provide you with the care you need, when you need it.  We recommend signing up for the patient portal called "MyChart".  Sign up information is provided on this After Visit Summary.  MyChart is used to connect with patients for Virtual Visits (Telemedicine).  Patients are able to view lab/test results, encounter notes, upcoming appointments, etc.  Non-urgent messages can be sent to your provider as well.   To learn more about what you can do with MyChart, go to [email or web address].    Your next appointment:   3-4 month(s)  The format for your next appointment:   In Person  Provider:   You may see Larae Grooms, MD or one of the following Advanced Practice Providers on your designated Care Team:    Melina Copa, [place]    Other  Instructions  For patients with congestive heart failure, we give them these special instructions:  1. Follow a low-salt diet - you are allowed no more than 2,000mg  of sodium per day. Watch your fluid intake. In general, you should not be taking in more than 2 liters of fluid per day (close to 64 oz of fluid per day). This includes sources of water in foods like soup, coffee, tea, milk, etc. 2. Weigh yourself on the same scale at same time of day and keep a log. 3. Call your doctor: (Anytime you feel any of the following symptoms)  - 3lb weight gain overnight or 5lb within a few days - Shortness of breath, with or without a dry hacking cough  - Swelling in the hands, feet or stomach  - If you have to sleep on extra pillows at night in order to breathe    We have sent Dr. Harrington Challenger and her RN, Caren Hazy, a message regarding the program to help with healthier eating.  Someone should reach out you in regards to this.   IT IS IMPORTANT TO LET YOUR DOCTOR KNOW EARLY ON IF YOU ARE HAVING SYMPTOMS SO WE CAN HELP YOU!

## 2020-04-13 ENCOUNTER — Other Ambulatory Visit: Payer: Self-pay

## 2020-04-13 ENCOUNTER — Other Ambulatory Visit: Payer: Medicare Other | Admitting: *Deleted

## 2020-04-13 DIAGNOSIS — R0609 Other forms of dyspnea: Secondary | ICD-10-CM

## 2020-04-13 DIAGNOSIS — I10 Essential (primary) hypertension: Secondary | ICD-10-CM

## 2020-04-13 DIAGNOSIS — R0600 Dyspnea, unspecified: Secondary | ICD-10-CM

## 2020-04-13 DIAGNOSIS — I5042 Chronic combined systolic (congestive) and diastolic (congestive) heart failure: Secondary | ICD-10-CM

## 2020-04-13 DIAGNOSIS — I2510 Atherosclerotic heart disease of native coronary artery without angina pectoris: Secondary | ICD-10-CM

## 2020-04-13 DIAGNOSIS — E785 Hyperlipidemia, unspecified: Secondary | ICD-10-CM

## 2020-04-13 DIAGNOSIS — I471 Supraventricular tachycardia: Secondary | ICD-10-CM

## 2020-04-13 LAB — BASIC METABOLIC PANEL
BUN/Creatinine Ratio: 13 (ref 10–24)
BUN: 15 mg/dL (ref 8–27)
CO2: 25 mmol/L (ref 20–29)
Calcium: 9.4 mg/dL (ref 8.6–10.2)
Chloride: 103 mmol/L (ref 96–106)
Creatinine, Ser: 1.14 mg/dL (ref 0.76–1.27)
GFR calc Af Amer: 75 mL/min/{1.73_m2} (ref >59)
GFR calc non Af Amer: 65 mL/min/{1.73_m2} (ref >59)
Glucose: 173 mg/dL — ABNORMAL HIGH (ref 65–99)
Potassium: 4.4 mmol/L (ref 3.5–5.2)
Sodium: 142 mmol/L (ref 134–144)

## 2020-05-02 ENCOUNTER — Telehealth: Payer: Self-pay | Admitting: Interventional Cardiology

## 2020-05-02 NOTE — Telephone Encounter (Signed)
Defer to PCP

## 2020-05-02 NOTE — Telephone Encounter (Signed)
   *  STAT* If patient is at the pharmacy, call can be transferred to refill team.   1. Which medications need to be refilled? (please list name of each medication and dose if known) pantoprazole (PROTONIX) 40 MG tablet  2. Which pharmacy/location (including street and city if local pharmacy) is medication to be sent to? [id number] - [place], [place]  3. Do they need a 30 day or 90 day supply? 90 days

## 2020-05-02 NOTE — Telephone Encounter (Signed)
Pt is requesting a refill on pantoprazole. Would Dr. Varanasi like to refill this medication? Please address 

## 2020-05-02 NOTE — Telephone Encounter (Signed)
Called pt to inform him per, [place], RN, Dr. Hassell Done nurse, for pt to contact PCP for refills on pantoprazole. I advised pt that if he has any other cardiac problems, questions or concerns, to give our office a call back. Pt verbalized understanding.
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
## 2020-07-08 ENCOUNTER — Other Ambulatory Visit: Payer: Self-pay

## 2020-07-08 ENCOUNTER — Encounter: Payer: Self-pay | Admitting: Interventional Cardiology

## 2020-07-08 ENCOUNTER — Ambulatory Visit: Payer: Medicare Other | Admitting: Interventional Cardiology

## 2020-07-08 VITALS — BP 114/58 | HR 74 | Ht 69.0 in | Wt 273.2 lb

## 2020-07-08 DIAGNOSIS — I2510 Atherosclerotic heart disease of native coronary artery without angina pectoris: Secondary | ICD-10-CM

## 2020-07-08 DIAGNOSIS — I10 Essential (primary) hypertension: Secondary | ICD-10-CM | POA: Diagnosis not present

## 2020-07-08 DIAGNOSIS — I471 Supraventricular tachycardia, unspecified: Secondary | ICD-10-CM

## 2020-07-08 DIAGNOSIS — G4733 Obstructive sleep apnea (adult) (pediatric): Secondary | ICD-10-CM

## 2020-07-08 DIAGNOSIS — I5042 Chronic combined systolic (congestive) and diastolic (congestive) heart failure: Secondary | ICD-10-CM

## 2020-07-08 DIAGNOSIS — E1159 Type 2 diabetes mellitus with other circulatory complications: Secondary | ICD-10-CM

## 2020-07-08 DIAGNOSIS — Z794 Long term (current) use of insulin: Secondary | ICD-10-CM

## 2020-07-08 DIAGNOSIS — E785 Hyperlipidemia, unspecified: Secondary | ICD-10-CM | POA: Diagnosis not present

## 2020-07-08 NOTE — Patient Instructions (Signed)
Medication Instructions:  Your physician recommends that you continue on your current medications as directed. Please refer to the Current Medication list given to you today.  *If you need a refill on your cardiac medications before your next appointment, please call your pharmacy*   Lab Work: none If you have labs (blood work) drawn today and your tests are completely normal, you will receive your results only by: [name] MyChart Message (if you have MyChart) OR . A paper copy in the mail If you have any lab test that is abnormal or we need to change your treatment, we will call you to review the results.   Testing/Procedures: none   Follow-Up: At [facility], you and your health needs are our priority.  As part of our continuing mission to provide you with exceptional heart care, we have created designated Provider Care Teams.  These Care Teams include your primary Cardiologist (physician) and Advanced Practice Providers (APPs -  Physician Assistants and Nurse Practitioners) who all work together to provide you with the care you need, when you need it.  We recommend signing up for the patient portal called "MyChart".  Sign up information is provided on this After Visit Summary.  MyChart is used to connect with patients for Virtual Visits (Telemedicine).  Patients are able to view lab/test results, encounter notes, upcoming appointments, etc.  Non-urgent messages can be sent to your provider as well.   To learn more about what you can do with MyChart, go to [email or web address].    Your next appointment:   6 month(s)  The format for your next appointment:   In Person  Provider:   You may see Larae Grooms, MD or one of the following Advanced Practice Providers on your designated Care Team:    Melina Copa, PA-C  Ermalinda Barrios, PA-C    Other Instructions  High-Fiber Eating Plan Fiber, also called dietary fiber, is a type of carbohydrate. It is found foods such as fruits,  vegetables, whole grains, and beans. A high-fiber diet can have many health benefits. Your health care provider may recommend a high-fiber diet to help:  Prevent constipation. Fiber can make your bowel movements more regular.  Lower your cholesterol.  Relieve the following conditions: ? Inflammation of veins in the anus (hemorrhoids). ? Inflammation of specific areas of the digestive tract (uncomplicated diverticulosis). ? A problem of the large intestine, also called the colon, that sometimes causes pain and diarrhea (irritable bowel syndrome, or IBS).  Prevent overeating as part of a weight-loss plan.  Prevent heart disease, type 2 diabetes, and certain cancers. What are tips for following this plan? Reading food labels  Check the nutrition facts label on food products for the amount of dietary fiber. Choose foods that have 5 grams of fiber or more per serving.  The goals for recommended daily fiber intake include: ? Men (age 66 or younger): 34-38 g. ? Men (over age 31): 28-34 g. ? Women (age 62 or younger): 25-28 g. ? Women (over age 69): 22-25 g. Your daily fiber goal is _____________ g.   Shopping  Choose whole fruits and vegetables instead of processed forms, such as apple juice or applesauce.  Choose a wide variety of high-fiber foods such as avocados, lentils, oats, and kidney beans.  Read the nutrition facts label of the foods you choose. Be aware of foods with added fiber. These foods often have high sugar and sodium amounts per serving. Cooking  Use whole-grain flour for baking and cooking.  Cook with brown rice instead of white rice. Meal planning  Start the day with a breakfast that is high in fiber, such as a cereal that contains 5 g of fiber or more per serving.  Eat breads and cereals that are made with whole-grain flour instead of refined flour or white flour.  Eat brown rice, bulgur wheat, or millet instead of white rice.  Use beans in place of meat in  soups, salads, and pasta dishes.  Be sure that half of the grains you eat each day are whole grains. General information  You can get the recommended daily intake of dietary fiber by: ? Eating a variety of fruits, vegetables, grains, nuts, and beans. ? Taking a fiber supplement if you are not able to take in enough fiber in your diet. It is better to get fiber through food than from a supplement.  Gradually increase how much fiber you consume. If you increase your intake of dietary fiber too quickly, you may have bloating, cramping, or gas.  Drink plenty of water to help you digest fiber.  Choose high-fiber snacks, such as berries, raw vegetables, nuts, and popcorn. What foods should I eat? Fruits Berries. Pears. Apples. Oranges. Avocado. Prunes and raisins. Dried figs. Vegetables Sweet potatoes. Spinach. Kale. Artichokes. Cabbage. Broccoli. Cauliflower. Green peas. Carrots. Squash. Grains Whole-grain breads. Multigrain cereal. Oats and oatmeal. Brown rice. Barley. Bulgur wheat. Millet. Quinoa. Bran muffins. Popcorn. Rye wafer crackers. Meats and other proteins Navy beans, kidney beans, and pinto beans. Soybeans. Split peas. Lentils. Nuts and seeds. Dairy Fiber-fortified yogurt. Beverages Fiber-fortified soy milk. Fiber-fortified orange juice. Other foods Fiber bars. The items listed above may not be a complete list of recommended foods and beverages. Contact a dietitian for more information. What foods should I avoid? Fruits Fruit juice. Cooked, strained fruit. Vegetables Fried potatoes. Canned vegetables. Well-cooked vegetables. Grains White bread. Pasta made with refined flour. White rice. Meats and other proteins Fatty cuts of meat. Fried chicken or fried fish. Dairy Milk. Yogurt. Cream cheese. Sour cream. Fats and oils Butters. Beverages Soft drinks. Other foods Cakes and pastries. The items listed above may not be a complete list of foods and beverages to avoid.  Talk with your dietitian about what choices are best for you. Summary  Fiber is a type of carbohydrate. It is found in foods such as fruits, vegetables, whole grains, and beans.  A high-fiber diet has many benefits. It can help to prevent constipation, lower blood cholesterol, aid weight loss, and reduce your risk of heart disease, diabetes, and certain cancers.  Increase your intake of fiber gradually. Increasing fiber too quickly may cause cramping, bloating, and gas. Drink plenty of water while you increase the amount of fiber you consume.  The best sources of fiber include whole fruits and vegetables, whole grains, nuts, seeds, and beans. This information is not intended to replace advice given to you by your health care provider. Make sure you discuss any questions you have with your health care provider. Document Revised: 09/10/2019 Document Reviewed: 09/10/2019 Elsevier Patient Education  2021 Elsevier Inc.   

## 2020-07-19 ENCOUNTER — Telehealth: Payer: Self-pay | Admitting: Interventional Cardiology

## 2020-07-19 MED ORDER — CLOPIDOGREL BISULFATE 75 MG PO TABS: 75.0000 mg | ORAL_TABLET | Freq: Every day | ORAL | 3 refills | Status: DC

## 2020-07-19 NOTE — Telephone Encounter (Signed)
Pt's medication was sent to pt's pharmacy as requested. Confirmation received.  °

## 2020-07-19 NOTE — Telephone Encounter (Signed)
   *  STAT* If patient is at the pharmacy, call can be transferred to refill team.   1. Which medications need to be refilled? (please list name of each medication and dose if known) clopidogrel (PLAVIX) 75 MG tablet  2. Which pharmacy/location (including street and city if local pharmacy) is medication to be sent to? [id number] - [place], [place]  3. Do they need a 30 day or 90 day supply? 90 days

## 2020-08-08 ENCOUNTER — Other Ambulatory Visit: Payer: Self-pay | Admitting: Interventional Cardiology

## 2020-08-25 DIAGNOSIS — Z961 Presence of intraocular lens: Secondary | ICD-10-CM | POA: Diagnosis not present

## 2020-08-25 DIAGNOSIS — E119 Type 2 diabetes mellitus without complications: Secondary | ICD-10-CM | POA: Diagnosis not present

## 2020-08-25 DIAGNOSIS — H524 Presbyopia: Secondary | ICD-10-CM | POA: Diagnosis not present

## 2020-08-25 DIAGNOSIS — H40013 Open angle with borderline findings, low risk, bilateral: Secondary | ICD-10-CM | POA: Diagnosis not present

## 2020-08-25 DIAGNOSIS — H26493 Other secondary cataract, bilateral: Secondary | ICD-10-CM | POA: Diagnosis not present

## 2020-08-30 DIAGNOSIS — H26491 Other secondary cataract, right eye: Secondary | ICD-10-CM | POA: Diagnosis not present

## 2020-10-04 DIAGNOSIS — E1351 Other specified diabetes mellitus with diabetic peripheral angiopathy without gangrene: Secondary | ICD-10-CM | POA: Diagnosis not present

## 2020-10-04 DIAGNOSIS — L84 Corns and callosities: Secondary | ICD-10-CM | POA: Diagnosis not present

## 2020-10-04 DIAGNOSIS — L602 Onychogryphosis: Secondary | ICD-10-CM | POA: Diagnosis not present

## 2020-10-04 DIAGNOSIS — L03032 Cellulitis of left toe: Secondary | ICD-10-CM | POA: Diagnosis not present

## 2020-10-04 DIAGNOSIS — I739 Peripheral vascular disease, unspecified: Secondary | ICD-10-CM | POA: Diagnosis not present

## 2020-10-19 DIAGNOSIS — E782 Mixed hyperlipidemia: Secondary | ICD-10-CM | POA: Diagnosis not present

## 2020-10-19 DIAGNOSIS — E119 Type 2 diabetes mellitus without complications: Secondary | ICD-10-CM | POA: Diagnosis not present

## 2020-10-19 DIAGNOSIS — I10 Essential (primary) hypertension: Secondary | ICD-10-CM | POA: Diagnosis not present

## 2020-10-19 DIAGNOSIS — I739 Peripheral vascular disease, unspecified: Secondary | ICD-10-CM | POA: Diagnosis not present

## 2020-10-21 DIAGNOSIS — J209 Acute bronchitis, unspecified: Secondary | ICD-10-CM | POA: Diagnosis not present

## 2020-10-21 DIAGNOSIS — J301 Allergic rhinitis due to pollen: Secondary | ICD-10-CM | POA: Diagnosis not present

## 2020-10-21 DIAGNOSIS — M5412 Radiculopathy, cervical region: Secondary | ICD-10-CM | POA: Diagnosis not present

## 2020-10-21 DIAGNOSIS — J309 Allergic rhinitis, unspecified: Secondary | ICD-10-CM | POA: Diagnosis not present

## 2020-10-21 DIAGNOSIS — E119 Type 2 diabetes mellitus without complications: Secondary | ICD-10-CM | POA: Diagnosis not present

## 2020-10-21 DIAGNOSIS — E782 Mixed hyperlipidemia: Secondary | ICD-10-CM | POA: Diagnosis not present

## 2020-10-29 DIAGNOSIS — E118 Type 2 diabetes mellitus with unspecified complications: Secondary | ICD-10-CM | POA: Diagnosis not present

## 2020-10-29 DIAGNOSIS — Z7984 Long term (current) use of oral hypoglycemic drugs: Secondary | ICD-10-CM | POA: Diagnosis not present

## 2020-10-29 DIAGNOSIS — L03039 Cellulitis of unspecified toe: Secondary | ICD-10-CM | POA: Diagnosis not present

## 2020-10-29 DIAGNOSIS — L03049 Acute lymphangitis of unspecified toe: Secondary | ICD-10-CM | POA: Diagnosis not present

## 2020-12-02 DIAGNOSIS — E782 Mixed hyperlipidemia: Secondary | ICD-10-CM | POA: Diagnosis not present

## 2020-12-02 DIAGNOSIS — E119 Type 2 diabetes mellitus without complications: Secondary | ICD-10-CM | POA: Diagnosis not present

## 2020-12-02 DIAGNOSIS — J301 Allergic rhinitis due to pollen: Secondary | ICD-10-CM | POA: Diagnosis not present

## 2020-12-02 DIAGNOSIS — M5412 Radiculopathy, cervical region: Secondary | ICD-10-CM | POA: Diagnosis not present

## 2020-12-27 DIAGNOSIS — I739 Peripheral vascular disease, unspecified: Secondary | ICD-10-CM | POA: Diagnosis not present

## 2020-12-27 DIAGNOSIS — L602 Onychogryphosis: Secondary | ICD-10-CM | POA: Diagnosis not present

## 2021-01-25 DIAGNOSIS — E119 Type 2 diabetes mellitus without complications: Secondary | ICD-10-CM | POA: Diagnosis not present

## 2021-01-25 DIAGNOSIS — I10 Essential (primary) hypertension: Secondary | ICD-10-CM | POA: Diagnosis not present

## 2021-01-27 DIAGNOSIS — J309 Allergic rhinitis, unspecified: Secondary | ICD-10-CM | POA: Diagnosis not present

## 2021-01-27 DIAGNOSIS — J301 Allergic rhinitis due to pollen: Secondary | ICD-10-CM | POA: Diagnosis not present

## 2021-01-27 DIAGNOSIS — K219 Gastro-esophageal reflux disease without esophagitis: Secondary | ICD-10-CM | POA: Diagnosis not present

## 2021-01-27 DIAGNOSIS — E782 Mixed hyperlipidemia: Secondary | ICD-10-CM | POA: Diagnosis not present

## 2021-01-27 DIAGNOSIS — M5412 Radiculopathy, cervical region: Secondary | ICD-10-CM | POA: Diagnosis not present

## 2021-01-27 DIAGNOSIS — E119 Type 2 diabetes mellitus without complications: Secondary | ICD-10-CM | POA: Diagnosis not present

## 2021-01-27 DIAGNOSIS — J209 Acute bronchitis, unspecified: Secondary | ICD-10-CM | POA: Diagnosis not present

## 2021-01-27 DIAGNOSIS — Z23 Encounter for immunization: Secondary | ICD-10-CM | POA: Diagnosis not present

## 2021-03-03 DIAGNOSIS — E782 Mixed hyperlipidemia: Secondary | ICD-10-CM | POA: Diagnosis not present

## 2021-03-03 DIAGNOSIS — M545 Low back pain, unspecified: Secondary | ICD-10-CM | POA: Diagnosis not present

## 2021-03-03 DIAGNOSIS — E119 Type 2 diabetes mellitus without complications: Secondary | ICD-10-CM | POA: Diagnosis not present

## 2021-03-03 DIAGNOSIS — I10 Essential (primary) hypertension: Secondary | ICD-10-CM | POA: Diagnosis not present

## 2021-03-03 DIAGNOSIS — I2510 Atherosclerotic heart disease of native coronary artery without angina pectoris: Secondary | ICD-10-CM | POA: Diagnosis not present

## 2021-03-07 DIAGNOSIS — L84 Corns and callosities: Secondary | ICD-10-CM | POA: Diagnosis not present

## 2021-03-07 DIAGNOSIS — E1142 Type 2 diabetes mellitus with diabetic polyneuropathy: Secondary | ICD-10-CM | POA: Diagnosis not present

## 2021-03-07 DIAGNOSIS — B351 Tinea unguium: Secondary | ICD-10-CM | POA: Diagnosis not present

## 2021-03-22 ENCOUNTER — Emergency Department
Admission: EM | Admit: 2021-03-22 | Discharge: 2021-03-22 | Disposition: A | Discharge status: Home / Self Care | Payer: Medicare Other | Attending: Emergency Medicine | Admitting: Emergency Medicine

## 2021-03-22 ENCOUNTER — Emergency Department: Payer: Medicare Other

## 2021-03-22 ENCOUNTER — Encounter: Payer: Self-pay | Admitting: Emergency Medicine

## 2021-03-22 ENCOUNTER — Other Ambulatory Visit: Payer: Self-pay

## 2021-03-22 DIAGNOSIS — I2510 Atherosclerotic heart disease of native coronary artery without angina pectoris: Secondary | ICD-10-CM | POA: Diagnosis not present

## 2021-03-22 DIAGNOSIS — Z7984 Long term (current) use of oral hypoglycemic drugs: Secondary | ICD-10-CM | POA: Diagnosis not present

## 2021-03-22 DIAGNOSIS — Z794 Long term (current) use of insulin: Secondary | ICD-10-CM | POA: Insufficient documentation

## 2021-03-22 DIAGNOSIS — U071 COVID-19: Secondary | ICD-10-CM | POA: Insufficient documentation

## 2021-03-22 DIAGNOSIS — I110 Hypertensive heart disease with heart failure: Secondary | ICD-10-CM | POA: Insufficient documentation

## 2021-03-22 DIAGNOSIS — Z96653 Presence of artificial knee joint, bilateral: Secondary | ICD-10-CM | POA: Insufficient documentation

## 2021-03-22 DIAGNOSIS — M2578 Osteophyte, vertebrae: Secondary | ICD-10-CM | POA: Diagnosis not present

## 2021-03-22 DIAGNOSIS — I5042 Chronic combined systolic (congestive) and diastolic (congestive) heart failure: Secondary | ICD-10-CM | POA: Insufficient documentation

## 2021-03-22 DIAGNOSIS — E1169 Type 2 diabetes mellitus with other specified complication: Secondary | ICD-10-CM | POA: Diagnosis not present

## 2021-03-22 DIAGNOSIS — Z7982 Long term (current) use of aspirin: Secondary | ICD-10-CM | POA: Diagnosis not present

## 2021-03-22 DIAGNOSIS — Z79899 Other long term (current) drug therapy: Secondary | ICD-10-CM | POA: Insufficient documentation

## 2021-03-22 DIAGNOSIS — R0602 Shortness of breath: Secondary | ICD-10-CM

## 2021-03-22 DIAGNOSIS — Z87891 Personal history of nicotine dependence: Secondary | ICD-10-CM | POA: Diagnosis not present

## 2021-03-22 DIAGNOSIS — I700 Atherosclerosis of aorta: Secondary | ICD-10-CM | POA: Diagnosis not present

## 2021-03-22 LAB — CBC
HCT: 41.8 % (ref 39.0–52.0)
Hemoglobin: 13.5 g/dL (ref 13.0–17.0)
MCH: 27.2 pg (ref 26.0–34.0)
MCHC: 32.3 g/dL (ref 30.0–36.0)
MCV: 84.1 fL (ref 80.0–100.0)
Platelets: 209 10*3/uL (ref 150–400)
RBC: 4.97 MIL/uL (ref 4.22–5.81)
RDW: 14.8 % (ref 11.5–15.5)
WBC: 7.9 10*3/uL (ref 4.0–10.5)
nRBC: 0 % (ref 0.0–0.2)

## 2021-03-22 LAB — BASIC METABOLIC PANEL
Anion gap: 10 (ref 5–15)
BUN: 16 mg/dL (ref 8–23)
CO2: 25 mmol/L (ref 22–32)
Calcium: 8.8 mg/dL — ABNORMAL LOW (ref 8.9–10.3)
Chloride: 105 mmol/L (ref 98–111)
Creatinine, Ser: 1.13 mg/dL (ref 0.61–1.24)
GFR, Estimated: >60 mL/min (ref >60)
Glucose, Bld: 107 mg/dL — ABNORMAL HIGH (ref 70–99)
Potassium: 3.9 mmol/L (ref 3.5–5.1)
Sodium: 140 mmol/L (ref 135–145)

## 2021-03-22 LAB — TROPONIN I (HIGH SENSITIVITY)
Troponin I (High Sensitivity): 11 ng/L (ref <18)
Troponin I (High Sensitivity): 11 ng/L (ref <18)

## 2021-03-22 MED ORDER — ONDANSETRON 4 MG PO TBDP: 4.0000 mg | ORAL_TABLET | Freq: Three times a day (TID) | ORAL | 0 refills | Status: DC | PRN

## 2021-03-22 MED ORDER — IOHEXOL 350 MG/ML SOLN
75.0000 mL | Freq: Once | INTRAVENOUS | Status: AC | PRN
  Administered 2021-03-22: 75 mL via INTRAVENOUS
  Filled 2021-03-22: qty 75

## 2021-03-22 MED ORDER — NIRMATRELVIR/RITONAVIR (PAXLOVID)TABLET: 3.0000 | ORAL_TABLET | Freq: Two times a day (BID) | ORAL | 0 refills | Status: AC
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
## 2021-03-22 NOTE — ED Triage Notes (Signed)
Pt comes into the ED via POV c/o increased SHOB, COVID positive and now having right rib pain that radiates into his back.  Pt testes positive for COVID last Sunday,  Pt currently has even and unlabored respirations at this time.  Pt states the pain in his rib and into his back is present on inhalation. Pt is currently is on Plavix.

## 2021-03-22 NOTE — ED Notes (Signed)
Patient transported to CT 

## 2021-03-22 NOTE — ED Notes (Signed)
See triage note. Pt stating after testing COVID + he started having right sided rib pain that increases with inspiration and SOB.

## 2021-03-22 NOTE — ED Notes (Signed)
Says he has been sick for at leaset 3 weeks.  Says less cough now, but has pain, but he has painwith cough or deep breath in right lower rib area.  No fevers.  No distress.

## 2021-03-30 DIAGNOSIS — E119 Type 2 diabetes mellitus without complications: Secondary | ICD-10-CM | POA: Diagnosis not present

## 2021-03-31 ENCOUNTER — Other Ambulatory Visit: Payer: Self-pay | Admitting: Physician Assistant

## 2021-03-31 DIAGNOSIS — E782 Mixed hyperlipidemia: Secondary | ICD-10-CM | POA: Diagnosis not present

## 2021-03-31 DIAGNOSIS — E119 Type 2 diabetes mellitus without complications: Secondary | ICD-10-CM | POA: Diagnosis not present

## 2021-03-31 DIAGNOSIS — J209 Acute bronchitis, unspecified: Secondary | ICD-10-CM | POA: Diagnosis not present

## 2021-03-31 DIAGNOSIS — M5412 Radiculopathy, cervical region: Secondary | ICD-10-CM | POA: Diagnosis not present

## 2021-03-31 DIAGNOSIS — J301 Allergic rhinitis due to pollen: Secondary | ICD-10-CM | POA: Diagnosis not present

## 2021-04-02 ENCOUNTER — Other Ambulatory Visit: Payer: Self-pay | Admitting: Physician Assistant

## 2021-04-02 ENCOUNTER — Other Ambulatory Visit: Payer: Self-pay | Admitting: Interventional Cardiology

## 2021-05-11 DIAGNOSIS — I10 Essential (primary) hypertension: Secondary | ICD-10-CM | POA: Diagnosis not present

## 2021-05-11 DIAGNOSIS — E119 Type 2 diabetes mellitus without complications: Secondary | ICD-10-CM | POA: Diagnosis not present

## 2021-05-11 DIAGNOSIS — E782 Mixed hyperlipidemia: Secondary | ICD-10-CM | POA: Diagnosis not present

## 2021-05-12 DIAGNOSIS — E782 Mixed hyperlipidemia: Secondary | ICD-10-CM | POA: Diagnosis not present

## 2021-05-12 DIAGNOSIS — J301 Allergic rhinitis due to pollen: Secondary | ICD-10-CM | POA: Diagnosis not present

## 2021-05-12 DIAGNOSIS — J209 Acute bronchitis, unspecified: Secondary | ICD-10-CM | POA: Diagnosis not present

## 2021-05-12 DIAGNOSIS — E119 Type 2 diabetes mellitus without complications: Secondary | ICD-10-CM | POA: Diagnosis not present

## 2021-05-12 DIAGNOSIS — K219 Gastro-esophageal reflux disease without esophagitis: Secondary | ICD-10-CM | POA: Diagnosis not present

## 2021-05-12 DIAGNOSIS — M5412 Radiculopathy, cervical region: Secondary | ICD-10-CM | POA: Diagnosis not present

## 2021-05-12 DIAGNOSIS — Z0001 Encounter for general adult medical examination with abnormal findings: Secondary | ICD-10-CM | POA: Diagnosis not present

## 2021-05-12 DIAGNOSIS — I2510 Atherosclerotic heart disease of native coronary artery without angina pectoris: Secondary | ICD-10-CM | POA: Diagnosis not present

## 2021-07-18 ENCOUNTER — Other Ambulatory Visit: Payer: Self-pay | Admitting: Interventional Cardiology

## 2021-08-09 ENCOUNTER — Telehealth: Payer: Self-pay | Admitting: Interventional Cardiology

## 2021-08-09 NOTE — Telephone Encounter (Signed)
Pt c/o of Chest Pain: STAT if CP now or developed within 24 hours ? ?1. Are you having CP right now? earlier this morning- pressure in his chest ? ?2. Are you experiencing any other symptoms (ex. SOB, nausea, vomiting, sweating)? no ? ?3. How long have you been experiencing CP? last 3 or 4 days ? ?4. Is your CP continuous or coming and going?  Comes and goes l ? ?5. Have you taken Nitroglycerin? Yes- pt wanted an appointment- I made first available appointment on Monday(08-14-21)- please call to evaluate ?? ? ?

## 2021-08-09 NOTE — Telephone Encounter (Signed)
Left a message for the pt to call back.  

## 2021-08-10 ENCOUNTER — Ambulatory Visit (HOSPITAL_BASED_OUTPATIENT_CLINIC_OR_DEPARTMENT_OTHER): Payer: Medicare Other | Admitting: Family

## 2021-08-10 ENCOUNTER — Other Ambulatory Visit: Payer: Self-pay | Admitting: Interventional Cardiology

## 2021-08-10 ENCOUNTER — Encounter (HOSPITAL_BASED_OUTPATIENT_CLINIC_OR_DEPARTMENT_OTHER): Payer: Self-pay | Admitting: Family

## 2021-08-10 ENCOUNTER — Other Ambulatory Visit: Payer: Self-pay

## 2021-08-10 VITALS — BP 138/86 | HR 61 | Ht 69.0 in | Wt 269.9 lb

## 2021-08-10 DIAGNOSIS — I25118 Atherosclerotic heart disease of native coronary artery with other forms of angina pectoris: Secondary | ICD-10-CM | POA: Diagnosis not present

## 2021-08-10 DIAGNOSIS — I10 Essential (primary) hypertension: Secondary | ICD-10-CM

## 2021-08-10 DIAGNOSIS — E1159 Type 2 diabetes mellitus with other circulatory complications: Secondary | ICD-10-CM

## 2021-08-10 DIAGNOSIS — I471 Supraventricular tachycardia: Secondary | ICD-10-CM | POA: Diagnosis not present

## 2021-08-10 DIAGNOSIS — I5032 Chronic diastolic (congestive) heart failure: Secondary | ICD-10-CM

## 2021-08-10 DIAGNOSIS — R0609 Other forms of dyspnea: Secondary | ICD-10-CM

## 2021-08-10 DIAGNOSIS — G4733 Obstructive sleep apnea (adult) (pediatric): Secondary | ICD-10-CM

## 2021-08-10 DIAGNOSIS — Z794 Long term (current) use of insulin: Secondary | ICD-10-CM

## 2021-08-10 MED ORDER — ROSUVASTATIN CALCIUM 20 MG PO TABS: 20.0000 mg | ORAL_TABLET | Freq: Every day | ORAL | 3 refills | Status: DC

## 2021-08-10 NOTE — Patient Instructions (Addendum)
Medication Instructions:  ?Continue your current medications.  ? ?*If you need a refill on your cardiac medications before your next appointment, please call your pharmacy* ? ? ?Lab Work: ?Recommend cholesterol lab work with your primary care provider.  ? ?If you have labs (blood work) drawn today and your tests are completely normal, you will receive your results only by: ?MyChart Message (if you have MyChart) OR ?A paper copy in the mail ?If you have any lab test that is abnormal or we need to change your treatment, we will call you to review the results. ? ?Testing/Procedures: ?Your physician has requested that you have an echocardiogram. Echocardiography is a painless test that uses sound waves to create images of your heart. It provides your doctor with information about the size and shape of your heart and how well your heart?s chambers and valves are working. This procedure takes approximately one hour. There are no restrictions for this procedure.  ? ?Your physician has requested that you have a lexiscan myoview.  Please follow instruction sheet, as given.  ? ? ?Follow-Up: ?At [facility], you and your health needs are our priority.  As part of our continuing mission to provide you with exceptional heart care, we have created designated Provider Care Teams.  These Care Teams include your primary Cardiologist (physician) and Advanced Practice Providers (APPs -  Physician Assistants and Nurse Practitioners) who all work together to provide you with the care you need, when you need it. ? ?We recommend signing up for the patient portal called "MyChart".  Sign up information is provided on this After Visit Summary.  MyChart is used to connect with patients for Virtual Visits (Telemedicine).  Patients are able to view lab/test results, encounter notes, upcoming appointments, etc.  Non-urgent messages can be sent to your provider as well.   ?To learn more about what you can do with MyChart, go to  [email or web address].   ? ?Your next appointment:   ?In July as scheduled with Dr. Irish Lack  ? ?Other Instructions ? Your Physician has requested you have a Myocardial Perfusion Imaging Study.  ? ? ?Please arrive 15 minutes prior to your appointment time for registration and insurance purposes.  ? ?The test will take approximately 3 to 4 hours to complete; you may bring reading material. If someone comes with you to your appointment, they will need to remain in the main lobby due to limited testing space in the testing area. **If you are pregnant or breastfeeding, please notify the nuclear lab prior to your appointment**  ? ?How to prepare for your test:  ?- Do not eat or drink 3 hours prior to your test, except you may have water  ?- Do not consume products containing caffeine ( regular or decaf) 12 hours prior to your test ( coffee, chocolate, sodas or teas)  ?- Do bring a list of your current medications with you. If not listed below, you may take your medications as normal (Hold beta blocker- 24 hour prior for exercise myoview)   ?- Do wear comfortable clothes (no dresses or overalls) and walking shoes ( tennis shoes preferred), no heel or open toe shoes are allowed ?- Do not wear cologne, perfume, aftershave, or lotions ( deodorant is allowed)  ?- If these instructions are not followed, your test will be rescheduled  ? ?If you cannot keep your appointment, please provide 24 hours notice to the Nuclear Lab, to avoid a possible $50 charge to your account!  ? ?

## 2021-08-10 NOTE — Telephone Encounter (Signed)
Called and spoke to patient. His appt is actually today 3/23 at 2:45. He states his symptoms are "good" at this time, but he does still want to be seen so that someone has evaluated him. Supplied pt with address and he knows that it's on the second floor. No other concerns at this time. ?
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
## 2021-08-14 ENCOUNTER — Ambulatory Visit (HOSPITAL_BASED_OUTPATIENT_CLINIC_OR_DEPARTMENT_OTHER): Payer: Medicare Other | Admitting: Family

## 2021-08-23 ENCOUNTER — Telehealth (HOSPITAL_COMMUNITY): Payer: Self-pay | Admitting: *Deleted

## 2021-08-23 NOTE — Telephone Encounter (Signed)
Patient given detailed instructions per Myocardial Perfusion Study Information Sheet for the test on 08/30/21 Patient notified to arrive 15 minutes early and that it is imperative to arrive on time for appointment to keep from having the test rescheduled. ? If you need to cancel or reschedule your appointment, please call the office within 24 hours of your appointment. . Patient verbalized understanding.James Maxwell [name] ? ? ?

## 2021-08-30 ENCOUNTER — Ambulatory Visit (HOSPITAL_COMMUNITY): Payer: Medicare Other | Attending: Cardiology

## 2021-08-30 ENCOUNTER — Ambulatory Visit (HOSPITAL_BASED_OUTPATIENT_CLINIC_OR_DEPARTMENT_OTHER): Payer: Medicare Other

## 2021-08-30 ENCOUNTER — Telehealth (HOSPITAL_BASED_OUTPATIENT_CLINIC_OR_DEPARTMENT_OTHER): Payer: Self-pay

## 2021-08-30 DIAGNOSIS — I25118 Atherosclerotic heart disease of native coronary artery with other forms of angina pectoris: Secondary | ICD-10-CM | POA: Diagnosis present

## 2021-08-30 DIAGNOSIS — R0609 Other forms of dyspnea: Secondary | ICD-10-CM | POA: Diagnosis not present

## 2021-08-30 DIAGNOSIS — R079 Chest pain, unspecified: Secondary | ICD-10-CM | POA: Diagnosis not present

## 2021-08-30 LAB — MYOCARDIAL PERFUSION IMAGING
LV dias vol: 98 mL (ref 62–150)
LV sys vol: 46 mL
Nuc Stress EF: 53 %
Peak HR: 104 {beats}/min
Rest HR: 81 {beats}/min
Rest Nuclear Isotope Dose: 11 mCi
SDS: 3
SRS: 0
SSS: 3
ST Depression (mm): 0 mm
Stress Nuclear Isotope Dose: 33 mCi
TID: 1.03

## 2021-08-30 LAB — ECHOCARDIOGRAM COMPLETE
Area-P 1/2: 3.77 cm2
Height: 69 in
S' Lateral: 3.1 cm
Weight: 4304 oz

## 2021-08-30 MED ORDER — TECHNETIUM TC 99M TETROFOSMIN IV KIT
11.0000 | PACK | Freq: Once | INTRAVENOUS | Status: AC | PRN
  Administered 2021-08-30: 11 via INTRAVENOUS
  Filled 2021-08-30: qty 11

## 2021-08-30 MED ORDER — REGADENOSON 0.4 MG/5ML IV SOLN
0.4000 mg | Freq: Once | INTRAVENOUS | Status: AC
  Administered 2021-08-30: 0.4 mg via INTRAVENOUS

## 2021-08-30 MED ORDER — TECHNETIUM TC 99M TETROFOSMIN IV KIT
33.0000 | PACK | Freq: Once | INTRAVENOUS | Status: AC | PRN
  Administered 2021-08-30: 33 via INTRAVENOUS
  Filled 2021-08-30: qty 33

## 2021-08-30 MED ORDER — PERFLUTREN LIPID MICROSPHERE
1.0000 mL | INTRAVENOUS | Status: AC | PRN
  Administered 2021-08-30: 3 mL via INTRAVENOUS

## 2021-08-30 NOTE — Telephone Encounter (Addendum)
Results called to patient who verbalizes understanding!  ? ? ? ?----- Message from Loel Dubonnet, NP sent at 08/30/2021  4:07 PM EDT ----- ?Stress test low risk.  Normal LVEF 53%.  No new ischemia. Good result.  ?

## 2021-09-01 ENCOUNTER — Telehealth (HOSPITAL_BASED_OUTPATIENT_CLINIC_OR_DEPARTMENT_OTHER): Payer: Self-pay

## 2021-09-01 NOTE — Telephone Encounter (Addendum)
Results called to patient who verbalizes understanding!  ? ? ? ? ?----- Message from Loel Dubonnet, NP sent at 08/31/2021  8:43 AM EDT ----- ?Echocardiogram shows normal heart pumping function. Heart muscle mildly stiff and thick. Continue optimal blood pressure control to prevent progression. No significant valvular abnormalities. Good result! ?

## 2021-09-28 ENCOUNTER — Emergency Department: Payer: Medicare Other

## 2021-09-28 ENCOUNTER — Other Ambulatory Visit: Payer: Self-pay

## 2021-09-28 ENCOUNTER — Encounter: Payer: Self-pay | Admitting: Emergency Medicine

## 2021-09-28 ENCOUNTER — Emergency Department
Admission: EM | Admit: 2021-09-28 | Discharge: 2021-09-28 | Disposition: A | Discharge status: Home / Self Care | Payer: Medicare Other | Attending: Emergency Medicine | Admitting: Emergency Medicine

## 2021-09-28 DIAGNOSIS — Z7982 Long term (current) use of aspirin: Secondary | ICD-10-CM | POA: Diagnosis not present

## 2021-09-28 DIAGNOSIS — I110 Hypertensive heart disease with heart failure: Secondary | ICD-10-CM | POA: Insufficient documentation

## 2021-09-28 DIAGNOSIS — Z7984 Long term (current) use of oral hypoglycemic drugs: Secondary | ICD-10-CM | POA: Insufficient documentation

## 2021-09-28 DIAGNOSIS — R1031 Right lower quadrant pain: Secondary | ICD-10-CM | POA: Diagnosis present

## 2021-09-28 DIAGNOSIS — I2510 Atherosclerotic heart disease of native coronary artery without angina pectoris: Secondary | ICD-10-CM | POA: Insufficient documentation

## 2021-09-28 DIAGNOSIS — Z955 Presence of coronary angioplasty implant and graft: Secondary | ICD-10-CM | POA: Insufficient documentation

## 2021-09-28 DIAGNOSIS — Z794 Long term (current) use of insulin: Secondary | ICD-10-CM | POA: Diagnosis not present

## 2021-09-28 DIAGNOSIS — E119 Type 2 diabetes mellitus without complications: Secondary | ICD-10-CM | POA: Diagnosis not present

## 2021-09-28 DIAGNOSIS — Z96653 Presence of artificial knee joint, bilateral: Secondary | ICD-10-CM | POA: Insufficient documentation

## 2021-09-28 DIAGNOSIS — Z7951 Long term (current) use of inhaled steroids: Secondary | ICD-10-CM | POA: Diagnosis not present

## 2021-09-28 DIAGNOSIS — Z79899 Other long term (current) drug therapy: Secondary | ICD-10-CM | POA: Diagnosis not present

## 2021-09-28 DIAGNOSIS — I5042 Chronic combined systolic (congestive) and diastolic (congestive) heart failure: Secondary | ICD-10-CM | POA: Diagnosis not present

## 2021-09-28 DIAGNOSIS — K760 Fatty (change of) liver, not elsewhere classified: Secondary | ICD-10-CM

## 2021-09-28 DIAGNOSIS — J449 Chronic obstructive pulmonary disease, unspecified: Secondary | ICD-10-CM | POA: Diagnosis not present

## 2021-09-28 LAB — CBC WITH DIFFERENTIAL/PLATELET
Abs Immature Granulocytes: 0.04 10*3/uL (ref 0.00–0.07)
Basophils Absolute: 0 10*3/uL (ref 0.0–0.1)
Basophils Relative: 0 %
Eosinophils Absolute: 0.1 10*3/uL (ref 0.0–0.5)
Eosinophils Relative: 1 %
HCT: 42.1 % (ref 39.0–52.0)
Hemoglobin: 14 g/dL (ref 13.0–17.0)
Immature Granulocytes: 0 %
Lymphocytes Relative: 22 %
Lymphs Abs: 2.6 10*3/uL (ref 0.7–4.0)
MCH: 28.7 pg (ref 26.0–34.0)
MCHC: 33.3 g/dL (ref 30.0–36.0)
MCV: 86.4 fL (ref 80.0–100.0)
Monocytes Absolute: 0.8 10*3/uL (ref 0.1–1.0)
Monocytes Relative: 6 %
Neutro Abs: 8.3 10*3/uL — ABNORMAL HIGH (ref 1.7–7.7)
Neutrophils Relative %: 71 %
Platelets: 219 10*3/uL (ref 150–400)
RBC: 4.87 MIL/uL (ref 4.22–5.81)
RDW: 13.8 % (ref 11.5–15.5)
WBC: 11.8 10*3/uL — ABNORMAL HIGH (ref 4.0–10.5)
nRBC: 0 % (ref 0.0–0.2)

## 2021-09-28 LAB — COMPREHENSIVE METABOLIC PANEL
ALT: 20 U/L (ref 0–44)
AST: 42 U/L — ABNORMAL HIGH (ref 15–41)
Albumin: 3.9 g/dL (ref 3.5–5.0)
Alkaline Phosphatase: 52 U/L (ref 38–126)
Anion gap: 8 (ref 5–15)
BUN: 15 mg/dL (ref 8–23)
CO2: 24 mmol/L (ref 22–32)
Calcium: 9.2 mg/dL (ref 8.9–10.3)
Chloride: 106 mmol/L (ref 98–111)
Creatinine, Ser: 1.16 mg/dL (ref 0.61–1.24)
GFR, Estimated: >60 mL/min (ref >60)
Glucose, Bld: 160 mg/dL — ABNORMAL HIGH (ref 70–99)
Potassium: 3.9 mmol/L (ref 3.5–5.1)
Sodium: 138 mmol/L (ref 135–145)
Total Bilirubin: 1.1 mg/dL (ref 0.3–1.2)
Total Protein: 8.2 g/dL — ABNORMAL HIGH (ref 6.5–8.1)

## 2021-09-28 LAB — URINALYSIS, ROUTINE W REFLEX MICROSCOPIC
Bilirubin Urine: NEGATIVE
Glucose, UA: NEGATIVE mg/dL
Hgb urine dipstick: NEGATIVE
Ketones, ur: NEGATIVE mg/dL
Leukocytes,Ua: NEGATIVE
Nitrite: NEGATIVE
Protein, ur: NEGATIVE mg/dL
Specific Gravity, Urine: 1.018 (ref 1.005–1.030)
pH: 5 (ref 5.0–8.0)

## 2021-09-28 LAB — CBG MONITORING, ED: Glucose-Capillary: 122 mg/dL — ABNORMAL HIGH (ref 70–99)

## 2021-09-28 LAB — LIPASE, BLOOD: Lipase: 35 U/L (ref 11–51)

## 2021-09-28 MED ORDER — IOHEXOL 300 MG/ML  SOLN
100.0000 mL | Freq: Once | INTRAMUSCULAR | Status: AC | PRN
  Administered 2021-09-28: 100 mL via INTRAVENOUS

## 2021-09-28 MED ORDER — MORPHINE SULFATE (PF) 4 MG/ML IV SOLN
4.0000 mg | Freq: Once | INTRAVENOUS | Status: AC
  Administered 2021-09-28: 4 mg via INTRAVENOUS
  Filled 2021-09-28: qty 1

## 2021-09-28 MED ORDER — ONDANSETRON HCL 4 MG/2ML IJ SOLN
4.0000 mg | Freq: Once | INTRAMUSCULAR | Status: AC
Start: 2021-09-28 — End: 2021-09-28
  Administered 2021-09-28: 4 mg via INTRAVENOUS
  Filled 2021-09-28: qty 2

## 2021-09-28 MED ORDER — OXYCODONE HCL 5 MG PO TABS
5.0000 mg | ORAL_TABLET | ORAL | Status: AC
  Administered 2021-09-28: 5 mg via ORAL
  Filled 2021-09-28: qty 1

## 2021-09-28 MED ORDER — SODIUM CHLORIDE 0.9 % IV SOLN: INTRAVENOUS | Status: DC

## 2021-09-28 MED ORDER — OXYCODONE HCL 5 MG PO TABS: 5.0000 mg | ORAL_TABLET | Freq: Four times a day (QID) | ORAL | 0 refills | Status: AC | PRN

## 2021-09-28 NOTE — ED Provider Notes (Signed)
Vitals:  ? Oct 12, 2021 0900 10-12-2021 1000  ?BP: (!) 165/90 (!) 164/85  ?Pulse: 78 76  ?Resp:    ?Temp:    ?SpO2:  97%  ? ? ? ?DG Abdomen 1 View ? ?Result Date: Oct 12, 2021 ?CLINICAL DATA:  Evaluate for free air. Lower abdominal pain that radiates around to right flank since Sunday. EXAM: ABDOMEN - 1 VIEW COMPARISON:  None Available. FINDINGS: The bowel gas pattern is normal. No radio-opaque calculi or other significant radiographic abnormality are seen. IMPRESSION: Negative. Electronically Signed   By: Kerby Moors M.D.   On: 10-12-21 06:00  ? ?CT ABDOMEN PELVIS W CONTRAST ? ?Result Date: 2021/10/12 ?CLINICAL DATA:  Acute, nonlocalized abdominal pain EXAM: CT ABDOMEN AND PELVIS WITH CONTRAST TECHNIQUE: Multidetector CT imaging of the abdomen and pelvis was performed using the standard protocol following bolus administration of intravenous contrast. RADIATION DOSE REDUCTION: This exam was performed according to the departmental dose-optimization program which includes automated exposure control, adjustment of the mA and/or kV according to patient size and/or use of iterative reconstruction technique. CONTRAST:  150m OMNIPAQUE IOHEXOL 300 MG/ML  SOLN COMPARISON:  Abdominal MRI 11/01/2017 FINDINGS: Lower chest: Extensive coronary atherosclerosis. Scarring or atelectasis at the right base. Hepatobiliary: Possible hepatic steatosis. No focal liver abnormality. There is some liver surface lobulation and large caudate lobe size.Cholelithiasis. No gallbladder dilatation or acute inflammatory changes. Pancreas: 1.9 cm pancreatic cyst at the body tail junction, dimensions similar to 2019 MRI. No acute finding Spleen: Unremarkable. Adrenals/Urinary Tract: Negative adrenals. No hydronephrosis or stone. Unremarkable bladder. Stomach/Bowel: No obstruction. No appendicitis. Scattered colonic diverticula without focal inflammation. Some mild reticulation of omentum which may be postoperative. Vascular/Lymphatic: No acute vascular  abnormality. No mass or adenopathy. Reproductive:No pathologic findings. Other: No ascites or pneumoperitoneum.  Small fatty midline hernia Musculoskeletal: No acute abnormalities. IMPRESSION: 1. No acute finding. 2. Possible cirrhosis, please correlate with risk factors. 3. 19 mm pancreatic cyst without detected change from 2019 MRI. 4. Cholelithiasis. Electronically Signed   By: JJorje GuildM.D.   On: 02023/09/2505:32  ? ?[place]ABDOM[place] LIMITED RUQ (LIVER/GB) ? ?Result Date: 525-May-2023?CLINICAL DATA:  Right-sided abdominal pain EXAM: ULTRASOUND ABDOMEN LIMITED RIGHT UPPER QUADRANT COMPARISON:  Previous studies including the CT done on 02023/05/25FINDINGS: Gallbladder: Gallbladder appears to be contracted and not optimally visualized for evaluation. There are possible small hyperechoic foci in gallbladder fossa. However, without fluid in the lumen of gallbladder possibility of gallbladder stones is difficult to evaluate. Technologist did not observe any focal tenderness in the gallbladder fossa. Common bile duct: Diameter: 4 mm Liver: There is increased echogenicity suggesting fatty infiltration. Portal vein is patent on color Doppler imaging with normal direction of blood flow towards the liver. Other: None. IMPRESSION: Gallbladder is contracted and not optimally evaluated. There is no dilation of bile ducts. Fatty liver. Electronically Signed   By: PElmer PickerM.D.   On: 02023/09/2506:44   ? ? ?Pain is improved at this time.  He is resting comfortably, but still has pain especially when he tries to sit up or move about.  Exam unchanged.  Suspect based on clinical history at this point will that my impression is that this may be musculoskeletal such as abdominal muscle strain, but the patient thankfully has reassuring imaging studies.  He is going to follow-up closely with his primary care doctor and actually has an appointment and will follow-up with Dr. TShirlean Kelly(spelling), his PCP tomorrow. ? ?Return  precautions and treatment recommendations and follow-up discussed with  the patient who is agreeable with the plan. ? ?I will prescribe the patient a narcotic pain medicine due to their condition which I anticipate will cause at least moderate pain short term. I discussed with the patient safe use of narcotic pain medicines, and that they are not to driveor ever take more than prescribed (no more than 1 pill every 6 hours). Patient is very agreeable to only use as prescribed and to never use more than prescribed. ? ?Patient is not driving home today.  Family driving him home ?  ?Delman Kitten, MD ?09/28/21 1105 ? ?

## 2021-09-28 NOTE — Discharge Instructions (Addendum)
You were seen in the emergency room for abdominal pain. It is important that you follow up closely with your primary care doctor as you have already planned for tomorrow. ? ?No driving today or while taking oxycodone.  Use only as prescribed. ? ?If you're unable to see her primary care doctor you may return to the emergency room or go to the [facility] walk-in clinic in 1 or 2 days for reexam. ? ?Please return to the emergency room right away if you are to develop a fever, severe nausea, your pain becomes severe or worsens, you are unable to keep food down, begin vomiting any dark or bloody fluid, you develop any dark or bloody stools, feel dehydrated, or other new concerns or symptoms arise. ? ?

## 2021-09-28 NOTE — ED Triage Notes (Signed)
Pt to triage via w/c; reports lower abd pain that radiates around to rt side and flank since Sunday; denies any accomp symptoms; denies hx of same ?

## 2021-09-28 NOTE — ED Provider Notes (Signed)
Vitals:  ? 09/28/21 0545 09/28/21 0625  ?BP: 136/61 136/61  ?Pulse: 83   ?Resp: (!) 28 18  ?Temp:    ?SpO2: 96%   ? ? ?Patient examined by me.  Reports that he felt like he had a pulled muscle from along his right side and felt some muscle spasms.  Last night the phone rang during the middle of the night and he woke up abruptly and when he sat up he immediately felt like a pull or tearing feeling in his middle right to the right lower abdomen. ? ?Has been seen by Dr. Leonides Schanz his CT imaging has been completed as below ? ?DG Abdomen 1 View ? ?Result Date: 09/28/2021 ?CLINICAL DATA:  Evaluate for free air. Lower abdominal pain that radiates around to right flank since Sunday. EXAM: ABDOMEN - 1 VIEW COMPARISON:  None Available. FINDINGS: The bowel gas pattern is normal. No radio-opaque calculi or other significant radiographic abnormality are seen. IMPRESSION: Negative. Electronically Signed   By: Kerby Moors M.D.   On: 09/28/2021 06:00  ? ?CT ABDOMEN PELVIS W CONTRAST ? ?Result Date: 09/28/2021 ?CLINICAL DATA:  Acute, nonlocalized abdominal pain EXAM: CT ABDOMEN AND PELVIS WITH CONTRAST TECHNIQUE: Multidetector CT imaging of the abdomen and pelvis was performed using the standard protocol following bolus administration of intravenous contrast. RADIATION DOSE REDUCTION: This exam was performed according to the departmental dose-optimization program which includes automated exposure control, adjustment of the mA and/or kV according to patient size and/or use of iterative reconstruction technique. CONTRAST:  1104m OMNIPAQUE IOHEXOL 300 MG/ML  SOLN COMPARISON:  Abdominal MRI 11/01/2017 FINDINGS: Lower chest: Extensive coronary atherosclerosis. Scarring or atelectasis at the right base. Hepatobiliary: Possible hepatic steatosis. No focal liver abnormality. There is some liver surface lobulation and large caudate lobe size.Cholelithiasis. No gallbladder dilatation or acute inflammatory changes. Pancreas: 1.9 cm pancreatic cyst  at the body tail junction, dimensions similar to 2019 MRI. No acute finding Spleen: Unremarkable. Adrenals/Urinary Tract: Negative adrenals. No hydronephrosis or stone. Unremarkable bladder. Stomach/Bowel: No obstruction. No appendicitis. Scattered colonic diverticula without focal inflammation. Some mild reticulation of omentum which may be postoperative. Vascular/Lymphatic: No acute vascular abnormality. No mass or adenopathy. Reproductive:No pathologic findings. Other: No ascites or pneumoperitoneum.  Small fatty midline hernia Musculoskeletal: No acute abnormalities. IMPRESSION: 1. No acute finding. 2. Possible cirrhosis, please correlate with risk factors. 3. 19 mm pancreatic cyst without detected change from 2019 MRI. 4. Cholelithiasis. Electronically Signed   By: JJorje GuildM.D.   On: 09/28/2021 07:32   ? ?On exam he is alert and well oriented.  He does appear in pain holding his hand over his right lower to right mid abdomen.  There is no hernias or masses noted.  He does report notable tenderness along the inferior margin of the right rectus muscle.  He is fairly comfortable when laying still but if I asked him to sit up or flex his hips he reports the pain comes about as very sharp along his right abdominal wall. ? ?We will give additional pain medication.  Is quite painful for him to move but seems more comfortable when sitting still.  At this point I suspect this may be representative of a abdominal muscular etiology such as muscle tear or strain.  His CT imaging does not reveal any acute intra-abdominal findings of possible cirrhosis is noted. ? ?Patient pending right upper quadrant ultrasound, and also I have ordered additional morphine for pain.  He is fully alert well oriented.  In no  acute distress but does appear to have ongoing pain especially with movement right lower quadrant. ?  Delman Kitten, MD ?09/28/21 [phone] ? ?
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
## 2021-12-06 ENCOUNTER — Encounter: Payer: Self-pay | Admitting: Interventional Cardiology

## 2021-12-06 ENCOUNTER — Encounter: Payer: Self-pay | Admitting: Neurology

## 2021-12-06 ENCOUNTER — Ambulatory Visit: Payer: Medicare Other | Admitting: Interventional Cardiology

## 2021-12-06 VITALS — BP 136/70 | HR 76 | Ht 69.0 in | Wt 270.0 lb

## 2021-12-06 DIAGNOSIS — I10 Essential (primary) hypertension: Secondary | ICD-10-CM | POA: Diagnosis not present

## 2021-12-06 DIAGNOSIS — I25118 Atherosclerotic heart disease of native coronary artery with other forms of angina pectoris: Secondary | ICD-10-CM | POA: Diagnosis not present

## 2021-12-06 DIAGNOSIS — E785 Hyperlipidemia, unspecified: Secondary | ICD-10-CM

## 2021-12-06 DIAGNOSIS — E1159 Type 2 diabetes mellitus with other circulatory complications: Secondary | ICD-10-CM

## 2021-12-06 DIAGNOSIS — R251 Tremor, unspecified: Secondary | ICD-10-CM

## 2021-12-06 DIAGNOSIS — I5032 Chronic diastolic (congestive) heart failure: Secondary | ICD-10-CM

## 2021-12-06 DIAGNOSIS — Z794 Long term (current) use of insulin: Secondary | ICD-10-CM

## 2021-12-06 DIAGNOSIS — I471 Supraventricular tachycardia: Secondary | ICD-10-CM | POA: Diagnosis not present

## 2021-12-06 DIAGNOSIS — G4733 Obstructive sleep apnea (adult) (pediatric): Secondary | ICD-10-CM

## 2021-12-06 LAB — HEPATIC FUNCTION PANEL
ALT: 19 IU/L (ref 0–44)
AST: 49 IU/L — ABNORMAL HIGH (ref 0–40)
Albumin: 4.2 g/dL (ref 3.8–4.8)
Alkaline Phosphatase: 59 IU/L (ref 44–121)
Bilirubin Total: 0.8 mg/dL (ref 0.0–1.2)
Bilirubin, Direct: 0.27 mg/dL (ref 0.00–0.40)
Total Protein: 7 g/dL (ref 6.0–8.5)

## 2021-12-06 LAB — LIPID PANEL
Chol/HDL Ratio: 2.2 ratio (ref 0.0–5.0)
Cholesterol, Total: 96 mg/dL — ABNORMAL LOW (ref 100–199)
HDL: 43 mg/dL (ref >39)
LDL Chol Calc (NIH): 35 mg/dL (ref 0–99)
Triglycerides: 90 mg/dL (ref 0–149)
VLDL Cholesterol Cal: 18 mg/dL (ref 5–40)

## 2021-12-06 NOTE — Addendum Note (Signed)
Addended by: Thompson Grayer on: 12/06/2021 09:53 AM   Modules accepted: Orders

## 2021-12-06 NOTE — Patient Instructions (Signed)
Medication Instructions:  Your physician recommends that you continue on your current medications as directed. Please refer to the Current Medication list given to you today.  *If you need a refill on your cardiac medications before your next appointment, please call your pharmacy*   Lab Work: Lab work to be done today--Lipid and Liver profiles If you have labs (blood work) drawn today and your tests are completely normal, you will receive your results only by: MyChart Message (if you have MyChart) OR A paper copy in the mail If you have any lab test that is abnormal or we need to change your treatment, we will call you to review the results.   Testing/Procedures: none   Follow-Up: At [facility], you and your health needs are our priority.  As part of our continuing mission to provide you with exceptional heart care, we have created designated Provider Care Teams.  These Care Teams include your primary Cardiologist (physician) and Advanced Practice Providers (APPs -  Physician Assistants and Nurse Practitioners) who all work together to provide you with the care you need, when you need it.  We recommend signing up for the patient portal called "MyChart".  Sign up information is provided on this After Visit Summary.  MyChart is used to connect with patients for Virtual Visits (Telemedicine).  Patients are able to view lab/test results, encounter notes, upcoming appointments, etc.  Non-urgent messages can be sent to your provider as well.   To learn more about what you can do with MyChart, go to [email or web address].    Your next appointment:   12 month(s)  The format for your next appointment:   In Person  Provider:   Larae Grooms, MD     Other Instructions You have been referred to Dr Minus Liberty neurology   Important Information About Sugar

## 2022-01-16 NOTE — Progress Notes (Unsigned)
Assessment/Plan:   Essential Tremor.  -This is evidenced by the symmetrical nature and longstanding hx of gradually getting worse.  We discussed nature and pathophysiology.  We discussed that this can continue to gradually get worse with time.  We discussed that some medications can worsen this, as can caffeine use.  We discussed medication therapy as well as surgical therapy.  Ultimately, the patient decided to trial primidone and work to 50 mg twice per day.  Risk, benefits, side effects discussed.  -Discussed that his albuterol likely is contributing to tremor, although it is likely not the only source.  He is currently using this on a twice daily basis.  -Patient is already on beta-blockers (Coreg and metoprolol), but they are likely not helping tremor at the dosages he is on.  However, we would not be able to add further beta-blocker  -No evidence of a neurodegenerative process.  -He asks about the trigger finger and if this would be related to essential tremor.  I told him it would not, and that he would need to follow-up with primary care or his orthopedic surgeon.  2.  Patient will follow-up with me in the next 5 to 6 months, sooner should new neurologic issues arise.  Subjective:   James Maxwell was seen today in the movement disorders clinic for neurologic consultation at the request of Jettie Booze, MD.  The consultation is for the evaluation of tremor.  Pt previously saw Dr. Manuella Ghazi at [facility] [facility] for the same.  Outside records that were made available to me were reviewed.  First saw Dr. Manuella Ghazi for this in February, 2022 along with carpal tunnel.  While he was diagnosed with essential tremor, I do not see that any medication was given at that visit.  I also do not see that he has had any follow-up appointments since that time.  He saw his cardiologist on July 19 and mentioned that he did not like to go to restaurants because of tremor.  Falls were noted as well as resting tremor in both  hands.  He was sent here for further evaluation.  Tremor: Yes.     How long has it been going on? Started in his 79s, but worse with time  At rest or with activation?  both  When is it noted the most?  Eating, writing  Fam hx of tremor?  No.  Located where?  Bilateral UE  Affected by caffeine:  doesn't drink any  Affected by alcohol:  doesn't drink any  Affected by stress:  Yes.    Affected by fatigue:  Yes.    Spills soup if on spoon:  may or may not  Spills glass of liquid if full:  may have to hold with 2 hands  Affects ADL's (tying shoes, brushing teeth, etc):  No.  Tremor inducing meds:  Yes.   Albuterol (using it twice daily)  Tremor improving medications: On carvedilol and metoprolol  Other Specific Symptoms:  Voice: no change Postural symptoms:  Yes.    Falls?  Yes.  , last fall was 1 month ago; missed curb and fell; week before that had a fall going up steps on deck at home (attributes falls to bad knees) Bradykinesia symptoms: difficulty getting out of a chair; no shuffling Loss of smell:  No. Loss of taste:  No. Urinary Incontinence:  No. Difficulty Swallowing:  No. Handwriting, micrographia: No. Diplopia:  No.; has blurry vision   Neuroimaging of the brain has not previously been performed.  ALLERGIES:   Allergies  Allergen Reactions   Lipitor [Atorvastatin]     myalgias    CURRENT MEDICATIONS:  Current Outpatient Medications  Medication Instructions   acarbose (PRECOSE) 50 mg, Oral, 3 times daily   ACCU-CHEK AVIVA PLUS test strip No dose, route, or frequency recorded.   ACCU-CHEK SOFTCLIX LANCETS lancets No dose, route, or frequency recorded.   albuterol (PROVENTIL HFA;VENTOLIN HFA) 108 (90 Base) MCG/ACT inhaler 1-2 puffs, Inhalation, Every 4 hours PRN   albuterol (PROVENTIL) 2.5 mg, Nebulization, Every 4 hours PRN   Alcohol Swabs (B-D SINGLE USE SWABS REGULAR) PADS No dose, route, or frequency recorded.   amLODipine (NORVASC) 5 mg, Oral, Daily,  Please make yearly appt with Dr. Irish Lack for February 2023 for future refills. Thank you 1st attempt   ascorbic acid (VITAMIN C) 500 mg, Oral, Daily   aspirin EC 81 mg, Oral, Daily   B-D ULTRAFINE III SHORT PEN 31G X 8 MM MISC SMARTSIG:injection Daily   carvedilol (COREG) 25 mg, Oral, 2 times daily with meals   cetirizine (ZYRTEC) 10 mg, Oral, Daily   clopidogrel (PLAVIX) 75 MG tablet TAKE 1 TABLET BY MOUTH EVERY DAY   Fiasp FlexTouch 0-9 Units, Subcutaneous, 3 times daily with meals, Sliding Scale Insulin    fluticasone (FLONASE) 50 MCG/ACT nasal spray 1 spray, Each Nare, Daily PRN   furosemide (LASIX) 40 mg, Oral, Daily   hydrALAZINE (APRESOLINE) 50 mg, Oral, 2 times daily   insulin glargine (LANTUS) 65 Units, Subcutaneous, Daily at bedtime   Insulin Lispro (HUMALOG KWIKPEN ) 3-9 Units, Subcutaneous, Daily, Sliding scale   isosorbide dinitrate (ISORDIL) 30 MG tablet Oral   KLOR-CON M20 20 MEQ tablet TAKE 1 TABLET BY MOUTH EVERY DAY   losartan (COZAAR) 100 mg, Oral, Daily   metFORMIN (GLUCOPHAGE) 1,000 mg, Oral, 2 times daily, Restart on 01/30/2019   metoprolol succinate (TOPROL-XL) 50 MG 24 hr tablet 1 tablet, Oral, Daily   montelukast (SINGULAIR) 10 mg, Oral, Every morning   nitroGLYCERIN (NITROSTAT) 0.4 mg, Sublingual, Every 5 min PRN   Omega-3 500 mg, Oral, Daily   ondansetron (ZOFRAN ODT) 4 mg, Oral, Every 8 hours PRN   pantoprazole (PROTONIX) 40 mg, Oral, Daily   rosuvastatin (CRESTOR) 20 mg, Oral, Daily   sildenafil (REVATIO) 20 MG tablet Take 3-5 tablets by mouth 1 hour prior to sexual activity as needed    TRELEGY ELLIPTA 100-62.5-25 MCG/ACT AEPB SMARTSIG:1 inhalation Via Inhaler Every Morning   Vitamin D3 5,000 Units, Oral, Daily   vitamin E 400 Units, Oral, Daily   Zinc 50 mg, Oral, Daily    Objective:   PHYSICAL EXAMINATION:    VITALS:   Vitals:   01/18/22 1305  BP: (!) 160/78  Pulse: 90  SpO2: 97%  Weight: 267 lb (121.1 kg)  Height: '5\' 9"'$  (1.753 m)    GEN:   The patient appears stated age and is in NAD. HEENT:  Normocephalic, atraumatic.  The mucous membranes are moist. The superficial temporal arteries are without ropiness or tenderness. CV:  RRR Lungs:  CTAB Neck/HEME:  There are no carotid bruits bilaterally.  Neurological examination:  Orientation: The patient is alert and oriented x3.  Cranial nerves: There is good facial symmetry.  Extraocular muscles are intact. The visual fields are full to confrontational testing. The speech is fluent and clear. Soft palate rises symmetrically and there is no tongue deviation. Hearing is intact to conversational tone. Sensation: Sensation is intact to light touch throughout (facial, trunk, extremities). Vibration is intact  at the bilateral big toe but it is decreased. There is no extinction with double simultaneous stimulation.  Motor: Strength is 5/5 in the bilateral upper and lower extremities.   Shoulder shrug is equal and symmetric.  There is no pronator drift. Deep tendon reflexes: Deep tendon reflexes are 0-1/4 at the bilateral biceps, triceps, brachioradialis, patella and achilles. Plantar responses are downgoing bilaterally.  Movement examination: Tone: There is normal tone in the bilateral upper extremities.  The tone in the lower extremities is normal.  Abnormal movements: rest tremor of the bilateral LE.  No rest tremor of UE.  There is postural tremor, mild bilaterally.  There is mild to mod intention tremor.  He has some trouble with Archimedes spirals (most of the trouble is getting the pen on the paper).  He has definite trouble pouring water from 1 glass to another. Coordination:  There is no decremation with RAM's, with any form of RAMS, including alternating supination and pronation of the forearm, hand opening and closing, finger taps, heel taps and toe taps. Gait and Station: The patient has pushes off of the chair to arise.  He is just slightly wide-based.  There is no shuffling.  I have  reviewed and interpreted the following labs independently   Chemistry      Component Value Date/Time   NA 138 09/28/2021 0534   NA 142 04/13/2020 0714   K 3.9 09/28/2021 0534   CL 106 09/28/2021 0534   CO2 24 09/28/2021 0534   BUN 15 09/28/2021 0534   BUN 15 04/13/2020 0714   CREATININE 1.16 09/28/2021 0534   CREATININE 1.31 02/03/2013 1822      Component Value Date/Time   CALCIUM 9.2 09/28/2021 0534   ALKPHOS 59 12/06/2021 0933   AST 49 (H) 12/06/2021 0933   ALT 19 12/06/2021 0933   BILITOT 0.8 12/06/2021 0933      Lab Results  Component Value Date   TSH 1.280 04/05/2020   Lab Results  Component Value Date   WBC 11.8 (H) 09/28/2021   HGB 14.0 09/28/2021   HCT 42.1 09/28/2021   MCV 86.4 09/28/2021   PLT 219 09/28/2021      Total time spent on today's visit was 45 minutes, including both face-to-face time and nonface-to-face time.  Time included that spent on review of records (prior notes available to me/labs/imaging if pertinent), discussing treatment and goals, answering patient's questions and coordinating care.  Cc:  Jodi Marble, MD

## 2022-01-18 ENCOUNTER — Ambulatory Visit: Payer: Medicare Other | Admitting: Neurology

## 2022-01-18 ENCOUNTER — Encounter: Payer: Self-pay | Admitting: Neurology

## 2022-01-18 VITALS — BP 160/78 | HR 90 | Ht 69.0 in | Wt 267.0 lb

## 2022-01-18 DIAGNOSIS — G250 Essential tremor: Secondary | ICD-10-CM | POA: Diagnosis not present

## 2022-01-18 MED ORDER — PRIMIDONE 50 MG PO TABS
50.0000 mg | ORAL_TABLET | Freq: Two times a day (BID) | ORAL | 1 refills | Status: DC
Start: 2022-01-18 — End: 2022-08-06

## 2022-01-18 NOTE — Patient Instructions (Addendum)
Start primidone 50 mg - 1/2 tablet at bedtime for 1 week and then increase to 1 tablet at bedtime x 2 weeks, and then 1 tablet twice per day thereafter  The physicians and staff at [facility] are committed to providing excellent care. You may receive a survey requesting feedback about your experience at our office. We strive to receive "very good" responses to the survey questions. If you feel that your experience would prevent you from giving the office a "very good " response, please contact our office to try to remedy the situation. We may be reached at [phone]. Thank you for taking the time out of your busy day to complete the survey.

## 2022-01-19 ENCOUNTER — Telehealth: Payer: Self-pay | Admitting: *Deleted

## 2022-01-19 NOTE — Telephone Encounter (Signed)
Left message to call office

## 2022-01-19 NOTE — Telephone Encounter (Signed)
-----   Message from Jettie Booze, MD sent at 01/18/2022 10:20 PM EDT ----- Please verify his meds.  He should not be on both Coreg and Metoprolol.  ----- Message ----- From: Ludwig Clarks, DO Sent: 01/18/2022   1:56 PM EDT To: Jettie Booze, MD

## 2022-02-01 NOTE — Telephone Encounter (Signed)
Does he have recent BP readings?  BP was high at neurologist.  Coreg would also help tremor.  Would like start Coreg back depending on BP readings.

## 2022-02-01 NOTE — Telephone Encounter (Signed)
I spoke with patient he is not taking Coreg or Metoprolol.  I told him we would call him back if any changes needed

## 2022-02-02 MED ORDER — ROSUVASTATIN CALCIUM 20 MG PO TABS: 20.0000 mg | ORAL_TABLET | Freq: Every day | ORAL | 3 refills | Status: DC

## 2022-02-02 NOTE — Telephone Encounter (Signed)
Left message to call office

## 2022-02-02 NOTE — Telephone Encounter (Signed)
Patient has BP cuff at home but has not been checking recently.  He will start checking BP and pulse daily about 2 hours after taking AM medications.  He will call readings to office in about a week.

## 2022-02-05 ENCOUNTER — Other Ambulatory Visit: Payer: Self-pay

## 2022-02-05 ENCOUNTER — Other Ambulatory Visit: Payer: Self-pay | Admitting: Gastroenterology

## 2022-02-05 ENCOUNTER — Encounter: Payer: Self-pay | Admitting: Gastroenterology

## 2022-02-05 ENCOUNTER — Ambulatory Visit (INDEPENDENT_AMBULATORY_CARE_PROVIDER_SITE_OTHER): Payer: Medicare Other | Admitting: Gastroenterology

## 2022-02-05 VITALS — BP 146/75 | HR 89 | Temp 98.3°F | Ht 69.0 in | Wt 264.6 lb

## 2022-02-05 DIAGNOSIS — Z1211 Encounter for screening for malignant neoplasm of colon: Secondary | ICD-10-CM | POA: Insufficient documentation

## 2022-02-05 DIAGNOSIS — Z800 Family history of malignant neoplasm of digestive organs: Secondary | ICD-10-CM | POA: Insufficient documentation

## 2022-02-05 DIAGNOSIS — K862 Cyst of pancreas: Secondary | ICD-10-CM | POA: Diagnosis not present

## 2022-02-05 DIAGNOSIS — K8020 Calculus of gallbladder without cholecystitis without obstruction: Secondary | ICD-10-CM | POA: Diagnosis not present

## 2022-02-05 DIAGNOSIS — Z860100 Personal history of colon polyps, unspecified: Secondary | ICD-10-CM | POA: Insufficient documentation

## 2022-02-05 DIAGNOSIS — K7460 Unspecified cirrhosis of liver: Secondary | ICD-10-CM

## 2022-02-05 DIAGNOSIS — K760 Fatty (change of) liver, not elsewhere classified: Secondary | ICD-10-CM | POA: Insufficient documentation

## 2022-02-05 DIAGNOSIS — I8500 Esophageal varices without bleeding: Secondary | ICD-10-CM
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
## 2022-02-05 NOTE — Patient Instructions (Addendum)
Please do not eat or drink anything 4 hours prior to MRI. Please arrive 30 minutes prior to your MRI. If the date and time does not work for you, please call [phone] to reschedule.  We will contact you to let you know how many days you are to hold your Plavix and when to restart it.  For your diabetes medication Metformin, please hold it 2 days prior and for your insulin, you will do half your regular dosage the night before.

## 2022-02-06 ENCOUNTER — Telehealth: Payer: Self-pay

## 2022-02-06 NOTE — Telephone Encounter (Signed)
Called patient but had to leave him a detailed message letting him know what she is to do with his Plavix.

## 2022-02-06 NOTE — Telephone Encounter (Signed)
-----   Message from Jettie Booze, MD sent at 02/05/2022  3:22 PM EDT ----- Regarding: RE: Plavix hold OK to hold Plavix 5 days prior to EGD.  Restart date depends on results of EGD, if there was ulcer or were biopsies taken.  ----- Message ----- From: Wayna Chalet, CMA Sent: 02/05/2022   2:15 PM EDT To: Jettie Booze, MD Subject: Plavix hold                                    Hello Dr. Irish Lack. Patient will have a procedure done on 02/12/2022 for EGD. Please let me know how many days patient could hold his Plavix and when to restart it. Thank you.

## 2022-02-07 ENCOUNTER — Telehealth: Payer: Self-pay | Admitting: Physician Assistant

## 2022-02-07 LAB — HEPATITIS B SURFACE ANTIBODY,QUALITATIVE: Hep B Surface Ab, Qual: NONREACTIVE

## 2022-02-07 LAB — ANA: Anti Nuclear Antibody (ANA): NEGATIVE

## 2022-02-07 LAB — ALPHA-1-ANTITRYPSIN: A-1 Antitrypsin: 166 mg/dL (ref 101–187)

## 2022-02-07 LAB — MITOCHONDRIAL/SMOOTH MUSCLE AB PNL
Mitochondrial Ab: <20 Units (ref 0.0–20.0)
Smooth Muscle Ab: 9 Units (ref 0–19)

## 2022-02-07 LAB — HEPATITIS B CORE ANTIBODY, TOTAL: Hep B Core Total Ab: NEGATIVE

## 2022-02-07 LAB — IRON,TIBC AND FERRITIN PANEL
Ferritin: 55 ng/mL (ref 30–400)
Iron Saturation: 17 % (ref 15–55)
Iron: 61 ug/dL (ref 38–169)
Total Iron Binding Capacity: 358 ug/dL (ref 250–450)
UIBC: 297 ug/dL (ref 111–343)

## 2022-02-07 LAB — IMMUNOGLOBULINS A/E/G/M, SERUM
IgA/Immunoglobulin A, Serum: 478 mg/dL — ABNORMAL HIGH (ref 61–437)
IgE (Immunoglobulin E), Serum: 715 IU/mL — ABNORMAL HIGH (ref 6–495)
IgG (Immunoglobin G), Serum: 1493 mg/dL (ref 603–1613)
IgM (Immunoglobulin M), Srm: 93 mg/dL (ref 15–143)

## 2022-02-07 LAB — CK: Total CK: 125 U/L (ref 41–331)

## 2022-02-07 LAB — HEPATITIS B SURFACE ANTIGEN: Hepatitis B Surface Ag: NEGATIVE

## 2022-02-07 LAB — HEPATITIS A ANTIBODY, TOTAL: hep A Total Ab: NEGATIVE

## 2022-02-07 LAB — ANTI-MICROSOMAL ANTIBODY LIVER / KIDNEY: LKM1 Ab: 0.8 Units (ref 0.0–20.0)

## 2022-02-07 LAB — CELIAC DISEASE AB SCREEN W/RFX
Antigliadin Abs, IgA: 4 units (ref 0–19)
Transglutaminase IgA: <2 U/mL (ref 0–3)

## 2022-02-07 LAB — HEPATITIS B E ANTIGEN: Hep B E Ag: NEGATIVE

## 2022-02-07 LAB — HEPATITIS C ANTIBODY: Hep C Virus Ab: NONREACTIVE

## 2022-02-07 LAB — CERULOPLASMIN: Ceruloplasmin: 27.4 mg/dL (ref 16.0–31.0)

## 2022-02-07 LAB — HIV ANTIBODY (ROUTINE TESTING W REFLEX): HIV Screen 4th Generation wRfx: NONREACTIVE

## 2022-02-07 LAB — HEPATITIS B E ANTIBODY: Hep B E Ab: NEGATIVE

## 2022-02-07 NOTE — Telephone Encounter (Signed)
I am coming on board today to cover preop. We received forwarded staff message as below. Dr. Irish Lack has already responded to clearance so no additional needs at this time. Did reach out to GI CMA to relay use of the dotphrases/templates and preop box for future use instead of sending to MD. GI has already reached out to patient in separate phone note. No additional input needed from preop APP team.

## 2022-02-07 NOTE — Telephone Encounter (Signed)
-----   Message from Thompson Grayer, RN sent at 02/07/2022  4:27 PM EDT ----- Regarding: FW: Plavix hold  ----- Message ----- From: Jettie Booze, MD Sent: 02/05/2022   3:23 PM EDT To: Thompson Grayer, RN; Wayna Chalet, CMA Subject: RE: Plavix hold                                OK to hold Plavix 5 days prior to EGD.  Restart date depends on results of EGD, if there was ulcer or were biopsies taken.  ----- Message ----- From: Wayna Chalet, CMA Sent: 02/05/2022   2:15 PM EDT To: Jettie Booze, MD Subject: Plavix hold                                    Hello Dr. Irish Lack. Patient will have a procedure done on 02/12/2022 for EGD. Please let me know how many days patient could hold his Plavix and when to restart it. Thank you.

## 2022-02-07 NOTE — Telephone Encounter (Signed)
Called patient but had to leave him a voicemail again letting him know that he needs a hepatitis A/B vaccine. I told him that he is able to call us and schedule a nurse's visit or receive it at his PCP's office when he goes back to him. I stated to call us if he had further questions.

## 2022-02-07 NOTE — Progress Notes (Signed)
Needs hep A/b vaccine

## 2022-02-12 ENCOUNTER — Encounter: Payer: Self-pay | Admitting: Gastroenterology

## 2022-02-12 ENCOUNTER — Ambulatory Visit: Payer: Medicare Other

## 2022-02-12 ENCOUNTER — Ambulatory Visit
Admission: RE | Admit: 2022-02-12 | Discharge: 2022-02-12 | Disposition: A | Discharge status: Home / Self Care | Payer: Medicare Other | Source: Ambulatory Visit | Attending: Gastroenterology | Admitting: Gastroenterology

## 2022-02-12 ENCOUNTER — Encounter
Admission: RE | Disposition: A | Discharge status: Home / Self Care | Payer: Self-pay | Source: Ambulatory Visit | Attending: Gastroenterology

## 2022-02-12 DIAGNOSIS — I25119 Atherosclerotic heart disease of native coronary artery with unspecified angina pectoris: Secondary | ICD-10-CM | POA: Diagnosis not present

## 2022-02-12 DIAGNOSIS — E119 Type 2 diabetes mellitus without complications: Secondary | ICD-10-CM | POA: Insufficient documentation

## 2022-02-12 DIAGNOSIS — I110 Hypertensive heart disease with heart failure: Secondary | ICD-10-CM | POA: Diagnosis not present

## 2022-02-12 DIAGNOSIS — K2970 Gastritis, unspecified, without bleeding: Secondary | ICD-10-CM | POA: Diagnosis not present

## 2022-02-12 DIAGNOSIS — I5042 Chronic combined systolic (congestive) and diastolic (congestive) heart failure: Secondary | ICD-10-CM | POA: Insufficient documentation

## 2022-02-12 DIAGNOSIS — I8510 Secondary esophageal varices without bleeding: Secondary | ICD-10-CM | POA: Diagnosis present

## 2022-02-12 DIAGNOSIS — K7469 Other cirrhosis of liver: Secondary | ICD-10-CM

## 2022-02-12 DIAGNOSIS — K219 Gastro-esophageal reflux disease without esophagitis: Secondary | ICD-10-CM | POA: Diagnosis not present

## 2022-02-12 DIAGNOSIS — I252 Old myocardial infarction: Secondary | ICD-10-CM | POA: Diagnosis not present

## 2022-02-12 DIAGNOSIS — K7460 Unspecified cirrhosis of liver: Secondary | ICD-10-CM | POA: Insufficient documentation

## 2022-02-12 DIAGNOSIS — I8500 Esophageal varices without bleeding: Secondary | ICD-10-CM

## 2022-02-12 HISTORY — PX: ESOPHAGOGASTRODUODENOSCOPY (EGD) WITH PROPOFOL: SHX5813

## 2022-02-12 LAB — GLUCOSE, CAPILLARY: Glucose-Capillary: 125 mg/dL — ABNORMAL HIGH (ref 70–99)

## 2022-02-12 SURGERY — ESOPHAGOGASTRODUODENOSCOPY (EGD) WITH PROPOFOL
Anesthesia: General

## 2022-02-12 MED ORDER — PROPOFOL 10 MG/ML IV BOLUS
INTRAVENOUS | Status: DC | PRN
  Administered 2022-02-12: 20 mg via INTRAVENOUS
  Administered 2022-02-12: 60 mg via INTRAVENOUS

## 2022-02-12 MED ORDER — SODIUM CHLORIDE 0.9 % IV SOLN: INTRAVENOUS | Status: DC

## 2022-02-12 MED ORDER — PROPOFOL 500 MG/50ML IV EMUL
INTRAVENOUS | Status: DC | PRN
  Administered 2022-02-12: 150 ug/kg/min via INTRAVENOUS

## 2022-02-12 MED ORDER — GLYCOPYRROLATE 0.2 MG/ML IJ SOLN
INTRAMUSCULAR | Status: DC | PRN
  Administered 2022-02-12: .2 mg via INTRAVENOUS

## 2022-02-12 MED ORDER — LIDOCAINE HCL (CARDIAC) PF 100 MG/5ML IV SOSY
PREFILLED_SYRINGE | INTRAVENOUS | Status: DC | PRN
  Administered 2022-02-12: 100 mg via INTRAVENOUS

## 2022-02-12 NOTE — Transfer of Care (Signed)
Immediate Anesthesia Transfer of Care Note  Patient: James Maxwell  Procedure(s) Performed: ESOPHAGOGASTRODUODENOSCOPY (EGD) WITH PROPOFOL  Patient Location: Endoscopy Unit  Anesthesia Type:General  Level of Consciousness: drowsy  Airway & Oxygen Therapy: Patient Spontanous Breathing  Post-op Assessment: Report given to RN and Post -op Vital signs reviewed and stable  Post vital signs: Reviewed and stable  Last Vitals:  Vitals Value Taken Time  BP 148/80 02/12/22 0901  Temp 36 C 02/12/22 0901  Pulse 93 02/12/22 0902  Resp 22 02/12/22 0902  SpO2 96 % 02/12/22 0902  Vitals shown include unvalidated device data.  Last Pain:  Vitals:   02/12/22 0901  TempSrc: Temporal  PainSc: 0-No pain         Complications: No notable events documented.
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
## 2022-02-12 NOTE — Op Note (Signed)
[facility] Gastroenterology Patient Name: James Maxwell Procedure Date: 02/12/2022 8:44 AM MRN: [id number] Account #: [id number] Date of Birth: [birth year] Admit Type: Outpatient Age: 71 Room: [facility] ENDO ROOM 1 Gender: Male Note Status: Finalized Instrument Name: Upper Endoscope [id number] Procedure:             Upper GI endoscopy Indications:           Cirrhosis rule out esophageal varices Providers:             [name] MD, MD Referring MD:          Venetia Maxon. Elijio Miles, MD (Referring MD) Medicines:             Monitored Anesthesia Care Complications:         No immediate complications. Procedure:             Pre-Anesthesia Assessment:                        - Prior to the procedure, a History and Physical was                         performed, and patient medications, allergies and                         sensitivities were reviewed. The patient's tolerance                         of previous anesthesia was reviewed.                        - The risks and benefits of the procedure and the                         sedation options and risks were discussed with the                         patient. All questions were answered and informed                         consent was obtained.                        - ASA Grade Assessment: III - A patient with severe                         systemic disease.                        After obtaining informed consent, the endoscope was                         passed under direct vision. Throughout the procedure,                         the patient's blood pressure, pulse, and oxygen                         saturations were monitored continuously. The Endoscope  was introduced through the mouth, and advanced to the                         third part of duodenum. The upper GI endoscopy was                         accomplished without difficulty. The patient tolerated                         the procedure  well. Findings:      The esophagus was normal.      The examined duodenum was normal.      Localized mild inflammation characterized by congestion (edema) and       erythema was found in the gastric antrum.      The cardia and gastric fundus were normal on retroflexion. Impression:            - Normal esophagus.                        - Normal examined duodenum.                        - Gastritis.                        - No specimens collected. Recommendation:        - Discharge patient to home (with escort).                        - Resume previous diet.                        - Continue present medications.                        - Repeat upper endoscopy in 3 years for surveillance. Procedure Code(s):     --- Professional ---                        [phone], Esophagogastroduodenoscopy, flexible,                         transoral; diagnostic, including collection of                         specimen(s) by brushing or washing, when performed                         (separate procedure) Diagnosis Code(s):     --- Professional ---                        K29.70, Gastritis, unspecified, without bleeding                        K74.60, Unspecified cirrhosis of liver CPT copyright 2019 American Medical Association. All rights reserved. The codes documented in this report are preliminary and upon coder review may  be revised to meet current compliance requirements. [name], MD [name] MD, MD 02/12/2022 8:58:43 AM This report has been signed electronically. Number of Addenda: 0 Note Initiated On: 02/12/2022 8:44 AM Estimated Blood Loss:  Estimated blood loss: none.      [facility]

## 2022-02-12 NOTE — Anesthesia Procedure Notes (Signed)
Date/Time: 02/12/2022 9:00 AM  Performed by: Lily Peer, [name] [name], CRNAPre-anesthesia Checklist: Patient identified, Emergency Drugs available, Suction available, Patient being monitored and Timeout performed Patient Re-evaluated:Patient Re-evaluated prior to induction Oxygen Delivery Method: Simple face mask Induction Type: IV induction
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
## 2022-02-13 ENCOUNTER — Encounter: Payer: Self-pay | Admitting: Gastroenterology

## 2022-02-13 NOTE — Telephone Encounter (Signed)
Patient reports the following readings- 143/71, 80 177/116, 73 164/85, 81 156/86, 62 134/78, 77 Most readings were taking in the morning.  Patient gets up and takes AM medications and then checks his blood pressure

## 2022-02-15 NOTE — Anesthesia Postprocedure Evaluation (Signed)
Anesthesia Post Note  Patient: James Maxwell  Procedure(s) Performed: ESOPHAGOGASTRODUODENOSCOPY (EGD) WITH PROPOFOL  Patient location during evaluation: Endoscopy Anesthesia Type: General Level of consciousness: awake and alert Pain management: pain level controlled Vital Signs Assessment: post-procedure vital signs reviewed and stable Respiratory status: spontaneous breathing, nonlabored ventilation, respiratory function stable and patient connected to nasal cannula oxygen Cardiovascular status: blood pressure returned to baseline and stable Postop Assessment: no apparent nausea or vomiting Anesthetic complications: no   No notable events documented.   Last Vitals:  Vitals:   02/12/22 0911 02/12/22 0921  BP: 137/84 (!) 142/86  Pulse: 91 85  Resp: 17 18  Temp:    SpO2: 97% 98%    Last Pain:  Vitals:   02/13/22 0800  TempSrc:   PainSc: 0-No pain                 Martha Clan

## 2022-02-15 NOTE — Telephone Encounter (Signed)
Can add back Coreg 12.5 mg BID  He was on 25 mg BID in the past. Continue to monitor BP. Hopefully this will help with tremor as well.  If he starts having low BP, let us know, would decrease to 6.25 mg BID.

## 2022-02-16 MED ORDER — CARVEDILOL 12.5 MG PO TABS: 12.5000 mg | ORAL_TABLET | Freq: Two times a day (BID) | ORAL | 3 refills | Status: DC

## 2022-02-16 NOTE — Telephone Encounter (Signed)
Patient notified.  Prescription sent to [facility] in [place]

## 2022-02-20 ENCOUNTER — Telehealth: Payer: Self-pay | Admitting: Interventional Cardiology

## 2022-02-20 NOTE — Telephone Encounter (Signed)
Patient wants to know if he can get the RSV vaccine.

## 2022-02-21 NOTE — Telephone Encounter (Signed)
Yes, pt is over 60 with CHF and DM.  Recommend RSV vaccine

## 2022-02-21 NOTE — Telephone Encounter (Signed)
Left message to call office

## 2022-02-26 NOTE — Telephone Encounter (Signed)
I spoke with patient and gave him information from pharmacist.

## 2022-04-06 ENCOUNTER — Ambulatory Visit
Admission: RE | Admit: 2022-04-06 | Discharge: 2022-04-06 | Disposition: A | Discharge status: Home / Self Care | Payer: Medicare Other | Source: Ambulatory Visit | Attending: Gastroenterology | Admitting: Gastroenterology

## 2022-04-06 DIAGNOSIS — K7460 Unspecified cirrhosis of liver: Secondary | ICD-10-CM | POA: Diagnosis present

## 2022-04-06 DIAGNOSIS — K862 Cyst of pancreas: Secondary | ICD-10-CM | POA: Insufficient documentation

## 2022-04-06 MED ORDER — GADOBUTROL 1 MMOL/ML IV SOLN
10.0000 mL | Freq: Once | INTRAVENOUS | Status: AC | PRN
  Administered 2022-04-06: 10 mL via INTRAVENOUS

## 2022-04-09 ENCOUNTER — Encounter: Payer: Self-pay | Admitting: Gastroenterology

## 2022-04-09 NOTE — Progress Notes (Signed)
Maritza inform   MRI shows slight increase in size , please refer to Dr Rush Landmark or Dr Ardis Hughs for EUS please.  I will Cc to Dr Rush Landmark as well  Bailey Mech

## 2022-04-10 ENCOUNTER — Other Ambulatory Visit: Payer: Self-pay

## 2022-04-10 DIAGNOSIS — K862 Cyst of pancreas: Secondary | ICD-10-CM

## 2022-04-11 ENCOUNTER — Telehealth: Payer: Self-pay

## 2022-04-11 NOTE — Telephone Encounter (Signed)
[facility] Pre-operative Risk Assessment     Request for surgical clearance:     Endoscopy Procedure  What type of surgery is being performed?     EUS  When is this surgery scheduled?     05/28/22  What type of clearance is required ?   Pharmacy  Are there any medications that need to be held prior to surgery and how long? Plavix  Practice name and name of physician performing surgery?      [facility]  What is your office phone and fax number?      Phone- [phone]  [phone]  Anesthesia type (None, local, MAC, general) ?       MAC

## 2022-04-16 NOTE — Telephone Encounter (Signed)
   Name: James Maxwell  DOB: November 05, 1950  MRN: [id number]   Primary Cardiologist: Larae Grooms, MD  Chart reviewed as part of pre-operative protocol coverage. We have been asked for guidance to hold plavix for upcoming EUS. As previously recommended by Dr. Irish Lack in Sept 2023, may hold plavix for 5 days.   I will route this recommendation to the requesting party via Epic fax function and remove from pre-op pool. Please call with questions.  Tami Lin Kahlin Mark, PA 04/16/2022, 2:20 PM

## 2022-04-25 NOTE — Telephone Encounter (Signed)
Left message on machine to call back  

## 2022-04-26 NOTE — Telephone Encounter (Signed)
The pt returned call and has been advised of the hold for plavix x 5 days prior to the procedure.

## 2022-05-22 ENCOUNTER — Other Ambulatory Visit: Payer: Self-pay

## 2022-05-22 ENCOUNTER — Encounter (HOSPITAL_COMMUNITY): Payer: Self-pay | Admitting: Gastroenterology

## 2022-05-24 ENCOUNTER — Other Ambulatory Visit: Payer: Self-pay | Admitting: Interventional Cardiology

## 2022-05-28 ENCOUNTER — Ambulatory Visit (HOSPITAL_BASED_OUTPATIENT_CLINIC_OR_DEPARTMENT_OTHER): Payer: Medicare Other | Admitting: Anesthesiology

## 2022-05-28 ENCOUNTER — Ambulatory Visit (HOSPITAL_COMMUNITY): Payer: Medicare Other | Admitting: Anesthesiology

## 2022-05-28 ENCOUNTER — Encounter (HOSPITAL_COMMUNITY): Payer: Self-pay | Admitting: Gastroenterology

## 2022-05-28 ENCOUNTER — Encounter (HOSPITAL_COMMUNITY)
Admission: RE | Disposition: A | Discharge status: Home / Self Care | Payer: Self-pay | Source: Home / Self Care | Attending: Gastroenterology

## 2022-05-28 ENCOUNTER — Telehealth: Payer: Self-pay | Admitting: Gastroenterology

## 2022-05-28 ENCOUNTER — Ambulatory Visit (HOSPITAL_COMMUNITY)
Admission: RE | Admit: 2022-05-28 | Discharge: 2022-05-28 | Disposition: A | Discharge status: Home / Self Care | Payer: Medicare Other | Attending: Gastroenterology | Admitting: Gastroenterology

## 2022-05-28 DIAGNOSIS — K862 Cyst of pancreas: Secondary | ICD-10-CM | POA: Insufficient documentation

## 2022-05-28 DIAGNOSIS — I8500 Esophageal varices without bleeding: Secondary | ICD-10-CM | POA: Insufficient documentation

## 2022-05-28 DIAGNOSIS — K449 Diaphragmatic hernia without obstruction or gangrene: Secondary | ICD-10-CM

## 2022-05-28 DIAGNOSIS — K219 Gastro-esophageal reflux disease without esophagitis: Secondary | ICD-10-CM | POA: Insufficient documentation

## 2022-05-28 DIAGNOSIS — I509 Heart failure, unspecified: Secondary | ICD-10-CM

## 2022-05-28 DIAGNOSIS — I25119 Atherosclerotic heart disease of native coronary artery with unspecified angina pectoris: Secondary | ICD-10-CM | POA: Insufficient documentation

## 2022-05-28 DIAGNOSIS — Z8673 Personal history of transient ischemic attack (TIA), and cerebral infarction without residual deficits: Secondary | ICD-10-CM | POA: Diagnosis not present

## 2022-05-28 DIAGNOSIS — K259 Gastric ulcer, unspecified as acute or chronic, without hemorrhage or perforation: Secondary | ICD-10-CM | POA: Insufficient documentation

## 2022-05-28 DIAGNOSIS — K2970 Gastritis, unspecified, without bleeding: Secondary | ICD-10-CM

## 2022-05-28 DIAGNOSIS — I110 Hypertensive heart disease with heart failure: Secondary | ICD-10-CM | POA: Diagnosis not present

## 2022-05-28 DIAGNOSIS — Z955 Presence of coronary angioplasty implant and graft: Secondary | ICD-10-CM | POA: Insufficient documentation

## 2022-05-28 DIAGNOSIS — E119 Type 2 diabetes mellitus without complications: Secondary | ICD-10-CM | POA: Insufficient documentation

## 2022-05-28 DIAGNOSIS — I5042 Chronic combined systolic (congestive) and diastolic (congestive) heart failure: Secondary | ICD-10-CM | POA: Insufficient documentation

## 2022-05-28 DIAGNOSIS — K319 Disease of stomach and duodenum, unspecified: Secondary | ICD-10-CM | POA: Insufficient documentation

## 2022-05-28 DIAGNOSIS — I252 Old myocardial infarction: Secondary | ICD-10-CM | POA: Diagnosis not present

## 2022-05-28 DIAGNOSIS — Z87891 Personal history of nicotine dependence: Secondary | ICD-10-CM

## 2022-05-28 DIAGNOSIS — I899 Noninfective disorder of lymphatic vessels and lymph nodes, unspecified: Secondary | ICD-10-CM

## 2022-05-28 DIAGNOSIS — I471 Supraventricular tachycardia, unspecified: Secondary | ICD-10-CM | POA: Insufficient documentation

## 2022-05-28 HISTORY — DX: Personal history of urinary calculi: Z87.442

## 2022-05-28 HISTORY — DX: Pneumonia, unspecified organism: J18.9

## 2022-05-28 HISTORY — PX: EUS: SHX5427

## 2022-05-28 HISTORY — PX: FINE NEEDLE ASPIRATION: SHX5430

## 2022-05-28 HISTORY — PX: BIOPSY: SHX5522

## 2022-05-28 HISTORY — PX: ESOPHAGOGASTRODUODENOSCOPY (EGD) WITH PROPOFOL: SHX5813

## 2022-05-28 LAB — GLUCOSE, CAPILLARY: Glucose-Capillary: 114 mg/dL — ABNORMAL HIGH (ref 70–99)

## 2022-05-28 SURGERY — ESOPHAGOGASTRODUODENOSCOPY (EGD) WITH PROPOFOL
Anesthesia: Monitor Anesthesia Care

## 2022-05-28 MED ORDER — CIPROFLOXACIN IN D5W 400 MG/200ML IV SOLN
INTRAVENOUS | Status: AC
  Filled 2022-05-28: qty 200

## 2022-05-28 MED ORDER — PROPOFOL 500 MG/50ML IV EMUL
INTRAVENOUS | Status: DC | PRN
  Administered 2022-05-28: 130 ug/kg/min via INTRAVENOUS

## 2022-05-28 MED ORDER — CLOPIDOGREL BISULFATE 75 MG PO TABS: 75.0000 mg | ORAL_TABLET | Freq: Every day | ORAL | 3 refills | Status: DC

## 2022-05-28 MED ORDER — PROPOFOL 10 MG/ML IV BOLUS
INTRAVENOUS | Status: DC | PRN
  Administered 2022-05-28 (×2): 20 mg via INTRAVENOUS

## 2022-05-28 MED ORDER — PANTOPRAZOLE SODIUM 40 MG PO TBEC: 40.0000 mg | DELAYED_RELEASE_TABLET | Freq: Two times a day (BID) | ORAL | 3 refills | Status: DC

## 2022-05-28 MED ORDER — CIPROFLOXACIN HCL 500 MG PO TABS: 500.0000 mg | ORAL_TABLET | Freq: Two times a day (BID) | ORAL | 0 refills | Status: AC

## 2022-05-28 MED ORDER — LACTATED RINGERS IV SOLN: INTRAVENOUS | Status: DC

## 2022-05-28 MED ORDER — CIPROFLOXACIN IN D5W 400 MG/200ML IV SOLN
INTRAVENOUS | Status: DC | PRN
  Administered 2022-05-28: 400 mg via INTRAVENOUS

## 2022-05-28 MED ORDER — SODIUM CHLORIDE 0.9 % IV SOLN: INTRAVENOUS | Status: DC

## 2022-05-28 MED ORDER — PROPOFOL 1000 MG/100ML IV EMUL
INTRAVENOUS | Status: AC
  Filled 2022-05-28: qty 100

## 2022-05-28 NOTE — Anesthesia Postprocedure Evaluation (Signed)
Anesthesia Post Note  Patient: James Maxwell  Procedure(s) Performed: UPPER ENDOSCOPIC ULTRASOUND (EUS) LINEAR ESOPHAGOGASTRODUODENOSCOPY (EGD) WITH PROPOFOL BIOPSY FINE NEEDLE ASPIRATION (FNA) LINEAR     Patient location during evaluation: Endoscopy Anesthesia Type: MAC Level of consciousness: awake and alert Pain management: pain level controlled Vital Signs Assessment: post-procedure vital signs reviewed and stable Respiratory status: spontaneous breathing, nonlabored ventilation, respiratory function stable and patient connected to nasal cannula oxygen Cardiovascular status: blood pressure returned to baseline and stable Postop Assessment: no apparent nausea or vomiting Anesthetic complications: no  No notable events documented.  Last Vitals:  Vitals:   05/28/22 1417 05/28/22 1427  BP: (!) 172/96 (!) 152/56  Pulse: 82 70  Resp: (!) 27 (!) 23  Temp:    SpO2: 97% 96%    Last Pain:  Vitals:   05/28/22 1427  TempSrc:   PainSc: 0-No pain                 Demaya Hardge DANIEL

## 2022-05-28 NOTE — Op Note (Signed)
[facility] Patient Name: James Maxwell Procedure Date: 05/28/2022 MRN: [id number] Attending MD: Justice Britain , MD, [id number] Date of Birth: [birth year] CSN: [id number] Age: 72 Admit Type: Outpatient Procedure:                Upper EUS Indications:              Pancreatic cyst on MRCP Providers:                Justice Britain, MD, Carlyn Reichert, RN, [facility] Technician, Technician, [place]                            CRNA, CRNA Referring MD:              Medicines:                Monitored Anesthesia Care Complications:            No immediate complications. Estimated Blood Loss:     Estimated blood loss was minimal. Procedure:                Pre-Anesthesia Assessment:                           - Prior to the procedure, a History and Physical                            was performed, and patient medications and                            allergies were reviewed. The patient's tolerance of                            previous anesthesia was also reviewed. The risks                            and benefits of the procedure and the sedation                            options and risks were discussed with the patient.                            All questions were answered, and informed consent                            was obtained. Prior Anticoagulants: The patient has                            taken Plavix (clopidogrel), last dose was 5 days                            prior to procedure. ASA Grade Assessment: III - A  patient with severe systemic disease. After                            reviewing the risks and benefits, the patient was                            deemed in satisfactory condition to undergo the                            procedure.                           After obtaining informed consent, the endoscope was                            passed under direct vision. Throughout the                             procedure, the patient's blood pressure, pulse, and                            oxygen saturations were monitored continuously. The                            [id number]-H190 ([id number]) Olympus endoscope was introduced                            through the mouth, and advanced to the second part                            of duodenum. The [id number]-Q190V ([id number]) Olympus                            duodenoscope was introduced through the mouth, and                            advanced to the area of papilla. The GF-[id number]                            ([id number]) Olympus linear ultrasound scope was                            introduced through the mouth, and advanced to the                            duodenum for ultrasound examination from the                            stomach and duodenum. The upper EUS was                            accomplished without difficulty. The patient  tolerated the procedure. Scope In: Scope Out: Findings:      ENDOSCOPIC FINDING: :      No gross lesions were noted in the entire esophagus.      The Z-line was regular and was found 39 cm from the incisors.      A 2 cm hiatal hernia was present.      Segmental moderate inflammation characterized by erosions and erythema       was found in the entire examined stomach. Biopsies were taken with a       cold forceps for histology and Helicobacter pylori testing.      No gross lesions were noted in the duodenal bulb, in the first portion       of the duodenum and in the second portion of the duodenum.      The major papilla was normal.      ENDOSONOGRAPHIC FINDING: :      A multicystic and septated and partially solid lesion was identified in       the pancreatic body/tail region. It is not in obvious communication with       the pancreatic duct. The lesion measured 19 mm by 20 mm in maximal       cross-sectional diameter. The largest cystic component measured 7 mm x 7       mm. The cysts  were multiple and thinly septated. The outer wall of the       lesion was thin. There was no internal debris within the fluid-filled       cavity. Initially diagnostic needle aspiration for fluid was performed.       Color Doppler imaging was utilized prior to needle puncture to confirm a       lack of significant vascular structures within the needle path. One pass       was made with the [facility] 22 gauge needle using a transgastric       approach. A stylet was used. The amount of fluid collected was 0.8 mL.       The fluid was blood-tinged and watery. Sample was sent for glucose,       amylase concentration and CEA. Then we proceeded with fine needle       aspiration for cytology. Color Doppler imaging was utilized prior to       needle puncture to confirm a lack of significant vascular structures       within the needle path. Five passes were made with the 22 gauge needle       using a transgastric approach. A stylet was used. A cytotechnologist was       present to evaluate the adequacy of the specimen. Final cytology results       are pending.      There was no other sign of significant endosonographic abnormality in       the pancreatic head (PD = 2.2 mm), genu of the pancreas (PD = 1.3 mm),       pancreatic body (PD = 1.0 mm) and pancreatic tail. (PD = 0.5 mm)      There was no sign of significant endosonographic abnormality in the       common bile duct (3.5 mm) and in the common hepatic duct (5.7 mm). No       stones and ducts of normal caliber were identified.      Endosonographic imaging of the ampulla showed no intramural       (subepithelial)  lesion.      Endosonographic imaging in the visualized portion of the liver showed no       mass.      No malignant-appearing lymph nodes were visualized in the celiac region       (level 20), peripancreatic region and porta hepatis region.      The celiac region was visualized. Impression:               EGD impression:                            - No gross lesions in the entire esophagus. Z-line                            regular, 39 cm from the incisors.                           - 2 cm hiatal hernia.                           - Gastritis. Biopsied.                           - No gross lesions in the duodenal bulb, in the                            first portion of the duodenum and in the second                            portion of the duodenum.                           - Normal major papilla.                           EUS impression:                           - A solid?"cystic lesion was seen in the pancreatic                            body/tail region. Cytology results are pending.                            However, the endosonographic appearance is                            suggestive of a serous cystadenoma. Fine needle                            aspiration for fluid performed of the largest                            cystic cavity. Fine needle aspiration performed for                            tissue  purposes.                           - There was no other sign of significant pathology                            in the pancreatic head, genu of the pancreas,                            pancreatic body and pancreatic tail.                           - There was no sign of significant pathology in the                            common bile duct and in the common hepatic duct.                           - No malignant-appearing lymph nodes were                            visualized in the celiac region (level 20),                            peripancreatic region and porta hepatis region. Moderate Sedation:      Not Applicable - Patient had care per Anesthesia. Recommendation:           - The patient will be observed post-procedure,                            until all discharge criteria are met.                           - Discharge patient to home.                           - Patient has a contact number  available for                            emergencies. The signs and symptoms of potential                            delayed complications were discussed with the                            patient. Return to normal activities tomorrow.                            Written discharge instructions were provided to the                            patient.                           - Low fat diet.                           -  Observe patient's clinical course.                           - Await cytology results and await path results.                           - Ciprofloxacin 500 mg twice daily for 3 days to                            decrease postinfectious complications from today's                            procedure.                           - Increase PPI to 40 mg twice daily for at least                            the next 1 to 2 months with the amount of gastritis                            and gastric erosions that was present.                           - May restart Plavix on 1/10 PM or if patient takes                            it in the morning, then restart Plavix on 1/11 AM.                           - Pending cytology and fluid analysis, we will                            determine what the follow-up will be in regards to                            repeat imaging and potential discussion at [facility].                            Likely repeat MRI within the next 6 to 12 months.                           - The findings and recommendations were discussed                            with the patient.                           - The findings and recommendations were discussed                            with the designated responsible adult. Procedure Code(s):        --- Professional ---  43238, Esophagogastroduodenoscopy, flexible,                            transoral; with transendoscopic ultrasound-guided                            intramural or transmural fine  needle                            aspiration/biopsy(s), (includes endoscopic                            ultrasound examination limited to the esophagus,                            stomach or duodenum, and adjacent structures)                           43239, 59, Esophagogastroduodenoscopy, flexible,                            transoral; with biopsy, single or multiple Diagnosis Code(s):        --- Professional ---                           K44.9, Diaphragmatic hernia without obstruction or                            gangrene                           K29.70, Gastritis, unspecified, without bleeding                           K86.2, Cyst of pancreas                           I89.9, Noninfective disorder of lymphatic vessels                            and lymph nodes, unspecified CPT copyright 2022 American Medical Association. All rights reserved. The codes documented in this report are preliminary and upon coder review may  be revised to meet current compliance requirements. Justice Britain, MD 05/28/2022 2:29:09 PM Number of Addenda: 0
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
## 2022-05-28 NOTE — Telephone Encounter (Signed)
Taken care of. I called and spoke with wife. Thanks. GM
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
## 2022-05-28 NOTE — Telephone Encounter (Signed)
Patients wife called to follow up on the medications the patient was to receive today after his procedure. She stated they checked with [facility] and they do not an any orders.

## 2022-05-28 NOTE — Transfer of Care (Signed)
Immediate Anesthesia Transfer of Care Note  Patient: James Maxwell  Procedure(s) Performed: UPPER ENDOSCOPIC ULTRASOUND (EUS) LINEAR ESOPHAGOGASTRODUODENOSCOPY (EGD) WITH PROPOFOL BIOPSY FINE NEEDLE ASPIRATION (FNA) LINEAR  Patient Location: PACU  Anesthesia Type:MAC  Level of Consciousness: sedated  Airway & Oxygen Therapy: Patient Spontanous Breathing and Patient connected to face mask oxygen  Post-op Assessment: Report given to RN and Post -op Vital signs reviewed and stable  Post vital signs: Reviewed and stable  Last Vitals:  Vitals Value Taken Time  BP 159/78 05/28/22 1407  Temp 36.5 C 05/28/22 1407  Pulse 77 05/28/22 1408  Resp 16 05/28/22 1408  SpO2 100 % 05/28/22 1408  Vitals shown include unvalidated device data.  Last Pain:  Vitals:   05/28/22 1407  TempSrc: Temporal  PainSc:          Complications: No notable events documented.

## 2022-05-28 NOTE — Telephone Encounter (Signed)
Dr Rush Landmark did you mean to send any prescriptions for the pt?

## 2022-05-28 NOTE — Discharge Instructions (Signed)
YOU HAD AN ENDOSCOPIC PROCEDURE TODAY: Refer to the procedure report and other information in the discharge instructions given to you for any specific questions about what was found during the examination. If this information does not answer your questions, please call [place] office at [phone] to clarify.   YOU SHOULD EXPECT: Some feelings of bloating in the abdomen. Passage of more gas than usual. Walking can help get rid of the air that was put into your GI tract during the procedure and reduce the bloating. If you had a lower endoscopy (such as a colonoscopy or flexible sigmoidoscopy) you may notice spotting of blood in your stool or on the toilet paper. Some abdominal soreness may be present for a day or two, also.  DIET: Your first meal following the procedure should be a light meal and then it is ok to progress to your normal diet. A half-sandwich or bowl of soup is an example of a good first meal. Heavy or fried foods are harder to digest and may make you feel nauseous or bloated. Drink plenty of fluids but you should avoid alcoholic beverages for 24 hours. If you had a esophageal dilation, please see attached instructions for diet.    ACTIVITY: Your care partner should take you home directly after the procedure. You should plan to take it easy, moving slowly for the rest of the day. You can resume normal activity the day after the procedure however YOU SHOULD NOT DRIVE, use power tools, machinery or perform tasks that involve climbing or major physical exertion for 24 hours (because of the sedation medicines used during the test).   SYMPTOMS TO REPORT IMMEDIATELY: A gastroenterologist can be reached at any hour. Please call [phone]  for any of the following symptoms:  Following lower endoscopy (colonoscopy, flexible sigmoidoscopy) Excessive amounts of blood in the stool  Significant tenderness, worsening of abdominal pains  Swelling of the abdomen that is new, acute  Fever of 100 or  higher  Following upper endoscopy (EGD, EUS, ERCP, esophageal dilation) Vomiting of blood or coffee ground material  New, significant abdominal pain  New, significant chest pain or pain under the shoulder blades  Painful or persistently difficult swallowing  New shortness of breath  Black, tarry-looking or red, bloody stools  FOLLOW UP:  If any biopsies were taken you will be contacted by phone or by letter within the next 1-3 weeks. Call [phone]  if you have not heard about the biopsies in 3 weeks.  Please also call with any specific questions about appointments or follow up tests.YOU HAD AN ENDOSCOPIC PROCEDURE TODAY: Refer to the procedure report and other information in the discharge instructions given to you for any specific questions about what was found during the examination. If this information does not answer your questions, please call [place] office at [phone] to clarify.   YOU SHOULD EXPECT: Some feelings of bloating in the abdomen. Passage of more gas than usual. Walking can help get rid of the air that was put into your GI tract during the procedure and reduce the bloating. If you had a lower endoscopy (such as a colonoscopy or flexible sigmoidoscopy) you may notice spotting of blood in your stool or on the toilet paper. Some abdominal soreness may be present for a day or two, also.  DIET: Your first meal following the procedure should be a light meal and then it is ok to progress to your normal diet. A half-sandwich or bowl of soup is an example of a   good first meal. Heavy or fried foods are harder to digest and may make you feel nauseous or bloated. Drink plenty of fluids but you should avoid alcoholic beverages for 24 hours. If you had a esophageal dilation, please see attached instructions for diet.    ACTIVITY: Your care partner should take you home directly after the procedure. You should plan to take it easy, moving slowly for the rest of the day. You can resume  normal activity the day after the procedure however YOU SHOULD NOT DRIVE, use power tools, machinery or perform tasks that involve climbing or major physical exertion for 24 hours (because of the sedation medicines used during the test).   SYMPTOMS TO REPORT IMMEDIATELY: A gastroenterologist can be reached at any hour. Please call [phone]  for any of the following symptoms:   Following upper endoscopy (EGD, EUS) Vomiting of blood or coffee ground material  New, significant abdominal pain  New, significant chest pain or pain under the shoulder blades  Painful or persistently difficult swallowing  New shortness of breath  Black, tarry-looking or red, bloody stools  FOLLOW UP:  If any biopsies were taken you will be contacted by phone or by letter within the next 1-3 weeks. Call [phone]  if you have not heard about the biopsies in 3 weeks.  Please also call with any specific questions about appointments or follow up tests.

## 2022-05-29 LAB — SURGICAL PATHOLOGY

## 2022-05-30 ENCOUNTER — Encounter: Payer: Self-pay | Admitting: Gastroenterology

## 2022-05-30 LAB — CYTOLOGY - NON PAP

## 2022-05-31 ENCOUNTER — Encounter (HOSPITAL_COMMUNITY): Payer: Self-pay | Admitting: Gastroenterology

## 2022-06-01 NOTE — Progress Notes (Unsigned)
Assessment/Plan:   1.  Essential Tremor  -Continue primidone, 50 mg twice per day             -Discussed that his albuterol likely is contributing to tremor, although it is likely not the only source.  He is currently using this on a twice daily basis.             -Patient is already on beta-blockers (Coreg and metoprolol), but they are likely not helping tremor at the dosages he is on.  However, we would not be able to add further beta-blocker             -No evidence of a neurodegenerative process.  Will continue to monitor as he still does have some rest tremor in the legs but not bothersome to him  2.  Follow up 1 year  Subjective:   James Maxwell was seen today in follow up for essential tremor.  My previous records were reviewed prior to todays visit.  Patient was started on primidone last visit and reports that it is helping.  pt denies falls.  Pt denies lightheadedness, near syncope.  No hallucinations.  Mood has been good.  Patient has been seen by GI since our last visit and just had EUS for pancreatic cyst.  No malignant cells were identified within the pathology.  Current prescribed movement disorder medications: Primidone, 50 mg twice per day   PREVIOUS MEDICATIONS: none to date  ALLERGIES:   Allergies  Allergen Reactions   Lipitor [Atorvastatin]     myalgias    CURRENT MEDICATIONS:  Current Meds  Medication Sig   amLODipine (NORVASC) 5 MG tablet Take 1 tablet (5 mg total) by mouth daily. Please make yearly appt with Dr. Irish Lack for February 2023 for future refills. Thank you 1st attempt   aspirin EC 81 MG tablet Take 81 mg by mouth daily.   B-D ULTRAFINE III SHORT PEN 31G X 8 MM MISC SMARTSIG:injection Daily   BREZTRI AEROSPHERE 160-9-4.8 MCG/ACT AERO Inhale 2 puffs into the lungs 2 (two) times daily.   carvedilol (COREG) 12.5 MG tablet Take 1 tablet (12.5 mg total) by mouth 2 (two) times daily.   Cholecalciferol (VITAMIN D3) 5000 units CAPS Take 5,000 Units  by mouth daily.   clopidogrel (PLAVIX) 75 MG tablet Take 1 tablet (75 mg total) by mouth daily.   Continuous Blood Gluc Sensor (FREESTYLE LIBRE 2 SENSOR) MISC    fluticasone (FLONASE) 50 MCG/ACT nasal spray Place 1 spray into both nostrils daily as needed for allergies.    furosemide (LASIX) 40 MG tablet Take 1 tablet (40 mg total) by mouth daily.   insulin glargine (LANTUS) 100 UNIT/ML Solostar Pen Inject 76 Units into the skin 2 (two) times daily.   Insulin Lispro (HUMALOG KWIKPEN [place]) Inject 3-15 Units into the skin 3 (three) times daily as needed (Low blood glucose). Sliding scale Check 3-4 times a day 150-201= 3 202-250=6 units 251-300=9 units 301-350= 12 units 351-400=15 units   montelukast (SINGULAIR) 10 MG tablet Take 10 mg by mouth every morning.   nitroGLYCERIN (NITROSTAT) 0.4 MG SL tablet Place 1 tablet (0.4 mg total) under the tongue every 5 (five) minutes as needed for chest pain.   pantoprazole (PROTONIX) 40 MG tablet Take 1 tablet (40 mg total) by mouth 2 (two) times daily before a meal.   primidone (MYSOLINE) 50 MG tablet Take 1 tablet (50 mg total) by mouth 2 (two) times daily.   rosuvastatin (CRESTOR) 20 MG  tablet Take 1 tablet (20 mg total) by mouth daily.   tiZANidine (ZANAFLEX) 2 MG tablet Take 2 mg by mouth daily as needed for muscle spasms.   vitamin E 180 MG (400 UNITS) capsule Take 400 Units by mouth daily.   Zinc 50 MG TABS Take 50 mg by mouth daily.      Objective:    PHYSICAL EXAMINATION:    VITALS:   Vitals:   06/04/22 0816  BP: 124/76  Pulse: 66  SpO2: 96%  Weight: 262 lb 6.4 oz (119 kg)  Height: '5\' 9"'$  (1.753 m)    GEN:  The patient appears stated age and is in NAD. HEENT:  Normocephalic, atraumatic.  The mucous membranes are moist. The superficial temporal arteries are without ropiness or tenderness. CV:  RRR Lungs:  CTAB Neck/HEME:  There are no carotid bruits bilaterally.  Neurological examination:  Orientation: The patient is alert and  oriented x3. Cranial nerves: There is good facial symmetry. The speech is fluent and clear. Soft palate rises symmetrically and there is no tongue deviation. Hearing is intact to conversational tone. Sensation: Sensation is intact to light touch throughout Motor: Strength is at least antigravity x4.  Movement examination: one: There is normal tone in the bilateral upper extremities.  The tone in the lower extremities is normal.  Abnormal movements: rest tremor of the R leg (prior bilateral).  No rest tremor of UE.  There is postural tremor on L.  archimedes spirals are improved.   Coordination:  There is no decremation with RAM's, with any form of RAMS, including alternating supination and pronation of the forearm, hand opening and closing, finger taps, heel taps and toe taps. Gait and Station: The patient pushes off of the chair to arise.  He is just slightly wide-based.  There is no shuffling.  I have reviewed and interpreted the following labs independently   Chemistry      Component Value Date/Time   NA 138 09/28/2021 0534   NA 142 04/13/2020 0714   K 3.9 09/28/2021 0534   CL 106 09/28/2021 0534   CO2 24 09/28/2021 0534   BUN 15 09/28/2021 0534   BUN 15 04/13/2020 0714   CREATININE 1.16 09/28/2021 0534   CREATININE 1.31 02/03/2013 1822      Component Value Date/Time   CALCIUM 9.2 09/28/2021 0534   ALKPHOS 59 12/06/2021 0933   AST 49 (H) 12/06/2021 0933   ALT 19 12/06/2021 0933   BILITOT 0.8 12/06/2021 0933      Lab Results  Component Value Date   WBC 11.8 (H) 09/28/2021   HGB 14.0 09/28/2021   HCT 42.1 09/28/2021   MCV 86.4 09/28/2021   PLT 219 09/28/2021   Lab Results  Component Value Date   TSH 1.280 04/05/2020     Chemistry      Component Value Date/Time   NA 138 09/28/2021 0534   NA 142 04/13/2020 0714   K 3.9 09/28/2021 0534   CL 106 09/28/2021 0534   CO2 24 09/28/2021 0534   BUN 15 09/28/2021 0534   BUN 15 04/13/2020 0714   CREATININE 1.16 09/28/2021  0534   CREATININE 1.31 02/03/2013 1822      Component Value Date/Time   CALCIUM 9.2 09/28/2021 0534   ALKPHOS 59 12/06/2021 0933   AST 49 (H) 12/06/2021 0933   ALT 19 12/06/2021 0933   BILITOT 0.8 12/06/2021 0933         Total time spent on today's visit was 20 minutes,  including both face-to-face time and nonface-to-face time.  Time included that spent on review of records (prior notes available to me/labs/imaging if pertinent), discussing treatment and goals, answering patient's questions and coordinating care.  Cc:  Jodi Marble, MD

## 2022-06-04 ENCOUNTER — Encounter: Payer: Self-pay | Admitting: Neurology

## 2022-06-04 ENCOUNTER — Ambulatory Visit: Payer: Medicare Other | Admitting: Neurology

## 2022-06-04 VITALS — BP 124/76 | HR 66 | Ht 69.0 in | Wt 262.4 lb

## 2022-06-04 DIAGNOSIS — G250 Essential tremor: Secondary | ICD-10-CM

## 2022-06-04 NOTE — Patient Instructions (Signed)
It was good to see you!  Call me if you need me before next visit!  The physicians and staff at [facility] are committed to providing excellent care. You may receive a survey requesting feedback about your experience at our office. We strive to receive "very good" responses to the survey questions. If you feel that your experience would prevent you from giving the office a "very good " response, please contact our office to try to remedy the situation. We may be reached at [phone]. Thank you for taking the time out of your busy day to complete the survey.

## 2022-06-09 ENCOUNTER — Encounter: Payer: Self-pay | Admitting: Gastroenterology

## 2022-06-09 NOTE — Progress Notes (Signed)
[facility] pancreatic fluid analysis  CEA 1.8 ng/mL Amylase 44 units/L Glucose 93 mg/dL  Overall based on the imaging findings as well as the cytology findings being nondysplastic, and with this fluid analysis showing a low CEA and low amylase, this is most likely a serous cystadenoma.  As it had not been monitored for quite a while, I recommend a repeat imaging study with primary gastroenterologist in 1 year.  If all is stable, then likely can stop monitoring.  I will forward this information to the patient's primary gastroenterologist.  We will reach out to the patient and let him know these recommendations.  We will also get these results mailed to him for his records.   Justice Britain, MD [facility] Office # [phone]

## 2022-06-10 NOTE — Progress Notes (Signed)
Thank you ,   Herb Grays can we put in a reminder to repeat MRI pancreas in 1 year time to follow up

## 2022-06-11 NOTE — Progress Notes (Signed)
Left message on machine to call back  

## 2022-06-12 NOTE — Progress Notes (Signed)
Left message on machine to call back  

## 2022-06-12 NOTE — Progress Notes (Signed)
The pt has been advised and will be mailed a copy. He will follow up with his primary GI as requested.

## 2022-06-13 ENCOUNTER — Encounter: Payer: Self-pay | Admitting: Gastroenterology

## 2022-06-21 ENCOUNTER — Ambulatory Visit: Payer: Medicare HMO

## 2022-06-21 DIAGNOSIS — E119 Type 2 diabetes mellitus without complications: Secondary | ICD-10-CM

## 2022-06-21 NOTE — Progress Notes (Signed)
Patient was seen in office for [facility] change out

## 2022-06-25 ENCOUNTER — Other Ambulatory Visit: Payer: Self-pay

## 2022-06-25 MED ORDER — LOSARTAN POTASSIUM 100 MG PO TABS: 100.0000 mg | ORAL_TABLET | Freq: Every day | ORAL | 1 refills | Status: DC

## 2022-07-06 ENCOUNTER — Ambulatory Visit (INDEPENDENT_AMBULATORY_CARE_PROVIDER_SITE_OTHER): Payer: Medicare HMO | Admitting: Family

## 2022-07-06 DIAGNOSIS — E119 Type 2 diabetes mellitus without complications: Secondary | ICD-10-CM

## 2022-07-06 NOTE — Progress Notes (Unsigned)
Subjective:  Objective:  o

## 2022-07-12 ENCOUNTER — Other Ambulatory Visit: Payer: Self-pay | Admitting: Internal Medicine

## 2022-07-12 DIAGNOSIS — J301 Allergic rhinitis due to pollen: Secondary | ICD-10-CM

## 2022-07-15 ENCOUNTER — Other Ambulatory Visit: Payer: Self-pay | Admitting: Internal Medicine

## 2022-07-15 DIAGNOSIS — J301 Allergic rhinitis due to pollen: Secondary | ICD-10-CM

## 2022-07-15 DIAGNOSIS — E119 Type 2 diabetes mellitus without complications: Secondary | ICD-10-CM

## 2022-07-20 ENCOUNTER — Ambulatory Visit: Payer: Medicare HMO

## 2022-07-22 ENCOUNTER — Other Ambulatory Visit: Payer: Self-pay | Admitting: Interventional Cardiology

## 2022-08-03 ENCOUNTER — Ambulatory Visit: Payer: Medicare HMO

## 2022-08-04 ENCOUNTER — Other Ambulatory Visit: Payer: Self-pay | Admitting: Neurology

## 2022-08-10 ENCOUNTER — Encounter: Payer: Self-pay | Admitting: Family

## 2022-08-10 ENCOUNTER — Ambulatory Visit (INDEPENDENT_AMBULATORY_CARE_PROVIDER_SITE_OTHER): Payer: Medicare Other | Admitting: Family

## 2022-08-10 ENCOUNTER — Other Ambulatory Visit: Payer: Self-pay | Admitting: Internal Medicine

## 2022-08-10 VITALS — BP 130/82 | HR 71 | Ht 69.0 in | Wt 261.6 lb

## 2022-08-10 DIAGNOSIS — R200 Anesthesia of skin: Secondary | ICD-10-CM | POA: Diagnosis not present

## 2022-08-10 DIAGNOSIS — R002 Palpitations: Secondary | ICD-10-CM

## 2022-08-10 DIAGNOSIS — I252 Old myocardial infarction: Secondary | ICD-10-CM

## 2022-08-10 DIAGNOSIS — Z8673 Personal history of transient ischemic attack (TIA), and cerebral infarction without residual deficits: Secondary | ICD-10-CM
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
## 2022-08-11 LAB — TROPONIN T: Troponin T (Highly Sensitive): 14 ng/L (ref 0–22)

## 2022-08-11 LAB — LIPID PANEL W/O CHOL/HDL RATIO
Cholesterol, Total: 106 mg/dL (ref 100–199)
HDL: 57 mg/dL (ref >39)
LDL Chol Calc (NIH): 32 mg/dL (ref 0–99)
Triglycerides: 85 mg/dL (ref 0–149)
VLDL Cholesterol Cal: 17 mg/dL (ref 5–40)

## 2022-08-11 LAB — HGB A1C W/O EAG: Hgb A1c MFr Bld: 6.8 % — ABNORMAL HIGH (ref 4.8–5.6)

## 2022-08-14 ENCOUNTER — Other Ambulatory Visit: Payer: Medicare HMO

## 2022-08-16 ENCOUNTER — Other Ambulatory Visit: Payer: Medicare HMO

## 2022-08-30 ENCOUNTER — Ambulatory Visit: Payer: Medicare HMO

## 2022-09-06 ENCOUNTER — Other Ambulatory Visit: Payer: Self-pay | Admitting: Gastroenterology

## 2022-09-06 ENCOUNTER — Other Ambulatory Visit: Payer: Self-pay | Admitting: Internal Medicine

## 2022-09-06 ENCOUNTER — Other Ambulatory Visit: Payer: Self-pay | Admitting: Interventional Cardiology

## 2022-09-06 DIAGNOSIS — E119 Type 2 diabetes mellitus without complications: Secondary | ICD-10-CM

## 2022-09-06 MED ORDER — POTASSIUM CHLORIDE CRYS ER 20 MEQ PO TBCR: 20.0000 meq | EXTENDED_RELEASE_TABLET | Freq: Every day | ORAL | 0 refills | Status: DC

## 2022-09-13 ENCOUNTER — Ambulatory Visit: Payer: Medicare HMO

## 2022-09-14 ENCOUNTER — Encounter: Payer: Self-pay | Admitting: Internal Medicine

## 2022-09-14 ENCOUNTER — Ambulatory Visit (INDEPENDENT_AMBULATORY_CARE_PROVIDER_SITE_OTHER): Payer: Medicare HMO | Admitting: Internal Medicine

## 2022-09-14 VITALS — BP 112/70 | HR 70 | Temp 98.2°F | Ht 69.0 in | Wt 259.6 lb

## 2022-09-14 DIAGNOSIS — I10 Essential (primary) hypertension: Secondary | ICD-10-CM | POA: Diagnosis not present

## 2022-09-14 DIAGNOSIS — E1159 Type 2 diabetes mellitus with other circulatory complications: Secondary | ICD-10-CM

## 2022-09-14 DIAGNOSIS — E119 Type 2 diabetes mellitus without complications: Secondary | ICD-10-CM

## 2022-09-14 LAB — POCT CBG (FASTING - GLUCOSE)-MANUAL ENTRY: Glucose Fasting, POC: 215 mg/dL — AB (ref 70–99)

## 2022-09-14 MED ORDER — ACARBOSE 50 MG PO TABS: ORAL_TABLET | ORAL | 1 refills | Status: DC
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
## 2022-10-03 ENCOUNTER — Other Ambulatory Visit: Payer: Self-pay | Admitting: Internal Medicine

## 2022-10-23 ENCOUNTER — Telehealth: Payer: Self-pay

## 2022-10-23 NOTE — Telephone Encounter (Signed)
Patient papers are ready for pick up, need to know if he wants [place] to fax them as well

## 2022-10-25 DIAGNOSIS — I739 Peripheral vascular disease, unspecified: Secondary | ICD-10-CM | POA: Diagnosis not present

## 2022-10-25 DIAGNOSIS — L84 Corns and callosities: Secondary | ICD-10-CM | POA: Diagnosis not present

## 2022-10-25 DIAGNOSIS — B351 Tinea unguium: Secondary | ICD-10-CM | POA: Diagnosis not present

## 2022-10-25 DIAGNOSIS — E1142 Type 2 diabetes mellitus with diabetic polyneuropathy: Secondary | ICD-10-CM | POA: Diagnosis not present

## 2022-10-25 DIAGNOSIS — L603 Nail dystrophy: Secondary | ICD-10-CM | POA: Diagnosis not present

## 2022-11-04 ENCOUNTER — Other Ambulatory Visit: Payer: Self-pay | Admitting: Neurology

## 2022-11-27 DIAGNOSIS — H40013 Open angle with borderline findings, low risk, bilateral: Secondary | ICD-10-CM | POA: Diagnosis not present

## 2022-12-05 ENCOUNTER — Other Ambulatory Visit: Payer: Self-pay | Admitting: Interventional Cardiology

## 2022-12-14 ENCOUNTER — Ambulatory Visit: Payer: Medicare HMO | Admitting: Internal Medicine

## 2022-12-19 ENCOUNTER — Other Ambulatory Visit: Payer: Medicare HMO

## 2022-12-19 ENCOUNTER — Other Ambulatory Visit: Payer: Self-pay

## 2022-12-19 DIAGNOSIS — E1159 Type 2 diabetes mellitus with other circulatory complications: Secondary | ICD-10-CM | POA: Diagnosis not present

## 2022-12-19 DIAGNOSIS — I10 Essential (primary) hypertension: Secondary | ICD-10-CM

## 2022-12-21 ENCOUNTER — Ambulatory Visit (INDEPENDENT_AMBULATORY_CARE_PROVIDER_SITE_OTHER): Payer: Medicare HMO | Admitting: Internal Medicine

## 2022-12-21 VITALS — BP 120/69 | HR 73 | Ht 69.0 in | Wt 261.4 lb

## 2022-12-21 DIAGNOSIS — I10 Essential (primary) hypertension: Secondary | ICD-10-CM | POA: Diagnosis not present

## 2022-12-21 DIAGNOSIS — R200 Anesthesia of skin: Secondary | ICD-10-CM

## 2022-12-21 DIAGNOSIS — E1159 Type 2 diabetes mellitus with other circulatory complications: Secondary | ICD-10-CM

## 2022-12-21 LAB — POC CREATINE & ALBUMIN,URINE
Creatinine, POC: 300 mg/dL
Microalbumin Ur, POC: 80 mg/L

## 2022-12-21 LAB — POCT CBG (FASTING - GLUCOSE)-MANUAL ENTRY: Glucose Fasting, POC: 97 mg/dL (ref 70–99)

## 2022-12-21 MED ORDER — EMPAGLIFLOZIN 25 MG PO TABS
25.0000 mg | ORAL_TABLET | Freq: Every day | ORAL | 0 refills | Status: DC
Start: 2022-12-21 — End: 2023-02-08
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
## 2023-01-14 ENCOUNTER — Other Ambulatory Visit: Payer: Self-pay | Admitting: Internal Medicine

## 2023-01-14 DIAGNOSIS — J301 Allergic rhinitis due to pollen: Secondary | ICD-10-CM

## 2023-01-19 ENCOUNTER — Other Ambulatory Visit: Payer: Self-pay | Admitting: Interventional Cardiology

## 2023-01-24 DIAGNOSIS — B351 Tinea unguium: Secondary | ICD-10-CM | POA: Diagnosis not present

## 2023-01-24 DIAGNOSIS — L84 Corns and callosities: Secondary | ICD-10-CM | POA: Diagnosis not present

## 2023-01-24 DIAGNOSIS — E1142 Type 2 diabetes mellitus with diabetic polyneuropathy: Secondary | ICD-10-CM | POA: Diagnosis not present

## 2023-01-24 DIAGNOSIS — I739 Peripheral vascular disease, unspecified: Secondary | ICD-10-CM | POA: Diagnosis not present

## 2023-01-24 DIAGNOSIS — L603 Nail dystrophy: Secondary | ICD-10-CM | POA: Diagnosis not present

## 2023-01-24 NOTE — Progress Notes (Signed)
Cardiology Office Note:   Date:  01/30/2023  ID:  James Maxwell, DOB 1950/08/26, MRN [id number] PCP:  James Monday, MD  [facility] Providers Cardiologist:  James Skeans, MD Referring MD: James Monday, MD   Chief Complaint/Reason for Referral: Cardiology follow-up ASSESSMENT:    1. Coronary artery disease of native artery of native heart with stable angina pectoris (HCC)   2. Type 2 diabetes mellitus with complication, with long-term current use of insulin (HCC)   3. Hypertension associated with diabetes (HCC)   4. Hyperlipidemia associated with type 2 diabetes mellitus (HCC)   5. Aortic atherosclerosis (HCC)   6. CKD (chronic kidney disease) stage 2, GFR 60-89 ml/min   7. OSA (obstructive sleep apnea)   8. Diastolic dysfunction with chronic heart failure (HCC)   9. BMI 39.0-39.9,adult     PLAN:   In order of problems listed above: 1.  Coronary artery disease: Stop aspirin and continue Plavix monotherapy indefinitely.  Continue Crestor.  Refill NTG today (no recent use) 2.  Type 2 diabetes: Continue Plavix in lieu of aspirin, Jardiance, losartan, and Crestor.  Will refer to pharmacy regarding GLP-1 receptor agonist therapy given elevated BMI. 3.  Hypertension: Blood pressure is well-controlled on his current regimen. 4.  Hyperlipidemia: Will check lipid panel, LFTs, LP(a) next week. 5.  Aortic atherosclerosis: Continue antiplatelet therapy and statin. 6.  CKD: Continue losartan and Jardiance for renal protection. 7.  Obstructive sleep apnea: Continue CPAP. 8.  Diastolic dysfunction: Continue Coreg, Jardiance, losartan, and start spironolactone 25mg ; check BMP next week. 9.  Elevated BMI: Refer to pharmacy for recommendations regarding GLP-1 receptor agonist therapy for weight loss and to decrease future risk of myocardial infarction.      Dispo:  Return in about 6 months (around 07/30/2023).    Medication Adjustments/Labs and Tests Ordered: Current medicines are  reviewed at length with the patient today.  Concerns regarding medicines are outlined above.  The following changes have been made:     Labs/tests ordered: Orders Placed This Encounter  Procedures   Lipoprotein A (LPA)   Lipid panel   Hepatic function panel   Basic Metabolic Panel (BMET)   AMB Referral to [facility] Pharm-D    Medication Changes: Meds ordered this encounter  Medications   nitroGLYCERIN (NITROSTAT) 0.4 MG SL tablet    Sig: Place 1 tablet (0.4 mg total) under the tongue every 5 (five) minutes as needed for chest pain.    Dispense:  25 tablet    Refill:  3   spironolactone (ALDACTONE) 25 MG tablet    Sig: Take 1 tablet (25 mg total) by mouth daily.    Dispense:  90 tablet    Refill:  3    Current medicines are reviewed at length with the patient today.  The patient does not have concerns regarding medicines.  History of Present Illness:   FOCUSED PROBLEM LIST:   Coronary artery disease status post POBA of OM in 1998, bare-metal stenting of mid right coronary artery in 2005, and drug eluting stent placement for ISR in 2018 Type 2 diabetes on insulin Hypertension Hyperlipidemia; intolerant of Lipitor Aortic atherosclerosis on chest CT 2022 CKD stage II BMI 39 Obstructive sleep apnea on CPAP History of AVNRT status post ablation 2020 Diastolic dysfunction  September 2024: The patient is a 72 y.o. male with the indicated medical history here for routine cardiology follow-up.  He was last seen in clinic in July of last year and was doing relatively well.  The patient continues to do well.  He is retired and has been for about 14 years.  He does not do much in a day but he is able to do most of his activities of daily living without any issues.  He has had no problems with his medications.  He denies any shortness of breath, exertional angina, presyncope, or syncope.  He does get some occasional dependent lower extremity edema that resolves with his daily Lasix.  He  has had no severe bleeding or bruising episodes and denies any signs or symptoms of stroke.  He is otherwise well without complaints.         Current Medications: Current Meds  Medication Sig   acarbose (PRECOSE) 50 MG tablet TAKE 1 TABLET BY MOUTH 3 TIMES DAILY AT START OF MEAL   ACCU-CHEK AVIVA PLUS test strip    ACCU-CHEK SOFTCLIX LANCETS lancets    Alcohol Swabs (B-D SINGLE USE SWABS REGULAR) PADS    amLODipine (NORVASC) 5 MG tablet Take 1 tablet (5 mg total) by mouth daily. Please make yearly appt with Dr. Eldridge Dace for February 2023 for future refills. Thank you 1st attempt   B-D ULTRAFINE III SHORT PEN 31G X 8 MM MISC SMARTSIG:injection Daily   BREZTRI AEROSPHERE 160-9-4.8 MCG/ACT AERO Inhale 2 puffs into the lungs 2 (two) times daily.   carvedilol (COREG) 12.5 MG tablet Take 1 tablet (12.5 mg total) by mouth 2 (two) times daily.   Cholecalciferol (VITAMIN D3) 5000 units CAPS Take 5,000 Units by mouth daily.   clopidogrel (PLAVIX) 75 MG tablet TAKE 1 TABLET BY MOUTH EVERY DAY   Continuous Blood Gluc Sensor (FREESTYLE LIBRE 2 SENSOR) MISC    empagliflozin (JARDIANCE) 25 MG TABS tablet Take 1 tablet (25 mg total) by mouth daily before breakfast.   fluticasone (FLONASE) 50 MCG/ACT nasal spray Place 1 spray into both nostrils daily as needed for allergies.    furosemide (LASIX) 40 MG tablet TAKE 1 TABLET BY MOUTH EVERY DAY   insulin glargine (LANTUS) 100 UNIT/ML Solostar Pen Inject 82 Units into the skin as directed.  In the am and 70 units qpm   Insulin Lispro (HUMALOG KWIKPEN [place]) Inject 3-15 Units into the skin 3 (three) times daily as needed (Low blood glucose). Sliding scale Check 3-4 times a day 150-201= 3 202-250=6 units 251-300=9 units 301-350= 12 units 351-400=15 units   losartan (COZAAR) 100 MG tablet TAKE 1 TABLET BY MOUTH EVERY DAY   metFORMIN (GLUCOPHAGE) 1000 MG tablet TAKE 1 TABLET BY MOUTH TWICE A DAY   montelukast (SINGULAIR) 10 MG tablet TAKE 1 TABLET BY MOUTH EVERY  DAY IN THE MORNING   pantoprazole (PROTONIX) 40 MG tablet TAKE 1 TABLET (40 MG TOTAL) BY MOUTH TWICE A DAY BEFORE MEALS   potassium chloride SA (KLOR-CON M20) 20 MEQ tablet Take 1 tablet (20 mEq total) by mouth daily.   primidone (MYSOLINE) 50 MG tablet TAKE 1 TABLET BY MOUTH TWICE A DAY   rosuvastatin (CRESTOR) 20 MG tablet Take 1 tablet (20 mg total) by mouth daily.   spironolactone (ALDACTONE) 25 MG tablet Take 1 tablet (25 mg total) by mouth daily.   tiZANidine (ZANAFLEX) 2 MG tablet Take 2 mg by mouth daily as needed for muscle spasms.   vitamin E 180 MG (400 UNITS) capsule Take 400 Units by mouth daily.   Zinc 50 MG TABS Take 50 mg by mouth daily.   [DISCONTINUED] aspirin EC 81 MG tablet Take 81 mg by mouth daily.   [  DISCONTINUED] nitroGLYCERIN (NITROSTAT) 0.4 MG SL tablet Place 1 tablet (0.4 mg total) under the tongue every 5 (five) minutes as needed for chest pain.     Allergies:    Lipitor [atorvastatin]   Social History:   Social History   Tobacco Use   Smoking status: Former    Current packs/day: 0.00    Types: Cigarettes    Quit date: 07/16/1995    Years since quitting: 27.5   Smokeless tobacco: Never  Vaping Use   Vaping status: Never Used  Substance Use Topics   Alcohol use: No   Drug use: No     Family Hx: Family History  Problem Relation Age of Onset   Diabetes Mother    Hypertension Mother    Heart attack Mother    Hypertension Sister    Cancer Brother    Cancer Brother    Heart attack Brother    Heart attack Son      Review of Systems:   Please see the history of present illness.    All other systems reviewed and are negative.     EKGs/Labs/Other Test Reviewed:   EKG:    EKG Interpretation Date/Time:    Ventricular Rate:    PR Interval:    QRS Duration:    QT Interval:    QTC Calculation:   R Axis:      Text Interpretation:          Prior CV studies reviewed: Cardiac Studies & Procedures   CARDIAC CATHETERIZATION  CARDIAC  CATHETERIZATION 01/27/2019  Narrative  Prox LAD lesion is 50% stenosed.  Prox Cx to Dist Cx lesion is 25% stenosed.  Previously placed Prox RCA to Mid RCA stent (unknown type) is widely patent.  Mid RCA lesion is 50% stenosed.  LV end diastolic pressure is mildly elevated.  1. Moderate nonobstructive CAD. Lesions in the proximal LAD and mid RCA are unchanged from 2018. Stents in the RCA are still patent 2. Elevated LVEDP 23 mm Hg  Plan: recommend medical management. Will increase lasix to 40 mg daily. Patient is a candidate for DC today.  Findings Coronary Findings Diagnostic  Dominance: Right  Left Main Vessel was injected. Vessel is normal in caliber. Vessel is angiographically normal.  Left Anterior Descending Prox LAD lesion is 50% stenosed.  Left Circumflex Prox Cx to Dist Cx lesion is 25% stenosed.  Right Coronary Artery Previously placed Prox RCA to Mid RCA stent (unknown type) is widely patent. Mid RCA lesion is 50% stenosed. The lesion is eccentric.  Intervention  No interventions have been documented.   CARDIAC CATHETERIZATION  CARDIAC CATHETERIZATION 01/25/2017  Narrative  A STENT SIERRA 3.50 X 28 MM drug eluting stent was successfully placed, and overlaps previously placed stent.  Mid RCA lesion, 75 %stenosed.  Post intervention, there is a 0% residual stenosis.  Conclusion Successful PCI and stent of in-stent restenosis 75% mid RCA Lesion reduced from 75 down to 0% with DES 3.528 mm xience Postdilated with 4.520 mm [place] to 14 atm  Findings Coronary Findings Diagnostic  Dominance: Right  Right Coronary Artery Culprit lesion. The lesion is type B1, located at the major branch, eccentric and irregular. The lesion was previously treated using a drug eluting stent over 2 years ago. Previously placed stent displays no rethrombosis. Previously placed stent displays restenosis. The stenosis was measured by a visual reading. Pressure wire/FFR was not  performed on the lesion. IVUS was not performed on the lesion.  Intervention  Mid RCA lesion  Angioplasty Lesion length: 23 mm. Lesion crossed with guidewire using a WIRE G HI TQ BMW 190. Pre-stent angioplasty was performed using a BALLOON TREK RX 3.0X20. Maximum pressure: 12 atm. Inflation time: 30 sec. A STENT SIERRA 3.50 X 28 MM drug eluting stent was successfully placed, and overlaps previously placed stent. Minimum lumen area: 16 mm. Stent strut is well apposed. Post-stent angioplasty was performed using a BALLOON [place] TREK RX 4.5X20. Maximum pressure: 14 atm. Inflation time: 20 sec. The pre-interventional distal flow is normal (TIMI 3).  The post-interventional distal flow is normal (TIMI 3). The intervention was successful . No complications occurred at this lesion. Pressure wire/FFR was not performed on the lesion . IVUS was not performed on the lesion . There is a 0% residual stenosis post intervention.   STRESS TESTS  MYOCARDIAL PERFUSION IMAGING 08/30/2021  Narrative   Findings are consistent with no ischemia. The study is low risk.   No ST deviation was noted. Rare PVCs throughout study.   LV perfusion is abnormal. Defect 1: There is a small defect with mild reduction in uptake present in the apical inferior and apex location(s) that is fixed. There is normal wall motion in the defect area. Most consistent with artifact caused by diaphragmatic attenuation.   Left ventricular function is abnormal. Global function is mildly reduced. Nuclear stress EF: 53 %. The left ventricular ejection fraction is mildly decreased (45-54%). End diastolic cavity size is normal.   Prior study available for comparison from 01/23/2021. Perfusion defects in the distal anteroseptum and inferolateral walls are no longer visualized.   ECHOCARDIOGRAM  ECHOCARDIOGRAM COMPLETE 08/30/2021  Narrative ECHOCARDIOGRAM REPORT    Patient Name:   James Maxwell Date of Exam: 08/30/2021 Medical Rec #:  [id number]       Height:       69.0 in Accession #:    [id number]     Weight:       269.0 lb Date of Birth:  1950-07-19       BSA:          2.343 m Patient Age:    72 years       BP:           138/86 mmHg Patient Gender: M              HR:           87 bpm. Exam Location:  [address]  Procedure: 2D Echo, Cardiac Doppler, Color Doppler and Intracardiac Opacification Agent  Indications:    R07.9* Chest pain, unspecified; R06.00 Dyspnea  History:        Patient has prior history of Echocardiogram examinations, most recent 04/05/2020. CHF, CAD and Previous Myocardial Infarction, COPD and Stroke, Signs/Symptoms:Chest Pain; Risk Factors:Hypertension, Dyslipidemia and Diabetes. Sleep apnea. Acute coronary syndrome.  Sonographer:    Cathie Beams RCS Referring Phys: [phone] CAITLIN S WALKER  IMPRESSIONS   1. Left ventricular ejection fraction, by estimation, is 60 to 65%. The left ventricle has normal function. The left ventricle has no regional wall motion abnormalities. There is mild concentric left ventricular hypertrophy. Left ventricular diastolic parameters are consistent with Grade I diastolic dysfunction (impaired relaxation). 2. Right ventricular systolic function is normal. The right ventricular size is normal. 3. The mitral valve is normal in structure. No evidence of mitral valve regurgitation. No evidence of mitral stenosis. 4. The aortic valve is normal in structure. There is mild calcification of the aortic valve. There is mild thickening of the aortic valve.  Aortic valve regurgitation is not visualized. Aortic valve sclerosis is present, with no evidence of aortic valve stenosis. 5. The inferior vena cava is normal in size with greater than 50% respiratory variability, suggesting right atrial pressure of 3 mmHg.  Comparison(s): Prior images reviewed side by side. The left ventricular function has improved. The left ventricular wall motion abnormality is improved.  FINDINGS Left  Ventricle: Left ventricular ejection fraction, by estimation, is 60 to 65%. The left ventricle has normal function. The left ventricle has no regional wall motion abnormalities. The left ventricular internal cavity size was normal in size. There is mild concentric left ventricular hypertrophy. Left ventricular diastolic parameters are consistent with Grade I diastolic dysfunction (impaired relaxation). Indeterminate filling pressures.  Right Ventricle: The right ventricular size is normal. No increase in right ventricular wall thickness. Right ventricular systolic function is normal.  Left Atrium: Left atrial size was normal in size.  Right Atrium: Right atrial size was normal in size.  Pericardium: There is no evidence of pericardial effusion.  Mitral Valve: The mitral valve is normal in structure. No evidence of mitral valve regurgitation. No evidence of mitral valve stenosis.  Tricuspid Valve: The tricuspid valve is normal in structure. Tricuspid valve regurgitation is not demonstrated. No evidence of tricuspid stenosis.  Aortic Valve: The aortic valve is normal in structure. There is mild calcification of the aortic valve. There is mild thickening of the aortic valve. Aortic valve regurgitation is not visualized. Aortic valve sclerosis is present, with no evidence of aortic valve stenosis.  Pulmonic Valve: The pulmonic valve was normal in structure. Pulmonic valve regurgitation is not visualized. No evidence of pulmonic stenosis.  Aorta: The aortic root is normal in size and structure.  Venous: The inferior vena cava is normal in size with greater than 50% respiratory variability, suggesting right atrial pressure of 3 mmHg.  IAS/Shunts: No atrial level shunt detected by color flow Doppler.   LEFT VENTRICLE PLAX 2D LVIDd:         4.20 cm   Diastology LVIDs:         3.10 cm   LV e' medial:    5.98 cm/s LV PW:         1.30 cm   LV E/e' medial:  15.0 LV IVS:        1.60 cm   LV e'  lateral:   8.27 cm/s LVOT diam:     1.95 cm   LV E/e' lateral: 10.8 LV SV:         59 LV SV Index:   25 LVOT Area:     2.99 cm   RIGHT VENTRICLE RV Basal diam:  3.00 cm RV S prime:     13.60 cm/s TAPSE (M-mode): 2.2 cm  LEFT ATRIUM             Index        RIGHT ATRIUM           Index LA diam:        3.10 cm 1.32 cm/m   RA Area:     12.80 cm LA Vol (A2C):   27.1 ml 11.56 ml/m  RA Volume:   26.70 ml  11.39 ml/m LA Vol (A4C):   26.9 ml 11.48 ml/m LA Biplane Vol: 28.1 ml 11.99 ml/m AORTIC VALVE LVOT Vmax:   97.40 cm/s LVOT Vmean:  66.500 cm/s LVOT VTI:    0.196 m  AORTA Ao Root diam: 3.30 cm Ao Asc diam:  3.10 cm  MITRAL VALVE MV Area (PHT): 3.77 cm     SHUNTS MV Decel Time: 201 msec     Systemic VTI:  0.20 m MV E velocity: 89.60 cm/s   Systemic Diam: 1.95 cm MV A velocity: 128.00 cm/s MV E/A ratio:  0.70  Mihai Croitoru MD Electronically signed by Thurmon Fair MD Signature Date/Time: 08/30/2021/5:22:31 PM    Final    MONITORS  CARDIAC EVENT MONITOR 04/06/2019  Narrative  Normal sinus rhythm.  Short run of atrial tachycardia noted, but no symptoms.  No sustained pathologic arrhythmias.  Continue medical therapy.           Recent Labs: 12/19/2022: ALT 16; BUN 13; Creatinine, Ser 1.05; Potassium 4.6; Sodium 141   Lipid Panel    Component Value Date/Time   CHOL 100 12/19/2022 0840   TRIG 84 12/19/2022 0840   HDL 51 12/19/2022 0840   CHOLHDL 2.0 12/19/2022 0840   CHOLHDL 4.0 04/13/2016 0424   VLDL 21 04/13/2016 0424   LDLCALC 32 12/19/2022 0840    Risk Assessment/Calculations:          Physical Exam:   VS:  BP 120/80   Pulse 74   Ht 5\' 9"  (1.753 m)   Wt 259 lb (117.5 kg)   SpO2 97%   BMI 38.25 kg/m        Wt Readings from Last 3 Encounters:  01/30/23 259 lb (117.5 kg)  12/21/22 261 lb 6.4 oz (118.6 kg)  09/14/22 259 lb 9.6 oz (117.8 kg)      GENERAL:  No apparent distress, AOx3 HEENT:  No carotid bruits, +2 carotid impulses,  no scleral icterus CAR: RRR no murmurs, gallops, rubs, or thrills RES:  Clear to auscultation bilaterally ABD:  Soft, obese, nontender, nondistended, positive bowel sounds x 4 VASC:  +2 radial pulses, +2 carotid pulses NEURO:  CN 2-12 grossly intact; motor and sensory grossly intact PSYCH:  No active depression or anxiety EXT:  No edema, ecchymosis, or cyanosis  Signed, Orbie Pyo, MD  01/30/2023 10:06 AM    [facility] [address] [place], [place], [place]  [address] Phone: [phone]; Fax: [phone]   Note:  This document was prepared using Dragon voice recognition software and may include unintentional dictation errors.

## 2023-01-30 ENCOUNTER — Encounter: Payer: Self-pay | Admitting: Internal Medicine

## 2023-01-30 ENCOUNTER — Ambulatory Visit: Payer: Medicare HMO | Attending: Internal Medicine | Admitting: Internal Medicine

## 2023-01-30 VITALS — BP 120/80 | HR 74 | Ht 69.0 in | Wt 259.0 lb

## 2023-01-30 DIAGNOSIS — I700 Atherosclerosis of aorta: Secondary | ICD-10-CM | POA: Diagnosis not present

## 2023-01-30 DIAGNOSIS — E118 Type 2 diabetes mellitus with unspecified complications: Secondary | ICD-10-CM | POA: Diagnosis not present

## 2023-01-30 DIAGNOSIS — E1159 Type 2 diabetes mellitus with other circulatory complications: Secondary | ICD-10-CM

## 2023-01-30 DIAGNOSIS — G4733 Obstructive sleep apnea (adult) (pediatric): Secondary | ICD-10-CM

## 2023-01-30 DIAGNOSIS — I152 Hypertension secondary to endocrine disorders: Secondary | ICD-10-CM

## 2023-01-30 DIAGNOSIS — E1169 Type 2 diabetes mellitus with other specified complication: Secondary | ICD-10-CM

## 2023-01-30 DIAGNOSIS — I25118 Atherosclerotic heart disease of native coronary artery with other forms of angina pectoris: Secondary | ICD-10-CM

## 2023-01-30 DIAGNOSIS — Z794 Long term (current) use of insulin: Secondary | ICD-10-CM

## 2023-01-30 DIAGNOSIS — N182 Chronic kidney disease, stage 2 (mild): Secondary | ICD-10-CM | POA: Diagnosis not present

## 2023-01-30 DIAGNOSIS — Z6839 Body mass index (BMI) 39.0-39.9, adult: Secondary | ICD-10-CM | POA: Diagnosis not present

## 2023-01-30 DIAGNOSIS — I5032 Chronic diastolic (congestive) heart failure: Secondary | ICD-10-CM

## 2023-01-30 DIAGNOSIS — E785 Hyperlipidemia, unspecified: Secondary | ICD-10-CM

## 2023-01-30 MED ORDER — NITROGLYCERIN 0.4 MG SL SUBL: 0.4000 mg | SUBLINGUAL_TABLET | SUBLINGUAL | 3 refills | Status: DC | PRN

## 2023-01-30 MED ORDER — SPIRONOLACTONE 25 MG PO TABS: 25.0000 mg | ORAL_TABLET | Freq: Every day | ORAL | 3 refills | Status: DC

## 2023-01-30 NOTE — Patient Instructions (Signed)
Medication Instructions:  Your physician has recommended you make the following change in your medication:  1.) stop aspirin 2.) start spironolactone 25 mg - take one tablet daily  *If you need a refill on your cardiac medications before your next appointment, please call your pharmacy*   Lab Work: Please return in about a week for blood work (bmet, lipids, liver, Lpa) If you have labs (blood work) drawn today and your tests are completely normal, you will receive your results only by: MyChart Message (if you have MyChart) OR A paper copy in the mail If you have any lab test that is abnormal or we need to change your treatment, we will call you to review the results.   Testing/Procedures: none   Follow-Up: At [facility], you and your health needs are our priority.  As part of our continuing mission to provide you with exceptional heart care, we have created designated Provider Care Teams.  These Care Teams include your primary Cardiologist (physician) and Advanced Practice Providers (APPs -  Physician Assistants and Nurse Practitioners) who all work together to provide you with the care you need, when you need it.  We recommend signing up for the patient portal called "MyChart".  Sign up information is provided on this After Visit Summary.  MyChart is used to connect with patients for Virtual Visits (Telemedicine).  Patients are able to view lab/test results, encounter notes, upcoming appointments, etc.  Non-urgent messages can be sent to your provider as well.   To learn more about what you can do with MyChart, go to [email or web address].    Your next appointment:   6 month(s)  Provider:   Jari Favre, PA-C, Ronie Spies, PA-C, Robin Searing, NP, Jacolyn Reedy, PA-C, Eligha Bridegroom, NP, Tereso Newcomer, PA-C, or Perlie Gold, PA-C       Other Instructions You have been referred to clinical pharmacy team (PharmD) for elevated BMI

## 2023-01-31 ENCOUNTER — Other Ambulatory Visit: Payer: Medicare HMO

## 2023-01-31 DIAGNOSIS — E1159 Type 2 diabetes mellitus with other circulatory complications: Secondary | ICD-10-CM | POA: Diagnosis not present

## 2023-02-01 LAB — FRUCTOSAMINE: Fructosamine: 243 umol/L (ref 0–285)

## 2023-02-05 ENCOUNTER — Ambulatory Visit (INDEPENDENT_AMBULATORY_CARE_PROVIDER_SITE_OTHER): Payer: Medicare HMO | Admitting: Internal Medicine

## 2023-02-05 VITALS — BP 129/75 | HR 58 | Ht 69.0 in | Wt 259.4 lb

## 2023-02-05 DIAGNOSIS — I252 Old myocardial infarction: Secondary | ICD-10-CM

## 2023-02-05 DIAGNOSIS — E1159 Type 2 diabetes mellitus with other circulatory complications: Secondary | ICD-10-CM | POA: Diagnosis not present

## 2023-02-05 LAB — POCT CBG (FASTING - GLUCOSE)-MANUAL ENTRY: Glucose Fasting, POC: 119 mg/dL — AB (ref 70–99)
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
## 2023-02-06 ENCOUNTER — Encounter: Payer: Self-pay | Admitting: Student

## 2023-02-06 ENCOUNTER — Telehealth: Payer: Self-pay | Admitting: Pharmacy Technician

## 2023-02-06 ENCOUNTER — Other Ambulatory Visit: Payer: Self-pay | Admitting: Neurology

## 2023-02-06 ENCOUNTER — Ambulatory Visit (INDEPENDENT_AMBULATORY_CARE_PROVIDER_SITE_OTHER): Payer: Medicare HMO | Admitting: Student

## 2023-02-06 ENCOUNTER — Other Ambulatory Visit: Payer: Self-pay | Admitting: Interventional Cardiology

## 2023-02-06 ENCOUNTER — Other Ambulatory Visit (HOSPITAL_COMMUNITY): Payer: Self-pay

## 2023-02-06 ENCOUNTER — Ambulatory Visit: Payer: Medicare HMO | Attending: Internal Medicine

## 2023-02-06 ENCOUNTER — Telehealth: Payer: Self-pay | Admitting: Pharmacist

## 2023-02-06 DIAGNOSIS — E1169 Type 2 diabetes mellitus with other specified complication: Secondary | ICD-10-CM | POA: Diagnosis not present

## 2023-02-06 DIAGNOSIS — I5032 Chronic diastolic (congestive) heart failure: Secondary | ICD-10-CM

## 2023-02-06 DIAGNOSIS — N182 Chronic kidney disease, stage 2 (mild): Secondary | ICD-10-CM | POA: Diagnosis not present

## 2023-02-06 DIAGNOSIS — E119 Type 2 diabetes mellitus without complications: Secondary | ICD-10-CM

## 2023-02-06 DIAGNOSIS — I25118 Atherosclerotic heart disease of native coronary artery with other forms of angina pectoris: Secondary | ICD-10-CM | POA: Diagnosis not present

## 2023-02-06 DIAGNOSIS — E785 Hyperlipidemia, unspecified: Secondary | ICD-10-CM | POA: Diagnosis not present

## 2023-02-06 DIAGNOSIS — I700 Atherosclerosis of aorta: Secondary | ICD-10-CM | POA: Diagnosis not present

## 2023-02-06 NOTE — Telephone Encounter (Signed)
Pharmacy Patient Advocate Encounter  Insurance verification completed.    The patient is insured through [employer] claim for [employer]. Currently a quantity of 2 ML is a 28 day supply and the co-pay is $47.00 .   This test claim was processed through [facility]- copay amounts may vary at other pharmacies due to pharmacy/plan contracts, or as the patient moves through the different stages of their insurance plan.

## 2023-02-06 NOTE — Telephone Encounter (Signed)
Call to inform about coverage. N/A LVM.

## 2023-02-06 NOTE — Progress Notes (Signed)
Patient ID: CRUE TRAUGH                 DOB: Jan 20, 1951                    MRN: [id number]     HPI: James Maxwell is a 72 y.o. male patient referred to pharmacy clinic by [facility]  to initiate GLP1-RA therapy. PMH is significant for CAD, hx of MI, T2DM, CHF, unstable angina, HLD, and obesity. Most recent BMI 38.63 kg/m2.   Patient presented today for GLP1 consultation. He has been on insulin for long time. He gets hypoglycemia symptoms once or twice week- he gets arm tingling and shakiness when hi BG gets low. He is currently on Lantus 82 units am and 70 units at night Humalog as needed as per sliding scale. He eats only 2 meals per day. He watches carb intake and fat intake.  Baseline weight and BMI: 261.6 lbs and 38.63 kg/m2 Current meds that affect weight: insulin  Diet: he watches his carb intake, salt intake and fat intake - does not eat out only eat home cooked food   Exercise: knee pain restrict his exercise capacity, however can walk 5-10 min at a time encourage to walk 10 min twice a day daily with some chair exercise  Family History:  Relation Problem Comments  Mother (Deceased) Diabetes   Heart attack   Hypertension     Father (Deceased)   Sister (Deceased)   Sister Metallurgist) Hypertension     Brother (Deceased) Cancer     Brother (Deceased) Cancer     Brother (Deceased) Heart attack     Son (Deceased) Heart attack     Social History:  Alcohol: none  Smoking: none   Labs: Lab Results  Component Value Date   HGBA1C 7.1 (H) 12/19/2022    Wt Readings from Last 1 Encounters:  02/05/23 259 lb 6.4 oz (117.7 kg)    BP Readings from Last 1 Encounters:  02/05/23 129/75   Pulse Readings from Last 1 Encounters:  02/05/23 (!) 58       Component Value Date/Time   CHOL 100 12/19/2022 0840   TRIG 84 12/19/2022 0840   HDL 51 12/19/2022 0840   CHOLHDL 2.0 12/19/2022 0840   CHOLHDL 4.0 04/13/2016 0424   VLDL 21 04/13/2016 0424   LDLCALC 32 12/19/2022 0840     Past Medical History:  Diagnosis Date   Arthritis    Chronic combined systolic and diastolic CHF (congestive heart failure) (HCC)    pt. denies   COPD (chronic obstructive pulmonary disease) (HCC)    Coronary artery disease    2006 s/p PCI RCA, BMS of the mid RCA in 2005 with repeat stenting of this segment in 2018 with DES, moderate nonobstructive disease by cath 01/2019   Diabetes mellitus without complication (HCC)    GERD (gastroesophageal reflux disease)    History of gout    History of kidney stones    Hyperlipidemia    Hypertension    MI, old 1997   Pneumonia    Sleep apnea    cpap   Stroke (HCC)    no residual   SVT (supraventricular tachycardia)    ablation 04/2019    Current Outpatient Medications on File Prior to Visit  Medication Sig Dispense Refill   acarbose (PRECOSE) 50 MG tablet TAKE 1 TABLET BY MOUTH 3 TIMES DAILY AT START OF MEAL 270 tablet 1   ACCU-CHEK AVIVA PLUS test strip  ACCU-CHEK SOFTCLIX LANCETS lancets      Alcohol Swabs (B-D SINGLE USE SWABS REGULAR) PADS      amLODipine (NORVASC) 5 MG tablet Take 1 tablet (5 mg total) by mouth daily. Please make yearly appt with Dr. Eldridge Dace for February 2023 for future refills. Thank you 1st attempt 90 tablet 0   B-D ULTRAFINE III SHORT PEN 31G X 8 MM MISC SMARTSIG:injection Daily     BREZTRI AEROSPHERE 160-9-4.8 MCG/ACT AERO Inhale 2 puffs into the lungs 2 (two) times daily.     carvedilol (COREG) 12.5 MG tablet Take 1 tablet (12.5 mg total) by mouth 2 (two) times daily. 180 tablet 3   Cholecalciferol (VITAMIN D3) 5000 units CAPS Take 5,000 Units by mouth daily.     clopidogrel (PLAVIX) 75 MG tablet TAKE 1 TABLET BY MOUTH EVERY DAY 90 tablet 0   Continuous Blood Gluc Sensor (FREESTYLE LIBRE 2 SENSOR) MISC      empagliflozin (JARDIANCE) 25 MG TABS tablet Take 1 tablet (25 mg total) by mouth daily before breakfast. 90 tablet 0   fluticasone (FLONASE) 50 MCG/ACT nasal spray Place 1 spray into both nostrils  daily as needed for allergies.      furosemide (LASIX) 40 MG tablet TAKE 1 TABLET BY MOUTH EVERY DAY 90 tablet 0   insulin glargine (LANTUS) 100 UNIT/ML Solostar Pen Inject 82 Units into the skin as directed.  In the am and 70 units qpm     Insulin Lispro (HUMALOG KWIKPEN [place]) Inject 3-15 Units into the skin 3 (three) times daily as needed (Low blood glucose). Sliding scale Check 3-4 times a day 150-201= 3 202-250=6 units 251-300=9 units 301-350= 12 units 351-400=15 units     losartan (COZAAR) 100 MG tablet TAKE 1 TABLET BY MOUTH EVERY DAY 90 tablet 1   metFORMIN (GLUCOPHAGE) 1000 MG tablet TAKE 1 TABLET BY MOUTH TWICE A DAY 180 tablet 2   montelukast (SINGULAIR) 10 MG tablet TAKE 1 TABLET BY MOUTH EVERY DAY IN THE MORNING 90 tablet 1   nitroGLYCERIN (NITROSTAT) 0.4 MG SL tablet Place 1 tablet (0.4 mg total) under the tongue every 5 (five) minutes as needed for chest pain. 25 tablet 3   pantoprazole (PROTONIX) 40 MG tablet TAKE 1 TABLET (40 MG TOTAL) BY MOUTH TWICE A DAY BEFORE MEALS 180 tablet 1   potassium chloride SA (KLOR-CON M20) 20 MEQ tablet Take 1 tablet (20 mEq total) by mouth daily. 90 tablet 0   primidone (MYSOLINE) 50 MG tablet TAKE 1 TABLET BY MOUTH TWICE A DAY 180 tablet 0   rosuvastatin (CRESTOR) 20 MG tablet Take 1 tablet (20 mg total) by mouth daily. 90 tablet 3   spironolactone (ALDACTONE) 25 MG tablet Take 1 tablet (25 mg total) by mouth daily. 90 tablet 3   tiZANidine (ZANAFLEX) 2 MG tablet Take 2 mg by mouth daily as needed for muscle spasms.     vitamin E 180 MG (400 UNITS) capsule Take 400 Units by mouth daily.     Zinc 50 MG TABS Take 50 mg by mouth daily.     No current facility-administered medications on file prior to visit.    Allergies  Allergen Reactions   Lipitor [Atorvastatin]     myalgias     Assessment/Plan:  1. Weight loss - Patient has not met goal of at least 5% of body weight loss with comprehensive lifestyle modifications alone in the past 3-6  months. Pharmacotherapy is appropriate to pursue as augmentation. Will assess Mounjaro or Ozempic  coverage  Will discontinue acarbose and slowly reduce basal insulin dose as we titrate GLP 1 dose up. Confirmed patient has no personal or family history of medullary thyroid carcinoma (MTC) or Multiple Endocrine Neoplasia syndrome type 2 (MEN 2). Injection technique reviewed at today's visit.  Advised patient on common side effects including nausea, diarrhea, dyspepsia, decreased appetite, and fatigue. Counseled patient on reducing meal size and how to titrate medication to minimize side effects. Counseled patient to call if intolerable side effects or if experiencing dehydration, abdominal pain, or dizziness. Patient will adhere to dietary modifications and will target at least 150 minutes of moderate intensity exercise weekly.   Follow up in 1 month via telephone for tolerability update and dose titration.

## 2023-02-06 NOTE — Patient Instructions (Signed)
GLP1 Agonist Titration Plan:  Will plan to follow the titration plan as below, pending patient is tolerating each dose before increasing to the next. Can slow titration if needed for tolerability.  If you have constipation from GLP1 agent take Miralax   Mounjaro  -Month 1: Inject Mounjaro 2.5 mg SQ once weekly x 4 weeks -Month 2: Inject  Mounjaro 5 mg  SQ once weekly x 4 weeks -Month 3: Inject Mounjaro 7.5 mg SQ once weekly x 4 weeks -Month 4: Inject Mounjaro  10 mg SQ once weekly x 4 weeks -Month 5: Inject Mounjaro 12.5  mg SQ once weekly x 4 week  -Month 6+: Inject Mounjaro 15 mg SQ once weekly     Ozempic:  -Month 1: Inject Ozempic 0.25 mg SQ once weekly x 4 weeks -Month 2: Inject Ozempic 0.5 mg  SQ once weekly x 4 weeks -Month 3: Inject Ozempic 1 mg SQ once weekly x 4 weeks -Month 4+: Inject Ozempic 2 mg SQ once weekly

## 2023-02-06 NOTE — Telephone Encounter (Signed)
GLP1 Coverage assessment requested

## 2023-02-07 LAB — LIPID PANEL
Chol/HDL Ratio: 1.8 ratio (ref 0.0–5.0)
Cholesterol, Total: 86 mg/dL — ABNORMAL LOW (ref 100–199)
HDL: 47 mg/dL (ref >39)
LDL Chol Calc (NIH): 23 mg/dL (ref 0–99)
Triglycerides: 78 mg/dL (ref 0–149)
VLDL Cholesterol Cal: 16 mg/dL (ref 5–40)

## 2023-02-07 LAB — BASIC METABOLIC PANEL
BUN/Creatinine Ratio: 12 (ref 10–24)
BUN: 12 mg/dL (ref 8–27)
CO2: 24 mmol/L (ref 20–29)
Calcium: 9.1 mg/dL (ref 8.6–10.2)
Chloride: 106 mmol/L (ref 96–106)
Creatinine, Ser: 1 mg/dL (ref 0.76–1.27)
Glucose: 77 mg/dL (ref 70–99)
Potassium: 4.4 mmol/L (ref 3.5–5.2)
Sodium: 144 mmol/L (ref 134–144)
eGFR: 80 mL/min/{1.73_m2} (ref >59)

## 2023-02-07 LAB — LIPOPROTEIN A (LPA): Lipoprotein (a): 259.2 nmol/L — ABNORMAL HIGH (ref <75.0)

## 2023-02-07 LAB — HEPATIC FUNCTION PANEL
ALT: 15 IU/L (ref 0–44)
AST: 32 IU/L (ref 0–40)
Albumin: 4.2 g/dL (ref 3.8–4.8)
Alkaline Phosphatase: 71 IU/L (ref 44–121)
Bilirubin Total: 0.7 mg/dL (ref 0.0–1.2)
Bilirubin, Direct: 0.24 mg/dL (ref 0.00–0.40)
Total Protein: 7.3 g/dL (ref 6.0–8.5)

## 2023-02-08 ENCOUNTER — Other Ambulatory Visit: Payer: Self-pay | Admitting: Internal Medicine

## 2023-02-08 ENCOUNTER — Other Ambulatory Visit: Payer: Self-pay | Admitting: Interventional Cardiology

## 2023-02-08 ENCOUNTER — Other Ambulatory Visit (HOSPITAL_COMMUNITY): Payer: Self-pay

## 2023-02-08 DIAGNOSIS — E1159 Type 2 diabetes mellitus with other circulatory complications: Secondary | ICD-10-CM

## 2023-02-08 MED ORDER — MOUNJARO 2.5 MG/0.5ML ~~LOC~~ SOAJ: 2.5000 mg | SUBCUTANEOUS | 0 refills | Status: DC

## 2023-02-08 NOTE — Telephone Encounter (Signed)
Try to call multiple time. Will send MyChart

## 2023-02-08 NOTE — Addendum Note (Signed)
Addended by: Tylene Fantasia on: 02/08/2023 11:51 AM   Modules accepted: Orders

## 2023-02-12 ENCOUNTER — Other Ambulatory Visit: Payer: Self-pay | Admitting: Internal Medicine

## 2023-02-12 DIAGNOSIS — E119 Type 2 diabetes mellitus without complications: Secondary | ICD-10-CM

## 2023-02-12 NOTE — Telephone Encounter (Signed)
Patient called back. I relayed the information that was in the [facility] message to him. His pharmacy had to order med. He hasn't heard anything from them yet. Reminded him to stop acarbose when he starts Mounjaro. Reminded him its just once a week. Susa Day will follow up with him in 1 month. He is ok with cost.

## 2023-02-12 NOTE — Addendum Note (Signed)
Addended by: Malena Peer D on: 02/12/2023 02:40 PM   Modules accepted: Orders

## 2023-03-04 ENCOUNTER — Other Ambulatory Visit: Payer: Self-pay | Admitting: Internal Medicine

## 2023-03-07 ENCOUNTER — Telehealth: Payer: Self-pay | Admitting: Internal Medicine

## 2023-03-07 NOTE — Telephone Encounter (Signed)
Left message for pt, should be due for dose increase of Mounjaro if tolerating well.

## 2023-03-07 NOTE — Telephone Encounter (Signed)
*  STAT* If patient is at the pharmacy, call can be transferred to refill team.   1. Which medications need to be refilled? (please list name of each medication and dose if known)  tirzepatide (MOUNJARO) 2.5 MG/0.5ML Pen  2. Which pharmacy/location (including street and city if local pharmacy) is medication to be sent to? [id number] - [place], [place] - [address] [facility]    3. Do they need a 30 day or 90 day supply?  90 day supply

## 2023-03-08 ENCOUNTER — Telehealth: Payer: Self-pay | Admitting: Internal Medicine

## 2023-03-08 NOTE — Telephone Encounter (Signed)
Abby from [facility] left VM needing patient's last office note. Did not leave a fax number.   Callback # [phone]

## 2023-03-11 NOTE — Telephone Encounter (Signed)
Called pt, N/A LVM again and sent MyChart

## 2023-03-12 ENCOUNTER — Ambulatory Visit: Payer: Medicare Other | Admitting: Interventional Cardiology

## 2023-03-14 ENCOUNTER — Other Ambulatory Visit (HOSPITAL_COMMUNITY): Payer: Self-pay

## 2023-03-18 MED ORDER — MOUNJARO 5 MG/0.5ML ~~LOC~~ SOAJ: 5.0000 mg | SUBCUTANEOUS | 0 refills | Status: DC

## 2023-03-18 NOTE — Addendum Note (Signed)
Addended by: Tylene Fantasia on: 03/18/2023 04:30 PM   Modules accepted: Orders

## 2023-03-19 NOTE — Telephone Encounter (Signed)
Spoke to patient over the phone. Reports his FBG ~ 95-108 and no hypoglycemic episode since started [facility]. Advised patient to watch for hypoglycemia symptoms when he starts taking Mounjaro 5 mg dose. If needed reduce basal insulin dose by 2-3 units. If don't know how much to lower can call us and we will help adjusting basal insulin dose

## 2023-03-22 ENCOUNTER — Other Ambulatory Visit: Payer: Medicare HMO

## 2023-03-22 DIAGNOSIS — E1159 Type 2 diabetes mellitus with other circulatory complications: Secondary | ICD-10-CM | POA: Diagnosis not present

## 2023-03-22 DIAGNOSIS — I252 Old myocardial infarction: Secondary | ICD-10-CM | POA: Diagnosis not present

## 2023-03-23 LAB — HEPATIC FUNCTION PANEL
ALT: 13 [IU]/L (ref 0–44)
AST: 24 [IU]/L (ref 0–40)
Albumin: 3.9 g/dL (ref 3.8–4.8)
Alkaline Phosphatase: 67 [IU]/L (ref 44–121)
Bilirubin Total: 1 mg/dL (ref 0.0–1.2)
Bilirubin, Direct: 0.3 mg/dL (ref 0.00–0.40)
Total Protein: 6.8 g/dL (ref 6.0–8.5)

## 2023-03-23 LAB — LIPID PANEL
Chol/HDL Ratio: 1.9 {ratio} (ref 0.0–5.0)
Cholesterol, Total: 88 mg/dL — ABNORMAL LOW (ref 100–199)
HDL: 47 mg/dL (ref >39)
LDL Chol Calc (NIH): 24 mg/dL (ref 0–99)
Triglycerides: 85 mg/dL (ref 0–149)
VLDL Cholesterol Cal: 17 mg/dL (ref 5–40)

## 2023-03-23 LAB — HEMOGLOBIN A1C
Est. average glucose Bld gHb Est-mCnc: 137 mg/dL
Hgb A1c MFr Bld: 6.4 % — ABNORMAL HIGH (ref 4.8–5.6)

## 2023-03-26 ENCOUNTER — Encounter: Payer: Self-pay | Admitting: Internal Medicine

## 2023-03-26 ENCOUNTER — Ambulatory Visit (INDEPENDENT_AMBULATORY_CARE_PROVIDER_SITE_OTHER): Payer: Medicare HMO | Admitting: Internal Medicine

## 2023-03-26 VITALS — BP 138/75 | HR 56 | Ht 69.0 in | Wt 255.0 lb

## 2023-03-26 DIAGNOSIS — N400 Enlarged prostate without lower urinary tract symptoms: Secondary | ICD-10-CM | POA: Diagnosis not present

## 2023-03-26 DIAGNOSIS — E1159 Type 2 diabetes mellitus with other circulatory complications: Secondary | ICD-10-CM | POA: Diagnosis not present

## 2023-03-26 LAB — POCT CBG (FASTING - GLUCOSE)-MANUAL ENTRY: Glucose Fasting, POC: 105 mg/dL — AB (ref 70–99)

## 2023-03-26 MED ORDER — MOUNJARO 7.5 MG/0.5ML ~~LOC~~ SOAJ: 7.5000 mg | SUBCUTANEOUS | 2 refills | Status: DC
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
## 2023-04-01 ENCOUNTER — Telehealth: Payer: Self-pay

## 2023-04-05 ENCOUNTER — Ambulatory Visit (INDEPENDENT_AMBULATORY_CARE_PROVIDER_SITE_OTHER): Payer: Medicare HMO | Admitting: Internal Medicine

## 2023-04-05 DIAGNOSIS — R6889 Other general symptoms and signs: Secondary | ICD-10-CM | POA: Diagnosis not present

## 2023-04-05 DIAGNOSIS — J069 Acute upper respiratory infection, unspecified: Secondary | ICD-10-CM

## 2023-04-05 DIAGNOSIS — E119 Type 2 diabetes mellitus without complications: Secondary | ICD-10-CM | POA: Diagnosis not present

## 2023-04-05 LAB — POCT XPERT XPRESS SARS COVID-2/FLU/RSV
FLU A: NEGATIVE
FLU B: NEGATIVE
RSV RNA, PCR: NEGATIVE
SARS Coronavirus 2: NEGATIVE

## 2023-04-25 DIAGNOSIS — I739 Peripheral vascular disease, unspecified: Secondary | ICD-10-CM | POA: Diagnosis not present

## 2023-04-25 DIAGNOSIS — E1142 Type 2 diabetes mellitus with diabetic polyneuropathy: Secondary | ICD-10-CM | POA: Diagnosis not present

## 2023-04-25 DIAGNOSIS — B351 Tinea unguium: Secondary | ICD-10-CM | POA: Diagnosis not present

## 2023-04-25 DIAGNOSIS — L603 Nail dystrophy: Secondary | ICD-10-CM | POA: Diagnosis not present

## 2023-04-25 DIAGNOSIS — L84 Corns and callosities: Secondary | ICD-10-CM | POA: Diagnosis not present

## 2023-04-26 ENCOUNTER — Other Ambulatory Visit: Payer: Self-pay | Admitting: Internal Medicine

## 2023-04-26 DIAGNOSIS — J301 Allergic rhinitis due to pollen: Secondary | ICD-10-CM

## 2023-05-03 ENCOUNTER — Other Ambulatory Visit: Payer: Medicare HMO

## 2023-05-03 DIAGNOSIS — N400 Enlarged prostate without lower urinary tract symptoms: Secondary | ICD-10-CM | POA: Diagnosis not present

## 2023-05-04 LAB — PSA: Prostate Specific Ag, Serum: 2.7 ng/mL (ref 0.0–4.0)

## 2023-05-05 ENCOUNTER — Other Ambulatory Visit: Payer: Self-pay | Admitting: Neurology

## 2023-05-06 DIAGNOSIS — E119 Type 2 diabetes mellitus without complications: Secondary | ICD-10-CM | POA: Diagnosis not present

## 2023-05-07 ENCOUNTER — Ambulatory Visit (INDEPENDENT_AMBULATORY_CARE_PROVIDER_SITE_OTHER): Payer: Medicare HMO | Admitting: Internal Medicine

## 2023-05-07 ENCOUNTER — Encounter: Payer: Self-pay | Admitting: Internal Medicine

## 2023-05-07 VITALS — BP 124/86 | HR 70 | Ht 69.0 in | Wt 253.2 lb

## 2023-05-07 DIAGNOSIS — I10 Essential (primary) hypertension: Secondary | ICD-10-CM | POA: Diagnosis not present

## 2023-05-07 DIAGNOSIS — Z8673 Personal history of transient ischemic attack (TIA), and cerebral infarction without residual deficits: Secondary | ICD-10-CM

## 2023-05-07 DIAGNOSIS — E1159 Type 2 diabetes mellitus with other circulatory complications: Secondary | ICD-10-CM

## 2023-05-07 DIAGNOSIS — M170 Bilateral primary osteoarthritis of knee: Secondary | ICD-10-CM | POA: Diagnosis not present

## 2023-05-07 LAB — POCT CBG (FASTING - GLUCOSE)-MANUAL ENTRY: Glucose Fasting, POC: 165 mg/dL — AB (ref 70–99)

## 2023-05-07 MED ORDER — ACETAMINOPHEN 500 MG PO TABS: 1000.0000 mg | ORAL_TABLET | Freq: Four times a day (QID) | ORAL | 0 refills | Status: AC | PRN

## 2023-05-07 MED ORDER — DICLOFENAC SODIUM 1 % EX GEL
4.0000 g | Freq: Two times a day (BID) | CUTANEOUS | 0 refills | Status: AC
Start: 2023-05-07 — End: 2023-06-06
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
## 2023-05-13 ENCOUNTER — Other Ambulatory Visit: Payer: Self-pay | Admitting: Interventional Cardiology

## 2023-05-23 ENCOUNTER — Other Ambulatory Visit: Payer: Self-pay | Admitting: Internal Medicine

## 2023-05-27 ENCOUNTER — Ambulatory Visit: Payer: Medicare HMO

## 2023-05-27 ENCOUNTER — Other Ambulatory Visit: Payer: Self-pay

## 2023-05-27 MED ORDER — ACCU-CHEK AVIVA PLUS VI STRP: ORAL_STRIP | 3 refills | Status: DC

## 2023-05-27 MED ORDER — FREESTYLE LIBRE 2 SENSOR MISC: 2.0000 | 11 refills | Status: DC

## 2023-05-27 NOTE — Telephone Encounter (Signed)
 LVM, fill hx shows pt never filled Mounjaro 7.5 mg prescription.

## 2023-05-28 ENCOUNTER — Other Ambulatory Visit: Payer: Self-pay | Admitting: Internal Medicine

## 2023-05-30 ENCOUNTER — Telehealth: Payer: Self-pay | Admitting: Pharmacy Technician

## 2023-05-30 ENCOUNTER — Other Ambulatory Visit (HOSPITAL_COMMUNITY): Payer: Self-pay

## 2023-05-30 NOTE — Telephone Encounter (Signed)
 Pharmacy Patient Advocate Encounter   Received notification from RX Request Messages that prior authorization for mounjaro  is required/requested.   Insurance verification completed.   The patient is insured through  [employer]  .   Per test claim: The current 05/30/23 day co-pay is, $302.00 one month (DEDUCTIBLE).  No PA needed at this time. This test claim was processed through [facility]- copay amounts may vary at other pharmacies due to pharmacy/plan contracts, or as the patient moves through the different stages of their insurance plan.

## 2023-05-31 DIAGNOSIS — H40013 Open angle with borderline findings, low risk, bilateral: Secondary | ICD-10-CM | POA: Diagnosis not present

## 2023-05-31 DIAGNOSIS — H04123 Dry eye syndrome of bilateral lacrimal glands: Secondary | ICD-10-CM | POA: Diagnosis not present

## 2023-05-31 DIAGNOSIS — H53461 Homonymous bilateral field defects, right side: Secondary | ICD-10-CM | POA: Diagnosis not present

## 2023-05-31 DIAGNOSIS — E119 Type 2 diabetes mellitus without complications: Secondary | ICD-10-CM | POA: Diagnosis not present

## 2023-06-05 ENCOUNTER — Ambulatory Visit: Payer: Medicare Other | Admitting: Neurology

## 2023-06-05 ENCOUNTER — Other Ambulatory Visit: Payer: Self-pay | Admitting: Internal Medicine

## 2023-06-08 ENCOUNTER — Other Ambulatory Visit: Payer: Self-pay

## 2023-06-08 DIAGNOSIS — Z7984 Long term (current) use of oral hypoglycemic drugs: Secondary | ICD-10-CM

## 2023-06-08 DIAGNOSIS — Z955 Presence of coronary angioplasty implant and graft: Secondary | ICD-10-CM

## 2023-06-08 DIAGNOSIS — K8020 Calculus of gallbladder without cholecystitis without obstruction: Secondary | ICD-10-CM | POA: Diagnosis not present

## 2023-06-08 DIAGNOSIS — N179 Acute kidney failure, unspecified: Principal | ICD-10-CM | POA: Diagnosis present

## 2023-06-08 DIAGNOSIS — K7469 Other cirrhosis of liver: Secondary | ICD-10-CM | POA: Diagnosis not present

## 2023-06-08 DIAGNOSIS — Z6837 Body mass index (BMI) 37.0-37.9, adult: Secondary | ICD-10-CM

## 2023-06-08 DIAGNOSIS — Z96652 Presence of left artificial knee joint: Secondary | ICD-10-CM | POA: Diagnosis not present

## 2023-06-08 DIAGNOSIS — Z7985 Long-term (current) use of injectable non-insulin antidiabetic drugs: Secondary | ICD-10-CM

## 2023-06-08 DIAGNOSIS — E785 Hyperlipidemia, unspecified: Secondary | ICD-10-CM | POA: Diagnosis present

## 2023-06-08 DIAGNOSIS — E119 Type 2 diabetes mellitus without complications: Secondary | ICD-10-CM | POA: Diagnosis present

## 2023-06-08 DIAGNOSIS — R1011 Right upper quadrant pain: Secondary | ICD-10-CM | POA: Diagnosis not present

## 2023-06-08 DIAGNOSIS — K760 Fatty (change of) liver, not elsewhere classified: Secondary | ICD-10-CM | POA: Diagnosis not present

## 2023-06-08 DIAGNOSIS — K7581 Nonalcoholic steatohepatitis (NASH): Secondary | ICD-10-CM | POA: Diagnosis present

## 2023-06-08 DIAGNOSIS — I110 Hypertensive heart disease with heart failure: Secondary | ICD-10-CM | POA: Diagnosis not present

## 2023-06-08 DIAGNOSIS — Z7902 Long term (current) use of antithrombotics/antiplatelets: Secondary | ICD-10-CM | POA: Diagnosis not present

## 2023-06-08 DIAGNOSIS — A059 Bacterial foodborne intoxication, unspecified: Secondary | ICD-10-CM | POA: Diagnosis not present

## 2023-06-08 DIAGNOSIS — K862 Cyst of pancreas: Secondary | ICD-10-CM | POA: Diagnosis present

## 2023-06-08 DIAGNOSIS — G4733 Obstructive sleep apnea (adult) (pediatric): Secondary | ICD-10-CM | POA: Diagnosis present

## 2023-06-08 DIAGNOSIS — Z888 Allergy status to other drugs, medicaments and biological substances status: Secondary | ICD-10-CM

## 2023-06-08 DIAGNOSIS — E669 Obesity, unspecified: Secondary | ICD-10-CM | POA: Diagnosis present

## 2023-06-08 DIAGNOSIS — I5032 Chronic diastolic (congestive) heart failure: Secondary | ICD-10-CM | POA: Diagnosis not present

## 2023-06-08 DIAGNOSIS — Z860100 Personal history of colon polyps, unspecified: Secondary | ICD-10-CM

## 2023-06-08 DIAGNOSIS — K219 Gastro-esophageal reflux disease without esophagitis: Secondary | ICD-10-CM | POA: Diagnosis present

## 2023-06-08 DIAGNOSIS — Z1152 Encounter for screening for COVID-19: Secondary | ICD-10-CM | POA: Diagnosis not present

## 2023-06-08 DIAGNOSIS — Z87891 Personal history of nicotine dependence: Secondary | ICD-10-CM | POA: Diagnosis not present

## 2023-06-08 DIAGNOSIS — R1013 Epigastric pain: Secondary | ICD-10-CM | POA: Diagnosis not present

## 2023-06-08 DIAGNOSIS — I252 Old myocardial infarction: Secondary | ICD-10-CM

## 2023-06-08 DIAGNOSIS — Z87442 Personal history of urinary calculi: Secondary | ICD-10-CM

## 2023-06-08 DIAGNOSIS — Z833 Family history of diabetes mellitus: Secondary | ICD-10-CM | POA: Diagnosis not present

## 2023-06-08 DIAGNOSIS — E860 Dehydration: Secondary | ICD-10-CM | POA: Diagnosis present

## 2023-06-08 DIAGNOSIS — Z794 Long term (current) use of insulin: Secondary | ICD-10-CM

## 2023-06-08 DIAGNOSIS — J449 Chronic obstructive pulmonary disease, unspecified: Secondary | ICD-10-CM | POA: Diagnosis not present

## 2023-06-08 DIAGNOSIS — R1110 Vomiting, unspecified: Secondary | ICD-10-CM | POA: Diagnosis not present

## 2023-06-08 DIAGNOSIS — I2510 Atherosclerotic heart disease of native coronary artery without angina pectoris: Secondary | ICD-10-CM | POA: Diagnosis present

## 2023-06-08 DIAGNOSIS — Z96653 Presence of artificial knee joint, bilateral: Secondary | ICD-10-CM | POA: Diagnosis present

## 2023-06-08 DIAGNOSIS — R109 Unspecified abdominal pain: Secondary | ICD-10-CM | POA: Diagnosis not present

## 2023-06-08 DIAGNOSIS — Z809 Family history of malignant neoplasm, unspecified: Secondary | ICD-10-CM

## 2023-06-08 DIAGNOSIS — Z8673 Personal history of transient ischemic attack (TIA), and cerebral infarction without residual deficits: Secondary | ICD-10-CM

## 2023-06-08 DIAGNOSIS — Z79899 Other long term (current) drug therapy: Secondary | ICD-10-CM

## 2023-06-08 DIAGNOSIS — Z8249 Family history of ischemic heart disease and other diseases of the circulatory system: Secondary | ICD-10-CM

## 2023-06-08 NOTE — ED Triage Notes (Signed)
Pt to ed from home via POV for abd pain and emesis x 2 days. Pt is caox4, in no acute distress and ambulatory in triage. Pt denies any fevers or other symptoms at this time. Pt does still have his GB and appendix.

## 2023-06-09 ENCOUNTER — Inpatient Hospital Stay: Payer: Medicare Other

## 2023-06-09 ENCOUNTER — Encounter: Payer: Self-pay | Admitting: Radiology

## 2023-06-09 ENCOUNTER — Emergency Department: Payer: Medicare Other

## 2023-06-09 ENCOUNTER — Inpatient Hospital Stay
Admission: EM | Admit: 2023-06-09 | Discharge: 2023-06-12 | DRG: 683 | Disposition: A | Discharge status: Home / Self Care | Payer: Medicare Other | Attending: Obstetrics and Gynecology | Admitting: Obstetrics and Gynecology

## 2023-06-09 DIAGNOSIS — J449 Chronic obstructive pulmonary disease, unspecified: Secondary | ICD-10-CM | POA: Diagnosis present

## 2023-06-09 DIAGNOSIS — R1110 Vomiting, unspecified: Secondary | ICD-10-CM | POA: Diagnosis not present

## 2023-06-09 DIAGNOSIS — K7581 Nonalcoholic steatohepatitis (NASH): Secondary | ICD-10-CM | POA: Diagnosis not present

## 2023-06-09 DIAGNOSIS — K760 Fatty (change of) liver, not elsewhere classified: Secondary | ICD-10-CM | POA: Diagnosis present

## 2023-06-09 DIAGNOSIS — R7989 Other specified abnormal findings of blood chemistry: Secondary | ICD-10-CM | POA: Diagnosis present

## 2023-06-09 DIAGNOSIS — R7401 Elevation of levels of liver transaminase levels: Secondary | ICD-10-CM | POA: Diagnosis not present

## 2023-06-09 DIAGNOSIS — R1013 Epigastric pain: Secondary | ICD-10-CM | POA: Diagnosis not present

## 2023-06-09 DIAGNOSIS — K7469 Other cirrhosis of liver: Secondary | ICD-10-CM | POA: Diagnosis present

## 2023-06-09 DIAGNOSIS — I5032 Chronic diastolic (congestive) heart failure: Secondary | ICD-10-CM | POA: Diagnosis present

## 2023-06-09 DIAGNOSIS — Z833 Family history of diabetes mellitus: Secondary | ICD-10-CM | POA: Diagnosis not present

## 2023-06-09 DIAGNOSIS — Z1152 Encounter for screening for COVID-19: Secondary | ICD-10-CM | POA: Diagnosis not present

## 2023-06-09 DIAGNOSIS — N179 Acute kidney failure, unspecified: Secondary | ICD-10-CM

## 2023-06-09 DIAGNOSIS — I2510 Atherosclerotic heart disease of native coronary artery without angina pectoris: Secondary | ICD-10-CM | POA: Diagnosis present

## 2023-06-09 DIAGNOSIS — E785 Hyperlipidemia, unspecified: Secondary | ICD-10-CM | POA: Diagnosis present

## 2023-06-09 DIAGNOSIS — Z794 Long term (current) use of insulin: Secondary | ICD-10-CM | POA: Diagnosis not present

## 2023-06-09 DIAGNOSIS — K219 Gastro-esophageal reflux disease without esophagitis: Secondary | ICD-10-CM

## 2023-06-09 DIAGNOSIS — Z7902 Long term (current) use of antithrombotics/antiplatelets: Secondary | ICD-10-CM | POA: Diagnosis not present

## 2023-06-09 DIAGNOSIS — K759 Inflammatory liver disease, unspecified: Principal | ICD-10-CM

## 2023-06-09 DIAGNOSIS — K439 Ventral hernia without obstruction or gangrene: Secondary | ICD-10-CM | POA: Diagnosis not present

## 2023-06-09 DIAGNOSIS — Z955 Presence of coronary angioplasty implant and graft: Secondary | ICD-10-CM | POA: Diagnosis not present

## 2023-06-09 DIAGNOSIS — E119 Type 2 diabetes mellitus without complications: Secondary | ICD-10-CM

## 2023-06-09 DIAGNOSIS — K5730 Diverticulosis of large intestine without perforation or abscess without bleeding: Secondary | ICD-10-CM | POA: Diagnosis not present

## 2023-06-09 DIAGNOSIS — Z96652 Presence of left artificial knee joint: Secondary | ICD-10-CM | POA: Diagnosis present

## 2023-06-09 DIAGNOSIS — I110 Hypertensive heart disease with heart failure: Secondary | ICD-10-CM | POA: Diagnosis present

## 2023-06-09 DIAGNOSIS — R109 Unspecified abdominal pain: Secondary | ICD-10-CM | POA: Diagnosis not present

## 2023-06-09 DIAGNOSIS — K862 Cyst of pancreas: Secondary | ICD-10-CM | POA: Diagnosis present

## 2023-06-09 DIAGNOSIS — K8020 Calculus of gallbladder without cholecystitis without obstruction: Secondary | ICD-10-CM | POA: Diagnosis not present

## 2023-06-09 DIAGNOSIS — Z87891 Personal history of nicotine dependence: Secondary | ICD-10-CM | POA: Diagnosis not present

## 2023-06-09 DIAGNOSIS — G4733 Obstructive sleep apnea (adult) (pediatric): Secondary | ICD-10-CM | POA: Diagnosis present

## 2023-06-09 DIAGNOSIS — Z6837 Body mass index (BMI) 37.0-37.9, adult: Secondary | ICD-10-CM | POA: Diagnosis not present

## 2023-06-09 DIAGNOSIS — I10 Essential (primary) hypertension: Secondary | ICD-10-CM | POA: Diagnosis present

## 2023-06-09 DIAGNOSIS — R1011 Right upper quadrant pain: Secondary | ICD-10-CM | POA: Diagnosis present

## 2023-06-09 DIAGNOSIS — E669 Obesity, unspecified: Secondary | ICD-10-CM | POA: Diagnosis present

## 2023-06-09 DIAGNOSIS — E860 Dehydration: Secondary | ICD-10-CM | POA: Diagnosis present

## 2023-06-09 DIAGNOSIS — E1159 Type 2 diabetes mellitus with other circulatory complications: Secondary | ICD-10-CM | POA: Diagnosis present

## 2023-06-09 DIAGNOSIS — A059 Bacterial foodborne intoxication, unspecified: Secondary | ICD-10-CM | POA: Diagnosis present

## 2023-06-09 LAB — COMPREHENSIVE METABOLIC PANEL
ALT: 151 U/L — ABNORMAL HIGH (ref 0–44)
ALT: 257 U/L — ABNORMAL HIGH (ref 0–44)
AST: 215 U/L — ABNORMAL HIGH (ref 15–41)
AST: 626 U/L — ABNORMAL HIGH (ref 15–41)
Albumin: 3.3 g/dL — ABNORMAL LOW (ref 3.5–5.0)
Albumin: 3.5 g/dL (ref 3.5–5.0)
Alkaline Phosphatase: 102 U/L (ref 38–126)
Alkaline Phosphatase: 120 U/L (ref 38–126)
Anion gap: 12 (ref 5–15)
Anion gap: 13 (ref 5–15)
BUN: 20 mg/dL (ref 8–23)
BUN: 29 mg/dL — ABNORMAL HIGH (ref 8–23)
CO2: 21 mmol/L — ABNORMAL LOW (ref 22–32)
CO2: 22 mmol/L (ref 22–32)
Calcium: 8.1 mg/dL — ABNORMAL LOW (ref 8.9–10.3)
Calcium: 8.8 mg/dL — ABNORMAL LOW (ref 8.9–10.3)
Chloride: 103 mmol/L (ref 98–111)
Chloride: 104 mmol/L (ref 98–111)
Creatinine, Ser: 2.54 mg/dL — ABNORMAL HIGH (ref 0.61–1.24)
Creatinine, Ser: 4.6 mg/dL — ABNORMAL HIGH (ref 0.61–1.24)
GFR, Estimated: 13 mL/min — ABNORMAL LOW (ref >60)
GFR, Estimated: 26 mL/min — ABNORMAL LOW (ref >60)
Glucose, Bld: 103 mg/dL — ABNORMAL HIGH (ref 70–99)
Glucose, Bld: 191 mg/dL — ABNORMAL HIGH (ref 70–99)
Potassium: 4.3 mmol/L (ref 3.5–5.1)
Potassium: 4.4 mmol/L (ref 3.5–5.1)
Sodium: 136 mmol/L (ref 135–145)
Sodium: 139 mmol/L (ref 135–145)
Total Bilirubin: 2.6 mg/dL — ABNORMAL HIGH (ref 0.0–1.2)
Total Bilirubin: 3.6 mg/dL — ABNORMAL HIGH (ref 0.0–1.2)
Total Protein: 6.6 g/dL (ref 6.5–8.1)
Total Protein: 7.4 g/dL (ref 6.5–8.1)

## 2023-06-09 LAB — CREATININE, URINE, RANDOM: Creatinine, Urine: 155 mg/dL

## 2023-06-09 LAB — URINALYSIS, ROUTINE W REFLEX MICROSCOPIC
Bacteria, UA: NONE SEEN
Bilirubin Urine: NEGATIVE
Glucose, UA: NEGATIVE mg/dL
Ketones, ur: NEGATIVE mg/dL
Leukocytes,Ua: NEGATIVE
Nitrite: NEGATIVE
Protein, ur: 30 mg/dL — AB
RBC / HPF: 0 RBC/hpf (ref 0–5)
Specific Gravity, Urine: 1.011 (ref 1.005–1.030)
pH: 5 (ref 5.0–8.0)

## 2023-06-09 LAB — RESP PANEL BY RT-PCR (RSV, FLU A&B, COVID)  RVPGX2
Influenza A by PCR: NEGATIVE
Influenza B by PCR: NEGATIVE
Resp Syncytial Virus by PCR: NEGATIVE
SARS Coronavirus 2 by RT PCR: NEGATIVE

## 2023-06-09 LAB — CBC
HCT: 39.4 % (ref 39.0–52.0)
Hemoglobin: 13.3 g/dL (ref 13.0–17.0)
MCH: 28.1 pg (ref 26.0–34.0)
MCHC: 33.8 g/dL (ref 30.0–36.0)
MCV: 83.1 fL (ref 80.0–100.0)
Platelets: 196 10*3/uL (ref 150–400)
RBC: 4.74 MIL/uL (ref 4.22–5.81)
RDW: 13.8 % (ref 11.5–15.5)
WBC: 9 10*3/uL (ref 4.0–10.5)
nRBC: 0 % (ref 0.0–0.2)

## 2023-06-09 LAB — BILIRUBIN, FRACTIONATED(TOT/DIR/INDIR)
Bilirubin, Direct: 1.6 mg/dL — ABNORMAL HIGH (ref 0.0–0.2)
Indirect Bilirubin: 2 mg/dL — ABNORMAL HIGH (ref 0.3–0.9)
Total Bilirubin: 3.6 mg/dL — ABNORMAL HIGH (ref 0.0–1.2)

## 2023-06-09 LAB — LIPASE, BLOOD: Lipase: 32 U/L (ref 11–51)

## 2023-06-09 LAB — SODIUM, URINE, RANDOM: Sodium, Ur: 49 mmol/L

## 2023-06-09 LAB — HEPATITIS PANEL, ACUTE
HCV Ab: NONREACTIVE
Hep A IgM: NONREACTIVE
Hep B C IgM: NONREACTIVE
Hepatitis B Surface Ag: NONREACTIVE

## 2023-06-09 LAB — CBG MONITORING, ED: Glucose-Capillary: 166 mg/dL — ABNORMAL HIGH (ref 70–99)

## 2023-06-09 LAB — ETHANOL: Alcohol, Ethyl (B): <10 mg/dL (ref <10)

## 2023-06-09 LAB — ACETAMINOPHEN LEVEL: Acetaminophen (Tylenol), Serum: <10 ug/mL — ABNORMAL LOW (ref 10–30)

## 2023-06-09 MED ORDER — ONDANSETRON HCL 4 MG/2ML IJ SOLN
4.0000 mg | Freq: Once | INTRAMUSCULAR | Status: AC
  Administered 2023-06-09: 4 mg via INTRAVENOUS
  Filled 2023-06-09: qty 2

## 2023-06-09 MED ORDER — MORPHINE SULFATE (PF) 4 MG/ML IV SOLN
4.0000 mg | Freq: Once | INTRAVENOUS | Status: AC
  Administered 2023-06-09: 4 mg via INTRAVENOUS
  Filled 2023-06-09: qty 1

## 2023-06-09 MED ORDER — SODIUM CHLORIDE 0.9 % IV SOLN: INTRAVENOUS | Status: AC

## 2023-06-09 MED ORDER — CLOPIDOGREL BISULFATE 75 MG PO TABS
75.0000 mg | ORAL_TABLET | Freq: Every day | ORAL | Status: DC
  Administered 2023-06-09 – 2023-06-12 (×4): 75 mg via ORAL
  Filled 2023-06-09 (×4): qty 1

## 2023-06-09 MED ORDER — ENOXAPARIN SODIUM 40 MG/0.4ML IJ SOSY: 40.0000 mg | PREFILLED_SYRINGE | INTRAMUSCULAR | Status: DC

## 2023-06-09 MED ORDER — SODIUM CHLORIDE 0.9 % IV BOLUS
1000.0000 mL | Freq: Once | INTRAVENOUS | Status: AC
  Administered 2023-06-09: 1000 mL via INTRAVENOUS

## 2023-06-09 MED ORDER — CARVEDILOL 12.5 MG PO TABS
12.5000 mg | ORAL_TABLET | Freq: Two times a day (BID) | ORAL | Status: DC
  Administered 2023-06-09 – 2023-06-12 (×6): 12.5 mg via ORAL
  Filled 2023-06-09: qty 1
  Filled 2023-06-09 (×2): qty 2
  Filled 2023-06-09: qty 1
  Filled 2023-06-09 (×2): qty 2
  Filled 2023-06-09 (×2): qty 1

## 2023-06-09 MED ORDER — PANTOPRAZOLE SODIUM 40 MG IV SOLR
40.0000 mg | Freq: Two times a day (BID) | INTRAVENOUS | Status: DC
  Administered 2023-06-09 – 2023-06-10 (×3): 40 mg via INTRAVENOUS
  Filled 2023-06-09 (×3): qty 10

## 2023-06-09 MED ORDER — MORPHINE SULFATE (PF) 2 MG/ML IV SOLN
2.0000 mg | INTRAVENOUS | Status: DC | PRN
  Administered 2023-06-09 – 2023-06-10 (×2): 2 mg via INTRAVENOUS
  Filled 2023-06-09 (×2): qty 1

## 2023-06-09 MED ORDER — ONDANSETRON HCL 4 MG/2ML IJ SOLN: 4.0000 mg | Freq: Four times a day (QID) | INTRAMUSCULAR | Status: DC | PRN

## 2023-06-09 MED ORDER — ONDANSETRON HCL 4 MG PO TABS: 4.0000 mg | ORAL_TABLET | Freq: Four times a day (QID) | ORAL | Status: DC | PRN

## 2023-06-09 MED ORDER — ROSUVASTATIN CALCIUM 20 MG PO TABS
20.0000 mg | ORAL_TABLET | Freq: Every day | ORAL | Status: DC
  Administered 2023-06-09 – 2023-06-10 (×2): 20 mg via ORAL
  Filled 2023-06-09 (×3): qty 1

## 2023-06-09 MED ORDER — GADOBUTROL 1 MMOL/ML IV SOLN
10.0000 mL | Freq: Once | INTRAVENOUS | Status: AC | PRN
  Administered 2023-06-09: 10 mL via INTRAVENOUS

## 2023-06-09 MED ORDER — SPIRONOLACTONE 25 MG PO TABS: 25.0000 mg | ORAL_TABLET | Freq: Every day | ORAL | Status: DC

## 2023-06-09 MED ORDER — FUROSEMIDE 40 MG PO TABS: 40.0000 mg | ORAL_TABLET | Freq: Every day | ORAL | Status: DC

## 2023-06-09 MED ORDER — ENOXAPARIN SODIUM 60 MG/0.6ML IJ SOSY
0.5000 mg/kg | PREFILLED_SYRINGE | INTRAMUSCULAR | Status: DC
  Administered 2023-06-09: 57.5 mg via SUBCUTANEOUS
  Filled 2023-06-09: qty 0.6

## 2023-06-09 NOTE — Assessment & Plan Note (Signed)
BP stable Titrate home regimen 

## 2023-06-09 NOTE — Assessment & Plan Note (Signed)
Creatinine 2.54 on presentation with GFR in the 20s Suspect prerenal etiology given nausea and decreased p.o. intake Will check FENa to correlate IV fluid hydration Hold nephrotoxic agents Monitor

## 2023-06-09 NOTE — Assessment & Plan Note (Signed)
CAD, PTCA of the distal RCA, 1998; DE stent RCA 09/2004  No active chest pain  Cont home regimen

## 2023-06-09 NOTE — Assessment & Plan Note (Addendum)
Upper abdominal pain  Hyperbilribunemia  AST 626, ALT 257 on presentation T. bili 3.6, D bili 1.6, U bili 2  Noted baseline NASH cirrhosis Right upper quadrant quadrant ultrasound positive for cholelithiasis without cholecystitis MRCP negative for choledocholithiasis but does show pancreatic cyst-evaluated January 2024 felt to be benign Hepatitis, Tylenol panel pending Case discussed with Dr. Timothy Lasso who will formally consult Will place on IV PPI for gastritis coverage given epigastric pain

## 2023-06-09 NOTE — Progress Notes (Signed)
PHARMACIST - PHYSICIAN COMMUNICATION  CONCERNING:  Enoxaparin (Lovenox) for DVT Prophylaxis    RECOMMENDATION: Patient was prescribed enoxaprin 40mg  q24 hours for VTE prophylaxis.   Filed Weights   06/08/23 2340  Weight: 115 kg (253 lb 8.5 oz)    Body mass index is 37.44 kg/m.  Estimated Creatinine Clearance: 32.9 mL/min (A) (by C-G formula based on SCr of 2.54 mg/dL (H)).   Based on [facility] policy patient is candidate for enoxaparin 0.5mg /kg TBW SQ every 24 hours based on BMI being >30.  DESCRIPTION: Pharmacy has adjusted enoxaparin dose per [facility] policy.  Patient is now receiving enoxaparin 0.5 mg/kg every 24 hours    Lowella Bandy, PharmD Clinical Pharmacist  06/09/2023 8:38 AM

## 2023-06-09 NOTE — Consult Note (Signed)
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 unremarkable. Vascular/Lymphatic: No aneurysm identified in the visualized abdominal vasculature. No lymphadenopathy noted in the abdomen. Other: No significant volume of ascites noted in the visualized portions of the peritoneal cavity. Musculoskeletal: No aggressive appearing osseous lesions are noted in the visualized portions of the skeleton. IMPRESSION: 1. 2.2 x 1.4 cm cystic lesion in the pancreas, stable in size compared to the recent prior study. This is favored to represent a benign cystic neoplasm such as a side branch IPMN. Repeat abdominal MRI with and without IV gadolinium with MRCP is recommended in 12 months to ensure continued stability. This recommendation follows ACR consensus guidelines: Management of Incidental Pancreatic Cysts: A White Paper of the ACR Incidental Findings Committee. J Am Coll Radiol 2017;14:911-923. 2. Cholelithiasis without evidence of acute cholecystitis. 3. No choledocholithiasis or findings to suggest biliary obstruction. 4. Hepatic steatosis. Electronically Signed   By: Trudie Reed M.D.   On:  06/09/2023 05:55   US Abdomen Limited RUQ (LIVER/GB) Result Date: 06/09/2023 CLINICAL DATA:  Epigastric pain EXAM: ULTRASOUND ABDOMEN LIMITED RIGHT UPPER QUADRANT COMPARISON:  Ultrasound 09/28/2021 and MRI 04/06/2022 FINDINGS: Gallbladder: Multiple gallstones measuring up to 1.1 cm. No pericholecystic fluid or wall thickening visualized. No sonographic Murphy sign noted by sonographer. Common bile duct: Diameter: 6 mm.  No intrahepatic biliary dilation. Liver: No focal lesion identified. Increased parenchymal echogenicity. Portal vein is patent on color Doppler imaging with normal direction of blood flow towards the liver. Other: None. IMPRESSION: 1. Cholelithiasis without evidence of acute cholecystitis. 2. Hepatic steatosis. Electronically Signed   By: Minerva Fester M.D.   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 No biliary ductal dilation or signs of obstruction No new medications Continue to trend lfts Imaging and labs reviewed Viral hepatitis panel pending Previous autoimmune w/u negative I reviewed his previous endoscopy/pathology reports and gi office notes Continue ppi  Will follow labs peripherally. Should further GI needs arise, please call back  Management of other medical comorbidities as per primary team  I personally performed the service.  Thank you for  allowing [place] to participate in this patient's care. Please don't hesitate to call if any questions or concerns arise.   Jaynie Collins, DO [facility]  Portions of the record may have been created with voice recognition software. Occasional wrong-word or 'sound-a-like' substitutions may have occurred due to the inherent limitations of voice recognition software.  Read the chart carefully and recognize, using context, where substitutions may have occurred.

## 2023-06-09 NOTE — ED Notes (Signed)
Pt made aware that we need a urine sample.

## 2023-06-09 NOTE — Assessment & Plan Note (Signed)
SSI  A1C  
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
## 2023-06-09 NOTE — Assessment & Plan Note (Signed)
2D ECHO 08/2021 w/ EF 60-65% and grade 1 DD  Euvolemic on exam  Monitor volume status

## 2023-06-09 NOTE — Assessment & Plan Note (Signed)
?   Statin.

## 2023-06-09 NOTE — ED Provider Notes (Signed)
[facility] Provider Note    Event Date/Time   First MD Initiated Contact with Patient 06/09/23 0139     (approximate)   History   Chief Complaint Abdominal Pain (X 2 days)   HPI  James Maxwell is a 73 y.o. male with past medical history of hypertension, diabetes, CHF who presents to the ED complaining abdominal pain.  Patient reports that he has had 2 days of increasing pain in his epigastrium associated with nausea and vomiting.  He has been unable to keep anything down during this time but denies any diarrhea.  He has not had any fevers, dysuria, or flank pain.  He reports a history of a pancreatic cyst, had this biopsied last year but was told it was not cancerous.     Physical Exam   Triage Vital Signs: ED Triage Vitals  Encounter Vitals Group     BP 06/08/23 2346 (!) 130/53     Systolic BP Percentile --      Diastolic BP Percentile --      Pulse Rate 06/08/23 2346 78     Resp 06/08/23 2346 15     Temp 06/08/23 2346 98.7 F (37.1 C)     Temp Source 06/08/23 2346 Oral     SpO2 06/08/23 2346 96 %     Weight 06/08/23 2340 253 lb 8.5 oz (115 kg)     Height 06/08/23 2340 5\' 9"  (1.753 m)     Head Circumference --      Peak Flow --      Pain Score 06/08/23 2340 9     Pain Loc --      Pain Education --      Exclude from Growth Chart --     Most recent vital signs: Vitals:   06/09/23 0230 06/09/23 0530  BP: (!) 133/57 126/60  Pulse: 69 61  Resp: 16 18  Temp:    SpO2: 96% 96%    Constitutional: Alert and oriented. Eyes: Conjunctivae are normal. Head: Atraumatic. Nose: No congestion/rhinnorhea. Mouth/Throat: Mucous membranes are moist.  Cardiovascular: Normal rate, regular rhythm. Grossly normal heart sounds.  2+ radial pulses bilaterally. Respiratory: Normal respiratory effort.  No retractions. Lungs CTAB. Gastrointestinal: Soft and tender to palpation in the bilateral upper quadrants with no rebound or guarding. No  distention. Musculoskeletal: No lower extremity tenderness nor edema.  Neurologic:  Normal speech and language. No gross focal neurologic deficits are appreciated.    ED Results / Procedures / Treatments   Labs (all labs ordered are listed, but only abnormal results are displayed) Labs Reviewed  COMPREHENSIVE METABOLIC PANEL - Abnormal; Notable for the following components:      Result Value   Glucose, Bld 103 (*)    Creatinine, Ser 2.54 (*)    Calcium 8.8 (*)    AST 626 (*)    ALT 257 (*)    Total Bilirubin 3.6 (*)    GFR, Estimated 26 (*)    All other components within normal limits  URINALYSIS, ROUTINE W REFLEX MICROSCOPIC - Abnormal; Notable for the following components:   Color, Urine AMBER (*)    APPearance HAZY (*)    Hgb urine dipstick SMALL (*)    Protein, ur 30 (*)    All other components within normal limits  BILIRUBIN, FRACTIONATED(TOT/DIR/INDIR) - Abnormal; Notable for the following components:   Total Bilirubin 3.6 (*)    Bilirubin, Direct 1.6 (*)    Indirect Bilirubin 2.0 (*)  All other components within normal limits  RESP PANEL BY RT-PCR (RSV, FLU A&B, COVID)  RVPGX2  LIPASE, BLOOD  CBC  HEPATITIS PANEL, ACUTE   RADIOLOGY Right upper quadrant ultrasound reviewed and interpreted by me with cholelithiasis but no evidence of cholecystitis.  PROCEDURES:  Critical Care performed: No  Procedures   MEDICATIONS ORDERED IN ED: Medications  sodium chloride 0.9 % bolus 1,000 mL (0 mLs Intravenous Stopped 06/09/23 0400)  morphine (PF) 4 MG/ML injection 4 mg (4 mg Intravenous Given 06/09/23 0233)  ondansetron (ZOFRAN) injection 4 mg (4 mg Intravenous Given 06/09/23 0233)  gadobutrol (GADAVIST) 1 MMOL/ML injection 10 mL (10 mLs Intravenous Contrast Given 06/09/23 0415)     IMPRESSION / MDM / ASSESSMENT AND PLAN / ED COURSE  I reviewed the triage vital signs and the nursing notes.                              73 y.o. male with past medical history of  hypertension, diabetes, and CHF who presents to the ED with 2 days of increasing epigastric pain with nausea and multiple episodes of vomiting.  Patient's presentation is most consistent with acute presentation with potential threat to life or bodily function.  Differential diagnosis includes, but is not limited to, pancreatitis, hepatitis, biliary colic, cholecystitis, choledocholithiasis, gastroenteritis duration, electrolyte abnormality, AKI.  Patient nontoxic-appearing and in no acute distress, vital signs are unremarkable.  Patient's abdomen is soft but he does have tenderness in the bilateral upper quadrants.  We will treat symptomatically with IV morphine and Zofran, hydrate with IV fluids.  Labs show no significant anemia or leukocytosis, but he does have significant AKI, no electrolyte abnormality noted.  Patient also with significant transaminitis with AST greater than ALT and elevation in his bilirubin.  Alkaline phosphatase within normal limits, we will further assess with right upper quadrant ultrasound for biliary pathology.  Right upper quadrant ultrasound with cholelithiasis but no evidence of cholecystitis, CBD measured at 6 mm.  Patient with both direct and indirect bilirubinemia, MRCP performed and shows no evidence of choledocholithiasis.  Patient does have pancreatic cyst that was previously biopsied, may be followed up as an outpatient.  He would benefit from admission given significant hepatitis with AKI.  Case discussed with hospitalist for admission.      FINAL CLINICAL IMPRESSION(S) / ED DIAGNOSES   Final diagnoses:  Hepatitis  Calculus of gallbladder without cholecystitis without obstruction  AKI (acute kidney injury) (HCC)     Rx / DC Orders   ED Discharge Orders     None        Note:  This document was prepared using Dragon voice recognition software and may include unintentional dictation errors.   Chesley Noon, MD 06/09/23 [phone]

## 2023-06-10 DIAGNOSIS — R7989 Other specified abnormal findings of blood chemistry: Secondary | ICD-10-CM | POA: Diagnosis not present

## 2023-06-10 LAB — CBG MONITORING, ED
Glucose-Capillary: 178 mg/dL — ABNORMAL HIGH (ref 70–99)
Glucose-Capillary: 169 mg/dL — ABNORMAL HIGH (ref 70–99)

## 2023-06-10 LAB — COMPREHENSIVE METABOLIC PANEL
ALT: 130 U/L — ABNORMAL HIGH (ref 0–44)
AST: 162 U/L — ABNORMAL HIGH (ref 15–41)
Albumin: 3.1 g/dL — ABNORMAL LOW (ref 3.5–5.0)
Alkaline Phosphatase: 104 U/L (ref 38–126)
Anion gap: 9 (ref 5–15)
BUN: 31 mg/dL — ABNORMAL HIGH (ref 8–23)
CO2: 22 mmol/L (ref 22–32)
Calcium: 8 mg/dL — ABNORMAL LOW (ref 8.9–10.3)
Chloride: 106 mmol/L (ref 98–111)
Creatinine, Ser: 4.51 mg/dL — ABNORMAL HIGH (ref 0.61–1.24)
GFR, Estimated: 13 mL/min — ABNORMAL LOW (ref >60)
Glucose, Bld: 157 mg/dL — ABNORMAL HIGH (ref 70–99)
Potassium: 4.3 mmol/L (ref 3.5–5.1)
Sodium: 137 mmol/L (ref 135–145)
Total Bilirubin: 2.2 mg/dL — ABNORMAL HIGH (ref 0.0–1.2)
Total Protein: 6.4 g/dL — ABNORMAL LOW (ref 6.5–8.1)

## 2023-06-10 LAB — CBC
HCT: 36.2 % — ABNORMAL LOW (ref 39.0–52.0)
Hemoglobin: 12.1 g/dL — ABNORMAL LOW (ref 13.0–17.0)
MCH: 28.7 pg (ref 26.0–34.0)
MCHC: 33.4 g/dL (ref 30.0–36.0)
MCV: 85.8 fL (ref 80.0–100.0)
Platelets: 159 10*3/uL (ref 150–400)
RBC: 4.22 MIL/uL (ref 4.22–5.81)
RDW: 14.3 % (ref 11.5–15.5)
WBC: 7.4 10*3/uL (ref 4.0–10.5)
nRBC: 0 % (ref 0.0–0.2)

## 2023-06-10 LAB — GLUCOSE, CAPILLARY: Glucose-Capillary: 167 mg/dL — ABNORMAL HIGH (ref 70–99)

## 2023-06-10 MED ORDER — INSULIN ASPART 100 UNIT/ML IJ SOLN: 0.0000 [IU] | Freq: Every day | INTRAMUSCULAR | Status: DC

## 2023-06-10 MED ORDER — INSULIN ASPART 100 UNIT/ML IJ SOLN
0.0000 [IU] | Freq: Three times a day (TID) | INTRAMUSCULAR | Status: DC
Start: 2023-06-10 — End: 2023-06-12
  Administered 2023-06-10 (×2): 2 [IU] via SUBCUTANEOUS
  Administered 2023-06-11 (×2): 3 [IU] via SUBCUTANEOUS
  Administered 2023-06-11: 1 [IU] via SUBCUTANEOUS
  Administered 2023-06-12: 2 [IU] via SUBCUTANEOUS
  Filled 2023-06-10 (×6): qty 1

## 2023-06-10 MED ORDER — SODIUM CHLORIDE 0.9 % IV SOLN: INTRAVENOUS | Status: AC

## 2023-06-10 MED ORDER — HEPARIN SODIUM (PORCINE) 5000 UNIT/ML IJ SOLN
5000.0000 [IU] | Freq: Three times a day (TID) | INTRAMUSCULAR | Status: DC
  Administered 2023-06-11 – 2023-06-12 (×3): 5000 [IU] via SUBCUTANEOUS
  Filled 2023-06-10 (×4): qty 1

## 2023-06-10 MED ORDER — DEXTROSE-SODIUM CHLORIDE 5-0.45 % IV SOLN: INTRAVENOUS | Status: DC

## 2023-06-10 MED ORDER — HEPARIN SODIUM (PORCINE) 5000 UNIT/ML IJ SOLN: 5000.0000 [IU] | Freq: Three times a day (TID) | INTRAMUSCULAR | Status: DC

## 2023-06-10 NOTE — Progress Notes (Signed)
PROGRESS NOTE    James Maxwell  UJW:[id number] DOB: 20-Jun-1950 DOA: 06/09/2023 PCP: Sherron Monday, MD  Outpatient Specialists: gi    Brief Narrative:   From admission h and p  James Maxwell is a 73 y.o. male with medical history significant of obesity, CAD status post stenting, GERD, NASH cirrhosis, gout, hypertension, hyperlipidemia, CPAP presenting with abdominal pain, transaminitis, hyperbilirubinemia.  Patient reports approximately 2 days of epigastric as well as right upper quadrant pain.  Has had worsening difficulty with eating. Progressive nausea.  No diarrhea. Noted prior history of Elita Boone cirrhosis as well as pancreatic cysts status post EGD evaluation in January 2024. Felt to be noncancerous. Denies any ETOH use. Non smoker. No reported heavy tylenol use. Has tried to eat with worsening symptoms.    Assessment & Plan:   Principal Problem:   Abnormal LFTs (liver function tests) Active Problems:   Transaminitis   AKI (acute kidney injury) (HCC)   Hypertension   Coronary atherosclerosis of native coronary artery   Hyperlipidemia   Type 2 diabetes mellitus with vascular disease (HCC)   Chronic heart failure with preserved ejection fraction (HFpEF) (HCC)   Fatty liver   Other cirrhosis of liver (HCC)   # AKI Suspect prerenal from GI illness, ongoing use of diuretics. CT no obstruction. Discussed w/ GI, we do not think this is hepatorenal, so holding on albumin - continue fluids and trend  # Transaminitis # NAFLD Acute hepatitis panel neg, much improved with fluids, CT abdomen nothing acute, so think this most likely hypoperfusion from dehydration. No ascites or varices, synthetic function of liver appears to be preserved. - continue fluids and trend  # Vomiting # Abdominal pain Appears to be non-specific GI illness that has resolved. No diarrhea to test. CT abdomen/pelvis nothing acute. Abd pain resolved today - monitor  # HTN BP appropriate - holding home  losartan, lasix, spiro - cont home coreg  # CAD Asymptomatic - cont home plavix, coreg - hold home statin given transaminitis  # T2DM Euglycemic - holding home meds - SSI for now  # HFpEF - holding home diuretics given dehydration    DVT prophylaxis: lovenox Code Status: stop lovenox given severe kidney dysfunction, start heparin Family Communication: wife updated @ bedside  Level of care: Med-Surg Status is: Inpatient Remains inpatient appropriate because: sev    Consultants:  GI (signed off)  Procedures: none  Antimicrobials:  none    Subjective: Reports feeling well, tolerated breakfast  Objective: Vitals:   06/10/23 0800 06/10/23 0915 06/10/23 0916 06/10/23 0919  BP: 118/64   (!) 116/48  Pulse: 65 70  70  Resp:    15  Temp:   97.8 F (36.6 C)   TempSrc:   Oral   SpO2: 97% 96%  94%  Weight:      Height:        Intake/Output Summary (Last 24 hours) at 06/10/2023 1058 Last data filed at 06/10/2023 0845 Gross per 24 hour  Intake --  Output 325 ml  Net -325 ml   Filed Weights   06/08/23 2340  Weight: 115 kg    Examination:  General exam: Appears calm and comfortable  Respiratory system: Clear to auscultation. Respiratory effort normal. Cardiovascular system: S1 & S2 heard, RRR. No JVD, murmurs, rubs, gallops or clicks. No pedal edema. Gastrointestinal system: Abdomen is nondistended, soft and nontender. No organomegaly or masses felt. Normal bowel sounds heard. Central nervous system: Alert and oriented. No focal neurological deficits. Extremities:  Symmetric 5 x 5 power. Skin: No rashes, lesions or ulcers Psychiatry: Judgement and insight appear normal. Mood & affect appropriate.     Data Reviewed: I have personally reviewed following labs and imaging studies  CBC: Recent Labs  Lab 06/08/23 2346 06/10/23 0416  WBC 9.0 7.4  HGB 13.3 12.1*  HCT 39.4 36.2*  MCV 83.1 85.8  PLT 196 159   Basic Metabolic Panel: Recent Labs  Lab  06/08/23 2346 06/09/23 2302 06/10/23 0416  NA 139 136 137  K 4.3 4.4 4.3  CL 104 103 106  CO2 22 21* 22  GLUCOSE 103* 191* 157*  BUN 20 29* 31*  CREATININE 2.54* 4.60* 4.51*  CALCIUM 8.8* 8.1* 8.0*   GFR: Estimated Creatinine Clearance: 18.5 mL/min (A) (by C-G formula based on SCr of 4.51 mg/dL (H)). Liver Function Tests: Recent Labs  Lab 06/08/23 2346 06/09/23 2302 06/10/23 0416  AST 626* 215* 162*  ALT 257* 151* 130*  ALKPHOS 102 120 104  BILITOT 3.6*  3.6* 2.6* 2.2*  PROT 7.4 6.6 6.4*  ALBUMIN 3.5 3.3* 3.1*   Recent Labs  Lab 06/08/23 2346  LIPASE 32   No results for input(s): "AMMONIA" in the last 168 hours. Coagulation Profile: No results for input(s): "INR", "PROTIME" in the last 168 hours. Cardiac Enzymes: No results for input(s): "CKTOTAL", "CKMB", "CKMBINDEX", "TROPONINI" in the last 168 hours. BNP (last 3 results) No results for input(s): "PROBNP" in the last 8760 hours. HbA1C: No results for input(s): "HGBA1C" in the last 72 hours. CBG: Recent Labs  Lab 06/09/23 1055  GLUCAP 166*   Lipid Profile: No results for input(s): "CHOL", "HDL", "LDLCALC", "TRIG", "CHOLHDL", "LDLDIRECT" in the last 72 hours. Thyroid Function Tests: No results for input(s): "TSH", "T4TOTAL", "FREET4", "T3FREE", "THYROIDAB" in the last 72 hours. Anemia Panel: No results for input(s): "VITAMINB12", "FOLATE", "FERRITIN", "TIBC", "IRON", "RETICCTPCT" in the last 72 hours. Urine analysis:    Component Value Date/Time   COLORURINE AMBER (A) 06/08/2023 2346   APPEARANCEUR HAZY (A) 06/08/2023 2346   LABSPEC 1.011 06/08/2023 2346   PHURINE 5.0 06/08/2023 2346   GLUCOSEU NEGATIVE 06/08/2023 2346   HGBUR SMALL (A) 06/08/2023 2346   BILIRUBINUR NEGATIVE 06/08/2023 2346   BILIRUBINUR neg 02/03/2013 1751   KETONESUR NEGATIVE 06/08/2023 2346   PROTEINUR 30 (A) 06/08/2023 2346   UROBILINOGEN 0.2 03/06/2015 1830   NITRITE NEGATIVE 06/08/2023 2346   LEUKOCYTESUR NEGATIVE 06/08/2023  2346   Sepsis Labs: @LABRCNTIP (procalcitonin:4,lacticidven:4)  ) Recent Results (from the past 240 hours)  Resp panel by RT-PCR (RSV, Flu A&B, Covid) Anterior Nasal Swab     Status: None   Collection Time: 06/08/23 11:46 PM   Specimen: Anterior Nasal Swab  Result Value Ref Range Status   SARS Coronavirus 2 by RT PCR NEGATIVE NEGATIVE Final    Comment: (NOTE) SARS-CoV-2 target nucleic acids are NOT DETECTED.  The SARS-CoV-2 RNA is generally detectable in upper respiratory specimens during the acute phase of infection. The lowest concentration of SARS-CoV-2 viral copies this assay can detect is 138 copies/mL. A negative result does not preclude SARS-Cov-2 infection and should not be used as the sole basis for treatment or other patient management decisions. A negative result may occur with  improper specimen collection/handling, submission of specimen other than nasopharyngeal swab, presence of viral mutation(s) within the areas targeted by this assay, and inadequate number of viral copies(<138 copies/mL). A negative result must be combined with clinical observations, patient history, and epidemiological information. The expected result is Negative.  Fact Sheet for Patients:  BloggerCourse.com  Fact Sheet for Healthcare Providers:  SeriousBroker.it  This test is no t yet approved or cleared by the [place] FDA and  has been authorized for detection and/or diagnosis of SARS-CoV-2 by FDA under an Emergency Use Authorization (EUA). This EUA will remain  in effect (meaning this test can be used) for the duration of the COVID-19 declaration under Section 564(b)(1) of the Act, 21 U.S.C.section 360bbb-3(b)(1), unless the authorization is terminated  or revoked sooner.       Influenza A by PCR NEGATIVE NEGATIVE Final   Influenza B by PCR NEGATIVE NEGATIVE Final    Comment: (NOTE) The Xpert Xpress SARS-CoV-2/FLU/RSV plus assay  is intended as an aid in the diagnosis of influenza from Nasopharyngeal swab specimens and should not be used as a sole basis for treatment. Nasal washings and aspirates are unacceptable for Xpert Xpress SARS-CoV-2/FLU/RSV testing.  Fact Sheet for Patients: BloggerCourse.com  Fact Sheet for Healthcare Providers: SeriousBroker.it  This test is not yet approved or cleared by the [place] FDA and has been authorized for detection and/or diagnosis of SARS-CoV-2 by FDA under an Emergency Use Authorization (EUA). This EUA will remain in effect (meaning this test can be used) for the duration of the COVID-19 declaration under Section 564(b)(1) of the Act, 21 U.S.C. section 360bbb-3(b)(1), unless the authorization is terminated or revoked.     Resp Syncytial Virus by PCR NEGATIVE NEGATIVE Final    Comment: (NOTE) Fact Sheet for Patients: BloggerCourse.com  Fact Sheet for Healthcare Providers: SeriousBroker.it  This test is not yet approved or cleared by the [place] FDA and has been authorized for detection and/or diagnosis of SARS-CoV-2 by FDA under an Emergency Use Authorization (EUA). This EUA will remain in effect (meaning this test can be used) for the duration of the COVID-19 declaration under Section 564(b)(1) of the Act, 21 U.S.C. section 360bbb-3(b)(1), unless the authorization is terminated or revoked.  Performed at [facility], [address] [place], [place] [address]          Radiology Studies: CT ABDOMEN PELVIS WO CONTRAST Result Date: 06/09/2023 CLINICAL DATA:  Abdominal pain, acute, nonlocalized. EXAM: CT ABDOMEN AND PELVIS WITHOUT CONTRAST TECHNIQUE: Multidetector CT imaging of the abdomen and pelvis was performed following the standard protocol without IV contrast. RADIATION DOSE REDUCTION: This exam was performed according to the  departmental dose-optimization program which includes automated exposure control, adjustment of the mA and/or kV according to patient size and/or use of iterative reconstruction technique. COMPARISON:  Abdominal MRI 06/09/2023.  CT 09/28/2021. FINDINGS: Lower chest: Lung bases are clear. Coronary artery calcification is visible. Hepatobiliary: Cholelithiasis without CT evidence of cholecystitis or obstruction. No focal liver finding. Pancreas: Redemonstration of a 2.1 cm low-density lesion in the body of the pancreas, evaluated on multiple prior studies. Based on the most recent MRI of earlier today, follow-up recommendations were for repeat MRI in 12 months. See MRI report. Spleen: Normal Adrenals/Urinary Tract: Adrenal glands are normal. Kidneys are normal. Bladder is normal. Some hyperdensity related to previous gadolinium administration. Stomach/Bowel: Stomach and small intestine are normal. Normal appendix. Diverticulosis of the left colon but without evidence of diverticulitis. Vascular/Lymphatic: Aortic atherosclerosis. No aneurysm. IVC is normal. No adenopathy. Reproductive: Normal Other: Previous anterior abdominal wall surgery. Small ventral hernia in the supraumbilical midline containing only fat. Musculoskeletal: Ordinary mild lumbar degenerative changes. IMPRESSION: 1. No acute finding by CT. 2. Cholelithiasis without CT evidence of cholecystitis or obstruction. 3. 2.1 cm low-density  lesion in the body of the pancreas, evaluated on multiple prior studies. Based on the most recent MRI of earlier today, follow-up recommendations were for repeat MRI in 12 months. 4. Diverticulosis of the left colon without evidence of diverticulitis. 5. Aortic atherosclerosis. Coronary artery calcification. 6. Small ventral hernia in the supraumbilical midline containing only fat. Aortic Atherosclerosis (ICD10-I70.0). Electronically Signed   By: Paulina Fusi M.D.   On: 06/09/2023 10:25   MR ABDOMEN MRCP W WO  CONTAST Result Date: 06/09/2023 CLINICAL DATA:  73 year old male with history of right upper quadrant abdominal pain and emesis. EXAM: MRI ABDOMEN WITHOUT AND WITH CONTRAST (INCLUDING MRCP) TECHNIQUE: Multiplanar multisequence MR imaging of the abdomen was performed both before and after the administration of intravenous contrast. Heavily T2-weighted images of the biliary and pancreatic ducts were obtained, and three-dimensional MRCP images were rendered by post processing. CONTRAST:  10mL GADAVIST GADOBUTROL 1 MMOL/ML IV SOLN COMPARISON:  Abdominal MRI 04/06/2022. FINDINGS: Lower chest: Unremarkable. Hepatobiliary: Mild diffuse loss of signal intensity throughout the hepatic parenchyma on out of phase dual echo images, indicative of a background of hepatic steatosis. No suspicious cystic or solid hepatic lesions. No intra or extrahepatic biliary ductal dilatation noted on MRCP images. Common bile duct measures 6 mm in the porta hepatis. No filling defect in the common bile duct to suggest choledocholithiasis. Multiple tiny filling defects are noted in the lumen of the gallbladder, indicative of gallstones. Gallbladder is only mildly distended. Gallbladder wall thickness is normal. No pericholecystic fluid or surrounding inflammatory changes. Pancreas: 2.2 x 1.4 cm predominantly T1 hypointense and T2 hyperintense lesion in the anterior aspect of the pancreas near the junction of pancreatic body and tail (axial image 15 of series 27), stable in size compared to the recent prior study. Multiple tiny internal septations are noted, many of which appear to have low-level internal enhancement. No definite mural nodularity or solid enhancing nodular component. Additionally, this lesion does not restrict diffusion, and does not appear to communicate with the main pancreatic duct. Main pancreatic duct is not dilated. No peripancreatic fluid collections or inflammatory changes. Spleen:  Unremarkable. Adrenals/Urinary Tract:  Bilateral kidneys and bilateral adrenal glands are normal in appearance. No hydroureteronephrosis in the visualized portions of the abdomen. Stomach/Bowel: Visualized portions are unremarkable. Vascular/Lymphatic: No aneurysm identified in the visualized abdominal vasculature. No lymphadenopathy noted in the abdomen. Other: No significant volume of ascites noted in the visualized portions of the peritoneal cavity. Musculoskeletal: No aggressive appearing osseous lesions are noted in the visualized portions of the skeleton. IMPRESSION: 1. 2.2 x 1.4 cm cystic lesion in the pancreas, stable in size compared to the recent prior study. This is favored to represent a benign cystic neoplasm such as a side branch IPMN. Repeat abdominal MRI with and without IV gadolinium with MRCP is recommended in 12 months to ensure continued stability. This recommendation follows ACR consensus guidelines: Management of Incidental Pancreatic Cysts: A White Paper of the ACR Incidental Findings Committee. J Am Coll Radiol 2017;14:911-923. 2. Cholelithiasis without evidence of acute cholecystitis. 3. No choledocholithiasis or findings to suggest biliary obstruction. 4. Hepatic steatosis. Electronically Signed   By: Trudie Reed M.D.   On: 06/09/2023 05:55   MR 3D Recon At Scanner Result Date: 06/09/2023 CLINICAL DATA:  73 year old male with history of right upper quadrant abdominal pain and emesis. EXAM: MRI ABDOMEN WITHOUT AND WITH CONTRAST (INCLUDING MRCP) TECHNIQUE: Multiplanar multisequence MR imaging of the abdomen was performed both before and after the administration of intravenous contrast.  Heavily T2-weighted images of the biliary and pancreatic ducts were obtained, and three-dimensional MRCP images were rendered by post processing. CONTRAST:  10mL GADAVIST GADOBUTROL 1 MMOL/ML IV SOLN COMPARISON:  Abdominal MRI 04/06/2022. FINDINGS: Lower chest: Unremarkable. Hepatobiliary: Mild diffuse loss of signal intensity throughout the  hepatic parenchyma on out of phase dual echo images, indicative of a background of hepatic steatosis. No suspicious cystic or solid hepatic lesions. No intra or extrahepatic biliary ductal dilatation noted on MRCP images. Common bile duct measures 6 mm in the porta hepatis. No filling defect in the common bile duct to suggest choledocholithiasis. Multiple tiny filling defects are noted in the lumen of the gallbladder, indicative of gallstones. Gallbladder is only mildly distended. Gallbladder wall thickness is normal. No pericholecystic fluid or surrounding inflammatory changes. Pancreas: 2.2 x 1.4 cm predominantly T1 hypointense and T2 hyperintense lesion in the anterior aspect of the pancreas near the junction of pancreatic body and tail (axial image 15 of series 27), stable in size compared to the recent prior study. Multiple tiny internal septations are noted, many of which appear to have low-level internal enhancement. No definite mural nodularity or solid enhancing nodular component. Additionally, this lesion does not restrict diffusion, and does not appear to communicate with the main pancreatic duct. Main pancreatic duct is not dilated. No peripancreatic fluid collections or inflammatory changes. Spleen:  Unremarkable. Adrenals/Urinary Tract: Bilateral kidneys and bilateral adrenal glands are normal in appearance. No hydroureteronephrosis in the visualized portions of the abdomen. Stomach/Bowel: Visualized portions are unremarkable. Vascular/Lymphatic: No aneurysm identified in the visualized abdominal vasculature. No lymphadenopathy noted in the abdomen. Other: No significant volume of ascites noted in the visualized portions of the peritoneal cavity. Musculoskeletal: No aggressive appearing osseous lesions are noted in the visualized portions of the skeleton. IMPRESSION: 1. 2.2 x 1.4 cm cystic lesion in the pancreas, stable in size compared to the recent prior study. This is favored to represent a benign  cystic neoplasm such as a side branch IPMN. Repeat abdominal MRI with and without IV gadolinium with MRCP is recommended in 12 months to ensure continued stability. This recommendation follows ACR consensus guidelines: Management of Incidental Pancreatic Cysts: A White Paper of the ACR Incidental Findings Committee. J Am Coll Radiol 2017;14:911-923. 2. Cholelithiasis without evidence of acute cholecystitis. 3. No choledocholithiasis or findings to suggest biliary obstruction. 4. Hepatic steatosis. Electronically Signed   By: Trudie Reed M.D.   On: 06/09/2023 05:55   US Abdomen Limited RUQ (LIVER/GB) Result Date: 06/09/2023 CLINICAL DATA:  Epigastric pain EXAM: ULTRASOUND ABDOMEN LIMITED RIGHT UPPER QUADRANT COMPARISON:  Ultrasound 09/28/2021 and MRI 04/06/2022 FINDINGS: Gallbladder: Multiple gallstones measuring up to 1.1 cm. No pericholecystic fluid or wall thickening visualized. No sonographic Murphy sign noted by sonographer. Common bile duct: Diameter: 6 mm.  No intrahepatic biliary dilation. Liver: No focal lesion identified. Increased parenchymal echogenicity. Portal vein is patent on color Doppler imaging with normal direction of blood flow towards the liver. Other: None. IMPRESSION: 1. Cholelithiasis without evidence of acute cholecystitis. 2. Hepatic steatosis. Electronically Signed   By: Minerva Fester M.D.   On: 06/09/2023 03:15        Scheduled Meds:  carvedilol  12.5 mg Oral BID   clopidogrel  75 mg Oral Daily   enoxaparin (LOVENOX) injection  0.5 mg/kg Subcutaneous Q24H   pantoprazole (PROTONIX) IV  40 mg Intravenous Q12H   rosuvastatin  20 mg Oral Daily   Continuous Infusions:  dextrose 5 % and 0.45 % NaCl  LOS: 1 day     Silvano Bilis, MD [facility]   If 7PM-7AM, please contact night-coverage [email or web address] Password [facility] 06/10/2023, 10:58 AM

## 2023-06-11 DIAGNOSIS — R7989 Other specified abnormal findings of blood chemistry: Secondary | ICD-10-CM | POA: Diagnosis not present

## 2023-06-11 LAB — COMPREHENSIVE METABOLIC PANEL
ALT: 91 U/L — ABNORMAL HIGH (ref 0–44)
AST: 70 U/L — ABNORMAL HIGH (ref 15–41)
Albumin: 3.4 g/dL — ABNORMAL LOW (ref 3.5–5.0)
Alkaline Phosphatase: 108 U/L (ref 38–126)
Anion gap: 9 (ref 5–15)
BUN: 32 mg/dL — ABNORMAL HIGH (ref 8–23)
CO2: 23 mmol/L (ref 22–32)
Calcium: 8.3 mg/dL — ABNORMAL LOW (ref 8.9–10.3)
Chloride: 106 mmol/L (ref 98–111)
Creatinine, Ser: 3.48 mg/dL — ABNORMAL HIGH (ref 0.61–1.24)
GFR, Estimated: 18 mL/min — ABNORMAL LOW (ref >60)
Glucose, Bld: 141 mg/dL — ABNORMAL HIGH (ref 70–99)
Potassium: 4.1 mmol/L (ref 3.5–5.1)
Sodium: 138 mmol/L (ref 135–145)
Total Bilirubin: 1.1 mg/dL (ref 0.0–1.2)
Total Protein: 7.1 g/dL (ref 6.5–8.1)

## 2023-06-11 LAB — GLUCOSE, CAPILLARY
Glucose-Capillary: 137 mg/dL — ABNORMAL HIGH (ref 70–99)
Glucose-Capillary: 208 mg/dL — ABNORMAL HIGH (ref 70–99)
Glucose-Capillary: 248 mg/dL — ABNORMAL HIGH (ref 70–99)
Glucose-Capillary: 143 mg/dL — ABNORMAL HIGH (ref 70–99)

## 2023-06-11 MED ORDER — SODIUM CHLORIDE 0.9 % IV SOLN: INTRAVENOUS | Status: DC

## 2023-06-11 NOTE — Plan of Care (Signed)

## 2023-06-11 NOTE — Progress Notes (Signed)
COVID/ FLU/RSV TEST DONE

## 2023-06-11 NOTE — Plan of Care (Signed)

## 2023-06-11 NOTE — Progress Notes (Signed)
PROGRESS NOTE    James Maxwell  ZOX:[id number] DOB: [birth year] DOA: 06/09/2023 PCP: Sherron Monday, MD  Outpatient Specialists: gi    Brief Narrative:   From admission h and p  James Maxwell is a 73 y.o. male with medical history significant of obesity, CAD status post stenting, GERD, NASH cirrhosis, gout, hypertension, hyperlipidemia, CPAP presenting with abdominal pain, transaminitis, hyperbilirubinemia.  Patient reports approximately 2 days of epigastric as well as right upper quadrant pain.  Has had worsening difficulty with eating. Progressive nausea.  No diarrhea. Noted prior history of Elita [place]one cirrhosis as well as pancreatic cysts status post EGD evaluation in January 2024. Felt to be noncancerous. Denies any ETOH use. Non smoker. No reported heavy tylenol use. Has tried to eat with worsening symptoms.    Assessment & Plan:   Principal Problem:   Abnormal LFTs (liver function tests) Active Problems:   Transaminitis   AKI (acute kidney injury) (HCC)   Hypertension   Coronary atherosclerosis of native coronary artery   Hyperlipidemia   Type 2 diabetes mellitus with vascular disease (HCC)   Chronic heart failure with preserved ejection fraction (HFpEF) (HCC)   Fatty liver   Other cirrhosis of liver (HCC)   # AKI Suspect prerenal from GI illness, ongoing use of diuretics. CT no obstruction. Discussed w/ GI, we do not think this is hepatorenal, so holding on albumin. Cr improving today but still far off from baseline - continue fluids and trend  # Transaminitis # NAFLD Acute hepatitis panel neg, much improved with fluids, CT abdomen nothing acute, so think this most likely hypoperfusion from dehydration. No ascites or varices, synthetic function of liver appears to be preserved. - continue fluids and trend  # Vomiting # Abdominal pain Appears to be non-specific GI illness that has resolved. No diarrhea to test. CT abdomen/pelvis nothing acute. Abd pain resolved   -  monitor  # HTN BP mild elevation - holding home losartan, lasix, spiro - cont home coreg  # CAD Asymptomatic - cont home plavix, coreg - hold home statin given transaminitis  # T2DM Euglycemic - holding home meds - SSI for now  # HFpEF - holding home diuretics given dehydration    DVT prophylaxis: heparin Code Status: full Family Communication: wife updated @ bedside 1/20. No answer when called today, message left  Level of care: Med-Surg Status is: Inpatient Remains inpatient appropriate because: need for IV fluids    Consultants:  GI (signed off)  Procedures: none  Antimicrobials:  none    Subjective: Reports feeling well, tolerated breakfast, no abd pain or vomiting  Objective: Vitals:   06/10/23 1500 06/10/23 1659 06/10/23 1741 06/11/23 0857  BP:  (!) 152/66 (!) 156/78 (!) 152/66  Pulse:  69 66 61  Resp: 16 15 18 17   Temp:  97.6 F (36.4 C) 97.7 F (36.5 C) 98.6 F (37 C)  TempSrc:  Oral Oral Oral  SpO2:  95% 98% 98%  Weight:      Height:        Intake/Output Summary (Last 24 hours) at 06/11/2023 1558 Last data filed at 06/11/2023 1415 Gross per 24 hour  Intake 360 ml  Output --  Net 360 ml   Filed Weights   06/08/23 2340  Weight: 115 kg    Examination:  General exam: Appears calm and comfortable  Respiratory system: Clear to auscultation. Respiratory effort normal. Cardiovascular system: S1 & S2 heard, RRR. No JVD, murmurs, rubs, gallops or clicks. No pedal edema.  Gastrointestinal system: Abdomen is nondistended, soft and nontender. No organomegaly or masses felt. Normal bowel sounds heard. Central nervous system: Alert and oriented. No focal neurological deficits. Extremities: Symmetric 5 x 5 power. Skin: No rashes, lesions or ulcers Psychiatry: Judgement and insight appear normal. Mood & affect appropriate.     Data Reviewed: I have personally reviewed following labs and imaging studies  CBC: Recent Labs  Lab  06/08/23 2346 06/10/23 0416  WBC 9.0 7.4  HGB 13.3 12.1*  HCT 39.4 36.2*  MCV 83.1 85.8  PLT 196 159   Basic Metabolic Panel: Recent Labs  Lab 06/08/23 2346 06/09/23 2302 06/10/23 0416 06/11/23 0453  NA 139 136 137 138  K 4.3 4.4 4.3 4.1  CL 104 103 106 106  CO2 22 21* 22 23  GLUCOSE 103* 191* 157* 141*  BUN 20 29* 31* 32*  CREATININE 2.54* 4.60* 4.51* 3.48*  CALCIUM 8.8* 8.1* 8.0* 8.3*   GFR: Estimated Creatinine Clearance: 24 mL/min (A) (by C-G formula based on SCr of 3.48 mg/dL (H)). Liver Function Tests: Recent Labs  Lab 06/08/23 2346 06/09/23 2302 06/10/23 0416 06/11/23 0453  AST 626* 215* 162* 70*  ALT 257* 151* 130* 91*  ALKPHOS 102 120 104 108  BILITOT 3.6*  3.6* 2.6* 2.2* 1.1  PROT 7.4 6.6 6.4* 7.1  ALBUMIN 3.5 3.3* 3.1* 3.4*   Recent Labs  Lab 06/08/23 2346  LIPASE 32   No results for input(s): "AMMONIA" in the last 168 hours. Coagulation Profile: No results for input(s): "INR", "PROTIME" in the last 168 hours. Cardiac Enzymes: No results for input(s): "CKTOTAL", "CKMB", "CKMBINDEX", "TROPONINI" in the last 168 hours. BNP (last 3 results) No results for input(s): "PROBNP" in the last 8760 hours. HbA1C: No results for input(s): "HGBA1C" in the last 72 hours. CBG: Recent Labs  Lab 06/10/23 1156 06/10/23 1638 06/10/23 2115 06/11/23 0811 06/11/23 1126  GLUCAP 178* 169* 167* 137* 208*   Lipid Profile: No results for input(s): "CHOL", "HDL", "LDLCALC", "TRIG", "CHOLHDL", "LDLDIRECT" in the last 72 hours. Thyroid Function Tests: No results for input(s): "TSH", "T4TOTAL", "FREET4", "T3FREE", "THYROIDAB" in the last 72 hours. Anemia Panel: No results for input(s): "VITAMINB12", "FOLATE", "FERRITIN", "TIBC", "IRON", "RETICCTPCT" in the last 72 hours. Urine analysis:    Component Value Date/Time   COLORURINE AMBER (A) 06/08/2023 2346   APPEARANCEUR HAZY (A) 06/08/2023 2346   LABSPEC 1.011 06/08/2023 2346   PHURINE 5.0 06/08/2023 2346    GLUCOSEU NEGATIVE 06/08/2023 2346   HGBUR SMALL (A) 06/08/2023 2346   BILIRUBINUR NEGATIVE 06/08/2023 2346   BILIRUBINUR neg 02/03/2013 1751   KETONESUR NEGATIVE 06/08/2023 2346   PROTEINUR 30 (A) 06/08/2023 2346   UROBILINOGEN 0.2 03/06/2015 1830   NITRITE NEGATIVE 06/08/2023 2346   LEUKOCYTESUR NEGATIVE 06/08/2023 2346   Sepsis Labs: @LABRCNTIP (procalcitonin:4,lacticidven:4)  ) Recent Results (from the past 240 hours)  Resp panel by RT-PCR (RSV, Flu A&B, Covid) Anterior Nasal Swab     Status: None   Collection Time: 06/08/23 11:46 PM   Specimen: Anterior Nasal Swab  Result Value Ref Range Status   SARS Coronavirus 2 by RT PCR NEGATIVE NEGATIVE Final    Comment: (NOTE) SARS-CoV-2 target nucleic acids are NOT DETECTED.  The SARS-CoV-2 RNA is generally detectable in upper respiratory specimens during the acute phase of infection. The lowest concentration of SARS-CoV-2 viral copies this assay can detect is 138 copies/mL. A negative result does not preclude SARS-Cov-2 infection and should not be used as the sole basis for treatment or other  patient management decisions. A negative result may occur with  improper specimen collection/handling, submission of specimen other than nasopharyngeal swab, presence of viral mutation(s) within the areas targeted by this assay, and inadequate number of viral copies(<138 copies/mL). A negative result must be combined with clinical observations, patient history, and epidemiological information. The expected result is Negative.  Fact Sheet for Patients:  BloggerCourse.com  Fact Sheet for Healthcare Providers:  SeriousBroker.it  This test is no t yet approved or cleared by the [place] FDA and  has been authorized for detection and/or diagnosis of SARS-CoV-2 by FDA under an Emergency Use Authorization (EUA). This EUA will remain  in effect (meaning this test can be used) for the duration  of the COVID-19 declaration under Section 564(b)(1) of the Act, 21 U.S.C.section 360bbb-3(b)(1), unless the authorization is terminated  or revoked sooner.       Influenza A by PCR NEGATIVE NEGATIVE Final   Influenza B by PCR NEGATIVE NEGATIVE Final    Comment: (NOTE) The Xpert Xpress SARS-CoV-2/FLU/RSV plus assay is intended as an aid in the diagnosis of influenza from Nasopharyngeal swab specimens and should not be used as a sole basis for treatment. Nasal washings and aspirates are unacceptable for Xpert Xpress SARS-CoV-2/FLU/RSV testing.  Fact Sheet for Patients: BloggerCourse.com  Fact Sheet for Healthcare Providers: SeriousBroker.it  This test is not yet approved or cleared by the [place] FDA and has been authorized for detection and/or diagnosis of SARS-CoV-2 by FDA under an Emergency Use Authorization (EUA). This EUA will remain in effect (meaning this test can be used) for the duration of the COVID-19 declaration under Section 564(b)(1) of the Act, 21 U.S.C. section 360bbb-3(b)(1), unless the authorization is terminated or revoked.     Resp Syncytial Virus by PCR NEGATIVE NEGATIVE Final    Comment: (NOTE) Fact Sheet for Patients: BloggerCourse.com  Fact Sheet for Healthcare Providers: SeriousBroker.it  This test is not yet approved or cleared by the [place] FDA and has been authorized for detection and/or diagnosis of SARS-CoV-2 by FDA under an Emergency Use Authorization (EUA). This EUA will remain in effect (meaning this test can be used) for the duration of the COVID-19 declaration under Section 564(b)(1) of the Act, 21 U.S.C. section 360bbb-3(b)(1), unless the authorization is terminated or revoked.  Performed at [facility], [address], [place], [place] [address]          Radiology Studies: No results  found.       Scheduled Meds:  carvedilol  12.5 mg Oral BID   clopidogrel  75 mg Oral Daily   heparin  5,000 Units Subcutaneous Q8H   insulin aspart  0-5 Units Subcutaneous QHS   insulin aspart  0-9 Units Subcutaneous TID WC   Continuous Infusions:  sodium chloride       LOS: 2 days     Silvano Bilis, MD [facility]   If 7PM-7AM, please contact night-coverage [email or web address] Password [facility] 06/11/2023, 3:58 PM

## 2023-06-12 DIAGNOSIS — R7989 Other specified abnormal findings of blood chemistry: Secondary | ICD-10-CM | POA: Diagnosis not present

## 2023-06-12 LAB — COMPREHENSIVE METABOLIC PANEL
ALT: 58 U/L — ABNORMAL HIGH (ref 0–44)
AST: 35 U/L (ref 15–41)
Albumin: 3.3 g/dL — ABNORMAL LOW (ref 3.5–5.0)
Alkaline Phosphatase: 96 U/L (ref 38–126)
Anion gap: 8 (ref 5–15)
BUN: 28 mg/dL — ABNORMAL HIGH (ref 8–23)
CO2: 24 mmol/L (ref 22–32)
Calcium: 8.4 mg/dL — ABNORMAL LOW (ref 8.9–10.3)
Chloride: 107 mmol/L (ref 98–111)
Creatinine, Ser: 2.33 mg/dL — ABNORMAL HIGH (ref 0.61–1.24)
GFR, Estimated: 29 mL/min — ABNORMAL LOW (ref >60)
Glucose, Bld: 163 mg/dL — ABNORMAL HIGH (ref 70–99)
Potassium: 4.4 mmol/L (ref 3.5–5.1)
Sodium: 139 mmol/L (ref 135–145)
Total Bilirubin: 1 mg/dL (ref 0.0–1.2)
Total Protein: 6.8 g/dL (ref 6.5–8.1)

## 2023-06-12 LAB — GLUCOSE, CAPILLARY
Glucose-Capillary: 168 mg/dL — ABNORMAL HIGH (ref 70–99)
Glucose-Capillary: 170 mg/dL — ABNORMAL HIGH (ref 70–99)

## 2023-06-12 MED ORDER — METFORMIN HCL 1000 MG PO TABS: 1000.0000 mg | ORAL_TABLET | Freq: Every day | ORAL | Status: DC

## 2023-06-12 NOTE — Plan of Care (Signed)

## 2023-06-12 NOTE — Care Management Important Message (Signed)
Important Message  Patient Details  Name: James Maxwell MRN: [id number] Date of Birth: 09/02/1950   Important Message Given:  Yes - Medicare IM     Cristela Blue, CMA 06/12/2023, 10:43 AM

## 2023-06-12 NOTE — Plan of Care (Signed)
  Problem: Education: Goal: Ability to describe self-care measures that may prevent or decrease complications (Diabetes Survival Skills Education) will improve Outcome: Progressing   Problem: Coping: Goal: Ability to adjust to condition or change in health will improve Outcome: Progressing   Problem: Nutritional: Goal: Maintenance of adequate nutrition will improve Outcome: Progressing   Problem: Nutrition: Goal: Adequate nutrition will be maintained Outcome: Progressing

## 2023-06-12 NOTE — Discharge Summary (Signed)
James Maxwell HQI:[id number] DOB: [birth year] DOA: 06/09/2023  PCP: Sherron Monday, MD  Admit date: 06/09/2023 Discharge date: 06/12/2023  Time spent: 35 minutes  Recommendations for Outpatient Follow-up:  Pcp f/u 1 week, assess kidney function then, also glucose control. Resume long-acting insulin and other meds as appropriate.     Discharge Diagnoses:  Principal Problem:   Abnormal LFTs (liver function tests) Active Problems:   Transaminitis   AKI (acute kidney injury) (HCC)   Hypertension   Coronary atherosclerosis of native coronary artery   Hyperlipidemia   Type 2 diabetes mellitus with vascular disease (HCC)   Chronic heart failure with preserved ejection fraction (HFpEF) (HCC)   Fatty liver   Other cirrhosis of liver (HCC)   Discharge Condition: stable  Diet recommendation: carb modified  Filed Weights   06/08/23 2340  Weight: 115 kg    History of present illness:  From admission h and p James Maxwell is a 73 y.o. male with medical history significant of obesity, CAD status post stenting, GERD, NASH cirrhosis, gout, hypertension, hyperlipidemia, CPAP presenting with abdominal pain, transaminitis, hyperbilirubinemia.  Patient reports approximately 2 days of epigastric as well as right upper quadrant pain.  Has had worsening difficulty with eating. Progressive nausea.  No diarrhea. Noted prior history of Elita Boone cirrhosis as well as pancreatic cysts status post EGD evaluation in January 2024. Felt to be noncancerous. Denies any ETOH use. Non smoker. No reported heavy tylenol use. Has tried to eat with worsening symptoms.     Hospital Course:  Patient presents with abdominal pain and nausea/vomiting. Found to have severe aki and transaminitis. CT abdomen nothing acute, abdominal pain and nausea/vomiting resolved, tolerating PO. LFTs trended down to almost norma fluids and kidney function shows significant steady improvement with fluids so etiology of both appears to be  hypo-perfusion. Home diuretics and losartan likely contributed. GI consulted, did not think this was hepatorenal. In addition to holding those home meds we held patient's long-acting insulin and glucose remained reasonably controlled off that. Kidney function has not returned to baseline, but as patient is tolerating PO, feeling well, as kidney function has showed steady improvement for 3 days, no electrolyte or acid base disturbance, shared decision to proceed with discharge. Will hold home diuretics and losartan and long-acting insulin and will reduce dose of metformin, also holding home potassium, advised push fluids this week, and advised pcp f/u next week. At that visit will need assessment of kidney function and glucose, re-start held meds as kidney function and glucose allows. Will need close monitoring of kidney function if/when those meds are re-started.   Procedures: none   Consultations: GI  Discharge Exam: Vitals:   06/11/23 2345 06/12/23 0810  BP: 133/68 (!) 158/80  Pulse: 64 (!) 59  Resp: 17 17  Temp: 97.9 F (36.6 C) 98.4 F (36.9 C)  SpO2: 97% 95%    General: NAD Cardiovascular: RRR Respiratory: CTAB Abdomen: soft, non-tender  Discharge Instructions   Discharge Instructions     Diet Carb Modified   Complete by: As directed    Increase activity slowly   Complete by: As directed       Allergies as of 06/12/2023       Reactions   Lipitor [atorvastatin]    myalgias        Medication List     STOP taking these medications    amLODipine 5 MG tablet Commonly known as: NORVASC   furosemide 40 MG tablet Commonly known as: LASIX  Jardiance 25 MG Tabs tablet Generic drug: empagliflozin   Klor-Con M20 20 MEQ tablet Generic drug: potassium chloride SA   Lantus SoloStar 100 UNIT/ML Solostar Pen Generic drug: insulin glargine   losartan 100 MG tablet Commonly known as: COZAAR   spironolactone 25 MG tablet Commonly known as: ALDACTONE        TAKE these medications    Accu-Chek Aviva Plus test strip Generic drug: glucose blood Test tid before meals or last test at bedtime   Accu-Chek Softclix Lancets lancets   B-D SINGLE USE SWABS REGULAR Pads   B-D ULTRAFINE III SHORT PEN 31G X 8 MM Misc Generic drug: Insulin Pen Needle SMARTSIG:injection Daily   Breztri Aerosphere 160-9-4.8 MCG/ACT Aero Generic drug: Budeson-Glycopyrrol-Formoterol Inhale 2 puffs into the lungs 2 (two) times daily.   carvedilol 12.5 MG tablet Commonly known as: COREG TAKE 1 TABLET BY MOUTH 2 TIMES DAILY.   clopidogrel 75 MG tablet Commonly known as: PLAVIX TAKE 1 TABLET BY MOUTH EVERY DAY   fluticasone 50 MCG/ACT nasal spray Commonly known as: FLONASE Place 1 spray into both nostrils daily as needed for allergies.   Fluzone High-Dose 0.5 ML injection Generic drug: Influenza vac split quadrivalent PF Inject 0.5 mLs into the muscle once.   FreeStyle Libre 2 Sensor Misc 2 each by Does not apply route every 14 (fourteen) days.   HUMALOG KWIKPEN [place] Inject 3-15 Units into the skin 3 (three) times daily as needed (Low blood glucose). Sliding scale Check 3-4 times a day 150-201= 3 202-250=6 units 251-300=9 units 301-350= 12 units 351-400=15 units What changed: Another medication with the same name was changed. Make sure you understand how and when to take each.   insulin lispro 100 UNIT/ML KwikPen Commonly known as: HumaLOG KwikPen Sliding scale max 30 units/day What changed:  how much to take how to take this when to take this   metFORMIN 1000 MG tablet Commonly known as: GLUCOPHAGE Take 1 tablet (1,000 mg total) by mouth daily with breakfast. What changed: when to take this   montelukast 10 MG tablet Commonly known as: SINGULAIR TAKE 1 TABLET BY MOUTH EVERY DAY IN THE MORNING   nitroGLYCERIN 0.4 MG SL tablet Commonly known as: NITROSTAT Place 1 tablet (0.4 mg total) under the tongue every 5 (five) minutes as needed for chest  pain.   pantoprazole 40 MG tablet Commonly known as: PROTONIX TAKE 1 TABLET BY MOUTH EVERY DAY   primidone 50 MG tablet Commonly known as: MYSOLINE TAKE 1 TABLET BY MOUTH TWICE A DAY   rosuvastatin 20 MG tablet Commonly known as: CRESTOR TAKE 1 TABLET BY MOUTH EVERY DAY   tirzepatide 5 MG/0.5ML injection vial Inject 5 mg into the skin once a week.   tiZANidine 2 MG tablet Commonly known as: ZANAFLEX Take 2 mg by mouth daily as needed for muscle spasms.   Vitamin D3 125 MCG (5000 UT) Caps Take 5,000 Units by mouth daily.   vitamin E 180 MG (400 UNITS) capsule Take 400 Units by mouth daily.   Zinc 50 MG Tabs Take 50 mg by mouth daily.       Allergies  Allergen Reactions   Lipitor [Atorvastatin]     myalgias    Follow-up Information     Sherron Monday, MD Follow up in 1 week(s).   Specialty: Internal Medicine Why: schedule follow up next week Contact information: [address] [place] [address] [phone]  The results of significant diagnostics from this hospitalization (including imaging, microbiology, ancillary and laboratory) are listed below for reference.    Significant Diagnostic Studies: CT ABDOMEN PELVIS WO CONTRAST Result Date: 06/09/2023 CLINICAL DATA:  Abdominal pain, acute, nonlocalized. EXAM: CT ABDOMEN AND PELVIS WITHOUT CONTRAST TECHNIQUE: Multidetector CT imaging of the abdomen and pelvis was performed following the standard protocol without IV contrast. RADIATION DOSE REDUCTION: This exam was performed according to the departmental dose-optimization program which includes automated exposure control, adjustment of the mA and/or kV according to patient size and/or use of iterative reconstruction technique. COMPARISON:  Abdominal MRI 06/09/2023.  CT 09/28/2021. FINDINGS: Lower chest: Lung bases are clear. Coronary artery calcification is visible. Hepatobiliary: Cholelithiasis without CT evidence of cholecystitis or  obstruction. No focal liver finding. Pancreas: Redemonstration of a 2.1 cm low-density lesion in the body of the pancreas, evaluated on multiple prior studies. Based on the most recent MRI of earlier today, follow-up recommendations were for repeat MRI in 12 months. See MRI report. Spleen: Normal Adrenals/Urinary Tract: Adrenal glands are normal. Kidneys are normal. Bladder is normal. Some hyperdensity related to previous gadolinium administration. Stomach/Bowel: Stomach and small intestine are normal. Normal appendix. Diverticulosis of the left colon but without evidence of diverticulitis. Vascular/Lymphatic: Aortic atherosclerosis. No aneurysm. IVC is normal. No adenopathy. Reproductive: Normal Other: Previous anterior abdominal wall surgery. Small ventral hernia in the supraumbilical midline containing only fat. Musculoskeletal: Ordinary mild lumbar degenerative changes. IMPRESSION: 1. No acute finding by CT. 2. Cholelithiasis without CT evidence of cholecystitis or obstruction. 3. 2.1 cm low-density lesion in the body of the pancreas, evaluated on multiple prior studies. Based on the most recent MRI of earlier today, follow-up recommendations were for repeat MRI in 12 months. 4. Diverticulosis of the left colon without evidence of diverticulitis. 5. Aortic atherosclerosis. Coronary artery calcification. 6. Small ventral hernia in the supraumbilical midline containing only fat. Aortic Atherosclerosis (ICD10-I70.0). Electronically Signed   By: Paulina Fusi M.D.   On: 06/09/2023 10:25   MR ABDOMEN MRCP W WO CONTAST Result Date: 06/09/2023 CLINICAL DATA:  73 year old male with history of right upper quadrant abdominal pain and emesis. EXAM: MRI ABDOMEN WITHOUT AND WITH CONTRAST (INCLUDING MRCP) TECHNIQUE: Multiplanar multisequence MR imaging of the abdomen was performed both before and after the administration of intravenous contrast. Heavily T2-weighted images of the biliary and pancreatic ducts were obtained,  and three-dimensional MRCP images were rendered by post processing. CONTRAST:  10mL GADAVIST GADOBUTROL 1 MMOL/ML IV SOLN COMPARISON:  Abdominal MRI 04/06/2022. FINDINGS: Lower chest: Unremarkable. Hepatobiliary: Mild diffuse loss of signal intensity throughout the hepatic parenchyma on out of phase dual echo images, indicative of a background of hepatic steatosis. No suspicious cystic or solid hepatic lesions. No intra or extrahepatic biliary ductal dilatation noted on MRCP images. Common bile duct measures 6 mm in the porta hepatis. No filling defect in the common bile duct to suggest choledocholithiasis. Multiple tiny filling defects are noted in the lumen of the gallbladder, indicative of gallstones. Gallbladder is only mildly distended. Gallbladder wall thickness is normal. No pericholecystic fluid or surrounding inflammatory changes. Pancreas: 2.2 x 1.4 cm predominantly T1 hypointense and T2 hyperintense lesion in the anterior aspect of the pancreas near the junction of pancreatic body and tail (axial image 15 of series 27), stable in size compared to the recent prior study. Multiple tiny internal septations are noted, many of which appear to have low-level internal enhancement. No definite mural nodularity or solid enhancing nodular component. Additionally, this lesion does  not restrict diffusion, and does not appear to communicate with the main pancreatic duct. Main pancreatic duct is not dilated. No peripancreatic fluid collections or inflammatory changes. Spleen:  Unremarkable. Adrenals/Urinary Tract: Bilateral kidneys and bilateral adrenal glands are normal in appearance. No hydroureteronephrosis in the visualized portions of the abdomen. Stomach/Bowel: Visualized portions are unremarkable. Vascular/Lymphatic: No aneurysm identified in the visualized abdominal vasculature. No lymphadenopathy noted in the abdomen. Other: No significant volume of ascites noted in the visualized portions of the peritoneal  cavity. Musculoskeletal: No aggressive appearing osseous lesions are noted in the visualized portions of the skeleton. IMPRESSION: 1. 2.2 x 1.4 cm cystic lesion in the pancreas, stable in size compared to the recent prior study. This is favored to represent a benign cystic neoplasm such as a side branch IPMN. Repeat abdominal MRI with and without IV gadolinium with MRCP is recommended in 12 months to ensure continued stability. This recommendation follows ACR consensus guidelines: Management of Incidental Pancreatic Cysts: A White Paper of the ACR Incidental Findings Committee. J Am Coll Radiol 2017;14:911-923. 2. Cholelithiasis without evidence of acute cholecystitis. 3. No choledocholithiasis or findings to suggest biliary obstruction. 4. Hepatic steatosis. Electronically Signed   By: Trudie Reed M.D.   On: 06/09/2023 05:55   MR 3D Recon At Scanner Result Date: 06/09/2023 CLINICAL DATA:  73 year old male with history of right upper quadrant abdominal pain and emesis. EXAM: MRI ABDOMEN WITHOUT AND WITH CONTRAST (INCLUDING MRCP) TECHNIQUE: Multiplanar multisequence MR imaging of the abdomen was performed both before and after the administration of intravenous contrast. Heavily T2-weighted images of the biliary and pancreatic ducts were obtained, and three-dimensional MRCP images were rendered by post processing. CONTRAST:  10mL GADAVIST GADOBUTROL 1 MMOL/ML IV SOLN COMPARISON:  Abdominal MRI 04/06/2022. FINDINGS: Lower chest: Unremarkable. Hepatobiliary: Mild diffuse loss of signal intensity throughout the hepatic parenchyma on out of phase dual echo images, indicative of a background of hepatic steatosis. No suspicious cystic or solid hepatic lesions. No intra or extrahepatic biliary ductal dilatation noted on MRCP images. Common bile duct measures 6 mm in the porta hepatis. No filling defect in the common bile duct to suggest choledocholithiasis. Multiple tiny filling defects are noted in the lumen of the  gallbladder, indicative of gallstones. Gallbladder is only mildly distended. Gallbladder wall thickness is normal. No pericholecystic fluid or surrounding inflammatory changes. Pancreas: 2.2 x 1.4 cm predominantly T1 hypointense and T2 hyperintense lesion in the anterior aspect of the pancreas near the junction of pancreatic body and tail (axial image 15 of series 27), stable in size compared to the recent prior study. Multiple tiny internal septations are noted, many of which appear to have low-level internal enhancement. No definite mural nodularity or solid enhancing nodular component. Additionally, this lesion does not restrict diffusion, and does not appear to communicate with the main pancreatic duct. Main pancreatic duct is not dilated. No peripancreatic fluid collections or inflammatory changes. Spleen:  Unremarkable. Adrenals/Urinary Tract: Bilateral kidneys and bilateral adrenal glands are normal in appearance. No hydroureteronephrosis in the visualized portions of the abdomen. Stomach/Bowel: Visualized portions are unremarkable. Vascular/Lymphatic: No aneurysm identified in the visualized abdominal vasculature. No lymphadenopathy noted in the abdomen. Other: No significant volume of ascites noted in the visualized portions of the peritoneal cavity. Musculoskeletal: No aggressive appearing osseous lesions are noted in the visualized portions of the skeleton. IMPRESSION: 1. 2.2 x 1.4 cm cystic lesion in the pancreas, stable in size compared to the recent prior study. This is favored to represent a benign  cystic neoplasm such as a side branch IPMN. Repeat abdominal MRI with and without IV gadolinium with MRCP is recommended in 12 months to ensure continued stability. This recommendation follows ACR consensus guidelines: Management of Incidental Pancreatic Cysts: A White Paper of the ACR Incidental Findings Committee. J Am Coll Radiol 2017;14:911-923. 2. Cholelithiasis without evidence of acute cholecystitis.  3. No choledocholithiasis or findings to suggest biliary obstruction. 4. Hepatic steatosis. Electronically Signed   By: Trudie Reed M.D.   On: 06/09/2023 05:55   US Abdomen Limited RUQ (LIVER/GB) Result Date: 06/09/2023 CLINICAL DATA:  Epigastric pain EXAM: ULTRASOUND ABDOMEN LIMITED RIGHT UPPER QUADRANT COMPARISON:  Ultrasound 09/28/2021 and MRI 04/06/2022 FINDINGS: Gallbladder: Multiple gallstones measuring up to 1.1 cm. No pericholecystic fluid or wall thickening visualized. No sonographic Murphy sign noted by sonographer. Common bile duct: Diameter: 6 mm.  No intrahepatic biliary dilation. Liver: No focal lesion identified. Increased parenchymal echogenicity. Portal vein is patent on color Doppler imaging with normal direction of blood flow towards the liver. Other: None. IMPRESSION: 1. Cholelithiasis without evidence of acute cholecystitis. 2. Hepatic steatosis. Electronically Signed   By: Minerva Fester M.D.   On: 06/09/2023 03:15    Microbiology: Recent Results (from the past 240 hours)  Resp panel by RT-PCR (RSV, Flu A&B, Covid) Anterior Nasal Swab     Status: None   Collection Time: 06/08/23 11:46 PM   Specimen: Anterior Nasal Swab  Result Value Ref Range Status   SARS Coronavirus 2 by RT PCR NEGATIVE NEGATIVE Final    Comment: (NOTE) SARS-CoV-2 target nucleic acids are NOT DETECTED.  The SARS-CoV-2 RNA is generally detectable in upper respiratory specimens during the acute phase of infection. The lowest concentration of SARS-CoV-2 viral copies this assay can detect is 138 copies/mL. A negative result does not preclude SARS-Cov-2 infection and should not be used as the sole basis for treatment or other patient management decisions. A negative result may occur with  improper specimen collection/handling, submission of specimen other than nasopharyngeal swab, presence of viral mutation(s) within the areas targeted by this assay, and inadequate number of viral copies(<138  copies/mL). A negative result must be combined with clinical observations, patient history, and epidemiological information. The expected result is Negative.  Fact Sheet for Patients:  BloggerCourse.com  Fact Sheet for Healthcare Providers:  SeriousBroker.it  This test is no t yet approved or cleared by the [place] FDA and  has been authorized for detection and/or diagnosis of SARS-CoV-2 by FDA under an Emergency Use Authorization (EUA). This EUA will remain  in effect (meaning this test can be used) for the duration of the COVID-19 declaration under Section 564(b)(1) of the Act, 21 U.S.C.section 360bbb-3(b)(1), unless the authorization is terminated  or revoked sooner.       Influenza A by PCR NEGATIVE NEGATIVE Final   Influenza B by PCR NEGATIVE NEGATIVE Final    Comment: (NOTE) The Xpert Xpress SARS-CoV-2/FLU/RSV plus assay is intended as an aid in the diagnosis of influenza from Nasopharyngeal swab specimens and should not be used as a sole basis for treatment. Nasal washings and aspirates are unacceptable for Xpert Xpress SARS-CoV-2/FLU/RSV testing.  Fact Sheet for Patients: BloggerCourse.com  Fact Sheet for Healthcare Providers: SeriousBroker.it  This test is not yet approved or cleared by the [place] FDA and has been authorized for detection and/or diagnosis of SARS-CoV-2 by FDA under an Emergency Use Authorization (EUA). This EUA will remain in effect (meaning this test can be used) for the duration of the  COVID-19 declaration under Section 564(b)(1) of the Act, 21 U.S.C. section 360bbb-3(b)(1), unless the authorization is terminated or revoked.     Resp Syncytial Virus by PCR NEGATIVE NEGATIVE Final    Comment: (NOTE) Fact Sheet for Patients: BloggerCourse.com  Fact Sheet for Healthcare  Providers: SeriousBroker.it  This test is not yet approved or cleared by the [place] FDA and has been authorized for detection and/or diagnosis of SARS-CoV-2 by FDA under an Emergency Use Authorization (EUA). This EUA will remain in effect (meaning this test can be used) for the duration of the COVID-19 declaration under Section 564(b)(1) of the Act, 21 U.S.C. section 360bbb-3(b)(1), unless the authorization is terminated or revoked.  Performed at [facility], [address] [place], [place] [address]      Labs: Basic Metabolic Panel: Recent Labs  Lab 06/08/23 2346 06/09/23 2302 06/10/23 0416 06/11/23 0453 06/12/23 0438  NA 139 136 137 138 139  K 4.3 4.4 4.3 4.1 4.4  CL 104 103 106 106 107  CO2 22 21* 22 23 24   GLUCOSE 103* 191* 157* 141* 163*  BUN 20 29* 31* 32* 28*  CREATININE 2.54* 4.60* 4.51* 3.48* 2.33*  CALCIUM 8.8* 8.1* 8.0* 8.3* 8.4*   Liver Function Tests: Recent Labs  Lab 06/08/23 2346 06/09/23 2302 06/10/23 0416 06/11/23 0453 06/12/23 0438  AST 626* 215* 162* 70* 35  ALT 257* 151* 130* 91* 58*  ALKPHOS 102 120 104 108 96  BILITOT 3.6*  3.6* 2.6* 2.2* 1.1 1.0  PROT 7.4 6.6 6.4* 7.1 6.8  ALBUMIN 3.5 3.3* 3.1* 3.4* 3.3*   Recent Labs  Lab 06/08/23 2346  LIPASE 32   No results for input(s): "AMMONIA" in the last 168 hours. CBC: Recent Labs  Lab 06/08/23 2346 06/10/23 0416  WBC 9.0 7.4  HGB 13.3 12.1*  HCT 39.4 36.2*  MCV 83.1 85.8  PLT 196 159   Cardiac Enzymes: No results for input(s): "CKTOTAL", "CKMB", "CKMBINDEX", "TROPONINI" in the last 168 hours. BNP: BNP (last 3 results) No results for input(s): "BNP" in the last 8760 hours.  ProBNP (last 3 results) No results for input(s): "PROBNP" in the last 8760 hours.  CBG: Recent Labs  Lab 06/11/23 0811 06/11/23 1126 06/11/23 1753 06/11/23 2125 06/12/23 0811  GLUCAP 137* 208* 248* 143* 168*       Signed:  Silvano Bilis MD.  [facility] 06/12/2023, 11:28 AM

## 2023-06-14 ENCOUNTER — Telehealth: Payer: Self-pay | Admitting: Pharmacy Technician

## 2023-06-14 ENCOUNTER — Other Ambulatory Visit (HOSPITAL_COMMUNITY): Payer: Self-pay

## 2023-06-14 NOTE — Telephone Encounter (Signed)
06/14/23: Sent message to pharmacist to see if she is wanting to change the mounjaro to zepbound. Pt just got mounjaro on 06/07/23   Tylene Fantasia, [facility]  P Rx Prior Auth Team Caller: Unspecified (2 weeks ago)      Previous Messages    ----- Message from Tylene Fantasia, [facility] sent at 06/14/2023  7:01 AM EST -----  PA request not needed for mounjaro. For additional info see Pharmacy Prior Auth telephone encounter from 05/30/23.  $302.00 - patient has a deductible ----- Message ----- From: Tylene Fantasia, [facility] Sent: 05/28/2023  11:32 AM EST To: Rx Prior Auth Team  ----- Message from Tylene Fantasia, [facility] sent at 05/28/2023 11:32 AM EST ----- Pharmacy states Greggory Keen is not covered. Can you please check what alternative GLP1 are covered? Or Mounjaro just requires PA renewal?  Thank you!  Refused Prescriptions   Name from pharmacy: ZEPBOUND 5 MG/0.5 ML PEN       Will file in chart as: ZEPBOUND 5 MG/0.5ML Pen   Sig: INJECT 5 MG SUBCUTANEOUSLY WEEKLY   Disp: Not specified    Refills: 0   Start: 06/14/2023   Class: Normal   Refused by: Tylene Fantasia, RPH   Refusal reason: Refill not appropriate   Non-formulary

## 2023-06-17 ENCOUNTER — Ambulatory Visit (INDEPENDENT_AMBULATORY_CARE_PROVIDER_SITE_OTHER): Payer: Medicare Other | Admitting: Internal Medicine

## 2023-06-17 ENCOUNTER — Encounter: Payer: Self-pay | Admitting: Internal Medicine

## 2023-06-17 VITALS — BP 102/68 | HR 70 | Temp 99.4°F | Ht 69.0 in | Wt 248.0 lb

## 2023-06-17 DIAGNOSIS — E860 Dehydration: Secondary | ICD-10-CM

## 2023-06-17 DIAGNOSIS — N179 Acute kidney failure, unspecified: Secondary | ICD-10-CM | POA: Diagnosis not present

## 2023-06-17 DIAGNOSIS — Z0130 Encounter for examination of blood pressure without abnormal findings: Secondary | ICD-10-CM

## 2023-06-17 DIAGNOSIS — E1159 Type 2 diabetes mellitus with other circulatory complications: Secondary | ICD-10-CM

## 2023-06-17 LAB — POCT CBG (FASTING - GLUCOSE)-MANUAL ENTRY: Glucose Fasting, POC: 238 mg/dL — AB (ref 70–99)
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
## 2023-06-18 DIAGNOSIS — H40013 Open angle with borderline findings, low risk, bilateral: Secondary | ICD-10-CM | POA: Diagnosis not present

## 2023-06-18 LAB — COMPREHENSIVE METABOLIC PANEL
ALT: 22 [IU]/L (ref 0–44)
AST: 28 [IU]/L (ref 0–40)
Albumin: 4.1 g/dL (ref 3.8–4.8)
Alkaline Phosphatase: 103 [IU]/L (ref 44–121)
BUN/Creatinine Ratio: 13 (ref 10–24)
BUN: 20 mg/dL (ref 8–27)
Bilirubin Total: 0.8 mg/dL (ref 0.0–1.2)
CO2: 19 mmol/L — ABNORMAL LOW (ref 20–29)
Calcium: 9.5 mg/dL (ref 8.6–10.2)
Chloride: 102 mmol/L (ref 96–106)
Creatinine, Ser: 1.49 mg/dL — ABNORMAL HIGH (ref 0.76–1.27)
Globulin, Total: 3 g/dL (ref 1.5–4.5)
Glucose: 209 mg/dL — ABNORMAL HIGH (ref 70–99)
Potassium: 4.3 mmol/L (ref 3.5–5.2)
Sodium: 139 mmol/L (ref 134–144)
Total Protein: 7.1 g/dL (ref 6.0–8.5)
eGFR: 50 mL/min/{1.73_m2} — ABNORMAL LOW (ref >59)

## 2023-06-19 ENCOUNTER — Other Ambulatory Visit: Payer: Self-pay | Admitting: Internal Medicine

## 2023-06-21 ENCOUNTER — Ambulatory Visit: Payer: Medicare Other | Admitting: Internal Medicine

## 2023-06-25 ENCOUNTER — Encounter: Payer: Self-pay | Admitting: Internal Medicine

## 2023-06-25 ENCOUNTER — Ambulatory Visit (INDEPENDENT_AMBULATORY_CARE_PROVIDER_SITE_OTHER): Payer: Medicare Other | Admitting: Internal Medicine

## 2023-06-25 VITALS — BP 130/73 | HR 61 | Temp 98.9°F | Ht 69.0 in | Wt 247.0 lb

## 2023-06-25 DIAGNOSIS — E860 Dehydration: Secondary | ICD-10-CM | POA: Diagnosis not present

## 2023-06-25 DIAGNOSIS — E1159 Type 2 diabetes mellitus with other circulatory complications: Secondary | ICD-10-CM

## 2023-06-25 DIAGNOSIS — E785 Hyperlipidemia, unspecified: Secondary | ICD-10-CM

## 2023-06-25 DIAGNOSIS — E1169 Type 2 diabetes mellitus with other specified complication: Secondary | ICD-10-CM

## 2023-06-25 DIAGNOSIS — N179 Acute kidney failure, unspecified: Secondary | ICD-10-CM | POA: Diagnosis not present

## 2023-06-25 LAB — POCT CBG (FASTING - GLUCOSE)-MANUAL ENTRY: Glucose Fasting, POC: 177 mg/dL — AB (ref 70–99)

## 2023-06-25 MED ORDER — MOUNJARO 7.5 MG/0.5ML ~~LOC~~ SOAJ: 7.5000 mg | SUBCUTANEOUS | 2 refills | Status: DC

## 2023-06-25 MED ORDER — EMPAGLIFLOZIN 25 MG PO TABS: 25.0000 mg | ORAL_TABLET | Freq: Every day | ORAL | 0 refills | Status: DC
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
## 2023-07-08 ENCOUNTER — Other Ambulatory Visit: Payer: Medicare Other

## 2023-07-08 DIAGNOSIS — E1169 Type 2 diabetes mellitus with other specified complication: Secondary | ICD-10-CM | POA: Diagnosis not present

## 2023-07-08 DIAGNOSIS — E785 Hyperlipidemia, unspecified: Secondary | ICD-10-CM | POA: Diagnosis not present

## 2023-07-08 DIAGNOSIS — E1159 Type 2 diabetes mellitus with other circulatory complications: Secondary | ICD-10-CM | POA: Diagnosis not present

## 2023-07-08 DIAGNOSIS — E860 Dehydration: Secondary | ICD-10-CM | POA: Diagnosis not present

## 2023-07-08 DIAGNOSIS — N179 Acute kidney failure, unspecified: Secondary | ICD-10-CM | POA: Diagnosis not present

## 2023-07-09 DIAGNOSIS — H40013 Open angle with borderline findings, low risk, bilateral: Secondary | ICD-10-CM | POA: Diagnosis not present

## 2023-07-09 LAB — COMPREHENSIVE METABOLIC PANEL
ALT: 12 [IU]/L (ref 0–44)
AST: 26 [IU]/L (ref 0–40)
Albumin: 4.1 g/dL (ref 3.8–4.8)
Alkaline Phosphatase: 79 [IU]/L (ref 44–121)
BUN/Creatinine Ratio: 10 (ref 10–24)
BUN: 10 mg/dL (ref 8–27)
Bilirubin Total: 1.1 mg/dL (ref 0.0–1.2)
CO2: 22 mmol/L (ref 20–29)
Calcium: 9.4 mg/dL (ref 8.6–10.2)
Chloride: 104 mmol/L (ref 96–106)
Creatinine, Ser: 1.04 mg/dL (ref 0.76–1.27)
Globulin, Total: 2.8 g/dL (ref 1.5–4.5)
Glucose: 160 mg/dL — ABNORMAL HIGH (ref 70–99)
Potassium: 4.2 mmol/L (ref 3.5–5.2)
Sodium: 142 mmol/L (ref 134–144)
Total Protein: 6.9 g/dL (ref 6.0–8.5)
eGFR: 76 mL/min/{1.73_m2} (ref >59)

## 2023-07-09 LAB — LIPID PANEL
Chol/HDL Ratio: 2.2 {ratio} (ref 0.0–5.0)
Cholesterol, Total: 95 mg/dL — ABNORMAL LOW (ref 100–199)
HDL: 44 mg/dL (ref >39)
LDL Chol Calc (NIH): 32 mg/dL (ref 0–99)
Triglycerides: 97 mg/dL (ref 0–149)
VLDL Cholesterol Cal: 19 mg/dL (ref 5–40)

## 2023-07-09 LAB — HEMOGLOBIN A1C
Est. average glucose Bld gHb Est-mCnc: 174 mg/dL
Hgb A1c MFr Bld: 7.7 % — ABNORMAL HIGH (ref 4.8–5.6)

## 2023-07-10 ENCOUNTER — Ambulatory Visit (INDEPENDENT_AMBULATORY_CARE_PROVIDER_SITE_OTHER): Payer: Medicare Other | Admitting: Internal Medicine

## 2023-07-10 ENCOUNTER — Encounter: Payer: Self-pay | Admitting: Internal Medicine

## 2023-07-10 VITALS — BP 138/80 | HR 74 | Ht 69.0 in | Wt 247.0 lb

## 2023-07-10 DIAGNOSIS — E1159 Type 2 diabetes mellitus with other circulatory complications: Secondary | ICD-10-CM

## 2023-07-10 DIAGNOSIS — Z0130 Encounter for examination of blood pressure without abnormal findings: Secondary | ICD-10-CM

## 2023-07-10 LAB — POCT CBG (FASTING - GLUCOSE)-MANUAL ENTRY: Glucose Fasting, POC: 158 mg/dL — AB (ref 70–99)

## 2023-07-10 MED ORDER — MOUNJARO 10 MG/0.5ML ~~LOC~~ SOAJ: 10.0000 mg | SUBCUTANEOUS | 2 refills | Status: DC

## 2023-07-10 MED ORDER — DAPAGLIFLOZIN PROPANEDIOL 10 MG PO TABS: 10.0000 mg | ORAL_TABLET | Freq: Every day | ORAL | 2 refills | Status: AC

## 2023-07-10 NOTE — Progress Notes (Signed)
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 applies only to samples taken after fasting for at  least 8 hours.  Glucose, capillary     Status: Abnormal   Collection Time: 06/11/23 11:26 AM  Result Value Ref Range   Glucose-Capillary 208 (H) 70 - 99 mg/dL    Comment: Glucose reference range applies only to samples taken after fasting for at least 8 hours.  Glucose, capillary      Status: Abnormal   Collection Time: 06/11/23  5:53 PM  Result Value Ref Range   Glucose-Capillary 248 (H) 70 - 99 mg/dL    Comment: Glucose reference range applies only to samples taken after fasting for at least 8 hours.  Glucose, capillary     Status: Abnormal   Collection Time: 06/11/23  9:25 PM  Result Value Ref Range   Glucose-Capillary 143 (H) 70 - 99 mg/dL    Comment: Glucose reference range applies only to samples taken after fasting for at least 8 hours.  Comprehensive metabolic panel     Status: Abnormal   Collection Time: 06/12/23  4:38 AM  Result Value Ref Range   Sodium 139 135 - 145 mmol/L   Potassium 4.4 3.5 - 5.1 mmol/L   Chloride 107 98 - 111 mmol/L   CO2 24 22 - 32 mmol/L   Glucose, Bld 163 (H) 70 - 99 mg/dL    Comment: Glucose reference range applies only to samples taken after fasting for at least 8 hours.   BUN 28 (H) 8 - 23 mg/dL   Creatinine, Ser 1.91 (H) 0.61 - 1.24 mg/dL   Calcium 8.4 (L) 8.9 - 10.3 mg/dL   Total Protein 6.8 6.5 - 8.1 g/dL   Albumin 3.3 (L) 3.5 - 5.0 g/dL   AST 35 15 - 41 U/L   ALT 58 (H) 0 - 44 U/L   Alkaline Phosphatase 96 38 - 126 U/L   Total Bilirubin 1.0 0.0 - 1.2 mg/dL   GFR, Estimated 29 (L) >60 mL/min    Comment: (NOTE) Calculated using the CKD-EPI Creatinine Equation (2021)    Anion gap 8 5 - 15    Comment: Performed at [facility], [address] [place], [place] [address]  Glucose, capillary     Status: Abnormal   Collection Time: 06/12/23  8:11 AM  Result Value Ref Range   Glucose-Capillary 168 (H) 70 - 99 mg/dL    Comment: Glucose reference range applies only to samples taken after fasting for at least 8 hours.  Glucose, capillary     Status: Abnormal   Collection Time: 06/12/23 12:03 PM  Result Value Ref Range   Glucose-Capillary 170 (H) 70 - 99 mg/dL    Comment: Glucose reference range applies only to samples taken after fasting for at least 8 hours.  POCT CBG (Fasting - Glucose)     Status: Abnormal    Collection Time: 06/17/23 10:20 AM  Result Value Ref Range   Glucose Fasting, POC 238 (A) 70 - 99 mg/dL  Comprehensive metabolic panel     Status: Abnormal   Collection Time: 06/17/23 10:53 AM  Result Value Ref Range   Glucose 209 (H) 70 - 99 mg/dL   BUN 20 8 - 27 mg/dL   Creatinine, Ser 5.62 (H) 0.76 - 1.27 mg/dL   eGFR 50 (L) >13 YQ/MVH/8.46   BUN/Creatinine Ratio 13 10 - 24   Sodium 139 134 - 144 mmol/L   Potassium 4.3 3.5 - 5.2 mmol/L   Chloride 102 96 - 106 mmol/L   CO2 19 (L) 20 - 29 mmol/L   Calcium 9.5 8.6 -  10.2 mg/dL   Total Protein 7.1 6.0 - 8.5 g/dL   Albumin 4.1 3.8 - 4.8 g/dL   Globulin, Total 3.0 1.5 - 4.5 g/dL   Bilirubin Total 0.8 0.0 - 1.2 mg/dL   Alkaline Phosphatase 103 44 - 121 IU/L   AST 28 0 - 40 IU/L   ALT 22 0 - 44 IU/L  POCT CBG (Fasting - Glucose)     Status: Abnormal   Collection Time: 06/25/23 10:50 AM  Result Value Ref Range   Glucose Fasting, POC 177 (A) 70 - 99 mg/dL  Lipid panel     Status: Abnormal   Collection Time: 07/08/23  8:09 AM  Result Value Ref Range   Cholesterol, Total 95 (L) 100 - 199 mg/dL   Triglycerides 97 0 - 149 mg/dL   HDL 44 >19 mg/dL   VLDL Cholesterol Cal 19 5 - 40 mg/dL   LDL Chol Calc (NIH) 32 0 - 99 mg/dL   Chol/HDL Ratio 2.2 0.0 - 5.0 ratio    Comment:                                   T. Chol/HDL Ratio                                             Men  Women                               1/2 Avg.Risk  3.4    3.3                                   Avg.Risk  5.0    4.4                                2X Avg.Risk  9.6    7.1                                3X Avg.Risk 23.4   11.0   Comprehensive metabolic panel     Status: Abnormal   Collection Time: 07/08/23  8:09 AM  Result Value Ref Range   Glucose 160 (H) 70 - 99 mg/dL   BUN 10 8 - 27 mg/dL   Creatinine, Ser 1.47 0.76 - 1.27 mg/dL   eGFR 76 >82 NF/AOZ/3.08   BUN/Creatinine Ratio 10 10 - 24   Sodium 142 134 - 144 mmol/L   Potassium 4.2 3.5 - 5.2 mmol/L    Chloride 104 96 - 106 mmol/L   CO2 22 20 - 29 mmol/L   Calcium 9.4 8.6 - 10.2 mg/dL   Total Protein 6.9 6.0 - 8.5 g/dL   Albumin 4.1 3.8 - 4.8 g/dL   Globulin, Total 2.8 1.5 - 4.5 g/dL   Bilirubin Total 1.1 0.0 - 1.2 mg/dL   Alkaline Phosphatase 79 44 - 121 IU/L   AST 26 0 - 40 IU/L   ALT 12 0 - 44 IU/L  Hemoglobin A1c     Status: Abnormal   Collection Time: 07/08/23  8:09 AM  Result  Value Ref Range   Hgb A1c MFr Bld 7.7 (H) 4.8 - 5.6 %    Comment:          Prediabetes: 5.7 - 6.4          Diabetes: >6.4          Glycemic control for adults with diabetes: <7.0    Est. average glucose Bld gHb Est-mCnc 174 mg/dL  POCT CBG (Fasting - Glucose)     Status: Abnormal   Collection Time: 07/10/23  9:50 AM  Result Value Ref Range   Glucose Fasting, POC 158 (A) 70 - 99 mg/dL      Assessment & Plan:   Problem List Items Addressed This Visit       Cardiovascular and Mediastinum   Type 2 diabetes mellitus with vascular disease (HCC) - Primary   Relevant Medications   dapagliflozin propanediol (FARXIGA) 10 MG TABS tablet   tirzepatide (MOUNJARO) 10 MG/0.5ML Pen   Other Relevant Orders   POCT CBG (Fasting - Glucose) (Completed)    Return in about 10 weeks (around 09/18/2023) for fu with labs prior.   Total time spent: 20 minutes  Luna Fuse, MD  07/10/2023   This document may have been prepared by [facility] Voice Recognition software and as such may include unintentional dictation errors.

## 2023-07-28 NOTE — Progress Notes (Unsigned)
 Cardiology Office Note:  .   Date:  07/28/2023  ID:  James Maxwell, DOB Mar 30, 1951, MRN [id number] PCP: Sherron Monday, MD  [facility] Providers Cardiologist:  Orbie Pyo, MD Electrophysiologist:  Lewayne Bunting, MD {   History of Present Illness: .   James Maxwell is a 73 y.o. male with the below patient profile here for follow-up appointment.  FOCUSED PROBLEM LIST:   Coronary artery disease status post POBA of OM in 1998, bare-metal stenting of mid right coronary artery in 2005, and drug eluting stent placement for ISR in 2018 Type 2 diabetes on insulin Hypertension Hyperlipidemia; intolerant of Lipitor Aortic atherosclerosis on chest CT 2022 CKD stage II BMI 39 Obstructive sleep apnea on CPAP History of AVNRT status post ablation 2020 Diastolic dysfunction   History includes September 2024 patient presented for routine cardiology follow-up.  Was seen in the clinic the previous July was doing relatively well.  Retired for about 14 years.  Does not do much in the day but is able to do most of his activity without issues.  No problems with medications.  Denies any shortness of breath, exertional angina, syncope/presyncope.  Sometimes does have occasional dependent lower extremity edema which resolves with Lasix.  No severe bleeding or bruising episodes.  No signs or symptoms of stroke.  Otherwise without complaints.  Today, he***  ROS: ***  Studies Reviewed: [name]       CARDIAC CATHETERIZATION   CARDIAC CATHETERIZATION 01/27/2019   Narrative  Prox LAD lesion is 50% stenosed.  Prox Cx to Dist Cx lesion is 25% stenosed.  Previously placed Prox RCA to Mid RCA stent (unknown type) is widely patent.  Mid RCA lesion is 50% stenosed.  LV end diastolic pressure is mildly elevated.   1. Moderate nonobstructive CAD. Lesions in the proximal LAD and mid RCA are unchanged from 2018. Stents in the RCA are still patent 2. Elevated LVEDP 23 mm Hg   Plan: recommend  medical management. Will increase lasix to 40 mg daily. Patient is a candidate for DC today.   Findings Coronary Findings Diagnostic  Dominance: Right   Left Main Vessel was injected. Vessel is normal in caliber. Vessel is angiographically normal.   Left Anterior Descending Prox LAD lesion is 50% stenosed.   Left Circumflex Prox Cx to Dist Cx lesion is 25% stenosed.   Right Coronary Artery Previously placed Prox RCA to Mid RCA stent (unknown type) is widely patent. Mid RCA lesion is 50% stenosed. The lesion is eccentric.   Intervention   No interventions have been documented.     CARDIAC CATHETERIZATION   CARDIAC CATHETERIZATION 01/25/2017   Narrative  A STENT SIERRA 3.50 X 28 MM drug eluting stent was successfully placed, and overlaps previously placed stent.  Mid RCA lesion, 75 %stenosed.  Post intervention, there is a 0% residual stenosis.   Conclusion Successful PCI and stent of in-stent restenosis 75% mid RCA Lesion reduced from 75 down to 0% with DES 3.528 mm xience Postdilated with 4.520 mm [place] to 14 atm   Findings Coronary Findings Diagnostic  Dominance: Right   Right Coronary Artery Culprit lesion. The lesion is type B1, located at the major branch, eccentric and irregular. The lesion was previously treated using a drug eluting stent over 2 years ago. Previously placed stent displays no rethrombosis. Previously placed stent displays restenosis. The stenosis was measured by a visual reading. Pressure wire/FFR was not performed on the lesion. IVUS was not performed on the  lesion.   Intervention   Mid RCA lesion Angioplasty Lesion length: 23 mm. Lesion crossed with guidewire using a WIRE G HI TQ BMW 190. Pre-stent angioplasty was performed using a BALLOON TREK RX 3.0X20. Maximum pressure: 12 atm. Inflation time: 30 sec. A STENT SIERRA 3.50 X 28 MM drug eluting stent was successfully placed, and overlaps previously placed stent. Minimum lumen area: 16 mm.  Stent strut is well apposed. Post-stent angioplasty was performed using a BALLOON [place] TREK RX 4.5X20. Maximum pressure: 14 atm. Inflation time: 20 sec. The pre-interventional distal flow is normal (TIMI 3).  The post-interventional distal flow is normal (TIMI 3). The intervention was successful . No complications occurred at this lesion. Pressure wire/FFR was not performed on the lesion . IVUS was not performed on the lesion . There is a 0% residual stenosis post intervention.   STRESS TESTS   MYOCARDIAL PERFUSION IMAGING 08/30/2021   Narrative   Findings are consistent with no ischemia. The study is low risk.   No ST deviation was noted. Rare PVCs throughout study.   LV perfusion is abnormal. Defect 1: There is a small defect with mild reduction in uptake present in the apical inferior and apex location(s) that is fixed. There is normal wall motion in the defect area. Most consistent with artifact caused by diaphragmatic attenuation.   Left ventricular function is abnormal. Global function is mildly reduced. Nuclear stress EF: 53 %. The left ventricular ejection fraction is mildly decreased (45-54%). End diastolic cavity size is normal.   Prior study available for comparison from 01/23/2021. Perfusion defects in the distal anteroseptum and inferolateral walls are no longer visualized.   ECHOCARDIOGRAM   ECHOCARDIOGRAM COMPLETE 08/30/2021   Narrative ECHOCARDIOGRAM REPORT       Patient Name:   James Maxwell Date of Exam: 08/30/2021 Medical Rec #:  [id number]      Height:       69.0 in Accession #:    [id number]     Weight:       269.0 lb Date of Birth:  March 26, 1951       BSA:          2.343 m Patient Age:    70 years       BP:           138/86 mmHg Patient Gender: M              HR:           87 bpm. Exam Location:  [address]   Procedure: 2D Echo, Cardiac Doppler, Color Doppler and Intracardiac Opacification Agent   Indications:    R07.9* Chest pain, unspecified; R06.00 Dyspnea    History:        Patient has prior history of Echocardiogram examinations, most recent 04/05/2020. CHF, CAD and Previous Myocardial Infarction, COPD and Stroke, Signs/Symptoms:Chest Pain; Risk Factors:Hypertension, Dyslipidemia and Diabetes. Sleep apnea. Acute coronary syndrome.   Sonographer:    Cathie Beams RCS Referring Phys: [phone] CAITLIN S WALKER   IMPRESSIONS     1. Left ventricular ejection fraction, by estimation, is 60 to 65%. The left ventricle has normal function. The left ventricle has no regional wall motion abnormalities. There is mild concentric left ventricular hypertrophy. Left ventricular diastolic parameters are consistent with Grade I diastolic dysfunction (impaired relaxation). 2. Right ventricular systolic function is normal. The right ventricular size is normal. 3. The mitral valve is normal in structure. No evidence of mitral valve regurgitation. No evidence of mitral stenosis.  4. The aortic valve is normal in structure. There is mild calcification of the aortic valve. There is mild thickening of the aortic valve. Aortic valve regurgitation is not visualized. Aortic valve sclerosis is present, with no evidence of aortic valve stenosis. 5. The inferior vena cava is normal in size with greater than 50% respiratory variability, suggesting right atrial pressure of 3 mmHg.   Comparison(s): Prior images reviewed side by side. The left ventricular function has improved. The left ventricular wall motion abnormality is improved.   FINDINGS Left Ventricle: Left ventricular ejection fraction, by estimation, is 60 to 65%. The left ventricle has normal function. The left ventricle has no regional wall motion abnormalities. The left ventricular internal cavity size was normal in size. There is mild concentric left ventricular hypertrophy. Left ventricular diastolic parameters are consistent with Grade I diastolic dysfunction (impaired relaxation). Indeterminate filling  pressures.   Right Ventricle: The right ventricular size is normal. No increase in right ventricular wall thickness. Right ventricular systolic function is normal.   Left Atrium: Left atrial size was normal in size.   Right Atrium: Right atrial size was normal in size.   Pericardium: There is no evidence of pericardial effusion.   Mitral Valve: The mitral valve is normal in structure. No evidence of mitral valve regurgitation. No evidence of mitral valve stenosis.   Tricuspid Valve: The tricuspid valve is normal in structure. Tricuspid valve regurgitation is not demonstrated. No evidence of tricuspid stenosis.   Aortic Valve: The aortic valve is normal in structure. There is mild calcification of the aortic valve. There is mild thickening of the aortic valve. Aortic valve regurgitation is not visualized. Aortic valve sclerosis is present, with no evidence of aortic valve stenosis.   Pulmonic Valve: The pulmonic valve was normal in structure. Pulmonic valve regurgitation is not visualized. No evidence of pulmonic stenosis.   Aorta: The aortic root is normal in size and structure.   Venous: The inferior vena cava is normal in size with greater than 50% respiratory variability, suggesting right atrial pressure of 3 mmHg.   IAS/Shunts: No atrial level shunt detected by color flow Doppler.     LEFT VENTRICLE PLAX 2D LVIDd:         4.20 cm   Diastology LVIDs:         3.10 cm   LV e' medial:    5.98 cm/s LV PW:         1.30 cm   LV E/e' medial:  15.0 LV IVS:        1.60 cm   LV e' lateral:   8.27 cm/s LVOT diam:     1.95 cm   LV E/e' lateral: 10.8 LV SV:         59 LV SV Index:   25 LVOT Area:     2.99 cm     RIGHT VENTRICLE RV Basal diam:  3.00 cm RV S prime:     13.60 cm/s TAPSE (M-mode): 2.2 cm   LEFT ATRIUM             Index        RIGHT ATRIUM           Index LA diam:        3.10 cm 1.32 cm/m   RA Area:     12.80 cm LA Vol (A2C):   27.1 ml 11.56 ml/m  RA Volume:   26.70  ml  11.39 ml/m LA Vol (A4C):   26.9 ml 11.48  ml/m LA Biplane Vol: 28.1 ml 11.99 ml/m AORTIC VALVE LVOT Vmax:   97.40 cm/s LVOT Vmean:  66.500 cm/s LVOT VTI:    0.196 m   AORTA Ao Root diam: 3.30 cm Ao Asc diam:  3.10 cm   MITRAL VALVE MV Area (PHT): 3.77 cm     SHUNTS MV Decel Time: 201 msec     Systemic VTI:  0.20 m MV E velocity: 89.60 cm/s   Systemic Diam: 1.95 cm MV A velocity: 128.00 cm/s MV E/A ratio:  0.70   Mihai Croitoru MD Electronically signed by Thurmon Fair MD Signature Date/Time: 08/30/2021/5:22:31 PM       Final    MONITORS   CARDIAC EVENT MONITOR 04/06/2019   Narrative  Normal sinus rhythm.  Short run of atrial tachycardia noted, but no symptoms.   No sustained pathologic arrhythmias.  Continue medical therapy.   Risk Assessment/Calculations:   {Does this patient have ATRIAL FIBRILLATION?:[phone]} No BP recorded.  {Refresh Note OR Click here to enter BP  :1}***       Physical Exam:   VS:  There were no vitals taken for this visit.   Wt Readings from Last 3 Encounters:  07/10/23 247 lb (112 kg)  06/25/23 247 lb (112 kg)  06/17/23 248 lb (112.5 kg)    GEN: Well nourished, well developed in no acute distress NECK: No JVD; No carotid bruits CARDIAC: ***RRR, no murmurs, rubs, gallops RESPIRATORY:  Clear to auscultation without rales, wheezing or rhonchi  ABDOMEN: Soft, non-tender, non-distended EXTREMITIES:  No edema; No deformity   ASSESSMENT AND PLAN: .   CAD Type 2 diabetes mellitus Hypertension Hyperlipidemia Aortic atherosclerosis CKD OSA Diastolic dysfunction with chronic heart failure BMI 39.0-39.9    {Are you ordering a CV Procedure (e.g. stress test, cath, DCCV, TEE, etc)?   Press F2        :[id number]}  Dispo: ***  Signed, Sharlene Dory, PA-C

## 2023-07-29 ENCOUNTER — Ambulatory Visit: Payer: Medicare Other | Attending: Physician Assistant | Admitting: Physician Assistant

## 2023-07-29 ENCOUNTER — Other Ambulatory Visit: Payer: Self-pay

## 2023-07-29 VITALS — BP 146/78 | HR 86 | Ht 69.0 in | Wt 237.2 lb

## 2023-07-29 DIAGNOSIS — I5032 Chronic diastolic (congestive) heart failure: Secondary | ICD-10-CM | POA: Diagnosis not present

## 2023-07-29 DIAGNOSIS — N182 Chronic kidney disease, stage 2 (mild): Secondary | ICD-10-CM | POA: Diagnosis not present

## 2023-07-29 DIAGNOSIS — E1169 Type 2 diabetes mellitus with other specified complication: Secondary | ICD-10-CM

## 2023-07-29 DIAGNOSIS — G4733 Obstructive sleep apnea (adult) (pediatric): Secondary | ICD-10-CM

## 2023-07-29 DIAGNOSIS — I152 Hypertension secondary to endocrine disorders: Secondary | ICD-10-CM

## 2023-07-29 DIAGNOSIS — I25118 Atherosclerotic heart disease of native coronary artery with other forms of angina pectoris: Secondary | ICD-10-CM | POA: Diagnosis not present

## 2023-07-29 DIAGNOSIS — Z794 Long term (current) use of insulin: Secondary | ICD-10-CM | POA: Diagnosis not present

## 2023-07-29 DIAGNOSIS — E785 Hyperlipidemia, unspecified: Secondary | ICD-10-CM

## 2023-07-29 DIAGNOSIS — E1159 Type 2 diabetes mellitus with other circulatory complications: Secondary | ICD-10-CM

## 2023-07-29 DIAGNOSIS — E118 Type 2 diabetes mellitus with unspecified complications: Secondary | ICD-10-CM

## 2023-07-29 DIAGNOSIS — E119 Type 2 diabetes mellitus without complications: Secondary | ICD-10-CM

## 2023-07-29 DIAGNOSIS — I700 Atherosclerosis of aorta: Secondary | ICD-10-CM | POA: Diagnosis not present

## 2023-07-29 DIAGNOSIS — Z6839 Body mass index (BMI) 39.0-39.9, adult: Secondary | ICD-10-CM

## 2023-07-29 MED ORDER — CARVEDILOL 25 MG PO TABS: 25.0000 mg | ORAL_TABLET | Freq: Two times a day (BID) | ORAL | 3 refills | Status: AC

## 2023-07-29 MED ORDER — METFORMIN HCL 1000 MG PO TABS: 1000.0000 mg | ORAL_TABLET | Freq: Every day | ORAL | 0 refills | Status: DC

## 2023-07-29 NOTE — Patient Instructions (Signed)
 Medication Instructions:  Increase Carvedilol to 25 mg twice daily *If you need a refill on your cardiac medications before your next appointment, please call your pharmacy*   Follow-Up: At [facility], you and your health needs are our priority.  As part of our continuing mission to provide you with exceptional heart care, we have created designated Provider Care Teams.  These Care Teams include your primary Cardiologist (physician) and Advanced Practice Providers (APPs -  Physician Assistants and Nurse Practitioners) who all work together to provide you with the care you need, when you need it.  We recommend signing up for the patient portal called "MyChart".  Sign up information is provided on this After Visit Summary.  MyChart is used to connect with patients for Virtual Visits (Telemedicine).  Patients are able to view lab/test results, encounter notes, upcoming appointments, etc.  Non-urgent messages can be sent to your provider as well.   To learn more about what you can do with MyChart, go to [email or web address].    Your next appointment:   6 month(s)  Provider:   Orbie Pyo, MD     Other Instructions Track blood pressure twice daily, one hour after taking medications for 2 weeks. Send [place] the readings via MyChart.    1st Floor: - Lobby - Registration  - Pharmacy  - Lab - Cafe  2nd Floor: - PV Lab - Diagnostic Testing (echo, CT, nuclear med)  3rd Floor: - Vacant  4th Floor: - TCTS (cardiothoracic surgery) - AFib Clinic - Structural Heart Clinic - Vascular Surgery  - Vascular Ultrasound  5th Floor: - HeartCare Cardiology (general and EP) - Clinical Pharmacy for coumadin, hypertension, lipid, weight-loss medications, and med management appointments    Valet parking services will be available as well.

## 2023-08-02 ENCOUNTER — Encounter (INDEPENDENT_AMBULATORY_CARE_PROVIDER_SITE_OTHER): Payer: Medicare HMO | Admitting: Ophthalmology

## 2023-08-02 DIAGNOSIS — H35033 Hypertensive retinopathy, bilateral: Secondary | ICD-10-CM | POA: Diagnosis not present

## 2023-08-02 DIAGNOSIS — H43813 Vitreous degeneration, bilateral: Secondary | ICD-10-CM

## 2023-08-02 DIAGNOSIS — H353112 Nonexudative age-related macular degeneration, right eye, intermediate dry stage: Secondary | ICD-10-CM | POA: Diagnosis not present

## 2023-08-02 DIAGNOSIS — I10 Essential (primary) hypertension: Secondary | ICD-10-CM | POA: Diagnosis not present

## 2023-08-14 DIAGNOSIS — H40013 Open angle with borderline findings, low risk, bilateral: Secondary | ICD-10-CM | POA: Diagnosis not present

## 2023-10-08 ENCOUNTER — Other Ambulatory Visit: Payer: Self-pay | Admitting: Internal Medicine

## 2023-10-13 ENCOUNTER — Other Ambulatory Visit: Payer: Self-pay | Admitting: Internal Medicine

## 2023-10-24 NOTE — Telephone Encounter (Signed)
 A user error has taken place: orders placed in error, not carried out on this patient.

## 2023-10-25 ENCOUNTER — Other Ambulatory Visit: Payer: Self-pay | Admitting: Internal Medicine

## 2023-10-25 DIAGNOSIS — E1159 Type 2 diabetes mellitus with other circulatory complications: Secondary | ICD-10-CM

## 2023-11-08 DIAGNOSIS — H1045 Other chronic allergic conjunctivitis: Secondary | ICD-10-CM | POA: Diagnosis not present

## 2023-11-18 ENCOUNTER — Encounter: Payer: Self-pay | Admitting: Internal Medicine

## 2023-11-18 ENCOUNTER — Ambulatory Visit: Admitting: Internal Medicine

## 2023-11-18 ENCOUNTER — Ambulatory Visit
Admission: RE | Admit: 2023-11-18 | Discharge: 2023-11-18 | Disposition: A | Discharge status: Home / Self Care | Source: Ambulatory Visit | Attending: Internal Medicine | Admitting: Internal Medicine

## 2023-11-18 ENCOUNTER — Ambulatory Visit
Admission: RE | Admit: 2023-11-18 | Discharge: 2023-11-18 | Disposition: A | Discharge status: Home / Self Care | Attending: Internal Medicine | Admitting: Internal Medicine

## 2023-11-18 VITALS — BP 130/79 | HR 68 | Temp 98.8°F | Ht 69.0 in | Wt 226.6 lb

## 2023-11-18 DIAGNOSIS — S73101A Unspecified sprain of right hip, initial encounter: Secondary | ICD-10-CM | POA: Diagnosis not present

## 2023-11-18 DIAGNOSIS — E1159 Type 2 diabetes mellitus with other circulatory complications: Secondary | ICD-10-CM

## 2023-11-18 DIAGNOSIS — M25551 Pain in right hip: Secondary | ICD-10-CM | POA: Diagnosis not present

## 2023-11-18 DIAGNOSIS — M545 Low back pain, unspecified: Secondary | ICD-10-CM | POA: Diagnosis not present

## 2023-11-18 DIAGNOSIS — M160 Bilateral primary osteoarthritis of hip: Secondary | ICD-10-CM | POA: Diagnosis not present

## 2023-11-18 DIAGNOSIS — M47816 Spondylosis without myelopathy or radiculopathy, lumbar region: Secondary | ICD-10-CM | POA: Diagnosis not present

## 2023-11-18 DIAGNOSIS — Z0130 Encounter for examination of blood pressure without abnormal findings: Secondary | ICD-10-CM

## 2023-11-18 LAB — POCT CBG (FASTING - GLUCOSE)-MANUAL ENTRY: Glucose Fasting, POC: 107 mg/dL — AB (ref 70–99)

## 2023-11-18 MED ORDER — CELECOXIB 200 MG PO CAPS: 200.0000 mg | ORAL_CAPSULE | Freq: Two times a day (BID) | ORAL | 0 refills | Status: DC

## 2023-11-18 MED ORDER — CYCLOBENZAPRINE HCL 10 MG PO TABS: 10.0000 mg | ORAL_TABLET | Freq: Three times a day (TID) | ORAL | 0 refills | Status: DC | PRN
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
## 2023-11-24 ENCOUNTER — Other Ambulatory Visit: Payer: Self-pay | Admitting: Internal Medicine

## 2023-11-25 ENCOUNTER — Ambulatory Visit: Payer: Self-pay | Admitting: Internal Medicine

## 2023-12-02 ENCOUNTER — Ambulatory Visit (INDEPENDENT_AMBULATORY_CARE_PROVIDER_SITE_OTHER): Admitting: Internal Medicine

## 2023-12-02 VITALS — BP 134/72 | HR 70 | Ht 69.0 in | Wt 227.6 lb

## 2023-12-02 DIAGNOSIS — M47816 Spondylosis without myelopathy or radiculopathy, lumbar region: Secondary | ICD-10-CM | POA: Insufficient documentation

## 2023-12-02 DIAGNOSIS — E1159 Type 2 diabetes mellitus with other circulatory complications: Secondary | ICD-10-CM

## 2023-12-02 DIAGNOSIS — S73101D Unspecified sprain of right hip, subsequent encounter: Secondary | ICD-10-CM | POA: Diagnosis not present

## 2023-12-02 DIAGNOSIS — S73101A Unspecified sprain of right hip, initial encounter: Secondary | ICD-10-CM

## 2023-12-02 DIAGNOSIS — E1169 Type 2 diabetes mellitus with other specified complication: Secondary | ICD-10-CM

## 2023-12-02 DIAGNOSIS — E785 Hyperlipidemia, unspecified: Secondary | ICD-10-CM | POA: Diagnosis not present

## 2023-12-02 LAB — POCT CBG (FASTING - GLUCOSE)-MANUAL ENTRY: Glucose Fasting, POC: 146 mg/dL — AB (ref 70–99)

## 2023-12-02 MED ORDER — CYCLOBENZAPRINE HCL 10 MG PO TABS: 10.0000 mg | ORAL_TABLET | Freq: Three times a day (TID) | ORAL | 0 refills | Status: AC | PRN

## 2023-12-02 MED ORDER — CELECOXIB 200 MG PO CAPS: 200.0000 mg | ORAL_CAPSULE | Freq: Two times a day (BID) | ORAL | 0 refills | Status: DC
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
## 2023-12-10 ENCOUNTER — Ambulatory Visit (INDEPENDENT_AMBULATORY_CARE_PROVIDER_SITE_OTHER): Admitting: Internal Medicine

## 2023-12-10 ENCOUNTER — Ambulatory Visit: Payer: Self-pay | Admitting: Internal Medicine

## 2023-12-10 ENCOUNTER — Encounter: Payer: Self-pay | Admitting: Internal Medicine

## 2023-12-10 DIAGNOSIS — M545 Low back pain, unspecified: Secondary | ICD-10-CM

## 2023-12-10 LAB — POCT URINALYSIS DIPSTICK
Bilirubin, UA: NEGATIVE
Blood, UA: NEGATIVE
Glucose, UA: NEGATIVE
Ketones, UA: NEGATIVE
Leukocytes, UA: NEGATIVE
Nitrite, UA: NEGATIVE
Protein, UA: POSITIVE — AB
Spec Grav, UA: 1.025 (ref 1.010–1.025)
Urobilinogen, UA: 0.2 U/dL
pH, UA: 6 (ref 5.0–8.0)

## 2023-12-10 NOTE — Progress Notes (Signed)
 Urine negative except for protein

## 2023-12-11 ENCOUNTER — Other Ambulatory Visit: Payer: Self-pay | Admitting: Internal Medicine

## 2023-12-11 ENCOUNTER — Ambulatory Visit

## 2023-12-11 DIAGNOSIS — M545 Low back pain, unspecified: Secondary | ICD-10-CM

## 2023-12-11 DIAGNOSIS — R319 Hematuria, unspecified: Secondary | ICD-10-CM | POA: Diagnosis not present

## 2023-12-11 LAB — POCT URINALYSIS DIPSTICK
Bilirubin, UA: NEGATIVE
Blood, UA: NEGATIVE
Glucose, UA: NEGATIVE
Ketones, UA: NEGATIVE
Leukocytes, UA: NEGATIVE
Nitrite, UA: NEGATIVE
Protein, UA: NEGATIVE
Spec Grav, UA: 1.025 (ref 1.010–1.025)
Urobilinogen, UA: 0.2 U/dL
pH, UA: 6 (ref 5.0–8.0)

## 2023-12-12 LAB — URINALYSIS, ROUTINE W REFLEX MICROSCOPIC
Bilirubin, UA: NEGATIVE
Glucose, UA: NEGATIVE
Ketones, UA: NEGATIVE
Leukocytes,UA: NEGATIVE
Nitrite, UA: NEGATIVE
RBC, UA: NEGATIVE
Specific Gravity, UA: 1.022 (ref 1.005–1.030)
Urobilinogen, Ur: 0.2 mg/dL (ref 0.2–1.0)
pH, UA: 5.5 (ref 5.0–7.5)

## 2023-12-13 ENCOUNTER — Ambulatory Visit: Payer: Self-pay | Admitting: Internal Medicine

## 2023-12-13 LAB — URINE CULTURE

## 2023-12-20 ENCOUNTER — Other Ambulatory Visit: Payer: Self-pay | Admitting: Internal Medicine

## 2023-12-20 DIAGNOSIS — J301 Allergic rhinitis due to pollen: Secondary | ICD-10-CM

## 2023-12-27 DIAGNOSIS — H40013 Open angle with borderline findings, low risk, bilateral: Secondary | ICD-10-CM | POA: Diagnosis not present

## 2023-12-31 ENCOUNTER — Other Ambulatory Visit

## 2023-12-31 DIAGNOSIS — E1159 Type 2 diabetes mellitus with other circulatory complications: Secondary | ICD-10-CM | POA: Diagnosis not present

## 2023-12-31 DIAGNOSIS — E785 Hyperlipidemia, unspecified: Secondary | ICD-10-CM | POA: Diagnosis not present

## 2023-12-31 DIAGNOSIS — E1169 Type 2 diabetes mellitus with other specified complication: Secondary | ICD-10-CM | POA: Diagnosis not present

## 2024-01-01 LAB — LIPID PANEL
Chol/HDL Ratio: 2.1 ratio (ref 0.0–5.0)
Cholesterol, Total: 105 mg/dL (ref 100–199)
HDL: 50 mg/dL (ref >39)
LDL Chol Calc (NIH): 34 mg/dL (ref 0–99)
Triglycerides: 119 mg/dL (ref 0–149)
VLDL Cholesterol Cal: 21 mg/dL (ref 5–40)

## 2024-01-01 LAB — COMPREHENSIVE METABOLIC PANEL WITH GFR
ALT: 17 IU/L (ref 0–44)
AST: 29 IU/L (ref 0–40)
Albumin: 4.1 g/dL (ref 3.8–4.8)
Alkaline Phosphatase: 70 IU/L (ref 44–121)
BUN/Creatinine Ratio: 11 (ref 10–24)
BUN: 13 mg/dL (ref 8–27)
Bilirubin Total: 1 mg/dL (ref 0.0–1.2)
CO2: 19 mmol/L — ABNORMAL LOW (ref 20–29)
Calcium: 9.7 mg/dL (ref 8.6–10.2)
Chloride: 99 mmol/L (ref 96–106)
Creatinine, Ser: 1.17 mg/dL (ref 0.76–1.27)
Globulin, Total: 3.1 g/dL (ref 1.5–4.5)
Glucose: 206 mg/dL — ABNORMAL HIGH (ref 70–99)
Potassium: 4.6 mmol/L (ref 3.5–5.2)
Sodium: 136 mmol/L (ref 134–144)
Total Protein: 7.2 g/dL (ref 6.0–8.5)
eGFR: 66 mL/min/1.73 (ref >59)

## 2024-01-01 LAB — HEMOGLOBIN A1C
Est. average glucose Bld gHb Est-mCnc: 174 mg/dL
Hgb A1c MFr Bld: 7.7 % — ABNORMAL HIGH (ref 4.8–5.6)

## 2024-01-03 ENCOUNTER — Ambulatory Visit: Payer: Self-pay | Admitting: Internal Medicine

## 2024-01-03 ENCOUNTER — Ambulatory Visit (INDEPENDENT_AMBULATORY_CARE_PROVIDER_SITE_OTHER): Admitting: Internal Medicine

## 2024-01-03 VITALS — BP 136/74 | HR 78 | Temp 97.3°F | Ht 69.0 in | Wt 219.0 lb

## 2024-01-03 DIAGNOSIS — I10 Essential (primary) hypertension: Secondary | ICD-10-CM

## 2024-01-03 DIAGNOSIS — E1159 Type 2 diabetes mellitus with other circulatory complications: Secondary | ICD-10-CM

## 2024-01-03 DIAGNOSIS — E119 Type 2 diabetes mellitus without complications: Secondary | ICD-10-CM

## 2024-01-03 DIAGNOSIS — M545 Low back pain, unspecified: Secondary | ICD-10-CM

## 2024-01-03 DIAGNOSIS — S73101D Unspecified sprain of right hip, subsequent encounter: Secondary | ICD-10-CM

## 2024-01-03 DIAGNOSIS — E1129 Type 2 diabetes mellitus with other diabetic kidney complication: Secondary | ICD-10-CM | POA: Diagnosis not present

## 2024-01-03 DIAGNOSIS — R809 Proteinuria, unspecified: Secondary | ICD-10-CM

## 2024-01-03 LAB — POCT CBG (FASTING - GLUCOSE)-MANUAL ENTRY: Glucose Fasting, POC: 237 mg/dL — AB (ref 70–99)

## 2024-01-03 LAB — POC CREATINE & ALBUMIN,URINE
Albumin/Creatinine Ratio, Urine, POC: >300
Creatinine, POC: 10 mg/dL
Microalbumin Ur, POC: 150 mg/L

## 2024-01-03 MED ORDER — METFORMIN HCL 1000 MG PO TABS: 1000.0000 mg | ORAL_TABLET | Freq: Every day | ORAL | 0 refills | Status: DC

## 2024-01-03 MED ORDER — FREESTYLE LIBRE 3 READER DEVI: 1.0000 | Freq: Every day | 0 refills | Status: AC

## 2024-01-03 MED ORDER — FREESTYLE LIBRE 3 PLUS SENSOR MISC: 11 refills | Status: AC

## 2024-01-03 MED ORDER — EMPAGLIFLOZIN 10 MG PO TABS: 10.0000 mg | ORAL_TABLET | Freq: Every day | ORAL | 0 refills | Status: DC

## 2024-01-03 MED ORDER — CELECOXIB 200 MG PO CAPS: 200.0000 mg | ORAL_CAPSULE | Freq: Two times a day (BID) | ORAL | 2 refills | Status: DC

## 2024-01-03 MED ORDER — MOUNJARO 12.5 MG/0.5ML ~~LOC~~ SOAJ: 12.5000 mg | SUBCUTANEOUS | 2 refills | Status: DC
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
## 2024-01-07 ENCOUNTER — Telehealth: Payer: Self-pay | Admitting: Internal Medicine

## 2024-01-07 NOTE — Telephone Encounter (Signed)
 PT called wanting a referral to a back doctor, wants to stay around town if possible. Having sharp pains normally are in lower back, when it happens it sometimes takes him off his feet

## 2024-01-08 ENCOUNTER — Emergency Department

## 2024-01-08 ENCOUNTER — Other Ambulatory Visit: Payer: Self-pay

## 2024-01-08 ENCOUNTER — Encounter: Payer: Self-pay | Admitting: Emergency Medicine

## 2024-01-08 ENCOUNTER — Emergency Department
Admission: EM | Admit: 2024-01-08 | Discharge: 2024-01-08 | Disposition: A | Discharge status: Home / Self Care | Attending: Emergency Medicine | Admitting: Emergency Medicine

## 2024-01-08 DIAGNOSIS — M4807 Spinal stenosis, lumbosacral region: Secondary | ICD-10-CM | POA: Diagnosis not present

## 2024-01-08 DIAGNOSIS — M545 Low back pain, unspecified: Secondary | ICD-10-CM | POA: Diagnosis not present

## 2024-01-08 DIAGNOSIS — M5459 Other low back pain: Secondary | ICD-10-CM | POA: Diagnosis not present

## 2024-01-08 DIAGNOSIS — I110 Hypertensive heart disease with heart failure: Secondary | ICD-10-CM | POA: Diagnosis not present

## 2024-01-08 DIAGNOSIS — I509 Heart failure, unspecified: Secondary | ICD-10-CM | POA: Diagnosis not present

## 2024-01-08 DIAGNOSIS — I2510 Atherosclerotic heart disease of native coronary artery without angina pectoris: Secondary | ICD-10-CM | POA: Diagnosis not present

## 2024-01-08 DIAGNOSIS — E119 Type 2 diabetes mellitus without complications: Secondary | ICD-10-CM | POA: Insufficient documentation

## 2024-01-08 DIAGNOSIS — M4726 Other spondylosis with radiculopathy, lumbar region: Secondary | ICD-10-CM | POA: Diagnosis not present

## 2024-01-08 DIAGNOSIS — M5116 Intervertebral disc disorders with radiculopathy, lumbar region: Secondary | ICD-10-CM | POA: Diagnosis not present

## 2024-01-08 DIAGNOSIS — M48061 Spinal stenosis, lumbar region without neurogenic claudication: Secondary | ICD-10-CM | POA: Diagnosis not present

## 2024-01-08 MED ORDER — ACETAMINOPHEN 500 MG PO TABS
500.0000 mg | ORAL_TABLET | Freq: Once | ORAL | Status: AC
Start: 2024-01-08 — End: 2024-01-08
  Administered 2024-01-08: 500 mg via ORAL
  Filled 2024-01-08: qty 1

## 2024-01-08 MED ORDER — MORPHINE SULFATE (PF) 4 MG/ML IV SOLN
5.0000 mg | Freq: Once | INTRAVENOUS | Status: AC
  Administered 2024-01-08: 5 mg via INTRAMUSCULAR
  Filled 2024-01-08: qty 2

## 2024-01-08 MED ORDER — LIDOCAINE 5 % EX PTCH
1.0000 | MEDICATED_PATCH | Freq: Two times a day (BID) | CUTANEOUS | 0 refills | Status: AC
Start: 2024-01-08 — End: 2024-01-13

## 2024-01-08 MED ORDER — LIDOCAINE 5 % EX PTCH
1.0000 | MEDICATED_PATCH | CUTANEOUS | Status: DC
  Administered 2024-01-08: 1 via TRANSDERMAL
  Filled 2024-01-08: qty 1

## 2024-01-08 MED ORDER — NAPROXEN 500 MG PO TABS: 500.0000 mg | ORAL_TABLET | Freq: Two times a day (BID) | ORAL | 0 refills | Status: AC

## 2024-01-08 MED ORDER — OXYCODONE HCL 5 MG PO TABS: 5.0000 mg | ORAL_TABLET | Freq: Three times a day (TID) | ORAL | 0 refills | Status: DC | PRN

## 2024-01-08 MED ORDER — KETOROLAC TROMETHAMINE 15 MG/ML IJ SOLN
15.0000 mg | Freq: Once | INTRAMUSCULAR | Status: AC
  Administered 2024-01-08: 15 mg via INTRAMUSCULAR
  Filled 2024-01-08: qty 1

## 2024-01-08 NOTE — ED Provider Notes (Signed)
 [facility] Provider Note    None    (approximate)   History   Back Pain   HPI  James Maxwell is a 73 y.o. 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  Patient reports that he bent over to pick up a piece of paper 2 to 3 days ago and felt a sharp pain across his low back.  He reports that he has had chronic back pain for several months for which he is planning to start PT in 1 month.  He reports that certain movements make his pain worse and feel a sharp pain radiating into his right hip.  He has not taken anything for his symptoms.  He denies leg weakness or paresthesias.  No numbness.  No urinary or fecal incontinence or retention.  No saddle anesthesia.  Patient Active Problem List   Diagnosis Date Noted   Hip sprain, right, subsequent encounter 12/02/2023   Spondylosis of lumbar region without myelopathy or radiculopathy 12/02/2023   Abnormal LFTs (liver function tests) 06/09/2023   Transaminitis 06/09/2023   AKI (acute kidney injury) (HCC) 06/09/2023   Other cirrhosis of liver (HCC)    Family history of malignant neoplasm of gastrointestinal tract 02/05/2022   Fatty liver 02/05/2022   History of colonic polyps 02/05/2022   Screening for malignant neoplasm of colon 02/05/2022   ACS (acute coronary syndrome) (HCC) 01/25/2019   Type 2 diabetes mellitus with vascular disease (HCC) 01/24/2019   Chronic heart failure with preserved ejection fraction (HFpEF) (HCC)    Sepsis (HCC) 01/04/2018   Flexor tenosynovitis of finger 08/09/2017   Hyperlipidemia due to type 2 diabetes mellitus (HCC) 04/25/2017   Unstable angina (HCC) 01/25/2017   Chest pain 05/17/2016   V tach (HCC) 05/17/2016   SVT (supraventricular tachycardia) (HCC) 04/13/2016   Elevated troponin 04/13/2016   Chest pain, rule  out acute myocardial infarction 04/12/2016   Hyperlipidemia 06/03/2015   GERD (gastroesophageal reflux disease) 07/21/2013   OA (osteoarthritis) of knee 07/20/2013   Coronary atherosclerosis of native coronary artery 05/01/2013   Hypertension    CHF (congestive heart failure) (HCC)    Diabetes mellitus without complication (HCC)    Arthritis    MI, old    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HCC) 06/23/2012   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) 10/18/2011          Physical Exam   Triage Vital Signs: ED Triage Vitals  Encounter Vitals Group     BP 01/08/24 0644 136/79     Girls Systolic BP Percentile --      Girls Diastolic BP Percentile --      Boys Systolic BP Percentile --      Boys Diastolic BP Percentile --      Pulse Rate 01/08/24 0644 76     Resp 01/08/24 0644 20     Temp 01/08/24 0644 97.9 F (36.6 C)     Temp Source 01/08/24 0644 Oral     SpO2 01/08/24 0644 95 %     Weight 01/08/24 0642 216 lb (98 kg)     Height 01/08/24 0642 5' 9 (1.753 m)     Head Circumference --      Peak Flow --      Pain Score 01/08/24 0642 10     Pain Loc --      Pain  Education --      Exclude from Growth Chart --     Most recent vital signs: Vitals:   01/08/24 0930 01/08/24 1334  BP: 135/77 112/62  Pulse: 74 74  Resp: 18 18  Temp:    SpO2: 100% 100%    Physical Exam Vitals and nursing note reviewed.  Constitutional:      General: Awake and alert. No acute distress.    Appearance: Normal appearance.  HENT:     Head: Normocephalic and atraumatic.     Mouth: Mucous membranes are moist.  Eyes:     General: PERRL. Normal EOMs        Right eye: No discharge.        Left eye: No discharge.     Conjunctiva/sclera: Conjunctivae normal.  Cardiovascular:     Rate and Rhythm: Normal rate and regular rhythm.     Pulses: Normal pulses.  Pulmonary:     Effort: Pulmonary effort is normal. No respiratory distress.     Breath sounds: Normal breath sounds.   Abdominal:     Abdomen is soft. There is no abdominal tenderness. No rebound or guarding. No distention. Musculoskeletal:        General: No swelling. Normal range of motion.     Cervical back: Normal range of motion and neck supple.  Back: Sitting in a wheelchair as he does not wish to move to stretcher.  Midline lumbar tenderness present, no thoracic or cervical spine tenderness. Strength and sensation intact and equal to bilateral lower extremities.  Able to internally and externally rotate bilateral hips against resistance.  Normal great toe extension against resistance. Normal sensation throughout feet.  Pain with attempted SLR and opposite SLR bilaterally.  Skin:    General: Skin is warm and dry.     Capillary Refill: Capillary refill takes less than 2 seconds.     Findings: No rash.  Neurological:     Mental Status: The patient is awake and alert.      ED Results / Procedures / Treatments   Labs (all labs ordered are listed, but only abnormal results are displayed) Labs Reviewed - No data to display   EKG     RADIOLOGY I independently reviewed and interpreted imaging and agree with radiologists findings.     PROCEDURES:  Critical Care performed:   Procedures   MEDICATIONS ORDERED IN ED: Medications  lidocaine  (LIDODERM ) 5 % 1 patch (1 patch Transdermal Patch Applied 01/08/24 0729)  ketorolac  (TORADOL ) 15 MG/ML injection 15 mg (15 mg Intramuscular Given 01/08/24 0728)  acetaminophen  (TYLENOL ) tablet 500 mg (500 mg Oral Given 01/08/24 0729)  morphine  (PF) 4 MG/ML injection 5 mg (5 mg Intramuscular Given 01/08/24 0923)     IMPRESSION / MDM / ASSESSMENT AND PLAN / ED COURSE  I reviewed the triage vital signs and the nursing notes.   Differential diagnosis includes, but is not limited to, lumbar radiculopathy, cord compression, vertebral fracture, muscle spasm.  Patient is awake and alert, hemodynamically stable and afebrile.  He has pain with any attempted  movement of his back, and he has midline lumbar tenderness.  Started with CT scan of his lumbar spine which reveals no acute fracture or malalignment of the lumbar spine, though he does have multilevel degenerative disc disease with moderate spinal canal narrowing at L2-L3 mild to moderate stenosis from L3-L4 through L5-S1, most severe on the right at L3-L4.  Given that his legs reportedly give out, I will obtain MRI for evaluation of  cord compression.  No urinary or fecal incontinence or retention or saddle anesthesia.  MRI reveals no cord abnormality.  He has spinal canal stenosis at L2-L3, moderate right and mild left foraminal stenosis at L3-L4, and mild bilateral foraminal stenosis at L4-L5 with mild foraminal stenosis on the left at L5-S1.  Recommended follow-up with spine surgery and PT as he already has scheduled.  He was prescribed a small amount of oxycodone  given that he had adequate pain relief with the morphine .  He was advised that this can make him more unsteady and I advised that he take this only with somebody else present with him.  Also advised that he cannot drive, operate heavy machinery, or perform any test that require concentration while taking this medication.  Also advised that this medication is highly addictive and should only be used for breakthrough pain.  He was also given paroxetine given the Toradol  provided some pain relief, though advised that he cannot take this with other NSAIDs.  We discussed very strict return precautions and the importance of close outpatient follow-up.  Patient understands and agrees with plan for discharge in stable condition.  Patient's presentation is most consistent with acute presentation with potential threat to life or bodily function.   Clinical Course as of 01/08/24 1338  Wed Jan 08, 2024  0910 Patient continues to have significant pain, will obtain MRI given findings on CT scan [JP]    Clinical Course User Index [JP] Prudy Candy E, PA-C      FINAL CLINICAL IMPRESSION(S) / ED DIAGNOSES   Final diagnoses:  Acute bilateral low back pain without sciatica     Rx / DC Orders   ED Discharge Orders          Ordered    naproxen  (NAPROSYN ) 500 MG tablet  2 times daily with meals        01/08/24 1307    oxyCODONE  (ROXICODONE ) 5 MG immediate release tablet  Every 8 hours PRN        01/08/24 1307    lidocaine  (LIDODERM ) 5 %  Every 12 hours        01/08/24 1307             Note:  This document was prepared using Dragon voice recognition software and may include unintentional dictation errors.   Gwynne Kemnitz E, PA-C 01/08/24 1338    Ernest Ronal BRAVO, MD 01/08/24 [phone]

## 2024-01-08 NOTE — ED Notes (Signed)
 See triage notes. Pt advised he took a muscle relaxer at 0300 this morning without relief. He was told he has arthritis but believes it's something different. Pt unable to comfortably stand from wheelchair and transfer to bed. Pt remained seated in wheelchair at his request.

## 2024-01-08 NOTE — ED Triage Notes (Signed)
 Pt to triage via w/c with no distress noted; reports lower back pain radiating into hips after reaching down to pick up a piece of paper yesterday

## 2024-01-08 NOTE — Discharge Instructions (Signed)
 Your MRI today shows degenerative disc disease which is likely the etiology of your pain today.  Please follow-up with spine surgery.  Please return for any new, worsening, or change in symptoms or other concerns.  It was a pleasure caring for you today.

## 2024-01-20 ENCOUNTER — Other Ambulatory Visit: Payer: Self-pay | Admitting: Internal Medicine

## 2024-01-23 DIAGNOSIS — M545 Low back pain, unspecified: Secondary | ICD-10-CM | POA: Diagnosis not present

## 2024-01-25 ENCOUNTER — Other Ambulatory Visit: Payer: Self-pay | Admitting: Internal Medicine

## 2024-01-27 DIAGNOSIS — M545 Low back pain, unspecified: Secondary | ICD-10-CM | POA: Diagnosis not present

## 2024-01-27 DIAGNOSIS — G8929 Other chronic pain: Secondary | ICD-10-CM | POA: Diagnosis not present

## 2024-02-02 ENCOUNTER — Other Ambulatory Visit: Payer: Self-pay

## 2024-02-02 DIAGNOSIS — M5441 Lumbago with sciatica, right side: Secondary | ICD-10-CM | POA: Insufficient documentation

## 2024-02-02 DIAGNOSIS — M545 Low back pain, unspecified: Secondary | ICD-10-CM | POA: Diagnosis not present

## 2024-02-02 DIAGNOSIS — Z743 Need for continuous supervision: Secondary | ICD-10-CM | POA: Diagnosis not present

## 2024-02-02 NOTE — ED Triage Notes (Signed)
 Patient brought in via [facility] from home with complaints of worsening lumbar back pain. Patient has had this for a while and follows with pain specialist for it currently. Denies trauma or re injury. States it started when he got up to go to bed tonight. Denies loss of bowel/bladder control. No numbness or tingling.

## 2024-02-03 ENCOUNTER — Emergency Department
Admission: EM | Admit: 2024-02-03 | Discharge: 2024-02-03 | Disposition: A | Discharge status: Home / Self Care | Attending: Emergency Medicine | Admitting: Emergency Medicine

## 2024-02-03 ENCOUNTER — Other Ambulatory Visit

## 2024-02-03 ENCOUNTER — Emergency Department

## 2024-02-03 DIAGNOSIS — M47816 Spondylosis without myelopathy or radiculopathy, lumbar region: Secondary | ICD-10-CM | POA: Diagnosis not present

## 2024-02-03 DIAGNOSIS — M549 Dorsalgia, unspecified: Secondary | ICD-10-CM | POA: Diagnosis not present

## 2024-02-03 DIAGNOSIS — M5441 Lumbago with sciatica, right side: Secondary | ICD-10-CM

## 2024-02-03 DIAGNOSIS — M5136 Other intervertebral disc degeneration, lumbar region with discogenic back pain only: Secondary | ICD-10-CM | POA: Diagnosis not present

## 2024-02-03 DIAGNOSIS — M48061 Spinal stenosis, lumbar region without neurogenic claudication: Secondary | ICD-10-CM | POA: Diagnosis not present

## 2024-02-03 MED ORDER — OXYCODONE-ACETAMINOPHEN 5-325 MG PO TABS
2.0000 | ORAL_TABLET | Freq: Once | ORAL | Status: AC
  Administered 2024-02-03: 2 via ORAL
  Filled 2024-02-03: qty 2

## 2024-02-03 MED ORDER — KETOROLAC TROMETHAMINE 30 MG/ML IJ SOLN
30.0000 mg | Freq: Once | INTRAMUSCULAR | Status: AC
  Administered 2024-02-03: 30 mg via INTRAVENOUS

## 2024-02-03 MED ORDER — HYDROCODONE-ACETAMINOPHEN 5-325 MG PO TABS: 2.0000 | ORAL_TABLET | Freq: Four times a day (QID) | ORAL | 0 refills | Status: DC | PRN

## 2024-02-03 MED ORDER — LIDOCAINE 5 % EX PTCH
1.0000 | MEDICATED_PATCH | CUTANEOUS | Status: DC
  Filled 2024-02-03: qty 1

## 2024-02-03 MED ORDER — LIDOCAINE 5 % EX PTCH: 1.0000 | MEDICATED_PATCH | Freq: Two times a day (BID) | CUTANEOUS | 0 refills | Status: AC

## 2024-02-03 MED ORDER — KETOROLAC TROMETHAMINE 30 MG/ML IJ SOLN
30.0000 mg | Freq: Once | INTRAMUSCULAR | Status: DC
Start: 2024-02-03 — End: 2024-02-03
  Filled 2024-02-03: qty 1

## 2024-02-03 NOTE — ED Provider Notes (Signed)
 [facility] Provider Note    Event Date/Time   First MD Initiated Contact with Patient 02/03/24 [phone]     (approximate)   History   Back Pain   HPI James Maxwell is a 73 y.o. male who presents for evaluation of acute worsening of chronic lower back pain that radiates down his right leg.  He has had chronic back pain for a long time due to degenerative disc disease and arthritis.  He sees a physical therapist but does not go to a pain clinic.  He said that normally he can get by okay, but tonight he got up and was walking down the hall when suddenly he felt a stabbing pain in his right lower back and it radiates down the back of his right leg.  He has no numbness or weakness anywhere but it is difficult to walk due to the pain.  He said his gradually gotten worse to where he could not move very well.  He has no numbness in his pelvis, no bowel or bladder incontinence or retention, just the pain.     Physical Exam   Triage Vital Signs: ED Triage Vitals  Encounter Vitals Group     BP 02/02/24 2353 (!) 158/75     Girls Systolic BP Percentile --      Girls Diastolic BP Percentile --      Boys Systolic BP Percentile --      Boys Diastolic BP Percentile --      Pulse Rate 02/02/24 2353 77     Resp 02/02/24 2353 20     Temp 02/02/24 2353 98.3 F (36.8 C)     Temp Source 02/02/24 2353 Oral     SpO2 02/02/24 2352 99 %     Weight 02/02/24 2354 98 kg (216 lb)     Height 02/02/24 2354 1.753 m (5' 9)     Head Circumference --      Peak Flow --      Pain Score 02/02/24 2354 10     Pain Loc --      Pain Education --      Exclude from Growth Chart --     Most recent vital signs: Vitals:   02/02/24 2353 02/03/24 0333  BP: (!) 158/75 138/73  Pulse: 77 66  Resp: 20 18  Temp: 98.3 F (36.8 C) 97.6 F (36.4 C)  SpO2: 97% 96%    General: Awake, no distress at rest, obvious pain when he moves around. CV:  Good peripheral perfusion.  Easily palpable distal  pulse with equal pulses bilaterally. Resp:  Normal effort. Speaking easily and comfortably, no accessory muscle usage nor intercostal retractions.   Abd:  No distention.  Other:  Pain with right straight leg raise and with manipulation of the paraspinal muscles in the right lumbar spine.  No point tenderness to palpation of the lumbar spine.  No subjective numbness.  Difficult to assess weakness given the pain when he moves his leg.   ED Results / Procedures / Treatments   Labs (all labs ordered are listed, but only abnormal results are displayed) Labs Reviewed - No data to display     RADIOLOGY I independently viewed and interpreted the patient's lumbar spine x-rays and I see no evidence of acute fracture.  He has some chronic changes according to the radiologist but no emergent condition that is easily identifiable on plain x-rays.   PROCEDURES:  Critical Care performed: No  Procedures  IMPRESSION / MDM / ASSESSMENT AND PLAN / ED COURSE  I reviewed the triage vital signs and the nursing notes.                              Differential diagnosis includes, but is not limited to, sciatica, cauda equina, transverse myelitis, compression fracture.  Patient's presentation is most consistent with exacerbation of chronic illness.  Labs/studies ordered: Lumbar spine x-rays  Interventions/Medications given:  Medications  lidocaine  (LIDODERM ) 5 % 1 patch (1 patch Transdermal Patient Refused/Not Given 02/03/24 0507)  oxyCODONE -acetaminophen  (PERCOCET/ROXICET) 5-325 MG per tablet 2 tablet (2 tablets Oral Given 02/03/24 0504)  ketorolac  (TORADOL ) 30 MG/ML injection 30 mg (30 mg Intravenous Given 02/03/24 0505)    (Note:  hospital course my include additional interventions and/or labs/studies not listed above.)   Patient has acute exacerbation of his chronic back pain with sciatica.  No warning signs or symptoms of emergent/surgical cause of back pain.  Vital signs are stable.   Will try medications as listed above and I tried to set expectations that it is unlikely I will be able to remove his pain, just improve it.  I strongly encourage close follow-up with physiatry and he also has an appointment with a back specialist in about 2 weeks.    The patient's medical screening exam is reassuring with no indication of an emergent medical condition requiring hospitalization or additional evaluation at this point.  The patient is safe and appropriate for discharge and outpatient follow up.         FINAL CLINICAL IMPRESSION(S) / ED DIAGNOSES   Final diagnoses:  Acute right-sided low back pain with right-sided sciatica     Rx / DC Orders   ED Discharge Orders          Ordered    lidocaine  (LIDODERM ) 5 %  Every 12 hours        02/03/24 0521    HYDROcodone -acetaminophen  (NORCO/VICODIN) 5-325 MG tablet  Every 6 hours PRN        02/03/24 0521             Note:  This document was prepared using Dragon voice recognition software and may include unintentional dictation errors.   Gordan Huxley, MD 02/03/24 [phone]

## 2024-02-03 NOTE — Discharge Instructions (Signed)
You were evaluated in the Emergency Department today for back pain. Your evaluation suggests no acute abnormalities which require further intervention at this time.   - Move around as tolerated but avoiding heavy lifting. "Bed rest" is not recommended nor is it the best treatment for low back pain.  - Medications will help control your discomfort: -- Ibuprofen (800 mg every 8 hours for pain). -- Any other prescriptions you were provided as per label instructions -- Acetaminophen-hydrocodone (one to two tabs every 4 to 6 hours for pain).   -- Do not drink alcohol, drive a car, operate machinery, or get up on ladders or heights when taking any prescribed pain medications.  -- Do not drive home if you received prescribed pain medications here in the ED.  Please follow up with your primary care physician as needed or any other providers listed in this paperwork. If you do not have a primary doctor, you can call your insurance company to find one.  If you do not have insurance, you can go to the finance/registration department for more assistance.  Return to the ED immediately if you develop any of the following problems: -- Leaking urine or difficulty urinating; -- Inability to control your bowels; -- New numbness or weakness in your legs or numbness between your legs; -- Inability to walk -- Fever

## 2024-02-06 DIAGNOSIS — G8929 Other chronic pain: Secondary | ICD-10-CM | POA: Diagnosis not present

## 2024-02-06 DIAGNOSIS — M545 Low back pain, unspecified: Secondary | ICD-10-CM | POA: Diagnosis not present

## 2024-02-12 DIAGNOSIS — M545 Low back pain, unspecified: Secondary | ICD-10-CM | POA: Diagnosis not present

## 2024-02-12 DIAGNOSIS — G8929 Other chronic pain: Secondary | ICD-10-CM | POA: Diagnosis not present

## 2024-02-14 ENCOUNTER — Other Ambulatory Visit

## 2024-02-15 LAB — FRUCTOSAMINE: Fructosamine: 269 umol/L (ref 0–285)

## 2024-02-18 ENCOUNTER — Ambulatory Visit: Admitting: Internal Medicine

## 2024-02-18 ENCOUNTER — Encounter: Payer: Self-pay | Admitting: Internal Medicine

## 2024-02-18 VITALS — BP 122/73 | HR 74 | Temp 97.9°F | Ht 69.0 in | Wt 215.6 lb

## 2024-02-18 DIAGNOSIS — E1159 Type 2 diabetes mellitus with other circulatory complications: Secondary | ICD-10-CM

## 2024-02-18 DIAGNOSIS — L723 Sebaceous cyst: Secondary | ICD-10-CM | POA: Diagnosis not present

## 2024-02-18 LAB — POCT CBG (FASTING - GLUCOSE)-MANUAL ENTRY: Glucose Fasting, POC: 129 mg/dL — AB (ref 70–99)
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
## 2024-02-19 ENCOUNTER — Other Ambulatory Visit: Payer: Self-pay | Admitting: Interventional Cardiology

## 2024-02-20 DIAGNOSIS — M545 Low back pain, unspecified: Secondary | ICD-10-CM | POA: Diagnosis not present

## 2024-02-20 DIAGNOSIS — G8929 Other chronic pain: Secondary | ICD-10-CM | POA: Diagnosis not present

## 2024-02-21 DIAGNOSIS — M545 Low back pain, unspecified: Secondary | ICD-10-CM | POA: Diagnosis not present

## 2024-02-24 ENCOUNTER — Encounter: Payer: Self-pay | Admitting: Surgery

## 2024-02-24 ENCOUNTER — Ambulatory Visit: Admitting: Surgery

## 2024-02-24 VITALS — BP 159/83 | HR 74 | Wt 215.0 lb

## 2024-02-24 DIAGNOSIS — L723 Sebaceous cyst: Secondary | ICD-10-CM

## 2024-02-24 NOTE — Patient Instructions (Addendum)
 Stop your Plavix  for 5 days prior to your procedure. Your last dose will be on October 16th.   We will schedule you for an in office excision of this area. You may drive yourself but may also have someone with you in the room.   Excision of Skin Lesions Excision of a skin lesion is the removal of a section of skin by making small incisions in the skin. Through this process, the lesion is completely removed. This procedure is often done to treat or prevent cancer or infection. It may also be done to improve cosmetic appearance. You may have this procedure to remove: Cancerous (malignant) growths, such as basal cell carcinoma, squamous cell carcinoma, or melanoma. Noncancerous (benign) growths, such as a cyst or lipoma. Growths, such as moles or skin tags, which may be removed for cosmetic reasons. Various excision or surgical techniques may be used depending on your condition, the location of the lesion, and your overall health. Tell your health care provider about: Any allergies you have. All medicines you are taking, including vitamins, herbs, eye drops, creams, and over-the-counter medicines. Any problems you or family members have had with anesthetic medicines. Any bleeding problems you have. Any surgeries you have had. Any medical conditions you have. Whether you are pregnant or may be pregnant. What are the risks? Generally, this is a safe procedure. However, problems may occur, including: Bleeding. Infection. Scarring. Recurrence of the cyst, lipoma, or cancer. Allergic reaction to anesthetics, surgical materials, or ointments. Damage to nerves, blood vessels, muscles, or other structures. What happens before the procedure? Medicines Ask your health care provider about: Changing or stopping your regular medicines. This is especially important if you are taking diabetes medicines or blood thinners. Taking medicines such as aspirin  and ibuprofen. These medicines can thin your  blood. Do not take these medicines unless your health care provider tells you to take them. Taking over-the-counter medicines, vitamins, herbs, and supplements. General instructions Do not use any products that contain nicotine or tobacco. These products include cigarettes, chewing tobacco, and vaping devices, such as e-cigarettes. If you need help quitting, ask your health care provider. Follow instructions from your health care provider about eating or drinking restrictions. Ask your health care provider: How your surgery site will be marked. What steps will be taken to help prevent infection. These steps may include: Removing hair at the surgery site. Washing skin with a germ-killing soap. Taking antibiotic medicine. Ask your health care provider if you will need someone to take you home from the hospital or clinic after the procedure. What happens during the procedure?  You will be given a medicine to numb the area (local anesthetic). Your health care provider will remove the lesions using one of the following excision techniques. Complete surgical excision. This procedure may be done to treat a cancerous growth or a noncancerous cyst or lesion. A small scalpel or scissors will be used to gently cut around and under the lesion until it is completely removed. If bleeding occurs, it will be stopped with a device that delivers heat (electrocautery). The edges of the wound may be stitched (sutured) together. A bandage (dressing) will be applied. Samples will be sent to a lab for testing. Excision of a cyst. An incision will be made on the cyst. The entire cyst will be removed through the incision. The incision may be closed with sutures. Shave excision. This may be done to remove a mole or other small growths. A small blade or scalpel will  be used to shave off the lesion. The wound is usually left to heal on its own without sutures. The sample may be sent to a lab for testing. Punch  excision. This may be done to completely remove a mole or other small growths. A small tool that is like a cookie cutter or a hole punch is used to cut a circle shape out of the skin. The outer edges of the skin will be sutured together. The sample may be sent to a lab for testing. Mohs micrographic surgery. This is usually done to treat skin cancer. This type of excision is mostly used on the face and ears. This procedure is minimally invasive, and it ensures the best cosmetic outcome. A scalpel or a loop instrument will be used to remove layers of the lesion until all the abnormal or cancerous tissue has been removed. The wound may be sutured, depending on its size. The tissue will be checked under a microscope right away. The procedure may vary among health care providers and hospitals. At the end of any of these procedures, antibiotic ointment will be applied as needed. What happens after the procedure? Talk with your health care provider to discuss any test results, treatment options, and if necessary, the need for more tests. Keep all follow-up visits. This is important. Summary Excision of a skin lesion is the removal of a section of skin by making small incisions in the skin. This procedure is often done to treat or prevent skin cancer, remove benign growths, or it may be done to improve cosmetic appearance. Various excision or surgical techniques may be used depending on your condition, the location of the lesion, and your overall health. After the procedure, talk with your health care provider to discuss any test results, treatment options, and if necessary, the need for more tests. Keep all follow-up visits. This is important. This information is not intended to replace advice given to you by your health care provider. Make sure you discuss any questions you have with your health care provider. Document Revised: 12/05/2020 Document Reviewed: 12/06/2020 Elsevier Patient Education  2025  [employer].
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
## 2024-02-26 DIAGNOSIS — M545 Low back pain, unspecified: Secondary | ICD-10-CM | POA: Diagnosis not present

## 2024-03-02 DIAGNOSIS — M545 Low back pain, unspecified: Secondary | ICD-10-CM | POA: Diagnosis not present

## 2024-03-09 NOTE — Progress Notes (Signed)
 QUANTEZ SCHNYDER                                          MRN: [id number]   03/09/2024   The [facility] Quality Team Specialist reviewed this patient medical record for the purposes of chart review for care gap closure. The following were reviewed: chart review for care gap closure-kidney health evaluation for diabetes:eGFR  and uACR.    [facility]BCI Quality Team

## 2024-03-11 ENCOUNTER — Other Ambulatory Visit: Payer: Self-pay | Admitting: Surgery

## 2024-03-11 ENCOUNTER — Encounter: Payer: Self-pay | Admitting: Surgery

## 2024-03-11 ENCOUNTER — Ambulatory Visit: Admitting: Surgery

## 2024-03-11 VITALS — BP 136/84 | HR 76 | Temp 98.2°F | Ht 69.0 in | Wt 211.8 lb

## 2024-03-11 DIAGNOSIS — L723 Sebaceous cyst: Secondary | ICD-10-CM

## 2024-03-11 DIAGNOSIS — L720 Epidermal cyst: Secondary | ICD-10-CM | POA: Diagnosis not present

## 2024-03-11 NOTE — Patient Instructions (Signed)
 You may restart Plavix  on Friday 03/13/2024.   We have removed a Cyst in our office today.  You have sutures under the skin that will dissolve and also dermabond (skin glue) on top of your skin which will come off on it's own in 10-14 days.  You may use Ibuprofen or Tylenol  as needed for pain control. Use the ice pack 3-4 times a day for the next two days for any achiness.  You may shower 03/12/2024. Do not scrub at the area.   Avoid Strenuous activities that will make you sweat during the next 48 hours to avoid the glue coming off prematurely. Avoid activities that will place pressure to this area of the body for 1-2 weeks to avoid re-injury to incision site.  Please see your follow-up appointment provided. We will see you back in office to make sure this area is healed and to review the final pathology. If you have any questions or concerns prior to this appointment, call our office and speak with a nurse.    Excision of Skin Cysts or Lesions Excision of a skin lesion refers to the removal of a section of skin by making small cuts (incisions) in the skin. This procedure may be done to remove a cancerous (malignant) or noncancerous (benign) growth on the skin. It is typically done to treat or prevent cancer or infection. It may also be done to improve cosmetic appearance. The procedure may be done to remove: Cancerous growths, such as basal cell carcinoma, squamous cell carcinoma, or melanoma. Noncancerous growths, such as a cyst or lipoma. Growths, such as moles or skin tags, which may be removed for cosmetic reasons.  Various excision or surgical techniques may be used depending on your condition, the location of the lesion, and your overall health. Tell a health care provider about: Any allergies you have. All medicines you are taking, including vitamins, herbs, eye drops, creams, and over-the-counter medicines. Any problems you or family members have had with anesthetic  medicines. Any blood disorders you have. Any surgeries you have had. Any medical conditions you have. Whether you are pregnant or may be pregnant. What are the risks? Generally, this is a safe procedure. However, problems may occur, including: Bleeding. Infection. Scarring. Recurrence of the cyst, lipoma, or cancer. Changes in skin sensation or appearance, such as discoloration or swelling. Reaction to the anesthetics. Allergic reaction to surgical materials or ointments. Damage to nerves, blood vessels, muscles, or other structures. Continued pain.  What happens before the procedure? Ask your health care provider about: Changing or stopping your regular medicines. This is especially important if you are taking diabetes medicines or blood thinners. Taking medicines such as aspirin  and ibuprofen. These medicines can thin your blood. Do not take these medicines before your procedure if your health care provider instructs you not to. You may be asked to take certain medicines. You may be asked to stop smoking. You may have an exam or testing. What happens during the procedure? To reduce your risk of infection: Your health care team will wash or sanitize their hands. Your skin will be washed with soap. You will be given a medicine to numb the area (local anesthetic). One of the following excision techniques will be performed. At the end of any of these procedures, antibiotic ointment will be applied as needed. Each of the following techniques may vary among health care providers and hospitals. Complete Surgical Excision The area of skin that needs to be removed will be marked with  a pen. Using a small scalpel or scissors, the surgeon will gently cut around and under the lesion until it is completely removed. The lesion will be placed in a fluid and sent to the lab for examination. If necessary, bleeding will be controlled with a device that delivers heat (electrocautery). The edges of  the wound may be stitched (sutured) together, and a bandage (dressing) or surgical glue will be applied. This procedure may be performed to treat a cancerous growth or a noncancerous cyst or lesion.  What happens after the procedure? Return to your normal activities as told by your health care provider. Report any excessive bleeding, spreading redness, or increased pain.

## 2024-03-11 NOTE — Progress Notes (Signed)
  Procedure Date:  03/11/2024  Pre-operative Diagnosis:  Upper back sebaceous cyst  Post-operative Diagnosis:  Upper back sebaceous cyst, 2.7 cm  Procedure:  Excision of upper back sebaceous cyst; layered closure of 3.2 cm incision  Surgeon:  Aloysius Sheree Plant, MD  Anesthesia:  General endotracheal  Estimated Blood Loss:  5 ml  Specimens:  sebaceous cyst  Complications:  None  Indications for Procedure:  This is a 73 y.o. male with diagnosis of an upper back sebaceous cyst which has been previously flared-up.  The patient wishes to have this excised. The risks of bleeding, abscess or infection, injury to surrounding structures, and need for further procedures were all discussed with the patient and he was willing to proceed.  Description of Procedure: The patient was correctly identified at bedside.  The patient was placed supine.  Appropriate time-outs were performed.  The patient's upper back was prepped and draped in usual sterile fashion.  Local anesthetic was infused intradermally.  A 3.2 cm elliptical incision was made over the cyst, and scalpel was used to dissect down the skin to reach the subcutaneous tissue.  Skin flaps were created sharply around the cyst, and then the cyst was excised intact.  It was sent off to pathology.  The cavity was then irrigated and hemostasis was assured with a 3-0 Vicryl at the base of the wound cavity.  The wound was then closed in two layers using 3-0 Vicryl and 4-0 Monocryl.  The incision was cleaned and sealed with DermaBond.  The patient tolerated the procedure well and all sharps were appropriately disposed of at the end of the case.  --Patient may shower tomorrow. --May take Tylenol  or ibuprofen for pain control. --Resume Plavix  on 03/13/24 --Follow up in 10 days.  Aloysius Sheree Plant, MD

## 2024-03-16 LAB — DERMATOLOGY PATHOLOGY

## 2024-03-20 ENCOUNTER — Encounter: Payer: Self-pay | Admitting: Surgery

## 2024-03-20 ENCOUNTER — Ambulatory Visit: Admitting: Surgery

## 2024-03-20 VITALS — BP 127/72 | HR 75 | Ht 69.0 in | Wt 231.0 lb

## 2024-03-20 DIAGNOSIS — L723 Sebaceous cyst: Secondary | ICD-10-CM

## 2024-03-20 DIAGNOSIS — Z09 Encounter for follow-up examination after completed treatment for conditions other than malignant neoplasm: Secondary | ICD-10-CM

## 2024-03-20 NOTE — Patient Instructions (Signed)
   Follow-up with our office as needed.  Please call and ask to speak with a nurse if you develop questions or concerns.

## 2024-03-20 NOTE — Progress Notes (Signed)
 03/20/2024  HPI: James Maxwell is a 73 y.o. male s/p excision of upper back sebaceous cyst. Patient presents for follow up.  Reports he's been doing well.  Reports itching at the incision but otherwise no other issues.  Vital signs: BP 127/72   Pulse 75   Ht 5' 9 (1.753 m)   Wt 231 lb (104.8 kg)   SpO2 98%   BMI 34.11 kg/m    Physical Exam: Constitutional: No acute distress Skin:  Upper back incision is healing well, clean, dry, intact.  DermaBond peeling off.    Assessment/Plan: This is a 73 y.o. male s/p excision of upper back sebaceous cyst.  --Discussed pathology findings showing epidermal inclusion cyst.   --Patient is healing well, no issues. --Recommended he can try benadryl  ointment to help with the itching. --Follow up as needed.   Aloysius Sheree Plant, MD [facility]

## 2024-03-22 ENCOUNTER — Other Ambulatory Visit: Payer: Self-pay | Admitting: Internal Medicine

## 2024-03-22 DIAGNOSIS — E1159 Type 2 diabetes mellitus with other circulatory complications: Secondary | ICD-10-CM

## 2024-03-24 NOTE — Progress Notes (Unsigned)
 Cardiology Office Note:   Date:  03/25/2024  ID:  RUBY DILONE, DOB 1950-08-18, MRN [id number] PCP:  Albina GORMAN Dine, MD  [facility] Providers Cardiologist:  Wendel Haws, MD Referring MD: Albina GORMAN Dine, MD  Chief Complaint/Reason for Referral: Follow-up coronary artery disease ASSESSMENT:    1. Coronary artery disease of native artery of native heart with stable angina pectoris   2. AVNRT (AV nodal re-entry tachycardia)   3. Diastolic dysfunction with chronic heart failure (HCC)   4. Type 2 diabetes mellitus with complication, with long-term current use of insulin  (HCC)   5. Hypertension associated with diabetes (HCC)   6. Hyperlipidemia associated with type 2 diabetes mellitus (HCC)   7. Aortic atherosclerosis   8. CKD stage 2 due to type 2 diabetes mellitus (HCC)   9. BMI 39.0-39.9,adult     PLAN:   In order of problems listed above: CAD: Continue Plavix  75 mg, Coreg  25 mg twice daily rosuvastatin  20 mg, start as needed nitroglycerin . AVNRT: Status post ablation. Diastolic dysfunction: Continue Coreg  25 mg twice daily, intolerant of SGLT2 inhibitors, consider ARB in the future T2DM: Continue Plavix  75 mg daily, intolerant of SGLT2 inhibitors,  continue rosuvastatin  20 mg daily Hypertension: Continue Coreg  25 mg twice daily.  Blood pressure well-controlled today.  If additional blood pressure control is required consider losartan . Hyperlipidemia: Continue rosuvastatin  20 mg.  LDL in August was 34.  Goal LDL is less than 55 given elevated LP(a).  Will check hs-CRP today.  If over 2, will have patient return to discuss low-dose colchicine. Aortic atherosclerosis: Continue Plavix  75 mg, rosuvastatin  20 mg. CKD stage II: Intolerant of SGLT2 inhibitors.  If becomes hypertensive would start ARB.  CKD stage II not a contraindication to low-dose colchicine if hs-CRP is elevated. Elevated BMI: Continue Mounjaro  12.5 mg weekly            Dispo:  Return in about 1 year  (around 03/25/2025).       I spent 32 minutes reviewing all clinical data during and prior to this visit including all relevant imaging studies, laboratories, clinical information from other health systems and prior notes from both Cardiology and other specialties, interviewing the patient, conducting a complete physical examination, and coordinating care in order to formulate a comprehensive and personalized evaluation and treatment plan.   History of Present Illness:    FOCUSED PROBLEM LIST:   CAD POBA OM 1998 BMS mRCA 2005 DES mRCA for ISR 2018 AVNRT Status post ablation 2020 Diastolic dysfunction G1 DD, no significant valve issues, EF 60 to 65% TTE 2023 T2DM On insulin  Intolerant of Jardiance  >> GI distress Hypertension Hyperlipidemia LP(a) 259 Intolerant of atorvastatin  Aortic atherosclerosis Chest CT 2022 CKD stage II OSA On CPAP BMI 39  September 2024: The patient is a 73 y.o. male with the indicated medical history here for routine cardiology follow-up.  He was last seen in clinic in July of last year and was doing relatively well.  The patient continues to do well.  He is retired and has been for about 14 years.  He does not do much in a day but he is able to do most of his activities of daily living without any issues.  He has had no problems with his medications.  He denies any shortness of breath, exertional angina, presyncope, or syncope.  He does get some occasional dependent lower extremity edema that resolves with his daily Lasix .  He has had no severe bleeding or bruising episodes and  denies any signs or symptoms of stroke.  He is otherwise well without complaints.  Plan: Stop aspirin  and continue Plavix  monotherapy, check lipid panel, LFTs, LP(a), start spironolactone  25 mg, refer to Pharm.D. for GLP-1 receptor agonist therapy.  November 2025:  Patient consents to use of AI scribe. The patient returns for routine follow-up.  He was last seen in March of this year.   His blood pressure was elevated and therefore Coreg  was increased to 25 mg twice daily.  He has experienced significant weight loss, approximately 50 pounds, since starting Mounjaro , with his weight decreasing from 268 pounds to around 210-214 pounds. He feels better overall with this weight loss and reports that his diabetes management is going well.  No chest pain, shortness of breath, or swelling in the legs. He has not required any hospital visits for heart-related issues. He reports no problems with his current medications and is unsure if he needs any refills, mentioning that he might need to contact his family doctor for a refill.  He recalls a previous experience with the medication Jardiance , which was discontinued due to stomach pains and a subsequent bacterial infection.       Current Medications: Current Meds  Medication Sig   ACCU-CHEK AVIVA PLUS test strip TEST 3 TIMES A DAY BEFORE MEALS OR LAST TEST AT BEDTIME   ACCU-CHEK SOFTCLIX LANCETS lancets    Alcohol  Swabs (B-D SINGLE USE SWABS REGULAR) PADS    B-D ULTRAFINE III SHORT PEN 31G X 8 MM MISC SMARTSIG:injection Daily   BD PEN NEEDLE MINI ULTRAFINE 31G X 5 MM MISC USE TO INJECT INSULIN  DAILY   carvedilol  (COREG ) 25 MG tablet Take 1 tablet (25 mg total) by mouth 2 (two) times daily.   clopidogrel  (PLAVIX ) 75 MG tablet TAKE 1 TABLET BY MOUTH EVERY DAY   Continuous Glucose Receiver (FREESTYLE LIBRE 3 READER) DEVI 1 Device by Does not apply route daily.   Continuous Glucose Sensor (FREESTYLE LIBRE 3 PLUS SENSOR) MISC Change sensor every 15 days.   insulin  lispro (HUMALOG  KWIKPEN) 100 UNIT/ML KwikPen Sliding scale max 30 units/day   lidocaine  (LIDODERM ) 5 % Place 1 patch onto the skin every 12 (twelve) hours. Remove & Discard patch within 12 hours or as directed by MD.  Ginnie the patch off for 12 hours before applying a new one.   metFORMIN  (GLUCOPHAGE ) 1000 MG tablet Take 1 tablet (1,000 mg total) by mouth daily with breakfast.    montelukast (SINGULAIR) 10 MG tablet TAKE 1 TABLET BY MOUTH EVERY DAY IN THE MORNING   nitroGLYCERIN  (NITROSTAT ) 0.4 MG SL tablet Place 1 tablet (0.4 mg total) under the tongue every 5 (five) minutes as needed for chest pain. DO NOT EXCEED MORE THAN 3 TABLETS PER EPISODE.   pantoprazole  (PROTONIX ) 40 MG tablet TAKE 1 TABLET BY MOUTH EVERY DAY   rosuvastatin  (CRESTOR ) 20 MG tablet TAKE 1 TABLET BY MOUTH EVERY DAY   tirzepatide  (MOUNJARO ) 12.5 MG/0.5ML Pen INJECT 12.5 MG SUBCUTANEOUSLY ONE TIME PER WEEK   Zinc  50 MG TABS Take 50 mg by mouth daily.     Review of Systems:   Please see the history of present illness.    All other systems reviewed and are negative.     EKGs/Labs/Other Test Reviewed:   EKG: 2025 normal sinus rhythm with PACs and PVCs  EKG Interpretation Date/Time:    Ventricular Rate:    PR Interval:    QRS Duration:    QT Interval:    QTC Calculation:  R Axis:      Text Interpretation:          CARDIAC STUDIES: Refer to CV Procedures and Imaging Tabs   Risk Assessment/Calculations:          Physical Exam:   VS:  BP 110/60 (BP Location: Right Arm, Patient Position: Sitting, Cuff Size: Large)   Pulse 72   Ht 5' 9 (1.753 m)   Wt 214 lb 3.2 oz (97.2 kg)   SpO2 95%   BMI 31.63 kg/m        Wt Readings from Last 3 Encounters:  03/25/24 214 lb 3.2 oz (97.2 kg)  03/20/24 231 lb (104.8 kg)  03/11/24 211 lb 12.8 oz (96.1 kg)      GENERAL:  No apparent distress, AOx3 HEENT:  No carotid bruits, +2 carotid impulses, no scleral icterus CAR: RRR no murmurs, gallops, rubs, or thrills RES:  Clear to auscultation bilaterally ABD:  Soft, nontender, nondistended, positive bowel sounds x 4 VASC:  +2 radial pulses, +2 carotid pulses NEURO:  CN 2-12 grossly intact; motor and sensory grossly intact PSYCH:  No active depression or anxiety EXT:  No edema, ecchymosis, or cyanosis  Signed, Jaylise Peek K Tylik Treese, MD  03/25/2024 8:37 AM    [facility]  [facility] [address] [place], [place], [place]  [address] Phone: [phone]; Fax: [phone]   Note:  This document was prepared using Dragon voice recognition software and may include unintentional dictation errors.

## 2024-03-25 ENCOUNTER — Encounter: Payer: Self-pay | Admitting: Internal Medicine

## 2024-03-25 ENCOUNTER — Ambulatory Visit: Attending: Internal Medicine | Admitting: Internal Medicine

## 2024-03-25 VITALS — BP 110/60 | HR 72 | Ht 69.0 in | Wt 214.2 lb

## 2024-03-25 DIAGNOSIS — I25118 Atherosclerotic heart disease of native coronary artery with other forms of angina pectoris: Secondary | ICD-10-CM | POA: Diagnosis not present

## 2024-03-25 DIAGNOSIS — I4719 Other supraventricular tachycardia: Secondary | ICD-10-CM | POA: Diagnosis not present

## 2024-03-25 DIAGNOSIS — E118 Type 2 diabetes mellitus with unspecified complications: Secondary | ICD-10-CM | POA: Diagnosis not present

## 2024-03-25 DIAGNOSIS — I700 Atherosclerosis of aorta: Secondary | ICD-10-CM

## 2024-03-25 DIAGNOSIS — E1122 Type 2 diabetes mellitus with diabetic chronic kidney disease: Secondary | ICD-10-CM

## 2024-03-25 DIAGNOSIS — I152 Hypertension secondary to endocrine disorders: Secondary | ICD-10-CM

## 2024-03-25 DIAGNOSIS — Z6839 Body mass index (BMI) 39.0-39.9, adult: Secondary | ICD-10-CM

## 2024-03-25 DIAGNOSIS — Z794 Long term (current) use of insulin: Secondary | ICD-10-CM

## 2024-03-25 DIAGNOSIS — N182 Chronic kidney disease, stage 2 (mild): Secondary | ICD-10-CM

## 2024-03-25 DIAGNOSIS — E1169 Type 2 diabetes mellitus with other specified complication: Secondary | ICD-10-CM

## 2024-03-25 DIAGNOSIS — I5032 Chronic diastolic (congestive) heart failure: Secondary | ICD-10-CM

## 2024-03-25 DIAGNOSIS — E1159 Type 2 diabetes mellitus with other circulatory complications: Secondary | ICD-10-CM

## 2024-03-25 DIAGNOSIS — E785 Hyperlipidemia, unspecified: Secondary | ICD-10-CM

## 2024-03-25 MED ORDER — NITROGLYCERIN 0.4 MG SL SUBL: 0.4000 mg | SUBLINGUAL_TABLET | SUBLINGUAL | 3 refills | Status: AC | PRN

## 2024-03-25 NOTE — Patient Instructions (Signed)
 Medication Instructions:  AS NEEDED - Nitroglycerin  0.4 mg SL The proper use and anticipated side effects of nitroglycerine has been carefully explained.  If a single episode of chest pain is not relieved by one tablet, the patient will try another within 5 minutes; and if this doesn't relieve the pain, the patient is instructed to call 911 for transportation to an emergency department.  *If you need a refill on your cardiac medications before your next appointment, please call your pharmacy*  Lab Work: To be completed today: hs-CRP  If you have labs (blood work) drawn today and your tests are completely normal, you will receive your results only by: MyChart Message (if you have MyChart) OR A paper copy in the mail If you have any lab test that is abnormal or we need to change your treatment, we will call you to review the results.  Testing/Procedures: None ordered today.  Follow-Up: At [facility], you and your health needs are our priority.  As part of our continuing mission to provide you with exceptional heart care, our providers are all part of one team.  This team includes your primary Cardiologist (physician) and Advanced Practice Providers or APPs (Physician Assistants and Nurse Practitioners) who all work together to provide you with the care you need, when you need it.  Your next appointment:   1 year(s)  Provider:   Glendia Ferrier, PA-C

## 2024-03-26 ENCOUNTER — Ambulatory Visit: Payer: Self-pay | Admitting: Internal Medicine

## 2024-03-26 LAB — HIGH SENSITIVITY CRP: CRP, High Sensitivity: 0.64 mg/L (ref 0.00–3.00)

## 2024-04-03 ENCOUNTER — Other Ambulatory Visit

## 2024-04-03 DIAGNOSIS — E1169 Type 2 diabetes mellitus with other specified complication: Secondary | ICD-10-CM

## 2024-04-03 DIAGNOSIS — E860 Dehydration: Secondary | ICD-10-CM

## 2024-04-03 DIAGNOSIS — E1159 Type 2 diabetes mellitus with other circulatory complications: Secondary | ICD-10-CM

## 2024-04-03 DIAGNOSIS — N179 Acute kidney failure, unspecified: Secondary | ICD-10-CM

## 2024-04-04 LAB — COMPREHENSIVE METABOLIC PANEL WITH GFR
ALT: 16 IU/L (ref 0–44)
AST: 28 IU/L (ref 0–40)
Albumin: 4.2 g/dL (ref 3.8–4.8)
Alkaline Phosphatase: 71 IU/L (ref 47–123)
BUN/Creatinine Ratio: 16 (ref 10–24)
BUN: 19 mg/dL (ref 8–27)
Bilirubin Total: 1.3 mg/dL — ABNORMAL HIGH (ref 0.0–1.2)
CO2: 21 mmol/L (ref 20–29)
Calcium: 9.4 mg/dL (ref 8.6–10.2)
Chloride: 102 mmol/L (ref 96–106)
Creatinine, Ser: 1.19 mg/dL (ref 0.76–1.27)
Globulin, Total: 2.7 g/dL (ref 1.5–4.5)
Glucose: 141 mg/dL — ABNORMAL HIGH (ref 70–99)
Potassium: 4.1 mmol/L (ref 3.5–5.2)
Sodium: 139 mmol/L (ref 134–144)
Total Protein: 6.9 g/dL (ref 6.0–8.5)
eGFR: 64 mL/min/1.73 (ref >59)

## 2024-04-04 LAB — LIPID PANEL
Chol/HDL Ratio: 2.2 ratio (ref 0.0–5.0)
Cholesterol, Total: 111 mg/dL (ref 100–199)
HDL: 51 mg/dL (ref >39)
LDL Chol Calc (NIH): 41 mg/dL (ref 0–99)
Triglycerides: 103 mg/dL (ref 0–149)
VLDL Cholesterol Cal: 19 mg/dL (ref 5–40)

## 2024-04-04 LAB — HEMOGLOBIN A1C
Est. average glucose Bld gHb Est-mCnc: 146 mg/dL
Hgb A1c MFr Bld: 6.7 % — ABNORMAL HIGH (ref 4.8–5.6)

## 2024-04-07 ENCOUNTER — Ambulatory Visit: Payer: Self-pay | Admitting: Internal Medicine

## 2024-04-07 ENCOUNTER — Other Ambulatory Visit: Payer: Self-pay | Admitting: Internal Medicine

## 2024-04-07 ENCOUNTER — Encounter: Payer: Self-pay | Admitting: Internal Medicine

## 2024-04-07 ENCOUNTER — Ambulatory Visit (INDEPENDENT_AMBULATORY_CARE_PROVIDER_SITE_OTHER): Admitting: Internal Medicine

## 2024-04-07 VITALS — BP 113/76 | HR 68 | Temp 97.9°F | Ht 69.0 in | Wt 212.4 lb

## 2024-04-07 DIAGNOSIS — E119 Type 2 diabetes mellitus without complications: Secondary | ICD-10-CM

## 2024-04-07 DIAGNOSIS — E1159 Type 2 diabetes mellitus with other circulatory complications: Secondary | ICD-10-CM

## 2024-04-07 DIAGNOSIS — Z0130 Encounter for examination of blood pressure without abnormal findings: Secondary | ICD-10-CM

## 2024-04-07 DIAGNOSIS — E1165 Type 2 diabetes mellitus with hyperglycemia: Secondary | ICD-10-CM | POA: Diagnosis not present

## 2024-04-07 LAB — POCT CBG (FASTING - GLUCOSE)-MANUAL ENTRY: Glucose Fasting, POC: 135 mg/dL — AB (ref 70–99)

## 2024-04-07 MED ORDER — METFORMIN HCL 1000 MG PO TABS: 1000.0000 mg | ORAL_TABLET | Freq: Every day | ORAL | 0 refills | Status: AC

## 2024-04-07 MED ORDER — MOUNJARO 15 MG/0.5ML ~~LOC~~ SOAJ: 15.0000 mg | SUBCUTANEOUS | 2 refills | Status: DC

## 2024-04-07 MED ORDER — INSULIN LISPRO (1 UNIT DIAL) 100 UNIT/ML (KWIKPEN): PEN_INJECTOR | SUBCUTANEOUS | 2 refills | Status: AC
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
## 2024-04-19 ENCOUNTER — Other Ambulatory Visit: Payer: Self-pay | Admitting: Internal Medicine

## 2024-04-19 DIAGNOSIS — S73101D Unspecified sprain of right hip, subsequent encounter: Secondary | ICD-10-CM

## 2024-05-11 ENCOUNTER — Ambulatory Visit: Admitting: Internal Medicine

## 2024-05-11 VITALS — BP 120/70 | HR 72 | Temp 97.9°F | Ht 69.0 in | Wt 210.0 lb

## 2024-05-11 DIAGNOSIS — Z794 Long term (current) use of insulin: Secondary | ICD-10-CM | POA: Diagnosis not present

## 2024-05-11 DIAGNOSIS — Z0001 Encounter for general adult medical examination with abnormal findings: Secondary | ICD-10-CM | POA: Diagnosis not present

## 2024-05-11 DIAGNOSIS — N521 Erectile dysfunction due to diseases classified elsewhere: Secondary | ICD-10-CM | POA: Diagnosis not present

## 2024-05-11 DIAGNOSIS — I10 Essential (primary) hypertension: Secondary | ICD-10-CM | POA: Diagnosis not present

## 2024-05-11 DIAGNOSIS — Z739 Problem related to life management difficulty, unspecified: Secondary | ICD-10-CM

## 2024-05-11 DIAGNOSIS — Z1331 Encounter for screening for depression: Secondary | ICD-10-CM

## 2024-05-11 DIAGNOSIS — E119 Type 2 diabetes mellitus without complications: Secondary | ICD-10-CM

## 2024-05-11 DIAGNOSIS — E1165 Type 2 diabetes mellitus with hyperglycemia: Secondary | ICD-10-CM

## 2024-05-11 LAB — POCT CBG (FASTING - GLUCOSE)-MANUAL ENTRY: Glucose Fasting, POC: 127 mg/dL — AB (ref 70–99)
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
## 2024-05-17 ENCOUNTER — Other Ambulatory Visit: Payer: Self-pay | Admitting: Internal Medicine

## 2024-05-21 ENCOUNTER — Other Ambulatory Visit: Payer: Self-pay | Admitting: Internal Medicine

## 2024-05-21 DIAGNOSIS — S73101D Unspecified sprain of right hip, subsequent encounter: Secondary | ICD-10-CM

## 2024-05-26 DIAGNOSIS — E1159 Type 2 diabetes mellitus with other circulatory complications: Secondary | ICD-10-CM

## 2024-05-26 MED ORDER — MOUNJARO 15 MG/0.5ML ~~LOC~~ SOAJ: 15.0000 mg | SUBCUTANEOUS | 1 refills | Status: AC

## 2024-06-02 ENCOUNTER — Telehealth: Payer: Self-pay

## 2024-06-02 NOTE — Telephone Encounter (Signed)
 10:28a pt LM stating that he can no longer afford Mounjaro  as its costing him $900, can we send him in something cheaper

## 2024-06-04 ENCOUNTER — Encounter: Payer: Self-pay | Admitting: Internal Medicine

## 2024-06-04 ENCOUNTER — Ambulatory Visit: Admitting: Internal Medicine

## 2024-06-13 ENCOUNTER — Other Ambulatory Visit: Payer: Self-pay | Admitting: Internal Medicine

## 2024-06-13 DIAGNOSIS — J301 Allergic rhinitis due to pollen: Secondary | ICD-10-CM

## 2024-06-15 NOTE — Progress Notes (Signed)
 James Maxwell                                          MRN: [id number]   06/15/2024   The [facility] Quality Team Specialist reviewed this patient medical record for the purposes of chart review for care gap closure. The following were reviewed: abstraction for care gap closure-glycemic status assessment.    [facility] Quality Team

## 2024-07-15 ENCOUNTER — Ambulatory Visit: Admitting: Internal Medicine

## 2024-07-31 ENCOUNTER — Encounter (INDEPENDENT_AMBULATORY_CARE_PROVIDER_SITE_OTHER): Admitting: Ophthalmology

## 2024-08-12 ENCOUNTER — Ambulatory Visit: Admitting: Internal Medicine
